# Patient Record
Sex: Female | Born: 1935 | Race: White | Hispanic: No | Smoking: Never smoker
Health system: Southern US, Community
[De-identification: ages and names within clinical notes are randomized; demographics above are authoritative.]

## PROBLEM LIST (undated history)

## (undated) DIAGNOSIS — D649 Anemia, unspecified: Secondary | ICD-10-CM

## (undated) DIAGNOSIS — I739 Peripheral vascular disease, unspecified: Secondary | ICD-10-CM

## (undated) DIAGNOSIS — I219 Acute myocardial infarction, unspecified: Secondary | ICD-10-CM

## (undated) DIAGNOSIS — E785 Hyperlipidemia, unspecified: Secondary | ICD-10-CM

## (undated) DIAGNOSIS — I10 Essential (primary) hypertension: Secondary | ICD-10-CM

## (undated) DIAGNOSIS — E039 Hypothyroidism, unspecified: Secondary | ICD-10-CM

## (undated) DIAGNOSIS — J45909 Unspecified asthma, uncomplicated: Secondary | ICD-10-CM

## (undated) DIAGNOSIS — Z8673 Personal history of transient ischemic attack (TIA), and cerebral infarction without residual deficits: Secondary | ICD-10-CM

## (undated) DIAGNOSIS — H9190 Unspecified hearing loss, unspecified ear: Secondary | ICD-10-CM

## (undated) DIAGNOSIS — I2510 Atherosclerotic heart disease of native coronary artery without angina pectoris: Secondary | ICD-10-CM

## (undated) DIAGNOSIS — E119 Type 2 diabetes mellitus without complications: Secondary | ICD-10-CM

## (undated) DIAGNOSIS — I4891 Unspecified atrial fibrillation: Secondary | ICD-10-CM

## (undated) DIAGNOSIS — I5032 Chronic diastolic (congestive) heart failure: Secondary | ICD-10-CM

## (undated) DIAGNOSIS — I779 Disorder of arteries and arterioles, unspecified: Secondary | ICD-10-CM

## (undated) DIAGNOSIS — J309 Allergic rhinitis, unspecified: Secondary | ICD-10-CM

## (undated) DIAGNOSIS — K219 Gastro-esophageal reflux disease without esophagitis: Secondary | ICD-10-CM

## (undated) DIAGNOSIS — F0390 Unspecified dementia without behavioral disturbance: Secondary | ICD-10-CM

## (undated) HISTORY — PX: ROTATOR CUFF REPAIR: SHX139

## (undated) HISTORY — DX: Allergic rhinitis, unspecified: J30.9

## (undated) HISTORY — DX: Gastro-esophageal reflux disease without esophagitis: K21.9

## (undated) HISTORY — PX: OTHER SURGICAL HISTORY: SHX169

## (undated) HISTORY — PX: RECTOCELE REPAIR: SHX761

## (undated) HISTORY — DX: Unspecified atrial fibrillation: I48.91

## (undated) HISTORY — DX: Essential (primary) hypertension: I10

## (undated) HISTORY — PX: HEMORRHOID SURGERY: SHX153

## (undated) HISTORY — DX: Unspecified hearing loss, unspecified ear: H91.90

## (undated) HISTORY — PX: DILATION AND CURETTAGE OF UTERUS: SHX78

## (undated) HISTORY — PX: EYE SURGERY: SHX253

## (undated) HISTORY — PX: TONSILLECTOMY: SUR1361

## (undated) HISTORY — PX: ABDOMINAL HYSTERECTOMY: SHX81

## (undated) HISTORY — DX: Type 2 diabetes mellitus without complications: E11.9

## (undated) HISTORY — DX: Acute myocardial infarction, unspecified: I21.9

## (undated) HISTORY — PX: PARTIAL HYSTERECTOMY: SHX80

---

## 1998-01-11 ENCOUNTER — Inpatient Hospital Stay (HOSPITAL_COMMUNITY): Admission: AD | Admit: 1998-01-11 | Discharge: 1998-01-12 | Payer: Self-pay | Admitting: Orthopedic Surgery

## 1998-06-07 ENCOUNTER — Other Ambulatory Visit: Admission: RE | Admit: 1998-06-07 | Discharge: 1998-06-07 | Payer: Self-pay | Admitting: Obstetrics and Gynecology

## 1998-09-09 HISTORY — PX: CORONARY ARTERY BYPASS GRAFT: SHX141

## 1999-06-13 ENCOUNTER — Other Ambulatory Visit: Admission: RE | Admit: 1999-06-13 | Discharge: 1999-06-13 | Payer: Self-pay | Admitting: Obstetrics and Gynecology

## 1999-09-06 ENCOUNTER — Inpatient Hospital Stay (HOSPITAL_COMMUNITY): Admission: AD | Admit: 1999-09-06 | Discharge: 1999-09-18 | Payer: Self-pay | Admitting: Cardiology

## 1999-09-07 ENCOUNTER — Encounter: Payer: Self-pay | Admitting: Cardiology

## 1999-09-07 ENCOUNTER — Encounter: Payer: Self-pay | Admitting: Cardiothoracic Surgery

## 1999-09-07 HISTORY — PX: CARDIAC CATHETERIZATION: SHX172

## 1999-09-08 ENCOUNTER — Encounter: Payer: Self-pay | Admitting: Cardiothoracic Surgery

## 1999-09-09 ENCOUNTER — Encounter: Payer: Self-pay | Admitting: Cardiothoracic Surgery

## 1999-09-10 ENCOUNTER — Encounter: Payer: Self-pay | Admitting: Cardiothoracic Surgery

## 1999-09-11 ENCOUNTER — Encounter: Payer: Self-pay | Admitting: General Surgery

## 1999-09-12 ENCOUNTER — Encounter: Payer: Self-pay | Admitting: Cardiothoracic Surgery

## 1999-09-16 ENCOUNTER — Encounter: Payer: Self-pay | Admitting: Thoracic Surgery (Cardiothoracic Vascular Surgery)

## 1999-09-28 ENCOUNTER — Encounter: Payer: Self-pay | Admitting: Cardiothoracic Surgery

## 1999-09-28 ENCOUNTER — Encounter: Admission: RE | Admit: 1999-09-28 | Discharge: 1999-09-28 | Payer: Self-pay | Admitting: Cardiothoracic Surgery

## 1999-10-06 ENCOUNTER — Encounter: Payer: Self-pay | Admitting: Emergency Medicine

## 1999-10-06 ENCOUNTER — Inpatient Hospital Stay (HOSPITAL_COMMUNITY): Admission: EM | Admit: 1999-10-06 | Discharge: 1999-10-11 | Payer: Self-pay | Admitting: *Deleted

## 1999-10-09 ENCOUNTER — Encounter: Payer: Self-pay | Admitting: Cardiology

## 2000-03-11 ENCOUNTER — Encounter: Payer: Self-pay | Admitting: Orthopedic Surgery

## 2000-03-14 ENCOUNTER — Inpatient Hospital Stay (HOSPITAL_COMMUNITY): Admission: RE | Admit: 2000-03-14 | Discharge: 2000-03-16 | Payer: Self-pay | Admitting: Orthopedic Surgery

## 2000-08-06 ENCOUNTER — Other Ambulatory Visit: Admission: RE | Admit: 2000-08-06 | Discharge: 2000-08-06 | Payer: Self-pay | Admitting: Obstetrics and Gynecology

## 2001-10-10 HISTORY — PX: CHOLECYSTECTOMY: SHX55

## 2001-10-29 ENCOUNTER — Encounter: Payer: Self-pay | Admitting: General Surgery

## 2001-10-29 ENCOUNTER — Observation Stay (HOSPITAL_COMMUNITY): Admission: RE | Admit: 2001-10-29 | Discharge: 2001-10-30 | Payer: Self-pay | Admitting: General Surgery

## 2001-10-29 ENCOUNTER — Encounter (INDEPENDENT_AMBULATORY_CARE_PROVIDER_SITE_OTHER): Payer: Self-pay | Admitting: Specialist

## 2004-02-13 ENCOUNTER — Encounter: Payer: Self-pay | Admitting: Family Medicine

## 2004-02-13 LAB — CONVERTED CEMR LAB: Hgb A1c MFr Bld: 5.1 %

## 2004-08-24 ENCOUNTER — Ambulatory Visit: Payer: Self-pay | Admitting: Family Medicine

## 2004-08-28 ENCOUNTER — Ambulatory Visit: Payer: Self-pay | Admitting: Family Medicine

## 2005-01-15 ENCOUNTER — Ambulatory Visit: Payer: Self-pay | Admitting: Internal Medicine

## 2005-01-17 HISTORY — PX: US ECHOCARDIOGRAPHY: HXRAD669

## 2005-02-28 ENCOUNTER — Ambulatory Visit: Payer: Self-pay | Admitting: Family Medicine

## 2005-07-17 ENCOUNTER — Ambulatory Visit: Payer: Self-pay | Admitting: Family Medicine

## 2005-07-24 ENCOUNTER — Ambulatory Visit: Payer: Self-pay | Admitting: Family Medicine

## 2005-08-07 ENCOUNTER — Ambulatory Visit: Payer: Self-pay | Admitting: Internal Medicine

## 2005-08-15 ENCOUNTER — Ambulatory Visit: Payer: Self-pay | Admitting: Internal Medicine

## 2006-02-20 ENCOUNTER — Ambulatory Visit: Payer: Self-pay | Admitting: Internal Medicine

## 2006-03-10 ENCOUNTER — Ambulatory Visit: Payer: Self-pay | Admitting: Family Medicine

## 2006-08-14 ENCOUNTER — Ambulatory Visit: Payer: Self-pay | Admitting: Internal Medicine

## 2006-08-26 ENCOUNTER — Ambulatory Visit: Payer: Self-pay | Admitting: Family Medicine

## 2006-10-17 ENCOUNTER — Encounter: Payer: Self-pay | Admitting: Family Medicine

## 2006-10-17 LAB — CONVERTED CEMR LAB: TSH: 0.938 microintl units/mL

## 2006-10-21 ENCOUNTER — Ambulatory Visit: Payer: Self-pay | Admitting: Internal Medicine

## 2007-06-23 ENCOUNTER — Encounter: Admission: RE | Admit: 2007-06-23 | Discharge: 2007-06-23 | Payer: Self-pay | Admitting: Cardiology

## 2007-06-30 HISTORY — PX: CARDIOVASCULAR STRESS TEST: SHX262

## 2007-07-13 ENCOUNTER — Ambulatory Visit: Payer: Self-pay | Admitting: Internal Medicine

## 2007-09-15 ENCOUNTER — Encounter: Payer: Self-pay | Admitting: Family Medicine

## 2007-09-15 DIAGNOSIS — E039 Hypothyroidism, unspecified: Secondary | ICD-10-CM

## 2007-09-15 DIAGNOSIS — H9190 Unspecified hearing loss, unspecified ear: Secondary | ICD-10-CM | POA: Insufficient documentation

## 2007-09-15 DIAGNOSIS — I4891 Unspecified atrial fibrillation: Secondary | ICD-10-CM | POA: Insufficient documentation

## 2007-09-15 DIAGNOSIS — Z8639 Personal history of other endocrine, nutritional and metabolic disease: Secondary | ICD-10-CM | POA: Insufficient documentation

## 2007-09-15 DIAGNOSIS — K219 Gastro-esophageal reflux disease without esophagitis: Secondary | ICD-10-CM | POA: Insufficient documentation

## 2007-09-15 DIAGNOSIS — E785 Hyperlipidemia, unspecified: Secondary | ICD-10-CM | POA: Insufficient documentation

## 2007-09-15 DIAGNOSIS — J309 Allergic rhinitis, unspecified: Secondary | ICD-10-CM | POA: Insufficient documentation

## 2007-09-15 DIAGNOSIS — I2510 Atherosclerotic heart disease of native coronary artery without angina pectoris: Secondary | ICD-10-CM

## 2007-09-15 DIAGNOSIS — I509 Heart failure, unspecified: Secondary | ICD-10-CM

## 2007-09-15 DIAGNOSIS — J45909 Unspecified asthma, uncomplicated: Secondary | ICD-10-CM | POA: Insufficient documentation

## 2007-09-16 ENCOUNTER — Ambulatory Visit: Payer: Self-pay | Admitting: Family Medicine

## 2007-09-16 DIAGNOSIS — J069 Acute upper respiratory infection, unspecified: Secondary | ICD-10-CM | POA: Insufficient documentation

## 2007-09-22 ENCOUNTER — Ambulatory Visit: Payer: Self-pay | Admitting: Internal Medicine

## 2007-09-30 ENCOUNTER — Ambulatory Visit: Payer: Self-pay | Admitting: Family Medicine

## 2007-10-28 ENCOUNTER — Encounter: Payer: Self-pay | Admitting: Family Medicine

## 2007-11-18 ENCOUNTER — Ambulatory Visit: Payer: Self-pay | Admitting: Internal Medicine

## 2007-11-24 ENCOUNTER — Telehealth: Payer: Self-pay | Admitting: Internal Medicine

## 2007-12-14 ENCOUNTER — Ambulatory Visit: Payer: Self-pay | Admitting: Internal Medicine

## 2008-02-23 ENCOUNTER — Ambulatory Visit: Payer: Self-pay | Admitting: Family Medicine

## 2008-03-02 ENCOUNTER — Encounter (INDEPENDENT_AMBULATORY_CARE_PROVIDER_SITE_OTHER): Payer: Self-pay | Admitting: Internal Medicine

## 2008-03-03 ENCOUNTER — Encounter (INDEPENDENT_AMBULATORY_CARE_PROVIDER_SITE_OTHER): Payer: Self-pay | Admitting: Internal Medicine

## 2008-06-21 ENCOUNTER — Encounter: Payer: Self-pay | Admitting: Family Medicine

## 2008-06-23 ENCOUNTER — Ambulatory Visit: Payer: Self-pay | Admitting: Internal Medicine

## 2008-06-23 ENCOUNTER — Encounter: Admission: RE | Admit: 2008-06-23 | Discharge: 2008-06-23 | Payer: Self-pay | Admitting: Cardiology

## 2008-06-23 LAB — CONVERTED CEMR LAB
Basophils Absolute: 0.1 10*3/uL (ref 0.0–0.1)
Basophils Relative: 0.8 % (ref 0.0–3.0)
Eosinophils Absolute: 0.2 10*3/uL (ref 0.0–0.7)
Eosinophils Relative: 1.9 % (ref 0.0–5.0)
HCT: 36.3 % (ref 36.0–46.0)
Hemoglobin: 12.4 g/dL (ref 12.0–15.0)
Lymphocytes Relative: 35.5 % (ref 12.0–46.0)
MCHC: 34.1 g/dL (ref 30.0–36.0)
MCV: 85.5 fL (ref 78.0–100.0)
Monocytes Absolute: 0.9 10*3/uL (ref 0.1–1.0)
Monocytes Relative: 10.4 % (ref 3.0–12.0)
Neutro Abs: 4.6 10*3/uL (ref 1.4–7.7)
Neutrophils Relative %: 51.4 % (ref 43.0–77.0)
Platelets: 180 10*3/uL (ref 150–400)
RBC: 4.25 M/uL (ref 3.87–5.11)
RDW: 13 % (ref 11.5–14.6)
WBC: 9 10*3/uL (ref 4.5–10.5)

## 2008-07-04 ENCOUNTER — Encounter: Admission: RE | Admit: 2008-07-04 | Discharge: 2008-07-04 | Payer: Self-pay | Admitting: Cardiology

## 2008-07-15 ENCOUNTER — Encounter: Payer: Self-pay | Admitting: Internal Medicine

## 2008-07-21 ENCOUNTER — Telehealth (INDEPENDENT_AMBULATORY_CARE_PROVIDER_SITE_OTHER): Payer: Self-pay | Admitting: *Deleted

## 2008-09-29 ENCOUNTER — Ambulatory Visit: Payer: Self-pay | Admitting: Internal Medicine

## 2008-11-23 ENCOUNTER — Ambulatory Visit: Payer: Self-pay | Admitting: Internal Medicine

## 2008-11-23 DIAGNOSIS — J018 Other acute sinusitis: Secondary | ICD-10-CM

## 2008-11-25 ENCOUNTER — Telehealth (INDEPENDENT_AMBULATORY_CARE_PROVIDER_SITE_OTHER): Payer: Self-pay | Admitting: *Deleted

## 2009-01-12 ENCOUNTER — Ambulatory Visit: Payer: Self-pay | Admitting: Family Medicine

## 2009-01-27 ENCOUNTER — Encounter: Admission: RE | Admit: 2009-01-27 | Discharge: 2009-01-27 | Payer: Self-pay | Admitting: Cardiology

## 2009-04-23 ENCOUNTER — Encounter: Admission: RE | Admit: 2009-04-23 | Discharge: 2009-04-23 | Payer: Self-pay | Admitting: Neurology

## 2009-06-22 ENCOUNTER — Ambulatory Visit: Payer: Self-pay | Admitting: Internal Medicine

## 2009-06-27 ENCOUNTER — Encounter: Admission: RE | Admit: 2009-06-27 | Discharge: 2009-06-27 | Payer: Self-pay | Admitting: Cardiology

## 2009-11-03 ENCOUNTER — Telehealth (INDEPENDENT_AMBULATORY_CARE_PROVIDER_SITE_OTHER): Payer: Self-pay | Admitting: *Deleted

## 2009-11-09 ENCOUNTER — Encounter: Admission: RE | Admit: 2009-11-09 | Discharge: 2009-11-09 | Payer: Self-pay | Admitting: Cardiology

## 2010-02-22 ENCOUNTER — Telehealth (INDEPENDENT_AMBULATORY_CARE_PROVIDER_SITE_OTHER): Payer: Self-pay | Admitting: *Deleted

## 2010-05-10 ENCOUNTER — Ambulatory Visit: Payer: Self-pay | Admitting: Internal Medicine

## 2010-06-11 ENCOUNTER — Ambulatory Visit: Payer: Self-pay | Admitting: Cardiology

## 2010-06-11 ENCOUNTER — Ambulatory Visit: Payer: Self-pay | Admitting: Cardiovascular Disease

## 2010-06-22 ENCOUNTER — Ambulatory Visit: Payer: Self-pay | Admitting: Internal Medicine

## 2010-07-13 ENCOUNTER — Ambulatory Visit: Payer: Self-pay | Admitting: Cardiology

## 2010-08-03 ENCOUNTER — Encounter: Admission: RE | Admit: 2010-08-03 | Discharge: 2010-08-03 | Payer: Self-pay | Admitting: Cardiology

## 2010-10-09 NOTE — Assessment & Plan Note (Signed)
Summary: rov 1 yr ///kp   Primary Provider/Referring Provider:  Patty Sermons  CC:  Yearly follow up visit-asthma; Breathing is good per patient..  History of Present Illness: 06/23/08- Thinks if she takes Centrum Silver vitamin, or eats her own homemade applesauce she gets a burning sensation, bilateral axillary/ ribs. No GI/GU or chest pain/palpitation otherwise. Denies sneeze, rhinorhea, wheeze or cough. Continues allergy vaccine 1:10 every 2 weeks.  11/23/08-Asthmatic bronchitis, rhinits, CAD/CHF/AF 4-5 days hard left frontal and maxillary headache, watery rhinorhea. Ears ok, no fever or purulent discharge. Chest feels normal for her. She is convinced allergy vaccine helps her.  June 24, 2009- Asthmatic bronchitis, rhinitis, CAD. CHF Pain behind right ear since [place]. Has had ENT and carotid artery eval. Hearing aids. Discussed flu shot. Hauled dirt yesterday- still active. Morning cough with phlegm.  Jun 24, 2010-  Asthmatic bronchitis, rhinitis, CAD/ MI. CHF Nurse:cc Yearly follow up visit-asthma; Breathing is good per patient. Says she has been doing well recently. She had an MI in February/ Dr Patty Sermons. Ribs get sore when shes tired. Then she tells me she picked up 20 bushels or walnuts- showing me her stained hands. Takes an antihistamine but doesn't know what it is- says prescription.  Had mold in home- remediated. No asthma or inhaler. Sometimes morning cough. Nurse neighbor still gives allergy vaccine every 2 weeks. Discussed Epipen. Feels a little bump in right external ear canal- worried because friend had a cancer there.   Asthma History    Initial Asthma Severity Rating:    Age range: 12+ years    Symptoms: 0-2 days/week    Nighttime Awakenings: 0-2/month    Interferes w/ normal activity: no limitations    SABA use (not for EIB): 0-2 days/week    Asthma Severity Assessment: Intermittent   Preventive Screening-Counseling & Management  Alcohol-Tobacco  Smoking Status: never  Current Medications (verified): 1)  Synthroid 125 Mcg  Tabs (Levothyroxine Sodium) .... One By Mouth Once Daily 2)  Atenolol 25 Mg  Tabs (Atenolol) .... One Half By Mouth Once Daily 3)  Protonix 40 Mg  Tbec (Pantoprazole Sodium) .... One By Mouth Once Daily 4)  Furosemide 40 Mg  Tabs (Furosemide) .... One By Mouth Once Daily 5)  Zocor 20 Mg Tabs (Simvastatin) .... Take 1 Tablet By Mouth Once A Day 6)  Adult Aspirin Ec Low Strength 81 Mg  Tbec (Aspirin) .... One By Mouth Daily 7)  Lanoxin 0.125 Mg  Tabs (Digoxin) .... One By Mouth Once Daily 8)  Allegra 60 Mg  Tabs (Fexofenadine Hcl) .... One Two Times A Day As Needed 9)  Allergy Vaccine 1:10 Nurse Neighbor (W-E) .... Every 2 Weeks 10)  Epi Pen .... As Needed 11)  Astelin 137 Mcg/spray  Soln (Azelastine Hcl) .... 2 Puffs Two Times A Day 12)  Bd Insulin Syringe Microfine 28g X 1/2" 1 Ml Misc (Insulin Syringe-Needle U-100) .... Use As Directed 13)  Vitamin B-6 Cr 200 Mg Cr-Tabs (Pyridoxine Hcl) .... Take 1 By Mouth Once Daily 14)  Vitamin C 1000 Mg Tabs (Ascorbic Acid) .... Take 1 By Mouth Once Daily  Allergies (verified): 1)  ! Penicillin  Past History:  Past Surgical History: Last updated: 09/15/2007 Tonsillectomy Hysterectomy- partial D & C Bladder tack x 2 Rectocele x 2 Cholecystectomy (10/2001) Rotator cuff repair Coronary artery bypass graft- 4 vessel (2000) Dexa- osteopenia fem neck (1998) Hemorrhoidectomy  Family History: Last updated: 24-Jun-2009 Father- died MI Mother- died unknown cause  Social History: Last updated: 09/15/2007 Marital Status  widowed Children: 4 sons Occupation: retired  Risk Factors: Smoking Status: never (06/22/2010)  Past Medical History: URI (ICD-465.9) HEARING IMPAIRMENT (ICD-389.9) HYPOTHYROIDISM (ICD-244.9) HYPERLIPIDEMIA (ICD-272.4) GERD (ICD-530.81) DIABETES MELLITUS, TYPE II (ICD-250.00) CORONARY ARTERY DISEASE (ICD-414.00) - CABG 4v 2000 MI- 2000,  2011 CONGESTIVE HEART FAILURE (ICD-428.0) ATRIAL FIBRILLATION (ICD-427.31) ASTHMA (ICD-493.90) ALLERGIC RHINITIS (ICD-477.9)  Review of Systems      See HPI       The patient complains of non-productive cough.  The patient denies shortness of breath with activity, shortness of breath at rest, productive cough, coughing up blood, chest pain, irregular heartbeats, acid heartburn, indigestion, loss of appetite, weight change, abdominal pain, difficulty swallowing, sore throat, tooth/dental problems, headaches, nasal congestion/difficulty breathing through nose, and sneezing.    Vital Signs:  Patient profile:   77 year old female Height:      64 inches Weight:      126.38 pounds BMI:     21.77 O2 Sat:      99 % on Room air Pulse rate:   63 / minute BP sitting:   112 / 76  (left arm) Cuff size:   regular  Vitals Entered By: Reynaldo Minium CMA (June 22, 2010 1:30 PM)  O2 Flow:  Room air CC: Yearly follow up visit-asthma; Breathing is good per patient.   Physical Exam  Additional Exam:  General: A/Ox3; pleasant and cooperative, NAD, very alert- mentally younger than stated age, talkative SKIN: no rash, lesions NODES: no lymphadenopathy HEENT: [place]/AT, EOM- WNL, Conjuctivae- minor bilateral injection, PERRLA, TM-WNL. I find a small retention cyst/ whitehead in right auricle that seems to be what she is referring to. , Nose- moderate congestion, sniffing, Throat- clear and wnl, Melampatti II, no postnasal drip NECK: Supple w/ fair ROM, JVD- none, left carotid bruit Thyroid- normal to palpation CHEST: Clear to P&A, sternotomy scar HEART: RRR, no m/g/r heard ABDOMEN:normal weight ZHY:QMVH, nl pulses, no edema  NEURO: Grossly intact to observation     Impression & Recommendations:  Problem # 1:  ALLERGIC RHINITIS (ICD-477.9)  Generally good control. She wants to continue allergy shots and occasional antihistamine. We discussed risk/ benefti and Epipen, changing due to age to  [employer]. Her updated medication list for this problem includes:    Allegra 60 Mg Tabs (Fexofenadine hcl) ..... One two times a day as needed    Astelin 137 Mcg/spray Soln (Azelastine hcl) .[name]... 2 puffs two times a day  Problem # 2:  ASTHMA (ICD-493.90) Good control w/o wheeze. I don't think she needs a rescue inhaler.   Problem # 3:  CORONARY ARTERY DISEASE (ICD-414.00) We discussed her report of MI in the late winter and interaction between heart and lungs.   Her updated medication list for this problem includes:    Atenolol 25 Mg Tabs (Atenolol) ..... One half by mouth once daily    Furosemide 40 Mg Tabs (Furosemide) ..... One by mouth once daily    Adult Aspirin Ec Low Strength 81 Mg Tbec (Aspirin) ..... One by mouth daily  Medications Added to Medication List This Visit: 1)  Epipen Jr 0.15 Mg/0.44ml Devi (Epinephrine) .... For severe allergic reaction 2)  Vitamin B-6 Cr 200 Mg Cr-tabs (Pyridoxine hcl) .... Take 1 by mouth once daily 3)  Vitamin C 1000 Mg Tabs (Ascorbic acid) .... Take 1 by mouth once daily  Other Orders: Est. Patient Level III (84696) Flu Vaccine 5yrs + MEDICARE PATIENTS (E9528) Administration Flu vaccine - MCR (U1324)  Patient Instructions: 1)  Please  schedule a follow-up appointment in 1 year. 2)  Flu vax 3)  Update script for Epipen Montez Hageman to keep where you get your allergy shots, in case you ever do have a severe allergic reaction to an allergy shot.  Prescriptions: EPIPEN JR 0.15 MG/0.3ML DEVI (EPINEPHRINE) For severe allergic reaction  #1 x prn   Entered and Authorized by:   Waymon Budge MD   Signed by:   Waymon Budge MD on 06/22/2010   Method used:   Print then Give to Patient   RxID:   [id number]    Flu Vaccine Consent Questions     Do you have a history of severe allergic reactions to this vaccine? no    Any prior history of allergic reactions to egg and/or gelatin? no    Do you have a sensitivity to the preservative Thimersol? no     Do you have a past history of Guillan-Barre Syndrome? no    Do you currently have an acute febrile illness? no    Have you ever had a severe reaction to latex? no    Vaccine information given and explained to patient? yes    Are you currently pregnant? no    Lot Number:[id number]   Exp Date:03/09/2011   Site Given  Left Deltoid IMdflu Gweneth Dimitri RN  June 22, 2010 2:08 PM

## 2010-10-09 NOTE — Progress Notes (Signed)
Summary: prescription  Phone Note From Pharmacy Call back at [phone]   [facility] [phone][facility] Call For: young  Summary of Call: Need rx for allergy syringes 28 gauge 1/2 in 7mm 1ml qty 100 last filled 2006. Initial call taken by: Darletta Moll,  February 22, 2010 3:14 PM    New/Updated Medications: BD INSULIN SYRINGE MICROFINE 28G X 1/2" 1 ML MISC (INSULIN SYRINGE-NEEDLE U-100) use as directed Prescriptions: BD INSULIN SYRINGE MICROFINE 28G X 1/2" 1 ML MISC (INSULIN SYRINGE-NEEDLE U-100) use as directed  #100 x 3   Entered by:   Vernie Murders   Authorized by:   Waymon Budge MD   Signed by:   Vernie Murders on 02/22/2010   Method used:   [facility]ronically to        [facility] [phone]* (retail)       [address] [place], [place]  [address]       Ph: [phone] or [phone]       Fax: [phone]   RxID:   [id number]

## 2010-10-09 NOTE — Progress Notes (Signed)
Summary: appt  Phone Note Call from Patient Call back at Home Phone [phone]   Caller: Patient Call For: young Reason for Call: Talk to Nurse Summary of Call: fever, cough(getting up plegm), nose running, would like to be seen if possible. [facility] Initial call taken by: Eugene Gavia,  November 03, 2009 8:19 AM  Follow-up for Phone Call        pt c/o productive cough with yellow phlegm, chst congestion, fever of 100, and nasal drainage x 2 days. Pt states she has had PNA several times inthe past and is concerned about this turning into that. Please advise. Carron Curie CMA  November 03, 2009 9:31 AM allergies: PCN  Additional Follow-up for Phone Call Additional follow up Details #1::        Offer Z pak Additional Follow-up by: Waymon Budge MD,  November 03, 2009 1:45 PM    Additional Follow-up for Phone Call Additional follow up Details #2::    ATC pt's home number-line busy.  Will retry later.  Gweneth Dimitri RN  November 03, 2009 1:54 PM  Spoke with pt and made aware zpack being sent to pharm. Vernie Murders  November 03, 2009 2:04 PM   New/Updated Medications: ZITHROMAX Z-PAK 250 MG TABS (AZITHROMYCIN) take as directed Prescriptions: ZITHROMAX Z-PAK 250 MG TABS (AZITHROMYCIN) take as directed  #1 x 0   Entered by:   Vernie Murders   Authorized by:   Waymon Budge MD   Signed by:   Vernie Murders on 11/03/2009   Method used:   Electronically to        [facility] [phone]* (retail)       [address] [place], [place]  [address]       Ph: [phone] or [phone]       Fax: [phone]   RxID:   [id number]

## 2010-11-15 ENCOUNTER — Ambulatory Visit (INDEPENDENT_AMBULATORY_CARE_PROVIDER_SITE_OTHER): Payer: Medicare Other | Admitting: Cardiology

## 2010-11-15 DIAGNOSIS — E7800 Pure hypercholesterolemia, unspecified: Secondary | ICD-10-CM

## 2010-11-15 DIAGNOSIS — Z951 Presence of aortocoronary bypass graft: Secondary | ICD-10-CM

## 2010-11-15 DIAGNOSIS — Z79899 Other long term (current) drug therapy: Secondary | ICD-10-CM

## 2010-11-15 DIAGNOSIS — I2510 Atherosclerotic heart disease of native coronary artery without angina pectoris: Secondary | ICD-10-CM

## 2010-12-15 ENCOUNTER — Emergency Department (HOSPITAL_COMMUNITY)
Admission: EM | Admit: 2010-12-15 | Discharge: 2010-12-15 | Disposition: A | Discharge status: Home / Self Care | Payer: Medicare Other | Attending: Emergency Medicine | Admitting: Emergency Medicine

## 2010-12-15 ENCOUNTER — Emergency Department (HOSPITAL_COMMUNITY): Payer: Medicare Other

## 2010-12-15 DIAGNOSIS — Z79899 Other long term (current) drug therapy: Secondary | ICD-10-CM | POA: Insufficient documentation

## 2010-12-15 DIAGNOSIS — R5381 Other malaise: Secondary | ICD-10-CM | POA: Insufficient documentation

## 2010-12-15 DIAGNOSIS — E119 Type 2 diabetes mellitus without complications: Secondary | ICD-10-CM | POA: Insufficient documentation

## 2010-12-15 DIAGNOSIS — R05 Cough: Secondary | ICD-10-CM | POA: Insufficient documentation

## 2010-12-15 DIAGNOSIS — E860 Dehydration: Secondary | ICD-10-CM | POA: Insufficient documentation

## 2010-12-15 DIAGNOSIS — I10 Essential (primary) hypertension: Secondary | ICD-10-CM | POA: Insufficient documentation

## 2010-12-15 DIAGNOSIS — R42 Dizziness and giddiness: Secondary | ICD-10-CM | POA: Insufficient documentation

## 2010-12-15 DIAGNOSIS — R059 Cough, unspecified: Secondary | ICD-10-CM | POA: Insufficient documentation

## 2010-12-15 DIAGNOSIS — R112 Nausea with vomiting, unspecified: Secondary | ICD-10-CM | POA: Insufficient documentation

## 2010-12-15 DIAGNOSIS — R197 Diarrhea, unspecified: Secondary | ICD-10-CM | POA: Insufficient documentation

## 2010-12-15 DIAGNOSIS — I2510 Atherosclerotic heart disease of native coronary artery without angina pectoris: Secondary | ICD-10-CM | POA: Insufficient documentation

## 2010-12-15 DIAGNOSIS — E789 Disorder of lipoprotein metabolism, unspecified: Secondary | ICD-10-CM | POA: Insufficient documentation

## 2010-12-15 LAB — DIFFERENTIAL
Basophils Relative: 0 % (ref 0–1)
Eosinophils Absolute: 0 10*3/uL (ref 0.0–0.7)
Lymphs Abs: 1 10*3/uL (ref 0.7–4.0)
Monocytes Absolute: 1.5 10*3/uL — ABNORMAL HIGH (ref 0.1–1.0)
Neutro Abs: 15.7 10*3/uL — ABNORMAL HIGH (ref 1.7–7.7)
Neutrophils Relative %: 86 % — ABNORMAL HIGH (ref 43–77)

## 2010-12-15 LAB — URINALYSIS, ROUTINE W REFLEX MICROSCOPIC
Bilirubin Urine: NEGATIVE
Glucose, UA: NEGATIVE mg/dL
Hgb urine dipstick: NEGATIVE
Nitrite: NEGATIVE
Protein, ur: NEGATIVE mg/dL
Specific Gravity, Urine: 1.017 (ref 1.005–1.030)
pH: 5.5 (ref 5.0–8.0)

## 2010-12-15 LAB — CBC
Hemoglobin: 13.7 g/dL (ref 12.0–15.0)
RBC: 4.58 MIL/uL (ref 3.87–5.11)
RDW: 14.2 % (ref 11.5–15.5)

## 2010-12-15 LAB — COMPREHENSIVE METABOLIC PANEL
ALT: 22 U/L (ref 0–35)
Alkaline Phosphatase: 66 U/L (ref 39–117)
Calcium: 8.3 mg/dL — ABNORMAL LOW (ref 8.4–10.5)
GFR calc Af Amer: 52 mL/min — ABNORMAL LOW (ref >60)
GFR calc non Af Amer: 43 mL/min — ABNORMAL LOW (ref >60)
Glucose, Bld: 157 mg/dL — ABNORMAL HIGH (ref 70–99)
Sodium: 139 mEq/L (ref 135–145)
Total Bilirubin: 0.9 mg/dL (ref 0.3–1.2)

## 2010-12-16 LAB — URINE CULTURE: Culture  Setup Time: 201204080013

## 2010-12-18 NOTE — Progress Notes (Signed)
Quick Note:  Dr. Royden Purl patient. ______

## 2010-12-26 ENCOUNTER — Other Ambulatory Visit: Payer: Self-pay | Admitting: *Deleted

## 2010-12-26 MED ORDER — SIMVASTATIN 40 MG PO TABS: 40.0000 mg | ORAL_TABLET | Freq: Every evening | ORAL | Status: DC

## 2010-12-26 NOTE — Telephone Encounter (Signed)
Refilled meds per fax request.  

## 2011-01-17 ENCOUNTER — Other Ambulatory Visit: Payer: Self-pay | Admitting: Cardiology

## 2011-01-17 DIAGNOSIS — I10 Essential (primary) hypertension: Secondary | ICD-10-CM

## 2011-01-17 NOTE — Telephone Encounter (Signed)
Faxed refill for atenolol 25mg . [facility] [phone] [facility]

## 2011-01-22 NOTE — Assessment & Plan Note (Signed)
[facility]                             PULMONARY OFFICE NOTE   NAME:MAYAudryna, Jill Howell                          MRN:          [id number]  DATE:07/13/2007                            DOB:          1919-06-19    PROBLEM:  1. Asthmatic bronchitis.  2. Allergic rhinitis.  3. Coronary disease/bypass graft/congestive heart failure.   HISTORY:  Complains of nasal congestion and cough over the past 6 days,  making her ribs a little sore.  Low-grade fever, scratchy throat,  gargling helped.  Temperature maxed at 100.8 but has come down.  She  continues allergy vaccine every 2 weeks with no problems.   MEDICATION:  1. Allergy vaccine.  2. Synthroid 125 mcg.  3. Atenolol 1/2 x25 mg.  4. Lipitor 20 mg.  5. Furosemide 40 mg.  6. Aspirin 81 mg.  7. Protonix 40 mg.  8. Lanoxin 125 mcg.  9. Potassium.  10.PRN use of a rescue albuterol inhaler.  11.Allegra 60 mg.  12.Nitroglycerin.  13.She does have an EpiPen.   DRUG INTOLERANT PENICILLIN.   OBJECTIVE:  Weight 134 pounds, BP 126/60, pulse 69, room air saturation  96%.  PHARYNX:  Red.  CHEST:  Sounds clear, I do not find adenopathy.  HEART SOUNDS:  Regular without murmur.   IMPRESSION:  1. Probable viral syndrome with exacerbation of rhinitis and asthmatic      bronchitis.  2. Background history of allergic rhinitis.   PLAN:  1. Biaxin 500 mg b.i.d. for 7 days, fluids, etc.  2. Schedule return in 1 year, earlier p.r.n.     [name] D. Maple Hudson, MD, Tonny Bollman, FACP  Electronically Signed    CDY/MedQ  DD: 07/18/2007  DT: 07/19/2007  Job #: [id number]   cc:   Jill A. Milinda Antis, MD  Cassell Clement, M.D.

## 2011-01-24 ENCOUNTER — Other Ambulatory Visit: Payer: Self-pay | Admitting: Cardiology

## 2011-01-24 DIAGNOSIS — I499 Cardiac arrhythmia, unspecified: Secondary | ICD-10-CM

## 2011-01-24 NOTE — Telephone Encounter (Signed)
escribe request  

## 2011-01-25 NOTE — Op Note (Signed)
[facility]  Patient:    Jill Howell, Jill Howell. Visit Number: [id number] MRN: [id number]          Service Type: Attending:  Anselm Pancoast. Zachery Dakins, M.D. Dictated by:   Anselm Pancoast. Zachery Dakins, M.D. Proc. Date: 10/29/01   CC:         Thomas A. Patty Sermons, M.D.   Operative Report  PREOPERATIVE DIAGNOSES: 1. Chronic cholecystitis with recent biliary attacks. 2. Gallstones.  POSTOPERATIVE DIAGNOSES: 1. Chronic cholecystitis with recent biliary attacks. 2. Gallstones.  OPERATION:  Laparoscopic cholecystectomy with cholangiogram.  SURGEON:  Anselm Pancoast. Zachery Dakins, M.D.  ASSISTANT:  Gita Kudo, M.D.  ANESTHESIA:  General.  HISTORY:  Jill Howell is an 77 year old Caucasian female, referred to me by Dr. Ronny Flurry for management of symptomatic gallstones.  The patient, about 2-3 years ago had coronary artery bypass surgery.  She still had little episodes of epigastric pain and recently had a more intense episode that she saw a physician friend at church, and he said he thought that she was having a gallbladder attack.  She saw Dr. Patty Sermons, her cardiologist, who recommended they obtain an ultrasound.  This does confirm that she has multiple small gallstones.  Her liver function studies were normal preoperatively, and she is thought to be stable from a cardiac standpoint.  Therefore, we recommend to proceed with a laparoscopic cholecystectomy with cholangiogram.  DESCRIPTION OF PROCEDURE:  The patient was taken to the operative suite, given 3 g of Unasyn, has PAS stockings, induction of general anesthesia endotracheal tube.  The abdomen was prepped with Betadine surgical scrub and solution.  She had had a lower midline incision from a GYN procedure, and she has had a coronary artery bypass sternotomy incision.  I made a little incision just below the umbilicus.  She has a thin abdominal wall, opened through the fascia and then carefully dissected the omentum  up superiorly to get into the free area.  A pursestring suture of 0 Vicryl was placed and the Hasson cannula introduced.  There were few adhesions in the upper abdomen.  The upper 10 mm trocar was placed under direct vision at the base of the sternotomy incision, and then the two lateral 5 mm trocars were placed by Dr. Maryagnes Amos laterally.  The gallbladder was not acutely inflamed but was retracted upper and outward.  The cystic artery was dissected free from the cystic duct, and this was triply clipped proximally, singly distally, and divided, and then the cystic duct which was a fairly prominent size was dissected free circumferentially and then clipped.  I then made a small opening, and there were numerous little 1-2 mm, black, bilirubin-type stones within this which were milked back.  Then after the bile was coming back, we placed the [facility] and held in place with a clip.  X-ray was obtained.  There was good, prompt filling of the extrahepatic biliary system.  There Domeier be one little teeny millimeter-type defect in the distal common bile duct, but it should pass if it is a stone.  We removed the catheter, triply clipped the cystic duct, divided it, and then removed the gallbladder with the hook electrocautery. Good hemostasis and then the gallbladder was placed in an EndoCatch bag and brought out through the umbilicus.  We thoroughly irrigated.  No evidence of any bleeding.  This was aspirated and then the Hasson cannula was removed. The pursestring suture was tied.  I did place a second figure-of-eight suture of 0 Vicryl in the fascia,  and then the two lateral 5 mm trocars were withdrawn.  The carbon dioxide was released, and then the upper fascia was closed with figure-of-eight 0 Vicryl, subcutaneous wounds 4-0 Vicryl closure, and then Benzoin and Steri-Strips on the skin.  The patient tolerated the procedure nicely and was extubated and sent to the recovery room in a  stable postop condition. Dictated by:   Anselm Pancoast. Zachery Dakins, M.D. Attending:  Anselm Pancoast. Zachery Dakins, M.D. DD:  10/29/01 TD:  10/29/01 Job: [id number]

## 2011-01-25 NOTE — Discharge Summary (Signed)
[facility]  Patient:    Jill Howell                           MRN: [id number] Adm. Date:  [id number] Disc. Date: 09/18/99 Attending:  Mikey Bussing Dictator:   Lissa Merlin, P.A.                           Discharge Summary  DATE OF BIRTH:  2019-03-16  CARDIOLOGIST:  Peter M. Swaziland, M.D.  ADMISSION DIAGNOSES: 1. Unstable angina (no history of previous coronary artery disease). 2. Hyperlipidemia. 3. Hypothyroidism. 4. Gastroesophageal reflux disease. 5. Recent onset of atrial fibrillation two weeks before admission (no history of    hypertension, diabetes, or tobacco use). 6. Positive family history of coronary artery disease.  DISCHARGE DIAGNOSES:  1. Postoperative productive cough.  2. Coronary artery disease.  3. Hyperlipidemia.  4. History of unstable angina.  5. Post catheterization atrial fibrillation and balloon pump.  6. Hypothyroidism.  7. Gastroesophageal reflux disease.  8. History of atrial fibrillation.  9. Deconditioning postoperatively. 10. Postoperative anemia.  PROCEDURES: 1. Cardiac catheterization on September 07, 1999, which indicated critical left ain    and three-vessel obstructive coronary artery disease.  It also showed severe    left ventricular dysfunction.  There was moderate mitral insufficiency. There    was IABP inserted post catheterization for atrial fibrillation and    electrocardiographic changes with ischemic mitral regurgitation.  The    catheterization also showed normal right heart pressures. 2. Emergency coronary artery bypass grafting x 4 on September 07, 1999, with the    following grafts:  Left internal mammary artery to left anterior descending,    saphenous vein graft to diagonal, saphenous vein graft to obtuse marginal,    saphenous vein graft to posterior descending.  HISTORY OF PRESENT ILLNESS:  This is a pleasant 77 year old female with no previous cardiac history, who was evaluated  recently by her PMD in [place], [place], for new onset chest pain.  She had an ECG and a treadmill and [place] have had an echocardiogram.  She was then referred for Cardiolite study by Maisie Fus A.  Brackbill, M.D., which indicated positive ischemia and an EF of 29%.  She continued with chest pain, so she was admitted on September 06, 1999, for medical stabilization with IV heparin, nitroglycerin, and aspirin.  HOSPITAL COURSE:  Her Covera was stopped and a beta blocker was begun.  Cardiac  catheterization on September 07, 1999, was done and indicated severe left main and three-vessel obstructive CAD.  The catheterization was complicated by ECG changes, atrial fibrillation, cardiogenic shock, and ischemic mitral regurgitation which  necessitated IABP placement.  After stabilization, an emergent CABG was done. There were no complications with the CABG.  On postoperative day #1, the patient was stable.  She was started on digoxin for her atrial fibrillation history. She was treated for postoperative anemia with a transfusion of one unit of packed red blood cells.  The IABP was out on September 08, 1999.  She had multiple PACs. Digoxin and Cardizem were started.  Her hematocrit was 26.  On September 10, 1999,  there were rales noted on physical examination.  The patient was ambulated.  On  September 11, 1999, she was transferred to step-down, weak, but stable.  She was put on antibiotics for her adventitious lung sounds.  She was noted to  be making slow progress.  On September 14, 1999, she was transferred to unit 2000.  Diuresis and incentive spirometry were continued.  She was noted to continue to be making slow steady progress, walking with rehabilitation.  A home health R.N. and home health aid were arranged for future discharge.  On September 16, 1999, her H&H was 8.9/26.0, she was afebrile, she was 94% on room air, her lungs sounded better, and she was walking with rehabilitation.   On September 17, 1999, she is currently clinically stable and if she continues to be so in the morning, then she will be discharged.  DISCHARGE MEDICATIONS:  1. Lanoxin 0.125 mg one p.o. q.d.  2. Tenormin 25 mg tablet one-half tablet b.i.d.  3. Zantac 150 mg one p.o. b.i.d.  4. Synthroid 100 mcg ne daily.  5. Enteric-coated aspirin 325 mg p.o. q.d.  6. Levaquin 500 mg tablet one p.o. q.d. x 5 more days.  SPECIAL INSTRUCTIONS:  The patient was instructed to not engage in any strenuous activities, such as lifting or stretching.  She was told to walk daily.  She was told that she could shower.  She was told to maintain a low-fat, low-cholesterol diet.  She was instructed to keep her wounds clean and dry and to use soap and water only.  She was told to get a chest x-ray at Dr. Mervyn Gay office when she sees him in two weeks.  FOLLOW-UP:  1. The patient will return to the office in around five days for staple/wound check.  The office will call to make this appointment.  2. The patient is instructed to call Dr. Mervyn Gay office to arrange a two-week follow-up visit and to get a chest x-ray at that time.  3. A three-week follow-up appointment will be made with Mikey Bussing, M.D., when the patient comes in for her staple check.  CONDITION ON DISCHARGE:  The patient is currently clinically stable for discharge in the morning pending satisfactory morning rounds, physical exam, vital signs, and laboratory work. DD:  09/17/99 TD:  09/17/99 Job: [id number]

## 2011-01-25 NOTE — Op Note (Signed)
[place]. [facility]  Patient:    Jill Howell, Jill Howell                          MRN: [id number] Proc. Date: 03/14/00 Adm. Date:  [id number] Attending:  Ollen Gross V                           Operative Report  PREOPERATIVE DIAGNOSIS:  Left shoulder impingement syndrome, acromioclavicular ligament versus rotator cuff tear.  POSTOPERATIVE DIAGNOSIS:  Left shoulder impingement syndrome, acromioclavicular ligament versus rotator cuff tear.  OPERATION:  Left open subacromial decompression with distal clavicle resection and rotator cuff repair with augmentation with the Restore patch.  SURGEON:  Trudee Grip, M.D.  ASSISTANT:  Cherly Beach  ANESTHESIA:  General.  ESTIMATED BLOOD LOSS:  Minimal.  DRAINS:  None.  COMPLICATIONS:  None.  INDICATIONS FOR PROCEDURE:  Patient is a 77 year old female with severe left shoulder pain and impingement syndrome, degenerative AC joint and rotator cuff tear.  She presents now for the above mentioned procedure.  DESCRIPTION OF OPERATION:  After successful administration of general anesthesia, the patient was placed in the upright beach chair position and left upper extremity and shoulder girdle were isolated form the trunk with plastic draped and prepped and draped in the usual sterile fashion.  A standard incision is made along the skin lines, coursing from the mid acromial level anteriorly.  Subcutaneous tissue was infiltrated with 6 cc of 1% lidocaine with epinephrine and the incision was made with a 10 blade through subcutaneous tissue to the deltoid fascia.  Subcutaneous flaps were elevated and the periosteum over the clavicle was split longitudinally and that is carried all the way over across the acromion to the lateral deltoid.  Deltoid fibers split in line and the entire anterior flap and subperiosteum elevated. Posteriorly, just over the distal clavicle a flap is elevated.  The distal 1.5 cm of the clavicle was  then removed with an oscillating saw.  Acromioplasty is then also performed with the oscillating saw so as to create a flat under surface.  The bursa is then removed revealing a large degenerative rotator cuff tear about 2 x 2 cm.  The edge was minimally mobile and once it was debrided back to more healthy appearing tendon, it was about 2 x 2.5 cm in size.  It went from the biceps tend back posteriorly.  Given the poor quality of tissue we need augmentation with the Restore patch.  At this point the trough is created adjacent to the greater tuberosity and two anchors are placed.  The anchor sutures are passed through the free edge of the tendon which is sewn down into the trough.  The tendon is very thin and the patch is then configured to the appropriate size to cover the rotator cuff and is sewn down to the cuff tissue with interrupted #2 Ethibon.  The patient is placed under tension so as to provide the maximal benefit.  At this point the wound is copiously irrigated with antibiotic solution and deltoid reattached to the acromion through drill holes with #1 Ethibon.  Fascia with clavicle is embrocated to hold the clavicle down with Ethibon.  Deltoid split is also closed with Ethibon.  Subcutaneous tissues closed with 2-0 Vicryl and subcuticular running 4-0 Monacryl.  Incision is clean and dry and Steri-Strips and bulky sterile dressing applied.  Patient placed in shoulder immobilizer, awakened and  transported to the recovery room in stable condition. DD:  03/14/00 TD:  03/14/00 Job: [id number]

## 2011-01-25 NOTE — Discharge Summary (Signed)
[place]. [facility]  Patient:    Jill Howell                           MRN: [id number] Adm. Date:  [id number] Disc. Date: [id number] Attending:  Rudean Hitt CC:         Mikey Bussing, M.D.                           Discharge Summary  DISCHARGE DIAGNOSES: 1. Nausea. 2. Orthostatic hypotension. 3. Hypokalemia. 4. Paroxysmal atrial fibrillation, resolved. 5. Hyponatremia. 6. Status post coronary artery bypass grafting. 7. Failure to thrive.  OPERATION:  None.  HISTORY OF PRESENT ILLNESS:  This 77 year old woman was admitted as an emergency on October 06, 1999, by Dr. Meade Maw, because of failure to thrive at home, and worsening nausea.  She had had nausea, vomiting, and diarrhea for the previous ive days, with poor p.o. intake, and weight loss.  She had been discharged on September 16, 1999, after coronary artery bypass graft surgery.  At the time of discharge, her serum sodium was 137, and on admission it was 107.  She had been taking Zaroxolyn and Lasix at home.  PHYSICAL EXAMINATION:  VITAL SIGNS:  Blood pressure 128/50, pulse 90, in sinus rhythm.  Oxygen saturation 89%-91% on room air.  Weight 140 pounds.  LUNGS:  Diminished breath sounds and rales at the bases.  HEART:  No S3.  No peripheral edema.  HOSPITAL COURSE:  The patient was given Phenergan as necessary for nausea.  Her  hyponatremia was corrected with gentle IV fluids, with normal saline and potassium. IV Lasix was continued, but the Zaroxolyn was stopped.  Urinary lytes were checked. Since her mental status was okay, it was felt that we could slowly correct the serum sodium.  Dr. Kathlee Nations Trigt III and Dr. Gwenith Daily. Tyrone Sage saw the patient for surgical followup, and felt that her wounds were all healing nicely.  By October 08, 1999, her serum sodium was up to 122, potassium 3.7, BUN still low t 13.  TSH was noted to be elevated at 7.9, and her  Synthroid dose was increased. A 2-D echocardiogram was done which showed satisfactory left ventricular function. Her cardiac rhythm remained stable on telemetry.  Her activity gradually increased, and the dietitians worked closely with her.  It was felt that she would benefit from cardiac rehabilitation in [place] where she lives.  This was arranged for an outpatient basis.  Her Lasix was switched rom IV to oral route on October 10, 1999.  DISPOSITION:  She is discharged home improved on October 11, 1999.  Her oxygen saturation at discharge was 94% on room air, blood pressure 115/60, and her pulse was 90 and regular.  The lungs were clear on auscultation at the time of discharge.  LABORATORY DATA:  Discharge hemoglobin 10.6, hematocrit 31, white count 8600. Serum sodium rose from 109 to 132.  By discharge her potassium was 4.8, BUN 14.  Her CPKs and CPK-MBs were normal.  TSH as noted was elevated at 7.9.  Serum cortisol was drawn and was normal at 11.8.  Prealbumin was low at 12.7, with a normal being 18-45.  Serum digoxin was 0.9.  Urine sodium on admission was 36, urine potassium 36. Urinalysis showed many bacteria, but negative nitrite, and urine culture was no growth.  DISCHARGE MEDICATIONS: 1. Lasix 40 mg 1-1/2  tablets b.i.d. 2. K-Dur 10 mEq two tablets t.i.d. 3. Synthroid 0.125 mg q.d. 4. Atenolol 25 mg 1/2 tablet b.i.d. 5. Lanoxin 0.125 mg q.d. 6. Protonix 40 mg q.d. 7. Niferex 150 mg b.i.d. 8. Nitrostat 1/150 sublingually p.r.n. 9. Coated aspirin 81 mg q.d.  ACTIVITIES:  She is to walk as tolerated.  INSTRUCTIONS:  She is to avoid adding salt.  FOLLOWUP: She will be seen in the office on October 26, 1999, for an office visit, and we will also get a CBC, BMP, and a chest x-ray that day.  CONDITION ON DISCHARGE:  Improved. DD:  10/31/99 TD:  11/01/99 Job: [id number]

## 2011-01-25 NOTE — H&P (Signed)
[place]. [facility]  Patient:    Jill Howell, Jill Howell                         MRN: [id number] Adm. Date:  03/14/00 Attending:  Ollen Gross, M.D. Dictator:   Druscilla Brownie. Shela Nevin, P.A. CC:         Clovis Pu. Patty Sermons, M.D.                         History and Physical  DATE OF BIRTH:  10-06-2018  CHIEF COMPLAINT:  Pain in my left shoulder.  HISTORY OF PRESENT ILLNESS:  This 77 year old white female has been seen by [place] for continuing problems concerning her left shoulder.  She has had limitation of range of motion and significant pain, both with activity and resting.  She has crepitus on range of motion with abduction to 90 degrees which is extremely uncomfortable.  She has lost strength with external rotation and abduction.  MRI has shown head of the rotator cuff with impingement.  It was felt that this patient would benefit from surgical intervention, and is being admitted for left shoulder open acromioplasty, distal clavicle resection, rotator cuff repair with possible restore patch.  The patient had been seen preoperatively by Dr. Patty Sermons, and Dr. Charm Barges is aware of the surgery.  PAST MEDICAL HISTORY: 1. This patient had rotator cuff repair of the right shoulder with good    results. 2. She had bladder tack x 2. 3. Bowel surgeries x 2. 4. Hysterectomy. 5. In December 2000 she had four vessel coronary artery bypass graft. 6. History of asthma. 7. Pneumonia. 8. She takes allergy shots.  INTERNAL MEDICINE PHYSICIAN:  Dr. Patty Sermons.  UROLOGIST:  Dr. Aldean Ast.  MEDICAL PHYSICIAN:  Dr. Charm Barges.  CURRENT MEDICATIONS:  1. ______/Atropine tabs one q.4h.  2. Lasix 40 mg one tab b.i.d.  3. K-Dur 20 mEq two tabs b.i.d.  4. Synthroid 0.125 mg one q.d.  5. Atenolol 25 mg 1/2 b.i.d.  6. Lanoxin 0.125 mg one q.d.  7. Protonix 40 mg one q.d.  8. Niferex 150 mg one b.i.d.  9. Nitrostat 150 mg sublingual p.r.n. 10. Coated aspirin 81 mg q.d., will stop  prior to surgery. 11. Lipitor 20 mg daily. 12. She also takes an allergy shot once a week.  ALLERGIES:  PENICILLIN and SOME ANESTHETIC OR PREANESTHETIC AGENT that was used during her heart surgery.  SOCIAL HISTORY:  The patient is widowed, neither smokes nor drinks.  FAMILY HISTORY:  Noncontributory.  REVIEW OF SYSTEMS:  CNS:  No seizure disorder, problems with numbness or double vision.  RESPIRATORY:  No productive cough, no hemoptysis, no shortness of breath.  She does have some dry cough from time to time she attributes to asthma.  CARDIOVASCULAR:  No chest pain, no angina, no orthopnea.  GASTROINTESTINAL:  No nausea, vomiting, melena, or bloody stool.  GENITOURINARY:  No discharge, dysuria, or hematuria.  The patient does have occasional incontinence.  MUSCULOSKELETAL:  Primarily in present illness, left shoulder.  PHYSICAL EXAMINATION:  GENERAL:  Alert, cooperative, and friendly 66 year old thin female who is accompanied by a friend.  VITAL SIGNS:  Blood pressure 118/48, pulse 76 and regular, respirations 12.  HEENT:  Normocephalic.  PERRLA.  Extraocular movements intact.  Oropharynx is clear.  CHEST:  Clear to auscultation, no rhonchi, no rales.  HEART:  Regular rate and rhythm, no murmurs are heard.  ABDOMEN:  Soft, nontender,  liver and spleen not felt.  GENITALIA/RECTAL/PELVIC/BREASTS:  Not done, not pertinent to present illness.  EXTREMITIES:  Left shoulder as in present illness above.  ADMISSION DIAGNOSES: 1. Left shoulder impingement with AC arthrosis and rotator cuff tear. 2. Hypothyroidism. 3. Asthma. 4. History of coronary artery bypass graft. 5. Coronary artery disease.  PLAN:  The patient will be admitted for left shoulder open acromioplasty with distal clavicle resection, rotator cuff repair, and possible restore of patch. Should we have any medical problems, we will certainly ask Dr. Patty Sermons to follow along with [place]. DD:  03/06/00 TD:   03/06/00 Job: [id number]

## 2011-01-25 NOTE — H&P (Signed)
[place]. [facility]  Patient:    Jill Howell                           MRN: [id number] Adm. Date:  [id number] Attending:  [place], Peter Manning CC:         Jill Howell, M.D.             _____________, M.D., [place], [place]                         History and Physical  CHIEF COMPLAINT:  Chest pain.  HISTORY OF PRESENT ILLNESS:  Jill Howell is a 77 year old white female who has recently been evaluated for chest pain.  She has no known history of coronary artery disease.  She was recently seen by Dr. ______ _________ in [place], who performed an ECG and possibly an echocardiogram.  She was subsequently referred for a treadmill test.  The results of this stress test are unknown.  Because of continued symptoms, she was referred for further evaluation.  Dr. Patty Howell performed an adenosine Cardiolite study.  This demonstrated evidence of significant inferolateral and apical ischemia, which was at least partially reversible. Ejection fraction was calculated at 29%.  Last night the patient developed substernal chest pain at 8 p.m., described as fairly severe midsternal chest pain radiating to her left arm.  The chest pain lasted approximately an hour, then resolved, but she had persistent left arm pain later in the night.  She is currently pain free.  Because of her refractory symptoms and markedly abnormal Cardiolite study, she is now admitted with diagnosis of unstable angina.  PAST MEDICAL HISTORY:  The patient has had previous bladder surgery by Dr. Aldean Ast.  She has a history of postnasal drainage.  She was diagnosed with pneumonia in October 2000.  She has significant hyperlipidemia with recent panel showing triglycerides of 304, cholesterol 338, HDL 32, LDL 245.  She denies any  history of diabetes or hypertension.  PREVIOUS MEDICATIONS:  1. Coumadin 1 mg daily.  2. Covera-HS 180 mg daily.  3. Zestril 5 mg daily.  4. Synthroid 0.1 mg  daily.  REVIEW OF SYSTEMS:  Otherwise unremarkable.  FAMILY HISTORY:  Father had heart disease.  SOCIAL HISTORY:  Denies tobacco or alcohol use.  She is married.  PHYSICAL EXAMINATION:  GENERAL:  The patient is a pleasant white female, obese, in no distress.  VITAL SIGNS:  Blood pressure 130/80, pulse 75 and regular.  HEENT:  Unremarkable.  Pupils are equal, round, and reactive.  Extraocular movements are full.  Fundi are normal.  Oropharynx was clear.  Tongue is midline.  NECK:  Supple without JVD, adenopathy, thyromegaly, or bruits.  LUNGS:  Clear.  CARDIAC:  Regular rate and rhythm.  Normal S1 and S2 without gallops, murmurs, rubs, or clicks.  PMI is normal.  ABDOMEN:  Soft and nontender without hepatosplenomegaly, masses, or bruits.  EXTREMITIES:  Femoral and pedal pulses 2+ and symmetric.  She has no phlebitis r edema.  NEUROLOGIC:  Nonfocal.  LABORATORY DATA:  ECG shows normal sinus rhythm.  There are Q waves in leads II, III, and aVF suggesting prior inferior wall myocardial infarction.  There is also poor R wave progression in V1-V3 suggesting possible old anteroseptal myocardial infarction.  Chest x-ray showed cardiomegaly with no CHF.  IMPRESSION:  1. Unstable angina pectoris.  2. Arteriosclerotic coronary artery disease with markedly abnormal adenosine  Cardiolite study.  3. Severe combined hyperlipidemia.  4. Family history of coronary disease.  PLAN:  The patient will be admitted for medical stabilization with IV heparin and nitroglycerin.  We will continue on aspirin.  Will stop her Covera and start her on beta blocker.  Will plan on proceeding cardiac catheterization in the morning if her protime is not too high.  Will increase her ACE inhibitor further. Continue on Lipitor. DD:  09/06/99 TD:  09/06/99 Job: [id number]

## 2011-01-25 NOTE — Assessment & Plan Note (Signed)
[facility]                             PULMONARY OFFICE NOTE   NAME:MAYTaniya, Dasher                          MRN:          [id number]  DATE:08/14/2006                            DOB:          Nov 12, 1947    PROBLEMS:  1. Asthmatic bronchitis.  2. Allergic rhinitis.  3. Coronary disease/bypass graft/congestive heart failure.   HISTORY:  She has been fairly stable with some increased nasal  congestion during the recent weather changes. She mentioned some  occipital and vertex headache that sounds more likely to arthritic  changes in the spine with occipital neuralgia, rather than being sinus  pressure related. She continues allergy vaccine given by a neighbor who  is a Engineer, civil (consulting), and she wants to continue, although at age 77 I invited her  to stop. We reviewed issues of administration outside of a medical  office, anaphylaxis and epinephrine.   MEDICATIONS:  1. Allergy vaccine.  2. Synthroid 125 mcg.  3. Atenolol 25 mg.  4. Lipitor 20 mg.  5. Digitek 125 mcg.  6. Furosemide 40 mg.  7. Aspirin 81 mg.  8. Protonix 40 mg.  9. P.R.N. use of albuterol.  10.Allegra 60 mg.  11.Nitroglycerin.  12.EpiPen.  Drug intolerance of PENICILLIN.   OBJECTIVE:  Weight 129 pounds, blood pressure 122/64, pulse regular 74,  room air saturation 95%. She is alert, oriented and appropriate.  Conjunctivae are clear. Nasal mucosa is wet, but not obstructed. Pharynx  is clear. Lungs are clear to PNA. Heart sounds regular without murmur.   IMPRESSION:  Mild allergic rhinitis and asthma. I am not sure how much  allergy vaccine is helping anymore, but she believes in it and she chose  to continue. Headache is probably not related to allergy problems.   PLAN:  1. Sample trial Astelin once each nostril at bedtime P.R.N.  2. EpiPen refill.  3. Sample Xyzal as an alternative to Astelin.  4. Try changing Allegra to the Xyzal samples using 5 mg daily p.r.n.  5. Schedule one  year, earlier p.r.n.     [name] D. Maple Hudson, MD, Tonny Bollman, FACP  Electronically Signed    CDY/MedQ  DD: 08/16/2006  DT: 08/17/2006  Job #: [id number]   cc:   Marne A. Milinda Antis, MD  [place]ell Clement, M.D.

## 2011-01-25 NOTE — Cardiovascular Report (Signed)
[place]. [facility]  Patient:    Jill Howell                           MRN: [id number] Proc. Date: 09/07/99 Adm. Date:  [id number] Attending:  Mikey Bussing CC:         Clovis Pu Patty Sermons, M.D.             Mikey Bussing, M.D.             Raelyn Number, M.D.; [place], [place]                        Cardiac Catheterization  INDICATIONS FOR PROCEDURE:  The patient is a 77 year old white female with history of severe combined hyperlipidemia.  She has had recent increasing chest pain and was admitted with unstable angina pectoris.  A recent adenosine Cardiolite was markedly abnormal, with multiple perfusion defects and an ejection fraction of 9%.  ACCESS:  Via the right femoral artery using the Seldinger technique.  EQUIPMENT: 1. 6-French 4 cm right Judkins catheter. 2. 6-French ______ right catheter. 3. 6-French pigtail catheter. 4. 6-French arterial sheath.  MEDICATIONS: 1. Local anesthesia:  1% Xylocaine. 2. Contrast:  110 cc Omnipaque.  HEMODYNAMIC DATA: 1. Aortic pressure:  138/67 with mean of 98 mmHg. 2. Left ventricular pressure:  124 with EDP 23 mmHg.  ANGIOGRAPHIC DATA:  The left coronary artery rises and distributes normally. All of her coronary arteries are severely, diffusely calcified.  The left main coronary artery has severe 90% ostial stenosis, followed by a 95%  stenosis distally.  The left anterior descending artery has diffuse, small irregularities, with 40%  narrowing in the proximal and mid vessel.  The first diagonal vessel is a large  branch with a 60% proximal stenosis.  The left circumflex coronary artery gives rise to two marginal vessels.  The left circumflex is severely diseased up to 90% in the mid vessel.  The right coronary artery arises normally.  It is occluded proximally following a conus branch.  The distal right coronary artery fills by left to right and right to right  collaterals.  LEFT VENTRICULOGRAM:  Left ventricular angiography was performed in the RAO view. This demonstrated mild left ventricular enlargement.  There was severe global hypokinesia, with apical akinesia.  Ejection fraction was estimated at 25%. There is severe mitral annular calcification, with 3+ mitral insufficiency.  The aortic valve appears normal.  PERCUTANEOUS INTERVENTION:  The patient tolerated the initial procedure well, had no chest pain and was hemodynamically stable.  After she was moved to the holding area and had the right groin sheath removed, she developed severe substernal chest pain.  Blood pressure remained stable.  The patient developed atrial fibrillation with a rate of 120.  She was given IV Lopressor (5 mg x 3), and her nitroglycerin drip was increased.  This resulted in some improvement in her chest pain, but she had marked ischemic changes on ECG.  She was taken back to the cardiac catheterization lab and subsequently had intra-aortic balloon pump placed via her left femoral artery by Seldinger technique.  This was performed without difficulty and yielded good augmentation of her pressure.  Her chest pain resolved.  She remained in atrial fibrillation but her heart rate was controlled at a rate of 00.  A Swan-Ganz catheter was also inserted percutaneously via the left femoral vein.  HEMODYNAMIC DATA:  The following hemodynamic measurements were made: 1. Right atrial pressure:  10/5 with a mean of 4 mmHg. 2. Right ventricular pressure:  23 with EDP of 2 mmHg. 3. Pulmonary artery pressure:  19/9 with a mean of 14 mmHg. 4. Pulmonary capillary wedge pressure:  8/7 with a mean of 7 mmHg.  FINAL INTERPRETATION: 1. Critical left main and three-vessel obstructive atherosclerotic coronary artery    disease. 2. Severe left ventricular dysfunction. 3. Moderate mitral insufficiency. 4. Development of atrial fibrillation and severe ischemia post-cardiac     catheterization, relieved with medical therapy and placement of an    intra-aortic balloon pump. 5. Normal right heart pressures.  PLAN:  [facility] was consulted for emergent coronary bypass surgery. DD:  09/07/99 TD:  09/09/99 Job: [id number]

## 2011-01-25 NOTE — Op Note (Signed)
[place]  Patient:    Jill Howell                           MRN: [id number] Proc. Date: 09/07/99 Adm. Date:  [id number] Attending:  Mikey Bussing CC:         Peter M. Swaziland, M.D. [facility]                           Operative Report  OPERATION:  Emergency coronary artery bypass grafting x 4 (left internal mammary artery to the left anterior descending coronary artery, saphenous vein graft to the diagonal, saphenous vein graft to the obtuse marginal, saphenous vein graft to he posterior descending).  PREOPERATIVE DIAGNOSIS:  Critical left main stenosis with severe three-vessel coronary artery disease, class 4 unstable angina, preoperative cardiogenic shock, with the placement of an intra-aortic balloon pump in the cardiac catheterization laboratory.  SURGEON:  Mikey Bussing, M.D.  ASSISTANT:  Sherrie George, P.A.  ANESTHESIA:  General by Dr. Bedelia Person, M.D.  INDICATIONS:  The patient is a 77 year old white female with progressive angina  over the past several weeks, which culminated in admission for rule out myocardial infarction.  She had a markedly positive stress test, and underwent a cardiac catheterization, which was complicated by electrocardiogram changes, cardiogenic shock, and ischemic mitral regurgitation, requiring placement of an intra-aortic balloon pump.  Her coronary anatomy was found to be consistent with significant  critical left main stenosis, with total occlusion of the right coronary and global hypokinesia.  Her hemodynamics and clinical situation stabilized with intra-aortic balloon pump, and she was taken to the operating room for an emergency coronary  revascularization.  Prior to the operation, I examined the patient in the cardiac catheterization laboratory and discussed the results of the cardiac catheterization with her, and with her husband.  I discussed the indications  and expected benefits of emergency coronary bypass grafting.  I discussed the details of the operation, including he placement of the surgical incisions, the choice of conduit, the use of general anesthesia, and the cardiopulmonary bypass, and the expected recovery period.  reviewed the risks of the operation as well, including those risks of myocardial infarction, CVA, bleeding, infection, and death.  She  understood the implications and risks of surgery, and agreed to proceed with the operation as planned, on the informed consent.  OPERATIVE FINDINGS:  The patients heart is enlarged, and she has an old anterior scar.  She had significant myocardial edema, consistent with subacute ischemia.  The coronaries were small and difficult targets for grafting.  The mammary artery and saphenous vein graft were good conduits.  She separated from cardiopulmonary bypass, and the transesophageal echocardiogram showed minimal 1+ mitral regurgitation and improved global left ventricular function, following the placement of the bypass grafts.  The balloon pump was used in the operating room that had been placed by Dr. Peter M. Swaziland in the cardiac catheterization laboratory.  DESCRIPTION OF PROCEDURE:  The patient was brought to the operating room and placed supine on the operating room table, where general anesthesia was induced under invasive hemodynamic monitoring.  The chest, abdomen, and legs were prepped with Betadine and draped as a sterile field.  A medium sternotomy was performed as the saphenous vein was harvested from the right leg.  The internal mammary artery was harvested as a pedicle graft from its origin  at the subclavian vessels. Heparin was administered, and the ACT was documented as being therapeutic.  The patient was then cannulated to purse strings in the ascending aorta and right atrium, and placed on cardiopulmonary bypass.  The coronaries were identified and  the mammary artery and vein grafts were prepared for the anastomoses.  A cardioplegic cannula was placed, and the patient was cooled to 28 degrees.  The aortic crossclamp was applied, and 500 cc of cold blood cardioplegia was delivered to the aortic root  with immediate cardioplegic arrest, and the septal temperature dropping to less  than 12 degrees.  Topical iced saline slush was used to augment myocardial preservation, and a pericardial insulator pad was used to protect the left phrenic nerve.  The distal coronary anastomoses were then performed.  The first distal anastomosis was to the diagonal.  This was a 1.5 mm vessel.  A proximal 95% stenosis at the  left main, and a reverse saphenous vein was sewn end-to-side with running #7-0 Prolene.  The second distal anastomosis was to the posterior descending which was chronically occluded, and a small 1.2 mm vessel.  A reverse saphenous vein was ewn end-to-side with running #7-0 Prolene.  There was good flow through the graft. The third distal anastomosis was to the obtuse marginal, which was a 1.5 mm vessel, a proximal 95% stenosis.  A reverse saphenous vein was sewn end-to-side with running #7-0 Prolene, and there was good flow through the graft.  Cardioplegia was redosed. The fourth distal anastomosis was to the distal third of the LAD, which was a 1.5 mm vessel, and with a 95% proximal stenosis.  The left internal mammary pedicle was brought through an opening created in the left lateral pericardium.  It was brought down on the LAD and sewn end-to-side with running #8-0 Prolene.  There was excellent flow through the anastomosis with the immediate rise in septal temperature after the release of the pedicle clamp on the mammary artery.  The mammary pedicle was secured to the epicardium, and the aortic crossclamp was removed.  The heart was cardioverted back to a regular rhythm.  A partial occluding clamp was placed on  the ascending aorta, and three proximal vein anastomoses were performed using a 4.0 mm punch and running #6-0 Prolene.  The partial occluding clamp was  removed, and the vein grafts were perfused.  All grafts had excellent flow, and   hemostasis was documented at the proximal and distal sites.  The patient was rewarmed to 37 degrees.  Temporary pacing wires were applied.  The intra-aortic  balloon pump was restarted.  The lungs were re-expanded.  The ventilator was turned on.  The patient was weaned from cardiopulmonary bypass on low-dose dopamine without difficulty.  Hemodynamics and blood pressure were stable.  A transesophageal echocardiogram post-pump indicated improved global left ventricular function with minimal insignificant mitral regurgitation.  Protamine was administered and the cannulas were removed.  There was diffuse coagulopathy and  oozing, and this was treated with a platelet and FFP transfusion.  This improved the coagulation function.  The mediastinum was irrigated with warm antibiotic irrigation, and the superior pericardium was closed over the aorta and vein grafts. The leg incision was irrigated and closed in a standard fashion.  The sternum was reapproximated after the placement of two mediastinal tubes and a left pleural chest tube.  The pectoralis fascia and subcutaneous layers were closed with running Vicryl, and the skin was closed with skin staples.  The total cardiopulmonary bypass  time was 120 minutes, with the aortic crossclamp time of 60 minutes.  The patient returned to the intensive care unit on the balloon pump in stable but critical condition. DD:  09/07/99 TD:  09/09/99 Job: [id number]

## 2011-03-08 ENCOUNTER — Other Ambulatory Visit: Payer: Self-pay | Admitting: Cardiology

## 2011-03-08 DIAGNOSIS — E039 Hypothyroidism, unspecified: Secondary | ICD-10-CM

## 2011-03-11 NOTE — Telephone Encounter (Signed)
escribe request  

## 2011-03-19 ENCOUNTER — Ambulatory Visit (INDEPENDENT_AMBULATORY_CARE_PROVIDER_SITE_OTHER): Payer: Medicare Other

## 2011-03-19 DIAGNOSIS — J309 Allergic rhinitis, unspecified: Secondary | ICD-10-CM

## 2011-03-20 ENCOUNTER — Encounter: Payer: Self-pay | Admitting: Family Medicine

## 2011-03-20 ENCOUNTER — Telehealth: Payer: Self-pay | Admitting: *Deleted

## 2011-03-20 NOTE — Telephone Encounter (Signed)
CAN DR. Patty Sermons SEE HER FOR BLADDER INFECTION. IF NOT CAN YOU CALL AND REFER HER TO ONE DR.

## 2011-03-20 NOTE — Telephone Encounter (Signed)
Advised to contact primary care doctor or Dr Aldean Ast, states he is her urologist.

## 2011-03-21 ENCOUNTER — Encounter: Payer: Self-pay | Admitting: Family Medicine

## 2011-03-21 ENCOUNTER — Ambulatory Visit (INDEPENDENT_AMBULATORY_CARE_PROVIDER_SITE_OTHER): Payer: Medicare Other | Admitting: Family Medicine

## 2011-03-21 VITALS — BP 126/60 | HR 72 | Temp 97.7°F | Wt 130.0 lb

## 2011-03-21 DIAGNOSIS — R300 Dysuria: Secondary | ICD-10-CM

## 2011-03-21 DIAGNOSIS — N390 Urinary tract infection, site not specified: Secondary | ICD-10-CM | POA: Insufficient documentation

## 2011-03-21 LAB — POCT URINALYSIS DIPSTICK
Bilirubin, UA: NEGATIVE
Glucose, UA: NEGATIVE
Ketones, UA: NEGATIVE
pH, UA: 7

## 2011-03-21 MED ORDER — SULFAMETHOXAZOLE-TRIMETHOPRIM 800-160 MG PO TABS: 1.0000 | ORAL_TABLET | Freq: Two times a day (BID) | ORAL | Status: AC

## 2011-03-21 NOTE — Progress Notes (Signed)
Dysuria: yes duration of symptoms: "haven't felt good for weeks" abdominal pain: no fevers:no back pain: no Vomiting: no  Meds, vitals, and allergies reviewed.   ROS: See HPI.  Otherwise negative.    GEN: nad, alert and oriented, appears younger than stated age HEENT: mucous membranes moist NECK: supple CV: rrr PULM: ctab, no inc wob ABD: soft, +bs, suprapubic area not tender EXT: no edema SKIN: no acute rash BACK: no CVA pain

## 2011-03-21 NOTE — Assessment & Plan Note (Signed)
Check ucx, start septra and f/u prn.  She agrees.  D/w pt.  Nontoxic.

## 2011-03-21 NOTE — Patient Instructions (Signed)
Drink plenty of water and start the antibiotics today.  Let us know if you aren't getting better. We'll contact you with your lab report. Take care.

## 2011-04-26 ENCOUNTER — Other Ambulatory Visit: Payer: Self-pay | Admitting: Cardiology

## 2011-04-26 DIAGNOSIS — I4891 Unspecified atrial fibrillation: Secondary | ICD-10-CM

## 2011-04-26 DIAGNOSIS — I509 Heart failure, unspecified: Secondary | ICD-10-CM

## 2011-04-26 DIAGNOSIS — E039 Hypothyroidism, unspecified: Secondary | ICD-10-CM

## 2011-04-26 DIAGNOSIS — E785 Hyperlipidemia, unspecified: Secondary | ICD-10-CM

## 2011-04-26 DIAGNOSIS — I2510 Atherosclerotic heart disease of native coronary artery without angina pectoris: Secondary | ICD-10-CM

## 2011-04-29 ENCOUNTER — Other Ambulatory Visit: Payer: PRIVATE HEALTH INSURANCE | Admitting: *Deleted

## 2011-04-29 ENCOUNTER — Other Ambulatory Visit: Payer: Self-pay | Admitting: Cardiology

## 2011-04-29 ENCOUNTER — Other Ambulatory Visit (INDEPENDENT_AMBULATORY_CARE_PROVIDER_SITE_OTHER): Payer: Medicare Other | Admitting: *Deleted

## 2011-04-29 ENCOUNTER — Ambulatory Visit (INDEPENDENT_AMBULATORY_CARE_PROVIDER_SITE_OTHER): Payer: Medicare Other | Admitting: Cardiology

## 2011-04-29 VITALS — BP 140/68 | HR 76 | Ht 64.0 in | Wt 129.0 lb

## 2011-04-29 DIAGNOSIS — I509 Heart failure, unspecified: Secondary | ICD-10-CM

## 2011-04-29 DIAGNOSIS — E039 Hypothyroidism, unspecified: Secondary | ICD-10-CM

## 2011-04-29 DIAGNOSIS — E785 Hyperlipidemia, unspecified: Secondary | ICD-10-CM

## 2011-04-29 DIAGNOSIS — I119 Hypertensive heart disease without heart failure: Secondary | ICD-10-CM

## 2011-04-29 DIAGNOSIS — E7800 Pure hypercholesterolemia, unspecified: Secondary | ICD-10-CM

## 2011-04-29 DIAGNOSIS — Z951 Presence of aortocoronary bypass graft: Secondary | ICD-10-CM

## 2011-04-29 DIAGNOSIS — I2510 Atherosclerotic heart disease of native coronary artery without angina pectoris: Secondary | ICD-10-CM

## 2011-04-29 DIAGNOSIS — I4891 Unspecified atrial fibrillation: Secondary | ICD-10-CM

## 2011-04-29 DIAGNOSIS — R1013 Epigastric pain: Secondary | ICD-10-CM

## 2011-04-29 DIAGNOSIS — K219 Gastro-esophageal reflux disease without esophagitis: Secondary | ICD-10-CM

## 2011-04-29 DIAGNOSIS — Z9889 Other specified postprocedural states: Secondary | ICD-10-CM

## 2011-04-29 LAB — BASIC METABOLIC PANEL
Calcium: 8.8 mg/dL (ref 8.4–10.5)
Creatinine, Ser: 1 mg/dL (ref 0.4–1.2)
GFR: 55.31 mL/min — ABNORMAL LOW (ref >60.00)
Glucose, Bld: 105 mg/dL — ABNORMAL HIGH (ref 70–99)
Sodium: 140 mEq/L (ref 135–145)

## 2011-04-29 LAB — LIPID PANEL
Cholesterol: 182 mg/dL (ref 0–200)
HDL: 38.8 mg/dL — ABNORMAL LOW (ref >39.00)
Total CHOL/HDL Ratio: 5
Triglycerides: 291 mg/dL — ABNORMAL HIGH (ref 0.0–149.0)

## 2011-04-29 LAB — HEPATIC FUNCTION PANEL
Albumin: 4.2 g/dL (ref 3.5–5.2)
Alkaline Phosphatase: 72 U/L (ref 39–117)
Bilirubin, Direct: 0 mg/dL (ref 0.0–0.3)

## 2011-04-29 LAB — LDL CHOLESTEROL, DIRECT: Direct LDL: 92.2 mg/dL

## 2011-04-29 MED ORDER — PANTOPRAZOLE SODIUM 40 MG PO TBEC: 40.0000 mg | DELAYED_RELEASE_TABLET | Freq: Every day | ORAL | Status: DC

## 2011-04-29 MED ORDER — FUROSEMIDE 40 MG PO TABS: 40.0000 mg | ORAL_TABLET | Freq: Every day | ORAL | Status: DC

## 2011-04-29 NOTE — Assessment & Plan Note (Signed)
The patient has a history of GERD.  If she does not take her Protonix she will have a flareup of the symptoms of reflux.  As long as she stays on Protonix she does well.

## 2011-04-29 NOTE — Assessment & Plan Note (Signed)
The patient has a past history of hypothyroidism.  She is on thyroid replacement and is clinically euthyroid

## 2011-04-29 NOTE — Assessment & Plan Note (Signed)
Since last visit she's not had to take any sublingual nitroglycerin.  She's not been expressing any exertional chest pain.  She has not been aware of any palpitations or recurrent paroxysmal atrial fibrillation

## 2011-04-29 NOTE — Assessment & Plan Note (Signed)
No symptoms of congestive heart failure or fluid retention.  Frequently she takes just half of a furosemide instead of a full tablet and this is okay.  She has not been having any edema.

## 2011-04-29 NOTE — Progress Notes (Signed)
Jill Howell Date of Birth:  1918/11/04 [name] / [facility] [address]   [address] [place], [place]  [address] [phone]           Fax   [phone]  History of Present Illness: This pleasant 77 year old woman is seen for a scheduled followup office visit.  She has a history of known ischemic heart disease and history of hypothyroidism.  She's had a past history of congestive heart failure.  She had coronary artery bypass graft surgery in 2000.  She's also had a history of high blood pressure was high cholesterol but is not diabetic.  She has a history of gastroesophageal reflux disease and is on Protonix.  She is 78 years old and continues to be very independent and drives here to the office from her home in [place] by herself  Current Outpatient Prescriptions  Medication Sig Dispense Refill  . AMBULATORY NON FORMULARY MEDICATION every 21 ( twenty-one) days. Medication Name: Allergy injection 1:10 Nurse neighbor (W-E)       . Ascorbic Acid (VITAMIN C) 1000 MG tablet Take 1,000 mg by mouth daily.        [name] aspirin 81 MG EC tablet Take 81 mg by mouth daily.        . furosemide (LASIX) 40 MG tablet Take 1 tablet (40 mg total) by mouth daily.  90 tablet  3  . Insulin Syringe-Needle U-100 (INSULIN SYRINGE 1CC/28G) 28G X 1/2" 1 ML MISC by Does not apply route as directed.        [name] LANOXIN 0.125 MG tablet TAKE 1 TABLET DAILY  90 tablet  3  . pantoprazole (PROTONIX) 40 MG tablet Take 1 tablet (40 mg total) by mouth daily.  90 tablet  3  . pyridoxine (B-6) 200 MG tablet Take 200 mg by mouth daily.        [name] SYNTHROID 125 MCG tablet TAKE 1 TABLET EVERY DAY  90 tablet  3  . atenolol (TENORMIN) 25 MG tablet TAKE 1/2 TABLET TWICE A DAY  30 tablet  2  . azelastine (ASTELIN) 137 MCG/SPRAY nasal spray Place 2 sprays into the nose 2 (two) times daily. Use in each nostril as directed       . EPINEPHrine (EPIPEN JR) 0.15 MG/0.3ML injection Inject 0.15 mg into the muscle as needed.         . fexofenadine (ALLEGRA) 60 MG tablet Take 60 mg by mouth 2 (two) times daily as needed.        . simvastatin (ZOCOR) 40 MG tablet Take 1 tablet (40 mg total) by mouth every evening.  90 tablet  3    Allergies  Allergen Reactions  . Penicillins     REACTION: ?    Patient Active Problem List  Diagnoses  . HYPOTHYROIDISM  . DIABETES MELLITUS, TYPE II  . HYPERLIPIDEMIA  . HEARING IMPAIRMENT  . CORONARY ARTERY DISEASE  . ATRIAL FIBRILLATION  . CONGESTIVE HEART FAILURE  . ALLERGIC RHINITIS  . ASTHMA  . GERD  . UTI (lower urinary tract infection)    History  Smoking status  . Never Smoker   Smokeless tobacco  . Not on file    History  Alcohol Use No    Family History  Problem Relation Age of Onset  . Heart attack Father     Review of Systems: Constitutional: no fever chills diaphoresis or fatigue or change in weight.  Head and neck: no hearing loss, no epistaxis, no photophobia or  visual disturbance. Respiratory: No cough, shortness of breath or wheezing. Cardiovascular: No chest pain peripheral edema, palpitations. Gastrointestinal: No abdominal distention, no abdominal pain, no change in bowel habits hematochezia or melena. Genitourinary: No dysuria, no frequency, no urgency, no nocturia. Musculoskeletal:No arthralgias, no back pain, no gait disturbance or myalgias. Neurological: No dizziness, no headaches, no numbness, no seizures, no syncope, no weakness, no tremors. Hematologic: No lymphadenopathy, no easy bruising. Psychiatric: No confusion, no hallucinations, no sleep disturbance.    Physical Exam: Filed Vitals:   04/29/11 0854  BP: 140/68  Pulse: 76  The general appearance reveals an elderly alert woman in no acute distress.Pupils equal and reactive.   Extraocular Movements are full.  There is no scleral icterus.  The mouth and pharynx are normal.  The neck is supple.  The carotids reveal no bruits.  The jugular venous pressure is normal.  The thyroid  is not enlarged.  There is no lymphadenopathy.  The chest is clear to percussion and auscultation. There are no rales or rhonchi. Expansion of the chest is symmetrical.  The precordium is quiet.  The first heart sound is normal.  The second heart sound is physiologically split.  There is no murmur gallop rub or click.  There is no abnormal lift or heave.  The abdomen is soft and nontender. Bowel sounds are normal. The liver and spleen are not enlarged. There Are no abdominal masses. There are no bruits.    Normal extremity without phlebitis or edema.  Pedal pulses are present.The skin is warm and dry.  There is no rash.  Strength is normal and symmetrical in all extremities.  There is no lateralizing weakness.  There are no sensory deficits.     Assessment / Plan: Continue same medication.  Recheck in 4 months for followup office visit and lab work.

## 2011-04-30 ENCOUNTER — Telehealth: Payer: Self-pay | Admitting: *Deleted

## 2011-04-30 NOTE — Telephone Encounter (Signed)
Message copied by Lorayne Bender on Tue Apr 30, 2011 10:27 AM ------      Message from: Cassell Clement      Created: Tue Apr 30, 2011 10:16 AM       Please report.The thyroid function studies are normal.The liver tests are stable.The kidney function is normal and the potassium is normal.  The blood sugar is better at 105.The cholesterol is 182 which is normal but the triglycerides are elevated at 291 and she needs to be more careful with sweets and carbohydrates.  Continue same medication.

## 2011-04-30 NOTE — Telephone Encounter (Signed)
Notified of lab results. Will send reports to Dr. Milinda Antis

## 2011-05-09 ENCOUNTER — Telehealth: Payer: Self-pay | Admitting: Internal Medicine

## 2011-05-09 ENCOUNTER — Ambulatory Visit (INDEPENDENT_AMBULATORY_CARE_PROVIDER_SITE_OTHER): Payer: Medicare Other | Admitting: Adult Health

## 2011-05-09 ENCOUNTER — Encounter: Payer: Self-pay | Admitting: Adult Health

## 2011-05-09 VITALS — BP 138/60 | HR 66 | Temp 96.8°F | Wt 129.6 lb

## 2011-05-09 DIAGNOSIS — L309 Dermatitis, unspecified: Secondary | ICD-10-CM | POA: Insufficient documentation

## 2011-05-09 DIAGNOSIS — L259 Unspecified contact dermatitis, unspecified cause: Secondary | ICD-10-CM

## 2011-05-09 MED ORDER — PREDNISONE 10 MG PO TABS: ORAL_TABLET | ORAL | Status: AC

## 2011-05-09 NOTE — Telephone Encounter (Signed)
atc line busy x 3 wcb 

## 2011-05-09 NOTE — Telephone Encounter (Signed)
I spoke with Jill Howell and she states she has 25 bumps all over her back and is itching like crazy. Jill Howell went to the walk-in clinic this morning and was advised she had an allergic reaction to something. Jill Howell was advised to take Claritin. Jill Howell states its not helping and wants to see Dr. Maple Hudson today but have no available openings. Please advise Dr, Maple Hudson. Thanks  Allergies  Allergen Reactions  . Penicillins     REACTION: ?    Carver Fila, CMA

## 2011-05-09 NOTE — Telephone Encounter (Signed)
Spoke with pt and she would like to come in and see TP. Pt states it will take her about 45 minutes to get here. Pt is scheduled with TP at 2:30.

## 2011-05-09 NOTE — Progress Notes (Signed)
Subjective:    Patient ID: Jill Howell, female    DOB: [birth year], 77 y.o.   MRN: [id number]  HPI 77 yo female with known hx of AB, AR  06/23/08- Thinks if she takes Centrum Silver vitamin, or eats her own homemade applesauce she gets a burning sensation, bilateral axillary/ ribs. No GI/GU or chest pain/palpitation otherwise. Denies sneeze, rhinorhea, wheeze or cough.  Continues allergy vaccine 1:10 every 2 weeks.   11/23/08-Asthmatic bronchitis, rhinits, CAD/CHF/AF  4-5 days hard left frontal and maxillary headache, watery rhinorhea. Ears ok, no fever or purulent discharge. Chest feels normal for her.  She is convinced allergy vaccine helps her.   June 22, 2009- Asthmatic bronchitis, rhinitis, CAD. CHF  Pain behind right ear since [place]. Has had ENT and carotid artery eval. Hearing aids.  Discussed flu shot.  Hauled dirt yesterday- still active. Morning cough with phlegm.   June 22, 2010- Asthmatic bronchitis, rhinitis, CAD/ MI. CHF  Nurse:cc Yearly follow up visit-asthma; Breathing is good per patient.  Says she has been doing well recently. She had an MI in February/ Dr Patty Sermons.  Ribs get sore when shes tired. Then she tells me she picked up 20 bushels or walnuts- showing me her stained hands. Takes an antihistamine but doesn't know what it is- says prescription.  Had mold in home- remediated. No asthma or inhaler. Sometimes morning cough.  Nurse neighbor still gives allergy vaccine every 2 weeks. Discussed Epipen. Feels a little bump in right external ear canal- worried because friend had a cancer there.  05/09/2011 Acute walk in  Pt complains of rash on back for 1 week.  Was seen at Urgent care this am for rash - Pimple like rash across back and sides and on feet - Itching - Pt reports pulling a tick off under right arm this am. Unclear is this was a tick or bug because it crawled away. Urgent care dx w/ allergic dermatitis and rx Clarirtin.[name] Lesions are very pruritics. Has been  scratching and try to squeeze them. Believes she did remove 1 tick and but friend saw very small insect vs ticks crawling. She has greater than 25 bumps on back.  Works outside a lot.   Review of Systems Constitutional:   No  weight loss, night sweats,  Fevers, chills, fatigue, or  lassitude.  HEENT:   No headaches,  Difficulty swallowing,  Tooth/dental problems, or  Sore throat,                No sneezing, itching, ear ache, nasal congestion, post nasal drip,   CV:  No chest pain,  Orthopnea, PND, swelling in lower extremities, anasarca, dizziness, palpitations, syncope.   GI  No heartburn, indigestion, abdominal pain, nausea, vomiting, diarrhea, change in bowel habits, loss of appetite, bloody stools.   Resp: No shortness of breath with exertion or at rest.  No excess mucus, no productive cough,  No non-productive cough,  No coughing up of blood.  No change in color of mucus.  No wheezing.  No chest wall deformity  Skin:+ rash or lesions.  GU: no dysuria, change in color of urine, no urgency or frequency.  No flank pain, no hematuria   MS:  No joint pain or swelling.  No decreased range of motion.  No back pain.  Psych:  No change in mood or affect. No depression or anxiety.  No memory loss.         Objective:   Physical Exam GEN: A/Ox3; pleasant ,  NAD, elderly  HEENT:  Anna/AT,  EACs-clear, TMs-wnl, NOSE-clear, THROAT-clear, no lesions, no postnasal drip or exudate noted.   NECK:  Supple w/ fair ROM; no JVD; normal carotid impulses w/o bruits; no thyromegaly or nodules palpated; no lymphadenopathy.  RESP  Clear  P & A; w/o, wheezes/ rales/ or rhonchi.no accessory muscle use, no dullness to percussion  CARD:  RRR, no m/r/g  , no peripheral edema, pulses intact, no cyanosis or clubbing.  GI:   Soft & nt; nml bowel sounds; no organomegaly or masses detected.  Musco: Warm bil, no deformities or joint swelling noted.   Neuro: alert, no focal deficits noted.    Skin: Warm,  scattered papules /pustules , scabs along trunk (front/back) and arm/legs /tops of feet.         Assessment & Plan:

## 2011-05-09 NOTE — Telephone Encounter (Signed)
PATIENT RETURNED CALL PLEASE CALL BACK

## 2011-05-09 NOTE — Telephone Encounter (Signed)
Suggest:      prednisone, 20 mg, # 3     1 daily            And  Allegra/ fexofenadine 180 mg- 1 daily as needed for itching.   Ok to see here  If space available.

## 2011-05-09 NOTE — Assessment & Plan Note (Addendum)
Rash ? Etiology  Does not appear to be a drug reaction.  No new meds/skin products.  ?bug bites, doubt secondary to tick bites.  Reviewed with Dr. Maple Hudson    Plan:  Cool compresses.  Wash areas with soap and water. Pat dry.  Use anti-itch lotions, calamine, etc to help with itching.  Allegra 180mg   daily for 5 days then .As needed   Prednisone 10mg  daily for 3 days and stop  Please contact office for sooner follow up if symptoms do not improve or worsen or seek emergency care ' If not improving will need to call back.

## 2011-05-09 NOTE — Patient Instructions (Signed)
Cool compresses.  Wash areas with soap and water. Pat dry.  Use anti-itch lotions, calamine, etc to help with itching.  Allegra 180mg   daily for 5 days then .As needed   Prednisone 10mg  daily for 3 days and stop  Please contact office for sooner follow up if symptoms do not improve or worsen or seek emergency care ' If not improving will need to call back.

## 2011-06-21 ENCOUNTER — Encounter: Payer: Self-pay | Admitting: Internal Medicine

## 2011-06-21 ENCOUNTER — Ambulatory Visit (INDEPENDENT_AMBULATORY_CARE_PROVIDER_SITE_OTHER): Payer: Medicare Other | Admitting: Internal Medicine

## 2011-06-21 VITALS — BP 110/64 | HR 69 | Ht 64.0 in | Wt 133.2 lb

## 2011-06-21 DIAGNOSIS — Z23 Encounter for immunization: Secondary | ICD-10-CM

## 2011-06-21 DIAGNOSIS — J309 Allergic rhinitis, unspecified: Secondary | ICD-10-CM

## 2011-06-21 DIAGNOSIS — J45909 Unspecified asthma, uncomplicated: Secondary | ICD-10-CM

## 2011-06-21 NOTE — Patient Instructions (Addendum)
Ok to continue allergy vaccine as long as it seems to help, but we can choose to stop at any time.  Flu vax

## 2011-06-21 NOTE — Progress Notes (Signed)
Subjective:    Patient ID: Jill Howell, female    DOB: 1919/01/02, 77 y.o.   MRN: [id number]  HPI 77 yo female with known hx of AB, AR  06/23/08- Thinks if she takes Centrum Silver vitamin, or eats her own homemade applesauce she gets a burning sensation, bilateral axillary/ ribs. No GI/GU or chest pain/palpitation otherwise. Denies sneeze, rhinorhea, wheeze or cough.  Continues allergy vaccine 1:10 every 2 weeks.   11/23/08-Asthmatic bronchitis, rhinits, CAD/CHF/AF  4-5 days hard left frontal and maxillary headache, watery rhinorhea. Ears ok, no fever or purulent discharge. Chest feels normal for her.  She is convinced allergy vaccine helps her.   June 22, 2009- Asthmatic bronchitis, rhinitis, CAD. CHF  Pain behind right ear since [place]. Has had ENT and carotid artery eval. Hearing aids.  Discussed flu shot.  Hauled dirt yesterday- still active. Morning cough with phlegm.   June 22, 2010- Asthmatic bronchitis, rhinitis, CAD/ MI. CHF  Nurse:cc Yearly follow up visit-asthma; Breathing is good per patient.  Says she has been doing well recently. She had an MI in February/ Dr Patty Sermons.  Ribs get sore when shes tired. Then she tells me she picked up 20 bushels or walnuts- showing me her stained hands. Takes an antihistamine but doesn't know what it is- says prescription.  Had mold in home- remediated. No asthma or inhaler. Sometimes morning cough.  Nurse neighbor still gives allergy vaccine every 2 weeks. Discussed Epipen. Feels a little bump in right external ear canal- worried because friend had a cancer there.  05/09/2011 Acute walk in  Pt complains of rash on back for 1 week.  Was seen at Urgent care this am for rash - Pimple like rash across back and sides and on feet - Itching - Pt reports pulling a tick off under right arm this am. Unclear is this was a tick or bug because it crawled away. Urgent care dx w/ allergic dermatitis and rx Clarirtin.[name] Lesions are very pruritics. Has been  scratching and try to squeeze them. Believes she did remove 1 tick and but friend saw very small insect vs ticks crawling. She has greater than 25 bumps on back.  Works outside a lot.   06/21/11-77 year old female never smoker followed for Asthmatic bronchitis, rhinitis, complicated by CAD/ MI/ AF CHF, DM, GERD She continues allergy vaccine given by a neighbor/nurse without problems. She gets her shots every 2-3 weeks. He is using a dehumidifier because of mold in her house. So far the fall season is comfortable for her with occasional sneeze. She remains active and talks about picking up 20 bushels of walnuts from her property. Chest x-ray 12/15/2010 showed changes of COPD with no acute process. I talked with her about stopping allergy vaccine after all this time and at her age. She feels comfortable and safe with them and is considering my suggestion.  Review of Systems Constitutional:   No-   weight loss, night sweats, fevers, chills, fatigue, lassitude. HEENT:   No-  headaches, difficulty swallowing, tooth/dental problems, sore throat,       + sneezing, no- itching, ear ache, nasal congestion, post nasal drip,  CV:  No-   chest pain, orthopnea, PND, swelling in lower extremities, anasarca, dizziness, palpitations Resp: No-   shortness of breath with exertion or at rest.              No-   productive cough,  No non-productive cough,  No-  coughing up of blood.  No-   change in color of mucus.  No- wheezing.   Skin: No-   rash or lesions. GI:  No-   heartburn, indigestion, abdominal pain, nausea, vomiting, diarrhea,                 change in bowel habits, loss of appetite GU: No-   dysuria, change in color of urine, no urgency or frequency.  No- flank pain. MS:  No-   joint pain or swelling.  No- decreased range of motion.  No- back pain. Neuro-   Psych:  No- change in mood or affect. No depression or anxiety.  No memory loss.  Objective:   Physical Exam General- Alert,  Oriented, Affect-appropriate, Distress- none acute, slender, very alert. Skin- rash-none, lesions- none, excoriation- none. Previous rash has resolved. Hands are stained by walnuts juice. Lymphadenopathy- none Head- atraumatic            Eyes- Gross vision intact, PERRLA, conjunctivae clear secretions            Ears- Hearing, canals-normal for age            Nose- Clear, no-Septal dev, mucus, polyps, erosion, perforation             Throat- Mallampati II , mucosa clear , drainage- none, tonsils- atrophic Neck- flexible , trachea midline, no stridor , thyroid nl, carotid no bruit Chest - symmetrical excursion , unlabored           Heart/CV- RRR , no murmur , no gallop  , no rub, nl s1 s2                           - JVD- none , edema- none, stasis changes- none, varices- none           Lung- fine rales or crackles in bases, unlabored, wheeze- none, cough- none , dullness-none, rub- none           Chest wall-  Abd- tender-no, distended-no, bowel sounds-present, HSM- no Br/ Gen/ Rectal- Not done, not indicated Extrem- cyanosis- none, clubbing, none, atrophy- none, strength- nl Neuro- grossly intact to observation

## 2011-06-23 NOTE — Assessment & Plan Note (Signed)
Comfortable and adequate control. Flu shot

## 2011-06-23 NOTE — Assessment & Plan Note (Signed)
At this point, benefit and risk of continued allergy vaccine are both marginal. I think she was worried about the mold in her house. I discussed the mold abatement and environmental cautions. Allergy vaccine can be stopped at any point and she is thinking about it.

## 2011-06-24 ENCOUNTER — Ambulatory Visit
Admission: RE | Admit: 2011-06-24 | Discharge: 2011-06-24 | Disposition: A | Discharge status: Home / Self Care | Payer: Medicare Other | Source: Ambulatory Visit | Attending: Nurse Practitioner | Admitting: Nurse Practitioner

## 2011-06-24 ENCOUNTER — Telehealth: Payer: Self-pay | Admitting: *Deleted

## 2011-06-24 ENCOUNTER — Ambulatory Visit (INDEPENDENT_AMBULATORY_CARE_PROVIDER_SITE_OTHER): Payer: Medicare Other | Admitting: Nurse Practitioner

## 2011-06-24 ENCOUNTER — Encounter: Payer: Self-pay | Admitting: Nurse Practitioner

## 2011-06-24 ENCOUNTER — Ambulatory Visit: Payer: Medicare Other | Admitting: Cardiology

## 2011-06-24 VITALS — BP 118/58 | HR 72 | Ht 64.0 in | Wt 128.8 lb

## 2011-06-24 DIAGNOSIS — R0600 Dyspnea, unspecified: Secondary | ICD-10-CM

## 2011-06-24 DIAGNOSIS — R0609 Other forms of dyspnea: Secondary | ICD-10-CM

## 2011-06-24 DIAGNOSIS — R0989 Other specified symptoms and signs involving the circulatory and respiratory systems: Secondary | ICD-10-CM

## 2011-06-24 DIAGNOSIS — I2510 Atherosclerotic heart disease of native coronary artery without angina pectoris: Secondary | ICD-10-CM

## 2011-06-24 DIAGNOSIS — I509 Heart failure, unspecified: Secondary | ICD-10-CM

## 2011-06-24 LAB — CBC WITH DIFFERENTIAL/PLATELET
Basophils Absolute: 0 10*3/uL (ref 0.0–0.1)
Basophils Relative: 0.4 % (ref 0.0–3.0)
Eosinophils Absolute: 0.1 10*3/uL (ref 0.0–0.7)
Eosinophils Relative: 1.7 % (ref 0.0–5.0)
HCT: 38.2 % (ref 36.0–46.0)
Hemoglobin: 12.7 g/dL (ref 12.0–15.0)
Lymphocytes Relative: 34 % (ref 12.0–46.0)
Lymphs Abs: 2.8 10*3/uL (ref 0.7–4.0)
MCHC: 33.3 g/dL (ref 30.0–36.0)
MCV: 88.9 fl (ref 78.0–100.0)
Monocytes Absolute: 0.8 10*3/uL (ref 0.1–1.0)
Monocytes Relative: 9.4 % (ref 3.0–12.0)
Neutro Abs: 4.6 10*3/uL (ref 1.4–7.7)
Neutrophils Relative %: 54.5 % (ref 43.0–77.0)
Platelets: 199 10*3/uL (ref 150.0–400.0)
RBC: 4.3 Mil/uL (ref 3.87–5.11)
RDW: 14.1 % (ref 11.5–14.6)
WBC: 8.4 10*3/uL (ref 4.5–10.5)

## 2011-06-24 LAB — BASIC METABOLIC PANEL
BUN: 23 mg/dL (ref 6–23)
CO2: 31 mEq/L (ref 19–32)
Calcium: 9.4 mg/dL (ref 8.4–10.5)
Chloride: 100 mEq/L (ref 96–112)
Creatinine, Ser: 0.9 mg/dL (ref 0.4–1.2)
GFR: 60.88 mL/min (ref >60.00)
Glucose, Bld: 100 mg/dL — ABNORMAL HIGH (ref 70–99)
Potassium: 4.3 mEq/L (ref 3.5–5.1)
Sodium: 141 mEq/L (ref 135–145)

## 2011-06-24 LAB — BRAIN NATRIURETIC PEPTIDE: Pro B Natriuretic peptide (BNP): 179 pg/mL — ABNORMAL HIGH (ref 0.0–100.0)

## 2011-06-24 NOTE — Assessment & Plan Note (Signed)
No chest pain. Remote CABG in 2000.

## 2011-06-24 NOTE — Telephone Encounter (Signed)
Message copied by Burnell Blanks on Mon Jun 24, 2011  5:50 PM ------      Message from: Cassell Clement      Created: Mon Jun 24, 2011  5:45 PM       Please report the patient.  Her chest x-ray is satisfactory.  No congestive heart failure.  Borderline cardiomegaly as before.  Continue present medication.

## 2011-06-24 NOTE — Progress Notes (Signed)
Advised of xray 

## 2011-06-24 NOTE — Progress Notes (Signed)
Jill Howell Date of Birth: September 07, 1919   History of Present Illness: Jill Howell is seen today for a work in visit. She is seen for Dr. Patty Sermons. She went to see her allergist Dr. Maple Hudson for her yearly check last Friday. He said she might have some fluid in her lungs. She has increased her Lasix to a whole tablet. Doesn't really seem short of breath. She describes a tightness around her waist, especially with driving. Has been going on for several months.  Also noted with deep inspiration. No cough or swelling. No PND or orthopnea. No chest pain. She reports having midsternal chest pain prior to her CABG 12 years ago. 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   Current Outpatient Prescriptions on File Prior to Visit  Medication Sig Dispense Refill  . AMBULATORY NON FORMULARY MEDICATION every 21 ( twenty-one) days. Medication Name: Allergy injection 1:10 Nurse neighbor (W-E)       . Ascorbic Acid (VITAMIN C) 1000 MG tablet Take 1,000 mg by mouth daily.        [name] aspirin 81 MG EC tablet Take 81 mg by mouth daily.        [name] atenolol (TENORMIN) 25 MG tablet TAKE 1/2 TABLET TWICE A DAY  30 tablet  2  . EPINEPHrine (EPIPEN JR) 0.15 MG/0.3ML injection Inject 0.15 mg into the muscle as needed.        . fexofenadine (ALLEGRA) 60 MG tablet Take 60 mg by mouth 2 (two) times daily as needed.        . furosemide (LASIX) 40 MG tablet Take 1 tablet (40 mg total) by mouth daily.  90 tablet  3  . Insulin Syringe-Needle U-100 (INSULIN SYRINGE 1CC/28G) 28G X 1/2" 1 ML MISC by Does not apply route as directed.        [name] LANOXIN 0.125 MG tablet TAKE 1 TABLET DAILY  90 tablet  3  . pantoprazole (PROTONIX) 40 MG tablet Take 1 tablet (40 mg total) by mouth daily.  90 tablet  3  . pyridoxine (B-6) 200 MG tablet Take 200 mg by mouth daily.        . simvastatin (ZOCOR) 40 MG tablet Take 1 tablet (40 mg total) by mouth every evening.  90 tablet  3  . SYNTHROID 125 MCG tablet TAKE 1 TABLET EVERY DAY  90 tablet  3    Allergies    Allergen Reactions  . Penicillins     REACTION: ?    Past Medical History  Diagnosis Date  . Acute upper respiratory infections of unspecified site   . Unspecified hearing loss   . Unspecified hypothyroidism   . Other and unspecified hyperlipidemia   . Esophageal reflux   . Type II or unspecified type diabetes mellitus without mention of complication, not stated as uncontrolled   . Coronary atherosclerosis of unspecified type of vessel, native or graft     s/p CABG 2000  . MI (myocardial infarction) 2000; 2011  . Congestive heart failure, unspecified   . Atrial fibrillation   . Unspecified asthma   . Allergic rhinitis, cause unspecified     Past Surgical History  Procedure Date  . Tonsillectomy   . Partial hysterectomy   . Dilation and curettage of uterus   . Bladder tack     x 2  . Rectocele repair     x 2  . Cholecystectomy 10/2001  . Rotator cuff repair   . Coronary artery bypass graft 2000  4 vessel  . Hemorrhoid surgery     History  Smoking status  . Never Smoker   Smokeless tobacco  . Not on file    History  Alcohol Use No    Family History  Problem Relation Age of Onset  . Heart attack Father     Review of Systems: The review of systems is positive for arthritis. No falls. No fever or chills.  All other systems were reviewed and are negative.  Physical Exam: BP 118/58  Pulse 72  Ht 5\' 4"  (1.626 m)  Wt 128 lb 12.8 oz (58.423 kg)  BMI 22.11 kg/m2 Patient is very pleasant and in no acute distress. She looks younger than her stated age. Skin is warm and dry. Color is normal.  HEENT is unremarkable. Normocephalic/atraumatic. PERRL. Sclera are nonicteric. Neck is supple. No masses. No JVD. Lungs are clear to me. Cardiac exam shows a regular rate and rhythm. Abdomen is soft. Extremities are without edema. Gait and ROM are intact. No gross neurologic deficits noted.   LABORATORY DATA: PENDING   Assessment / Plan:

## 2011-06-24 NOTE — Telephone Encounter (Signed)
Advised of xray results 

## 2011-06-24 NOTE — Patient Instructions (Addendum)
We are going to send you for a CXR today at [facility]  We are going to check some labs today.  Stay on your Lasix every day for the next one week.

## 2011-06-24 NOTE — Assessment & Plan Note (Signed)
She is having some atypical symptoms. We will send her for a CXR today and will check labs today as well. I have left her on her full dose of Lasix for now. Further disposition to follow. Patient is agreeable to this plan and will call if any problems develop in the interim.

## 2011-06-27 ENCOUNTER — Telehealth: Payer: Self-pay | Admitting: *Deleted

## 2011-06-27 NOTE — Progress Notes (Signed)
Advised of labs 

## 2011-06-27 NOTE — Telephone Encounter (Signed)
Advised of labs 

## 2011-06-27 NOTE — Telephone Encounter (Signed)
Message copied by Burnell Blanks on Thu Jun 27, 2011  8:23 AM ------      Message from: Rosalio Macadamia      Created: Tue Jun 25, 2011  8:04 AM       Ok to report. Labs are satisfactory. Stay on the whole tablet of Lasix. CXR was ok.

## 2011-07-03 ENCOUNTER — Encounter: Payer: Self-pay | Admitting: Cardiology

## 2011-07-17 ENCOUNTER — Other Ambulatory Visit: Payer: Self-pay | Admitting: Cardiology

## 2011-07-17 NOTE — Telephone Encounter (Signed)
Refilled atenolol

## 2011-08-15 ENCOUNTER — Encounter: Payer: Self-pay | Admitting: Cardiology

## 2011-08-19 ENCOUNTER — Other Ambulatory Visit: Payer: Self-pay | Admitting: Cardiology

## 2011-08-19 ENCOUNTER — Ambulatory Visit (INDEPENDENT_AMBULATORY_CARE_PROVIDER_SITE_OTHER): Payer: Medicare Other | Admitting: Cardiology

## 2011-08-19 ENCOUNTER — Other Ambulatory Visit: Payer: Medicare Other | Admitting: *Deleted

## 2011-08-19 ENCOUNTER — Encounter: Payer: Self-pay | Admitting: Cardiology

## 2011-08-19 VITALS — BP 110/70 | HR 70 | Ht 63.0 in | Wt 130.0 lb

## 2011-08-19 DIAGNOSIS — R0600 Dyspnea, unspecified: Secondary | ICD-10-CM

## 2011-08-19 DIAGNOSIS — E039 Hypothyroidism, unspecified: Secondary | ICD-10-CM

## 2011-08-19 DIAGNOSIS — Z9889 Other specified postprocedural states: Secondary | ICD-10-CM

## 2011-08-19 DIAGNOSIS — E7800 Pure hypercholesterolemia, unspecified: Secondary | ICD-10-CM

## 2011-08-19 DIAGNOSIS — Z951 Presence of aortocoronary bypass graft: Secondary | ICD-10-CM

## 2011-08-19 DIAGNOSIS — R0989 Other specified symptoms and signs involving the circulatory and respiratory systems: Secondary | ICD-10-CM

## 2011-08-19 DIAGNOSIS — I2510 Atherosclerotic heart disease of native coronary artery without angina pectoris: Secondary | ICD-10-CM

## 2011-08-19 DIAGNOSIS — I779 Disorder of arteries and arterioles, unspecified: Secondary | ICD-10-CM | POA: Insufficient documentation

## 2011-08-19 DIAGNOSIS — Z1231 Encounter for screening mammogram for malignant neoplasm of breast: Secondary | ICD-10-CM

## 2011-08-19 LAB — HEPATIC FUNCTION PANEL
ALT: 28 U/L (ref 0–35)
AST: 28 U/L (ref 0–37)
Total Bilirubin: 0.8 mg/dL (ref 0.3–1.2)
Total Protein: 6.8 g/dL (ref 6.0–8.3)

## 2011-08-19 LAB — BASIC METABOLIC PANEL
BUN: 14 mg/dL (ref 6–23)
Calcium: 8.3 mg/dL — ABNORMAL LOW (ref 8.4–10.5)
GFR: 66.68 mL/min (ref >60.00)
Glucose, Bld: 103 mg/dL — ABNORMAL HIGH (ref 70–99)
Sodium: 140 mEq/L (ref 135–145)

## 2011-08-19 LAB — LIPID PANEL
Cholesterol: 122 mg/dL (ref 0–200)
HDL: 31.9 mg/dL — ABNORMAL LOW (ref >39.00)
VLDL: 27.6 mg/dL (ref 0.0–40.0)

## 2011-08-19 NOTE — Progress Notes (Signed)
Alajah S Venning Date of Birth:  [birth year] [facility] [address]   [address] [place], [place]  [address] [phone]           Fax   [phone]  History of Present Illness: This pleasant 77 year old woman is seen for a scheduled followup office visit.  She has a history of ischemic heart disease.  She underwent coronary artery bypass graft surgery in 2000.  She has a past history of diastolic congestive heart failure also has a history of high cholesterol.  She is not a diabetic.  She has had gastroesophageal reflux disease and is on Protonix.  As a history of hypothyroidism.  Current Outpatient Prescriptions  Medication Sig Dispense Refill  . AMBULATORY NON FORMULARY MEDICATION every 21 ( twenty-one) days. Medication Name: Allergy injection 1:10 Nurse neighbor (W-E)       . Ascorbic Acid (VITAMIN C) 1000 MG tablet Take 1,000 mg by mouth daily.        [name] aspirin 81 MG EC tablet Take 81 mg by mouth daily.        [name] atenolol (TENORMIN) 25 MG tablet TAKE 1/2 TABLET TWICE A DAY  30 tablet  2  . EPINEPHrine (EPIPEN JR) 0.15 MG/0.3ML injection Inject 0.15 mg into the muscle as needed.        . fexofenadine (ALLEGRA) 60 MG tablet Take 60 mg by mouth 2 (two) times daily as needed.        . furosemide (LASIX) 40 MG tablet Take 1 tablet (40 mg total) by mouth daily.  90 tablet  3  . Insulin Syringe-Needle U-100 (INSULIN SYRINGE 1CC/28G) 28G X 1/2" 1 ML MISC by Does not apply route as directed.        [name] LANOXIN 0.125 MG tablet TAKE 1 TABLET DAILY  90 tablet  3  . pantoprazole (PROTONIX) 40 MG tablet Take 1 tablet (40 mg total) by mouth daily.  90 tablet  3  . pyridoxine (B-6) 200 MG tablet Take 200 mg by mouth daily.        . simvastatin (ZOCOR) 40 MG tablet Take 1 tablet (40 mg total) by mouth every evening.  90 tablet  3  . SYNTHROID 125 MCG tablet TAKE 1 TABLET EVERY DAY  90 tablet  3    Allergies  Allergen Reactions  . Penicillins     REACTION: ?    Patient  Active Problem List  Diagnoses  . HYPOTHYROIDISM  . DIABETES MELLITUS, TYPE II  . HYPERLIPIDEMIA  . HEARING IMPAIRMENT  . CORONARY ARTERY DISEASE  . ATRIAL FIBRILLATION  . CONGESTIVE HEART FAILURE  . ALLERGIC RHINITIS  . ASTHMA  . GERD  . UTI (lower urinary tract infection)  . Dermatitis    History  Smoking status  . Never Smoker   Smokeless tobacco  . Not on file    History  Alcohol Use No    Family History  Problem Relation Age of Onset  . Heart attack Father     Review of Systems: Constitutional: no fever chills diaphoresis or fatigue or change in weight.  Head and neck: no hearing loss, no epistaxis, no photophobia or visual disturbance. Respiratory: No cough, shortness of breath or wheezing. Cardiovascular: No chest pain peripheral edema, palpitations. Gastrointestinal: No abdominal distention, no abdominal pain, no change in bowel habits hematochezia or melena. Genitourinary: No dysuria, no frequency, no urgency, no nocturia. Musculoskeletal:No arthralgias, no back pain, no gait disturbance or myalgias. Neurological: No dizziness, no  headaches, no numbness, no seizures, no syncope, no weakness, no tremors. Hematologic: No lymphadenopathy, no easy bruising. Psychiatric: No confusion, no hallucinations, no sleep disturbance.    Physical Exam: Filed Vitals:   08/19/11 0854  BP: 110/70  Pulse: 70   the general appearance reveals a well-developed elderly alert woman in no distress.  Head and neck exam reveals that she has a prominent left carotid systolic bruit.  The jugular venous pressure is normal.  Mouth and pharynx normal.  Pupils equal and reactive.  Sclerae nonicteric.The chest is clear to percussion and auscultation. There are no rales or rhonchi. Expansion of the chest is symmetrical.  The precordium is quiet.  The first heart sound is normal.  The second heart sound is physiologically split.  There is no murmur gallop rub or click.  There is no abnormal  lift or heave.The abdomen is soft and nontender. Bowel sounds are normal. The liver and spleen are not enlarged. There Are no abdominal masses. There are no bruits.  The pedal pulses are good.  There is no phlebitis or edema.  There is no cyanosis or clubbing. Strength is normal and symmetrical in all extremities.  There is no lateralizing weakness.  There are no sensory deficits.       Assessment / Plan: Continue same medication.  Check carotid Dopplers.  Recheck in 4 months for followup office visit and fasting lab work

## 2011-08-19 NOTE — Assessment & Plan Note (Signed)
The patient has a prominent bruit over her left carotid.  I did not notice that on my previous examinations.  The patient states that we had not discussed any carotid bruits in the past.  She has not been experiencing any TIA symptoms.  We will plan to get carotid Dopplers

## 2011-08-19 NOTE — Patient Instructions (Signed)
Your physician has requested that you have a carotid duplex. This test is an ultrasound of the carotid arteries in your neck. It looks at blood flow through these arteries that supply the brain with blood. Allow one hour for this exam. There are no restrictions or special instructions. Your physician recommends that you continue on your current medications as directed. Please refer to the Current Medication list given to you today. Your physician wants you to follow-up in: 4 months You will receive a reminder letter in the mail two months in advance. If you don't receive a letter, please call our office to schedule the follow-up appointment.

## 2011-08-19 NOTE — Assessment & Plan Note (Signed)
The patient has not been expressing any chest pain or angina. 

## 2011-08-19 NOTE — Assessment & Plan Note (Signed)
The patient is clinically euthyroid on her current therapy 

## 2011-08-20 ENCOUNTER — Telehealth: Payer: Self-pay | Admitting: *Deleted

## 2011-08-20 NOTE — Telephone Encounter (Signed)
Message copied by Burnell Blanks on Tue Aug 20, 2011  3:38 PM ------      Message from: Cassell Clement      Created: Mon Aug 19, 2011  9:05 PM       Please report.  The labs are stable.  Continue same meds.  Continue careful diet. TGs are better.

## 2011-08-20 NOTE — Telephone Encounter (Signed)
Advised of labs 

## 2011-09-06 ENCOUNTER — Encounter (INDEPENDENT_AMBULATORY_CARE_PROVIDER_SITE_OTHER): Payer: Medicare Other | Admitting: *Deleted

## 2011-09-06 DIAGNOSIS — R0989 Other specified symptoms and signs involving the circulatory and respiratory systems: Secondary | ICD-10-CM

## 2011-09-06 DIAGNOSIS — I6529 Occlusion and stenosis of unspecified carotid artery: Secondary | ICD-10-CM

## 2011-09-12 ENCOUNTER — Telehealth: Payer: Self-pay | Admitting: *Deleted

## 2011-09-12 NOTE — Telephone Encounter (Signed)
Message copied by Burnell Blanks on Thu Sep 12, 2011  2:16 PM ------      Message from: Cassell Clement      Created: Wed Sep 11, 2011  1:13 PM       Please report.  The carotid Doppler does not show any severe stenosis that would require surgery.  Continue same medication.

## 2011-09-12 NOTE — Telephone Encounter (Signed)
Advised of results

## 2011-09-13 DIAGNOSIS — H353190 Nonexudative age-related macular degeneration, unspecified eye, stage unspecified: Secondary | ICD-10-CM | POA: Diagnosis not present

## 2011-09-24 ENCOUNTER — Telehealth: Payer: Self-pay | Admitting: Internal Medicine

## 2011-09-24 DIAGNOSIS — M6530 Trigger finger, unspecified finger: Secondary | ICD-10-CM | POA: Diagnosis not present

## 2011-09-24 NOTE — Telephone Encounter (Signed)
Pt says she has been having increased fatigue, sneezing and cough with yellow mucus production for 2 months and wants to know if she should start back on her allergy injections. She has not had an injection in over 1 month. Pls advise. Allergies  Allergen Reactions  . Penicillins     REACTION: ?

## 2011-09-24 NOTE — Telephone Encounter (Signed)
ATC no answer and unable to leave message.  

## 2011-09-24 NOTE — Telephone Encounter (Signed)
Jill Howell- could you ask her to try taking loratadine/ Claritin daily for a week or so, to see what effect that has before we consider restarting her allergy shots.

## 2011-09-25 ENCOUNTER — Other Ambulatory Visit: Payer: Self-pay | Admitting: Cardiology

## 2011-09-25 NOTE — Telephone Encounter (Signed)
ATC x 1. Line busy. WCB. 

## 2011-09-25 NOTE — Telephone Encounter (Signed)
Refilled simvastatin.

## 2011-09-25 NOTE — Telephone Encounter (Signed)
I spoke with patient-aware of CY's recs; she states she was told by her MD not to use Loratadine due to her heart attacks and 4 bypass surgeries; was cleared to use Mucinex and Allegra; I told patient to use those meds as she was told by other MD; if no better in about a week or so then call our office. Will sign off on message but forward to CY as FYI.

## 2011-11-04 ENCOUNTER — Telehealth: Payer: Self-pay | Admitting: Cardiology

## 2011-11-04 NOTE — Telephone Encounter (Signed)
Patient not able to really look at it very well since it is on back of leg.  States it is bruised but unable to tell if it is a bite or red streaks.  Advised should call her PCP to get checked out.  Also has a "fatty tumor" on her arm she is concerned about.  Advised really needed to discuss these with her PCP

## 2011-11-04 NOTE — Telephone Encounter (Signed)
Pt has two knots on right leg about the size of a marble since about  a week ago, pls call [phone]

## 2011-11-11 DIAGNOSIS — D179 Benign lipomatous neoplasm, unspecified: Secondary | ICD-10-CM | POA: Diagnosis not present

## 2011-11-21 DIAGNOSIS — IMO0002 Reserved for concepts with insufficient information to code with codable children: Secondary | ICD-10-CM | POA: Diagnosis not present

## 2011-11-21 DIAGNOSIS — M542 Cervicalgia: Secondary | ICD-10-CM | POA: Diagnosis not present

## 2011-11-27 DIAGNOSIS — M25529 Pain in unspecified elbow: Secondary | ICD-10-CM | POA: Diagnosis not present

## 2011-11-27 DIAGNOSIS — M542 Cervicalgia: Secondary | ICD-10-CM | POA: Diagnosis not present

## 2011-11-29 ENCOUNTER — Other Ambulatory Visit: Payer: Self-pay | Admitting: Cardiology

## 2011-11-29 DIAGNOSIS — M542 Cervicalgia: Secondary | ICD-10-CM | POA: Diagnosis not present

## 2011-11-29 DIAGNOSIS — D179 Benign lipomatous neoplasm, unspecified: Secondary | ICD-10-CM | POA: Diagnosis not present

## 2011-12-02 NOTE — Telephone Encounter (Signed)
Refilled digoxin 

## 2011-12-12 ENCOUNTER — Ambulatory Visit (INDEPENDENT_AMBULATORY_CARE_PROVIDER_SITE_OTHER): Payer: Medicare Other | Admitting: Cardiology

## 2011-12-12 ENCOUNTER — Ambulatory Visit (INDEPENDENT_AMBULATORY_CARE_PROVIDER_SITE_OTHER): Payer: Medicare Other | Admitting: *Deleted

## 2011-12-12 ENCOUNTER — Encounter: Payer: Self-pay | Admitting: Cardiology

## 2011-12-12 VITALS — BP 138/70 | HR 66 | Ht 64.0 in | Wt 130.0 lb

## 2011-12-12 DIAGNOSIS — I4891 Unspecified atrial fibrillation: Secondary | ICD-10-CM

## 2011-12-12 DIAGNOSIS — I2510 Atherosclerotic heart disease of native coronary artery without angina pectoris: Secondary | ICD-10-CM

## 2011-12-12 DIAGNOSIS — E039 Hypothyroidism, unspecified: Secondary | ICD-10-CM | POA: Diagnosis not present

## 2011-12-12 DIAGNOSIS — I509 Heart failure, unspecified: Secondary | ICD-10-CM

## 2011-12-12 DIAGNOSIS — E785 Hyperlipidemia, unspecified: Secondary | ICD-10-CM

## 2011-12-12 DIAGNOSIS — E7800 Pure hypercholesterolemia, unspecified: Secondary | ICD-10-CM | POA: Diagnosis not present

## 2011-12-12 LAB — HEPATIC FUNCTION PANEL
ALT: 25 U/L (ref 0–35)
Albumin: 4.1 g/dL (ref 3.5–5.2)
Alkaline Phosphatase: 67 U/L (ref 39–117)
Bilirubin, Direct: 0.1 mg/dL (ref 0.0–0.3)
Total Protein: 7.1 g/dL (ref 6.0–8.3)

## 2011-12-12 LAB — LDL CHOLESTEROL, DIRECT: Direct LDL: 69.8 mg/dL

## 2011-12-12 LAB — BASIC METABOLIC PANEL
BUN: 18 mg/dL (ref 6–23)
Calcium: 9.1 mg/dL (ref 8.4–10.5)
Creatinine, Ser: 0.8 mg/dL (ref 0.4–1.2)
GFR: 68.49 mL/min (ref >60.00)

## 2011-12-12 LAB — LIPID PANEL
Cholesterol: 170 mg/dL (ref 0–200)
HDL: 36.9 mg/dL — ABNORMAL LOW (ref >39.00)

## 2011-12-12 NOTE — Assessment & Plan Note (Signed)
The patient has hyperlipidemia and is on simvastatin 40 mg daily.  She is not having any myalgias from the simvastatin.  We are checking blood work today.

## 2011-12-12 NOTE — Progress Notes (Signed)
Quick Note:  Please report to patient. The recent labs are stable. Continue same medication and careful diet. The cholesterol and triglycerides are higher so work harder on diet and a low carbohydrate regimen ______

## 2011-12-12 NOTE — Progress Notes (Signed)
Jill Howell Date of Birth:  13-Sep-1918 [facility] [address] [place], [place]  [address] [phone]         Fax   [phone]  History of Present Illness: This pleasant 78 year old woman is seen for a scheduled followup office visit.  She has a history of ischemic heart disease.  She underwent coronary artery bypass graft surgery in 2000.  She has a history of diastolic congestive heart failure.  She has a history of high cholesterol.  She is not diabetic.  She does have a history of GERD and is on Protonix.  She also has a history of underactive thyroid gland.  Recently she has had some discomfort in her left neck.  We did carotid Dopplers which showed no severe stenosis.  She does have a left carotid bruit and Doppler showed a maximum 60-79% stenosis on the left and a lesser degree of stenosis on the right.  She has seen her orthopedist in her hand surgeon concerning radicular pain from the left neck down into the left arm.  She has had an MRI and is awaiting the results.  Current Outpatient Prescriptions  Medication Sig Dispense Refill  . AMBULATORY NON FORMULARY MEDICATION every 21 ( twenty-one) days. Medication Name: Allergy injection 1:10 Nurse neighbor (W-E)       . Ascorbic Acid (VITAMIN C) 1000 MG tablet Take 1,000 mg by mouth daily.        [name] aspirin 81 MG EC tablet Take 81 mg by mouth daily.        [name] atenolol (TENORMIN) 25 MG tablet TAKE 1/2 TABLET TWICE A DAY  30 tablet  2  . digoxin (LANOXIN) 0.125 MG tablet TAKE 1 TABLET DAILY  90 tablet  3  . EPINEPHrine (EPIPEN JR) 0.15 MG/0.3ML injection Inject 0.15 mg into the muscle as needed.        . fexofenadine (ALLEGRA) 60 MG tablet Take 60 mg by mouth 2 (two) times daily as needed.        . furosemide (LASIX) 40 MG tablet Take 1 tablet (40 mg total) by mouth daily.  90 tablet  3  . Insulin Syringe-Needle U-100 (INSULIN SYRINGE 1CC/28G) 28G X 1/2" 1 ML MISC by Does not apply route as directed.        .  pantoprazole (PROTONIX) 40 MG tablet Take 1 tablet (40 mg total) by mouth daily.  90 tablet  3  . pyridoxine (B-6) 200 MG tablet Take 200 mg by mouth daily.        . simvastatin (ZOCOR) 40 MG tablet TAKE 1 TABLET (40 MG TOTAL) BY MOUTH EVERY EVENING.  90 tablet  3  . SYNTHROID 125 MCG tablet TAKE 1 TABLET EVERY DAY  90 tablet  3    Allergies  Allergen Reactions  . Penicillins     REACTION: ?    Patient Active Problem List  Diagnoses  . HYPOTHYROIDISM  . DIABETES MELLITUS, TYPE II  . HYPERLIPIDEMIA  . HEARING IMPAIRMENT  . CORONARY ARTERY DISEASE  . ATRIAL FIBRILLATION  . CONGESTIVE HEART FAILURE  . ALLERGIC RHINITIS  . ASTHMA  . GERD  . UTI (lower urinary tract infection)  . Dermatitis  . Left carotid bruit    History  Smoking status  . Never Smoker   Smokeless tobacco  . Not on file    History  Alcohol Use No    Family History  Problem Relation Age of Onset  . Heart attack Father  Review of Systems: Constitutional: no fever chills diaphoresis or fatigue or change in weight.  Head and neck: no hearing loss, no epistaxis, no photophobia or visual disturbance. Respiratory: No cough, shortness of breath or wheezing. Cardiovascular: No chest pain peripheral edema, palpitations. Gastrointestinal: No abdominal distention, no abdominal pain, no change in bowel habits hematochezia or melena. Genitourinary: No dysuria, no frequency, no urgency, no nocturia. Musculoskeletal:No arthralgias, no back pain, no gait disturbance or myalgias. Neurological: No dizziness, no headaches, no numbness, no seizures, no syncope, no weakness, no tremors. Hematologic: No lymphadenopathy, no easy bruising. Psychiatric: No confusion, no hallucinations, no sleep disturbance.    Physical Exam: Filed Vitals:   12/12/11 0924  BP: 138/70  Pulse: 66   the general appearance reveals a well-developed well-nourished woman in no distress.Pupils equal and reactive.   Extraocular Movements  are full.  There is no scleral icterus.  The mouth and pharynx are normal.  The neck is supple.  The carotids reveal high-pitched left carotid bruit. The jugular venous pressure is normal.  The thyroid is not enlarged.  There is no lymphadenopathy.The chest is clear to percussion and auscultation. There are no rales or rhonchi. Expansion of the chest is symmetrical.  The heart reveals a soft apical murmur of mitral regurgitation. The abdomen is soft and nontender. Bowel sounds are normal. The liver and spleen are not enlarged. There Are no abdominal masses. There are no bruits.  The pedal pulses are good.  There is no phlebitis or edema.  There is no cyanosis or clubbing. Strength is normal and symmetrical in all extremities.  There is no lateralizing weakness.  There are no sensory deficits.  The skin is warm and dry.  There is no rash.      Assessment / Plan: Continue same medication.  Blood work is pending today.  Recheck in 4 months for followup office visit EKG and fasting lipid panel hepatic function panel nasal metabolic panel and TSH.

## 2011-12-12 NOTE — Patient Instructions (Signed)
Your physician wants you to follow-up in: 4 months. You will receive a reminder letter in the mail two months in advance. If you don't receive a letter, please call our office to schedule the follow-up appointment.  Your physician recommends that you return for lab work in: 4 months.  Lipids, Liver, BMET & TSH

## 2011-12-12 NOTE — Assessment & Plan Note (Signed)
The patient is not having any symptoms of congestive heart failure 

## 2011-12-12 NOTE — Assessment & Plan Note (Signed)
The patient had an episode of the left lateral chest pain 2 days ago and took a sublingual nitroglycerin.  She rarely has to take any nitroglycerin

## 2011-12-12 NOTE — Assessment & Plan Note (Signed)
The patient is clinically euthyroid.  She is on Synthroid and we will plan to get a TSH at her next visit

## 2011-12-17 ENCOUNTER — Telehealth: Payer: Self-pay | Admitting: *Deleted

## 2011-12-17 NOTE — Telephone Encounter (Signed)
No answer, mailed copy with note to call if any questions.  Highlighted  Dr. Yevonne Pax comments

## 2011-12-17 NOTE — Telephone Encounter (Signed)
Message copied by Burnell Blanks on Tue Dec 17, 2011 10:31 AM ------      Message from: Cassell Clement      Created: Thu Dec 12, 2011  5:02 PM       Please report to patient.  The recent labs are stable. Continue same medication and careful diet.  The cholesterol and triglycerides are higher so work harder on diet and a low carbohydrate regimen

## 2011-12-23 ENCOUNTER — Other Ambulatory Visit: Payer: Self-pay | Admitting: *Deleted

## 2011-12-23 MED ORDER — ATENOLOL 25 MG PO TABS: ORAL_TABLET | ORAL | Status: DC

## 2011-12-23 NOTE — Telephone Encounter (Signed)
Refilled atenolol

## 2012-01-01 ENCOUNTER — Other Ambulatory Visit: Payer: Self-pay | Admitting: Cardiology

## 2012-02-10 ENCOUNTER — Other Ambulatory Visit: Payer: Self-pay | Admitting: Cardiology

## 2012-03-02 ENCOUNTER — Other Ambulatory Visit: Payer: Self-pay | Admitting: Cardiology

## 2012-03-02 NOTE — Telephone Encounter (Signed)
Refilled digoxin 

## 2012-04-10 ENCOUNTER — Encounter: Payer: Self-pay | Admitting: Cardiology

## 2012-04-10 ENCOUNTER — Ambulatory Visit (INDEPENDENT_AMBULATORY_CARE_PROVIDER_SITE_OTHER): Payer: Medicare Other | Admitting: Cardiology

## 2012-04-10 VITALS — BP 145/67 | HR 64 | Ht 64.0 in | Wt 126.8 lb

## 2012-04-10 DIAGNOSIS — E039 Hypothyroidism, unspecified: Secondary | ICD-10-CM | POA: Diagnosis not present

## 2012-04-10 DIAGNOSIS — I2510 Atherosclerotic heart disease of native coronary artery without angina pectoris: Secondary | ICD-10-CM

## 2012-04-10 DIAGNOSIS — R5381 Other malaise: Secondary | ICD-10-CM | POA: Diagnosis not present

## 2012-04-10 DIAGNOSIS — I119 Hypertensive heart disease without heart failure: Secondary | ICD-10-CM

## 2012-04-10 DIAGNOSIS — R5383 Other fatigue: Secondary | ICD-10-CM | POA: Diagnosis not present

## 2012-04-10 DIAGNOSIS — R0989 Other specified symptoms and signs involving the circulatory and respiratory systems: Secondary | ICD-10-CM

## 2012-04-10 DIAGNOSIS — R531 Weakness: Secondary | ICD-10-CM

## 2012-04-10 LAB — HEPATIC FUNCTION PANEL
ALT: 23 U/L (ref 0–35)
AST: 25 U/L (ref 0–37)
Alkaline Phosphatase: 63 U/L (ref 39–117)
Bilirubin, Direct: 0.1 mg/dL (ref 0.0–0.3)
Total Bilirubin: 1.2 mg/dL (ref 0.3–1.2)

## 2012-04-10 LAB — BASIC METABOLIC PANEL
BUN: 22 mg/dL (ref 6–23)
Calcium: 9.2 mg/dL (ref 8.4–10.5)
Creatinine, Ser: 1.1 mg/dL (ref 0.4–1.2)
GFR: 49.45 mL/min — ABNORMAL LOW (ref >60.00)
Glucose, Bld: 103 mg/dL — ABNORMAL HIGH (ref 70–99)
Potassium: 4.4 mEq/L (ref 3.5–5.1)

## 2012-04-10 MED ORDER — DIPHENOXYLATE-ATROPINE 2.5-0.025 MG PO TABS: 1.0000 | ORAL_TABLET | Freq: Four times a day (QID) | ORAL | Status: AC | PRN

## 2012-04-10 MED ORDER — FEXOFENADINE HCL 60 MG PO TABS: 60.0000 mg | ORAL_TABLET | Freq: Two times a day (BID) | ORAL | Status: DC | PRN

## 2012-04-10 NOTE — Assessment & Plan Note (Signed)
The patient has not had any recurrent angina pectoris.  He is getting by with a very small dose of beta blocker just 12.5 mg of atenolol once a day

## 2012-04-10 NOTE — Assessment & Plan Note (Signed)
The patient is clinically euthyroid on current therapy.  We are checking a TSH today.  The patient is concerned whether she should stay on the same dose or might need a different to

## 2012-04-10 NOTE — Assessment & Plan Note (Signed)
No TIA symptoms.  She remains on baby aspirin daily

## 2012-04-10 NOTE — Progress Notes (Signed)
Jill Howell Date of Birth:  1918/10/07 [facility] [address] [place], [place]  [address] [phone]         Fax   [phone]  History of Present Illness: This pleasant 78 year old woman is seen for a scheduled four-month followup office visit.  She has a history of ischemic heart disease.  She had coronary artery bypass graft surgery in 2000.  She has a history of diastolic congestive heart failure and high cholesterol.  She said GERD and is on Protonix.  She has had a history of hypothyroidism.  He has a known left carotid bruit.  She has had no recent TIA symptoms.  Current Outpatient Prescriptions  Medication Sig Dispense Refill  . Ascorbic Acid (VITAMIN C) 1000 MG tablet Take 1,000 mg by mouth daily.        [name] aspirin 81 MG EC tablet Take 81 mg by mouth daily.        [name] atenolol (TENORMIN) 25 MG tablet       . digoxin (LANOXIN) 0.125 MG tablet TAKE 1 TABLET DAILY  90 tablet  3  . fexofenadine (ALLEGRA) 60 MG tablet Take 1 tablet (60 mg total) by mouth 2 (two) times daily as needed.  60 tablet  3  . furosemide (LASIX) 40 MG tablet Take 1 tablet (40 mg total) by mouth daily.  90 tablet  3  . pantoprazole (PROTONIX) 40 MG tablet TAKE 1 TABLET (40 MG TOTAL) BY MOUTH DAILY.  90 tablet  3  . pyridoxine (B-6) 200 MG tablet Take 200 mg by mouth daily.        . simvastatin (ZOCOR) 40 MG tablet TAKE 1 TABLET (40 MG TOTAL) BY MOUTH EVERY EVENING.  90 tablet  3  . SYNTHROID 125 MCG tablet TAKE 1 TABLET EVERY DAY  90 tablet  1  . DISCONTD: atenolol (TENORMIN) 25 MG tablet TAKE 1/2 TABLET TWICE A DAY  30 tablet  5  . DISCONTD: fexofenadine (ALLEGRA) 60 MG tablet Take 60 mg by mouth 2 (two) times daily as needed.        . diphenoxylate-atropine (LOMOTIL) 2.5-0.025 MG per tablet Take 1 tablet by mouth 4 (four) times daily as needed for diarrhea or loose stools.  30 tablet  0  . DISCONTD: atenolol (TENORMIN) 25 MG tablet 1/2 tablet twice a day  90 tablet  3    Allergies    Allergen Reactions  . Penicillins     REACTION: ?    Patient Active Problem List  Diagnosis  . HYPOTHYROIDISM  . DIABETES MELLITUS, TYPE II  . HYPERLIPIDEMIA  . HEARING IMPAIRMENT  . CORONARY ARTERY DISEASE  . ATRIAL FIBRILLATION  . CONGESTIVE HEART FAILURE  . ALLERGIC RHINITIS  . ASTHMA  . GERD  . UTI (lower urinary tract infection)  . Dermatitis  . Left carotid bruit    History  Smoking status  . Never Smoker   Smokeless tobacco  . Not on file    History  Alcohol Use No    Family History  Problem Relation Age of Onset  . Heart attack Father     Review of Systems: Constitutional: no fever chills diaphoresis or fatigue or change in weight.  Head and neck: no hearing loss, no epistaxis, no photophobia or visual disturbance. Respiratory: No cough, shortness of breath or wheezing. Cardiovascular: No chest pain peripheral edema, palpitations. Gastrointestinal: No abdominal distention, no abdominal pain, no change in bowel habits hematochezia or melena. Genitourinary: No dysuria, no  frequency, no urgency, no nocturia. Musculoskeletal:No arthralgias, no back pain, no gait disturbance or myalgias. Neurological: No dizziness, no headaches, no numbness, no seizures, no syncope, no weakness, no tremors. Hematologic: No lymphadenopathy, no easy bruising. Psychiatric: No confusion, no hallucinations, no sleep disturbance.    Physical Exam: Filed Vitals:   04/10/12 0939  BP: 145/67  Pulse: 64   the general appearance reveals a well-developed elderly woman who is alert and oriented.The head and neck exam reveals pupils equal and reactive.  Extraocular movements are full.  There is no scleral icterus.  The mouth and pharynx are normal.  The neck is supple.  The carotids reveal no bruits.  The jugular venous pressure is normal.  The  thyroid is not enlarged.  There is no lymphadenopathy.  The chest is clear to percussion and auscultation.  There are no rales or rhonchi.   Expansion of the chest is symmetrical.  The precordium is quiet.  The first heart sound is normal.  The second heart sound is physiologically split.  There is no murmur gallop rub or click.  There is no abnormal lift or heave.  The abdomen is soft and nontender.  The bowel sounds are normal.  The liver and spleen are not enlarged.  There are no abdominal masses.  There are no abdominal bruits.  Extremities reveal good pedal pulses.  There is no phlebitis or edema.  There is no cyanosis or clubbing.  Strength is normal and symmetrical in all extremities.  There is no lateralizing weakness.  There are no sensory deficits.  The skin is warm and dry.  There is no rash.  EKG today shows normal sinus rhythm and an old inferior wall myocardial infarction and no ischemic changes   Assessment / Plan: Continue same medication.  Recheck in 4 months for followup office visit lipid panel hepatic function panel and basal metabolic panel

## 2012-04-10 NOTE — Patient Instructions (Signed)
Your physician recommends that you continue on your current medications as directed. Please refer to the Current Medication list given to you today.  Your physician recommends that you schedule a follow-up appointment in: 4 months with fasting labs (lp/bmet/hfp)  

## 2012-04-10 NOTE — Progress Notes (Signed)
Quick Note:  Please report to patient. The recent labs are stable. Continue same medication and careful diet. Thyroid dose is correct. ______

## 2012-04-15 ENCOUNTER — Other Ambulatory Visit: Payer: Self-pay | Admitting: Cardiology

## 2012-04-15 NOTE — Telephone Encounter (Signed)
Refilled nitrostat 

## 2012-04-16 ENCOUNTER — Telehealth: Payer: Self-pay | Admitting: *Deleted

## 2012-04-16 NOTE — Telephone Encounter (Signed)
Advised patient of lab results. Was complaining of still feeling weak, advised to contact PCP

## 2012-04-16 NOTE — Telephone Encounter (Signed)
Message copied by Burnell Blanks on Thu Apr 16, 2012  2:52 PM ------      Message from: Cassell Clement      Created: Fri Apr 10, 2012  5:28 PM       Please report to patient.  The recent labs are stable. Continue same medication and careful diet. Thyroid dose is correct.

## 2012-05-07 ENCOUNTER — Encounter: Payer: Self-pay | Admitting: Family Medicine

## 2012-05-07 ENCOUNTER — Ambulatory Visit (INDEPENDENT_AMBULATORY_CARE_PROVIDER_SITE_OTHER): Payer: Medicare Other | Admitting: Family Medicine

## 2012-05-07 VITALS — BP 144/64 | HR 73 | Temp 97.5°F | Wt 128.0 lb

## 2012-05-07 DIAGNOSIS — M549 Dorsalgia, unspecified: Secondary | ICD-10-CM | POA: Diagnosis not present

## 2012-05-07 NOTE — Patient Instructions (Addendum)
I would take tylenol for pain (1-2 tabs at a time) up to 3 times a day.  Use a heating pad and see if that helps.   Take care.

## 2012-05-07 NOTE — Assessment & Plan Note (Signed)
Likely benign MSK source, possibly from mopping. Would use tylenol and local heat, f/u prn.

## 2012-05-07 NOTE — Progress Notes (Signed)
B lower back pain.  In a band near the waist.  1 week duration.  Not improving. No dysuria.  No vomiting, no diarrhea.  No pain in the legs.  No falls, no trauma.  No abd pain.  Was mopping last week.  Hasn't taking any meds yet.  Can take tylenol but hasn't yet.  Hasn't used heat yet.    Meds, vitals, and allergies reviewed.   ROS: See HPI.  Otherwise, noncontributory.  nad ncat rrr ctab abd soft, not ttp Back w/o midline pain but B band of pain across the lower L spine at the waist.  Not worse with forward flexion or extension.  Distally nv intact. No cva pain No suprapubic pain

## 2012-06-26 ENCOUNTER — Ambulatory Visit (INDEPENDENT_AMBULATORY_CARE_PROVIDER_SITE_OTHER): Payer: Medicare Other | Admitting: Internal Medicine

## 2012-06-26 ENCOUNTER — Encounter: Payer: Self-pay | Admitting: Internal Medicine

## 2012-06-26 VITALS — BP 122/62 | HR 74 | Ht 64.0 in | Wt 131.8 lb

## 2012-06-26 DIAGNOSIS — Z23 Encounter for immunization: Secondary | ICD-10-CM | POA: Diagnosis not present

## 2012-06-26 DIAGNOSIS — J309 Allergic rhinitis, unspecified: Secondary | ICD-10-CM

## 2012-06-26 DIAGNOSIS — J45909 Unspecified asthma, uncomplicated: Secondary | ICD-10-CM

## 2012-06-26 MED ORDER — BENZONATATE 200 MG PO CAPS: 200.0000 mg | ORAL_CAPSULE | Freq: Three times a day (TID) | ORAL | Status: DC | PRN

## 2012-06-26 NOTE — Progress Notes (Signed)
Subjective:    Patient ID: Jill Howell, female    DOB: [birth year], 78 y.o.   MRN: [id number]  HPI 78 yo female with known hx of AB, AR  06/23/08- Thinks if she takes Centrum Silver vitamin, or eats her own homemade applesauce she gets a burning sensation, bilateral axillary/ ribs. No GI/GU or chest pain/palpitation otherwise. Denies sneeze, rhinorhea, wheeze or cough.  Continues allergy vaccine 1:10 every 2 weeks.   11/23/08-Asthmatic bronchitis, rhinits, CAD/CHF/AF  4-5 days hard left frontal and maxillary headache, watery rhinorhea. Ears ok, no fever or purulent discharge. Chest feels normal for her.  She is convinced allergy vaccine helps her.   June 22, 2009- Asthmatic bronchitis, rhinitis, CAD. CHF  Pain behind right ear since [place]. Has had ENT and carotid artery eval. Hearing aids.  Discussed flu shot.  Hauled dirt yesterday- still active. Morning cough with phlegm.   June 22, 2010- Asthmatic bronchitis, rhinitis, CAD/ MI. CHF  Nurse:cc Yearly follow up visit-asthma; Breathing is good per patient.  Says she has been doing well recently. She had an MI in February/ Dr Patty Sermons.  Ribs get sore when shes tired. Then she tells me she picked up 20 bushels or walnuts- showing me her stained hands. Takes an antihistamine but doesn't know what it is- says prescription.  Had mold in home- remediated. No asthma or inhaler. Sometimes morning cough.  Nurse neighbor still gives allergy vaccine every 2 weeks. Discussed Epipen. Feels a little bump in right external ear canal- worried because friend had a cancer there.  05/09/2011 Acute walk in  Pt complains of rash on back for 1 week.  Was seen at Urgent care this am for rash - Pimple like rash across back and sides and on feet - Itching - Pt reports pulling a tick off under right arm this am. Unclear is this was a tick or bug because it crawled away. Urgent care dx w/ allergic dermatitis and rx Clarirtin.[name] Lesions are very pruritics. Has been  scratching and try to squeeze them. Believes she did remove 1 tick and but friend saw very small insect vs ticks crawling. She has greater than 25 bumps on back.  Works outside a lot.   06/21/11-78 year old female never smoker followed for Asthmatic bronchitis, rhinitis, complicated by CAD/ MI/ AF CHF, DM, GERD She continues allergy vaccine given by a neighbor/nurse without problems. She gets her shots every 2-3 weeks. He is using a dehumidifier because of mold in her house. So far the fall season is comfortable for her with occasional sneeze. She remains active and talks about picking up 20 bushels of walnuts from her property. Chest x-ray 12/15/2010 showed changes of COPD with no acute process. I talked with her about stopping allergy vaccine after all this time and at her age. She feels comfortable and safe with them and is considering my suggestion.  06/26/12- 78 year old female never smoker followed for Asthmatic bronchitis, rhinitis, complicated by CAD/ MI/ AF CHF, DM, GERD Stopped vaccine; states having some cough-clear in color-mainly happens in morning time. Can't tell me for how long this has been happening. Not concerned. Occ sneeze and blowing nose.  Asks refill red cough syrup "no codeine". Says over-the-counter cough syrups were no help. We discussed cough versus wheeze. She has been off allergy vaccine for the past year.   Review of Systems- see HPI Constitutional:   No-   weight loss, night sweats, fevers, chills, fatigue, lassitude. HEENT:   No-  headaches, difficulty swallowing, tooth/dental problems,  sore throat,       + sneezing, no- itching, ear ache, nasal congestion, post nasal drip,  CV:  No-   chest pain, orthopnea, PND, swelling in lower extremities, anasarca, dizziness, palpitations Resp: No-   shortness of breath with exertion or at rest.              No-  productive cough,  + non-productive cough,  No-  coughing up of blood.              No-   change in color of  mucus.  No- wheezing.   Skin: No-   rash or lesions. GI:  No-   heartburn, indigestion, abdominal pain, nausea, vomiting,  GU:  MS:  No-   joint pain or swelling.  . Neuro-   Psych:  No- change in mood or affect. No depression or anxiety.  No memory loss.  Objective:   Physical Exam General- Alert, Oriented, Affect-appropriate, Distress- none acute, slender, very alert. Skin- rash-none, lesions- none, excoriation- none.  Lymphadenopathy- none Head- atraumatic            Eyes- Gross vision intact, PERRLA, conjunctivae clear secretions            Ears- Hearing, canals-normal for age            Nose- Clear, no-Septal dev, mucus, polyps, erosion, perforation             Throat- Mallampati II , mucosa clear , drainage- none, tonsils- atrophic Neck- flexible , trachea midline, no stridor , thyroid nl, carotid no bruit Chest - symmetrical excursion , unlabored           Heart/CV- RRR , no murmur , no gallop  , no rub, nl s1 s2                           - JVD- none , edema- none, stasis changes- none, varices- none           Lung- clear, unlabored, wheeze- none, cough+ minor throat clearing , dullness-none, rub- none           Chest wall-  Abd-  Br/ Gen/ Rectal- Not done, not indicated Extrem- cyanosis- none, clubbing, none, atrophy- none, strength- nl Neuro- grossly intact to observation

## 2012-06-26 NOTE — Patient Instructions (Signed)
Sample Dymista nasalspray-   Try 1-2 puffs each nostril once every day at bedtime  Script for tessalon perles for cough if needed  Flu vax  Pneumovax

## 2012-07-05 NOTE — Assessment & Plan Note (Signed)
We discussed symptomatic control of postnasal drip and we'll try Dymista nasal spray.

## 2012-07-05 NOTE — Assessment & Plan Note (Signed)
3 minor cough been described now is not wheezing and [place] be more related to postnasal drip. Plan-Tessalon Perles, flu vaccine, pneumonia vaccine

## 2012-07-27 ENCOUNTER — Other Ambulatory Visit: Payer: Self-pay | Admitting: *Deleted

## 2012-07-27 DIAGNOSIS — E039 Hypothyroidism, unspecified: Secondary | ICD-10-CM

## 2012-07-27 MED ORDER — LEVOTHYROXINE SODIUM 125 MCG PO TABS: 125.0000 ug | ORAL_TABLET | Freq: Every day | ORAL | Status: DC

## 2012-07-29 ENCOUNTER — Other Ambulatory Visit: Payer: Self-pay | Admitting: *Deleted

## 2012-07-29 DIAGNOSIS — E039 Hypothyroidism, unspecified: Secondary | ICD-10-CM

## 2012-07-29 MED ORDER — LEVOTHYROXINE SODIUM 125 MCG PO TABS: 125.0000 ug | ORAL_TABLET | Freq: Every day | ORAL | Status: DC

## 2012-07-29 NOTE — Telephone Encounter (Signed)
Fax Received. Refill Completed. Salbador Fiveash Chowoe (R.M.A)   

## 2012-08-10 ENCOUNTER — Telehealth: Payer: Self-pay | Admitting: Cardiology

## 2012-08-10 ENCOUNTER — Encounter: Payer: Self-pay | Admitting: Family Medicine

## 2012-08-10 ENCOUNTER — Ambulatory Visit (INDEPENDENT_AMBULATORY_CARE_PROVIDER_SITE_OTHER)
Admission: RE | Admit: 2012-08-10 | Discharge: 2012-08-10 | Disposition: A | Discharge status: Home / Self Care | Payer: Medicare Other | Source: Ambulatory Visit | Attending: Family Medicine | Admitting: Family Medicine

## 2012-08-10 ENCOUNTER — Ambulatory Visit (INDEPENDENT_AMBULATORY_CARE_PROVIDER_SITE_OTHER): Payer: Medicare Other | Admitting: Family Medicine

## 2012-08-10 VITALS — BP 162/74 | HR 76 | Temp 97.4°F | Wt 127.0 lb

## 2012-08-10 DIAGNOSIS — M1710 Unilateral primary osteoarthritis, unspecified knee: Secondary | ICD-10-CM | POA: Diagnosis not present

## 2012-08-10 DIAGNOSIS — M79659 Pain in unspecified thigh: Secondary | ICD-10-CM

## 2012-08-10 DIAGNOSIS — M79606 Pain in leg, unspecified: Secondary | ICD-10-CM

## 2012-08-10 DIAGNOSIS — M79609 Pain in unspecified limb: Secondary | ICD-10-CM

## 2012-08-10 NOTE — Patient Instructions (Addendum)
Take 1 tylenol up to 4 times a day and see if that helps with the pain.  This should gradually get better.  Take care.

## 2012-08-10 NOTE — Progress Notes (Signed)
L leg pain.  Woke up with pain 5 days ago.  No trauma.  Some help with tylenol.  Pain sitting up, laying down, walking.  Pain in L groin prev.  Pain down the anterior thigh.  No pain below the knee.  No rash.  No falls.  She can weight bear.  Throbbing pain.  No FCNAVD.  Sitting with leg elevated helps some, ie in recliner with a pillow under the leg.  She can't sleep due to the pain.  No vaginal pain.    Meds, vitals, and allergies reviewed.   ROS: See HPI.  Otherwise, noncontributory.  nad ncat rrr ctab abd soft, not ttp No inguinal hernia palpated. L anterior thigh ttp w/o rash or bruise.   SLR neg on L SI not tender on testing No pain with int/ext rotation of L hip Distally with normal DP pulse and able to bear weight

## 2012-08-10 NOTE — Telephone Encounter (Signed)
New Problem:    Patient called in wanting to be seen today because she had pain at her cath site since 08/06/12.  Please call back.

## 2012-08-10 NOTE — Telephone Encounter (Signed)
Surgery in 2001, no recent cath. Pain in groin area. Advised to contact PCP. Stated she would just discuss with  Dr. Patty Sermons at her visit tomorrow.

## 2012-08-11 ENCOUNTER — Telehealth: Payer: Self-pay

## 2012-08-11 ENCOUNTER — Other Ambulatory Visit (INDEPENDENT_AMBULATORY_CARE_PROVIDER_SITE_OTHER): Payer: Medicare Other

## 2012-08-11 ENCOUNTER — Encounter: Payer: Self-pay | Admitting: Cardiology

## 2012-08-11 ENCOUNTER — Ambulatory Visit (INDEPENDENT_AMBULATORY_CARE_PROVIDER_SITE_OTHER): Payer: Medicare Other | Admitting: Cardiology

## 2012-08-11 ENCOUNTER — Other Ambulatory Visit: Payer: Self-pay | Admitting: *Deleted

## 2012-08-11 VITALS — BP 144/56 | HR 79 | Ht 64.0 in | Wt 125.0 lb

## 2012-08-11 DIAGNOSIS — E7800 Pure hypercholesterolemia, unspecified: Secondary | ICD-10-CM

## 2012-08-11 DIAGNOSIS — I509 Heart failure, unspecified: Secondary | ICD-10-CM | POA: Diagnosis not present

## 2012-08-11 DIAGNOSIS — I4891 Unspecified atrial fibrillation: Secondary | ICD-10-CM | POA: Diagnosis not present

## 2012-08-11 DIAGNOSIS — M79606 Pain in leg, unspecified: Secondary | ICD-10-CM

## 2012-08-11 DIAGNOSIS — E785 Hyperlipidemia, unspecified: Secondary | ICD-10-CM

## 2012-08-11 DIAGNOSIS — M79609 Pain in unspecified limb: Secondary | ICD-10-CM

## 2012-08-11 DIAGNOSIS — I2510 Atherosclerotic heart disease of native coronary artery without angina pectoris: Secondary | ICD-10-CM

## 2012-08-11 DIAGNOSIS — I480 Paroxysmal atrial fibrillation: Secondary | ICD-10-CM

## 2012-08-11 DIAGNOSIS — I259 Chronic ischemic heart disease, unspecified: Secondary | ICD-10-CM | POA: Diagnosis not present

## 2012-08-11 LAB — HEPATIC FUNCTION PANEL
AST: 32 U/L (ref 0–37)
Alkaline Phosphatase: 71 U/L (ref 39–117)
Bilirubin, Direct: 0.1 mg/dL (ref 0.0–0.3)
Total Bilirubin: 1.2 mg/dL (ref 0.3–1.2)

## 2012-08-11 LAB — CBC WITH DIFFERENTIAL/PLATELET
Basophils Absolute: 0 10*3/uL (ref 0.0–0.1)
Eosinophils Absolute: 0.1 10*3/uL (ref 0.0–0.7)
Lymphocytes Relative: 25 % (ref 12.0–46.0)
MCHC: 33 g/dL (ref 30.0–36.0)
Monocytes Relative: 9.7 % (ref 3.0–12.0)
Neutrophils Relative %: 63.5 % (ref 43.0–77.0)
RDW: 13.4 % (ref 11.5–14.6)

## 2012-08-11 LAB — BASIC METABOLIC PANEL
BUN: 18 mg/dL (ref 6–23)
Chloride: 96 mEq/L (ref 96–112)
GFR: 65.64 mL/min (ref >60.00)
Glucose, Bld: 111 mg/dL — ABNORMAL HIGH (ref 70–99)
Potassium: 4 mEq/L (ref 3.5–5.1)
Sodium: 133 mEq/L — ABNORMAL LOW (ref 135–145)

## 2012-08-11 LAB — LIPID PANEL
Cholesterol: 188 mg/dL (ref 0–200)
HDL: 32.5 mg/dL — ABNORMAL LOW (ref >39.00)
Total CHOL/HDL Ratio: 6
VLDL: 75.2 mg/dL — ABNORMAL HIGH (ref 0.0–40.0)

## 2012-08-11 NOTE — Assessment & Plan Note (Signed)
The past several weeks she has been experiencing some discomfort in the left upper thigh and left groin area.  She saw her PCP who did multiple x-rays and showed no fracture and felt that it might be a muscle spasm.  The patient has been taking Tylenol twice a day with mild improvement.

## 2012-08-11 NOTE — Patient Instructions (Addendum)
Your physician recommends that you continue on your current medications as directed. Please refer to the Current Medication list given to you today.  Your physician wants you to follow-up in: 4 months with fasting labs (lp/bmet/hfp) You will receive a reminder letter in the mail two months in advance. If you don't receive a letter, please call our office to schedule the follow-up appointment.   Will obtain labs today and call you with the results (lp/bmet/hfp)  

## 2012-08-11 NOTE — Assessment & Plan Note (Signed)
She has a remote history of atrial fibrillation.  She is not on Coumadin because of a fall risk.  He has had no recent episodes of palpitations or tachycardia or known atrial fib

## 2012-08-11 NOTE — Assessment & Plan Note (Signed)
Appears to originate at the quad on L leg.  Quad ttp and with testing.  No bruise. She could have had a sig cramp at night.  Would use tylenol qid for now and f/u prn.  xrays neg for femur pathology, reviewed the films myself and d/w pt.

## 2012-08-11 NOTE — Telephone Encounter (Signed)
Dr Cherre Huger request Dr Patsy Lager to call him at [phone] re: Brandalynn Kalter.

## 2012-08-11 NOTE — Assessment & Plan Note (Signed)
The patient has not been expressing symptoms of congestive heart failure.

## 2012-08-11 NOTE — Progress Notes (Signed)
Quick Note:  Please report to patient. The recent labs are stable. Continue same medication and careful diet. The blood sugar is higher today at 111 and the triglycerides are high at 376. Needs to watch sweets and carbohydrates more carefully ______

## 2012-08-11 NOTE — Assessment & Plan Note (Signed)
She has not had to take any recent sublingual nitroglycerin.

## 2012-08-11 NOTE — Progress Notes (Signed)
Jill Howell Date of Birth:  01/19/1919 [facility] [address] [place], [place]  [address] [phone]         Fax   [phone]  History of Present Illness: This pleasant 78 year old woman is seen for a scheduled four-month followup office visit. She has a history of ischemic heart disease. She had coronary artery bypass graft surgery in 2000. She has a history of diastolic congestive heart failure and high cholesterol. She said GERD and is on Protonix. She has had a history of hypothyroidism.  She has a known left carotid bruit. She has had no recent TIA symptoms.  She states that she is a diabetic although her blood sugars here in the office have been in the range of 100-102.  She watches her diet.  She is on no diabetic medicine.   Current Outpatient Prescriptions  Medication Sig Dispense Refill  . Ascorbic Acid (VITAMIN C) 1000 MG tablet Take 1,000 mg by mouth daily.        [name] aspirin 81 MG EC tablet Take 81 mg by mouth daily.        [name] atenolol (TENORMIN) 25 MG tablet Take 25 mg by mouth daily.       . benzonatate (TESSALON) 200 MG capsule Take 1 capsule (200 mg total) by mouth 3 (three) times daily as needed for cough.  30 capsule  1  . digoxin (LANOXIN) 0.125 MG tablet TAKE 1 TABLET DAILY  90 tablet  3  . fexofenadine (ALLEGRA) 60 MG tablet Take 1 tablet (60 mg total) by mouth 2 (two) times daily as needed.  60 tablet  3  . furosemide (LASIX) 40 MG tablet Take 1 tablet (40 mg total) by mouth daily.  90 tablet  3  . levothyroxine (SYNTHROID) 125 MCG tablet Take 1 tablet (125 mcg total) by mouth daily.  90 tablet  3  . Multiple Vitamins-Minerals (PRESERVISION AREDS PO) Take 1 tablet by mouth daily.      [name] NITROSTAT 0.4 MG SL tablet PLACE 1 TABLET UNDER THE TONGUE EVERY 5 MINUTES UP TO 3 DOSES AS NEEDED FOR CHEST PAIN.  100 tablet  0  . pantoprazole (PROTONIX) 40 MG tablet TAKE 1 TABLET (40 MG TOTAL) BY MOUTH DAILY.  90 tablet  3  . pyridoxine (B-6) 200 MG tablet  Take 200 mg by mouth daily.        . simvastatin (ZOCOR) 40 MG tablet TAKE 1 TABLET (40 MG TOTAL) BY MOUTH EVERY EVENING.  90 tablet  3    Allergies  Allergen Reactions  . Penicillins     REACTION: ?    Patient Active Problem List  Diagnosis  . HYPOTHYROIDISM  . DIABETES MELLITUS, TYPE II  . HYPERLIPIDEMIA  . HEARING IMPAIRMENT  . CORONARY ARTERY DISEASE  . ATRIAL FIBRILLATION  . CONGESTIVE HEART FAILURE  . ALLERGIC RHINITIS  . ASTHMA  . GERD  . UTI (lower urinary tract infection)  . Dermatitis  . Left carotid bruit  . Back pain  . Leg pain  . PAF (paroxysmal atrial fibrillation)    History  Smoking status  . Never Smoker   Smokeless tobacco  . Not on file    History  Alcohol Use No    Family History  Problem Relation Age of Onset  . Heart attack Father     Review of Systems: Constitutional: no fever chills diaphoresis or fatigue or change in weight.  Head and neck: no hearing loss, no epistaxis, no  photophobia or visual disturbance. Respiratory: No cough, shortness of breath or wheezing. Cardiovascular: No chest pain peripheral edema, palpitations. Gastrointestinal: No abdominal distention, no abdominal pain, no change in bowel habits hematochezia or melena. Genitourinary: No dysuria, no frequency, no urgency, no nocturia. Musculoskeletal:No arthralgias, no back pain, no gait disturbance or myalgias. Neurological: No dizziness, no headaches, no numbness, no seizures, no syncope, no weakness, no tremors. Hematologic: No lymphadenopathy, no easy bruising. Psychiatric: No confusion, no hallucinations, no sleep disturbance.    Physical Exam: Filed Vitals:   08/11/12 0937  BP: 144/56  Pulse: 79   the general appearance reveals an elderly alert woman in no distress.The head and neck exam reveals pupils equal and reactive.  Extraocular movements are full.  There is no scleral icterus.  The mouth and pharynx are normal.  The neck is supple.  The carotids  reveal no bruits.  The jugular venous pressure is normal.  The  thyroid is not enlarged.  There is no lymphadenopathy.  The chest is clear to percussion and auscultation.  There are no rales or rhonchi.  Expansion of the chest is symmetrical.  The precordium is quiet.  The first heart sound is normal.  The second heart sound is physiologically split.  There is no murmur gallop rub or click.  There is no abnormal lift or heave.  The abdomen is soft and nontender.  The bowel sounds are normal.  The liver and spleen are not enlarged.  There are no abdominal masses.  There are no abdominal bruits.  Extremities reveal good pedal pulses.  Examination of the left upper thigh and groin area reveals good femoral pulse.  There is no bruit.  She has good popliteal and pedal pulses as well.  There is no phlebitis or edema.  There is no cyanosis or clubbing.  Strength is normal and symmetrical in all extremities.  There is no lateralizing weakness.  There are no sensory deficits.  The skin is warm and dry.  There is no rash.     Assessment / Plan: The patient is to continue same medication.  Blood work today pending.  Recheck in 4 months for followup office visit lipid panel hepatic function panel and basal metabolic panel.

## 2012-08-13 ENCOUNTER — Telehealth: Payer: Self-pay | Admitting: *Deleted

## 2012-08-13 ENCOUNTER — Encounter: Payer: Self-pay | Admitting: Family Medicine

## 2012-08-13 ENCOUNTER — Ambulatory Visit (INDEPENDENT_AMBULATORY_CARE_PROVIDER_SITE_OTHER): Payer: Medicare Other | Admitting: Family Medicine

## 2012-08-13 VITALS — BP 150/70 | HR 70 | Temp 97.8°F | Ht 64.0 in | Wt 125.0 lb

## 2012-08-13 DIAGNOSIS — M79609 Pain in unspecified limb: Secondary | ICD-10-CM

## 2012-08-13 DIAGNOSIS — M79606 Pain in leg, unspecified: Secondary | ICD-10-CM

## 2012-08-13 NOTE — Progress Notes (Signed)
[facility] at [facility] [address] [place] [address] Phone: [phone] Fax: [phone]  Date:  08/13/2012   Name:  Jill Howell   DOB:  1918-12-03   MRN:  [id number] Gender: female Age: 78 y.o.  PCP:  Crawford Givens, MD  Evaluating MD: Hannah Beat, MD   Chief Complaint: Leg Pain   History of Present Illness:  Jill Howell is a 78 y.o. pleasant patient who presents with the following:  78 year old seen 2 days ago by Dr. Para March here for evaluation of L leg pain, referred by Dr. Cherre Huger, DC, for me to examine.  Woke up 3 AM on thanksgiving morning. Started to have some pain on the left. Saw Dr. Patty Sermons in [place].  He was not concerning although this was from her heart or any peripheral vascular disease.  Now she is having pain somewhat in the anterior thigh and also some laterally. No pain with movement or manipulation at the hip and the true hip joint. She denies back pain. Minimal to no posterior buttocks pain. Other history as below, no history of trauma. Does feel better with keeping her leg elevated somewhat. He is mildly better compared to that was several days ago. It is causing her enough pain that is limiting her sleep somewhat.  L leg, thigh, troch bursitis Inj hip  08/10/2012 OV, Dr. Para March L leg pain.  Woke up with pain 5 days ago.  No trauma.  Some help with tylenol.  Pain sitting up, laying down, walking.  Pain in L groin prev.  Pain down the anterior thigh.  No pain below the knee.  No rash.  No falls.  She can weight bear.  Throbbing pain.  No FCNAVD.  Sitting with leg elevated helps some, ie in recliner with a pillow under the leg.  She can't sleep due to the pain.  No vaginal pain.     Patient Active Problem List  Diagnosis  . HYPOTHYROIDISM  . DIABETES MELLITUS, TYPE II  . HYPERLIPIDEMIA  . HEARING IMPAIRMENT  . CORONARY ARTERY DISEASE  . ATRIAL FIBRILLATION  . CONGESTIVE HEART FAILURE  . ALLERGIC RHINITIS  . ASTHMA  .  GERD  . UTI (lower urinary tract infection)  . Dermatitis  . Left carotid bruit  . Back pain  . Leg pain  . PAF (paroxysmal atrial fibrillation)    Past Medical History  Diagnosis Date  . Acute upper respiratory infections of unspecified site   . Unspecified hearing loss   . Unspecified hypothyroidism   . Other and unspecified hyperlipidemia   . Esophageal reflux   . Type II or unspecified type diabetes mellitus without mention of complication, not stated as uncontrolled   . Coronary atherosclerosis of unspecified type of vessel, native or graft     s/p CABG 2000  . MI (myocardial infarction) 2000; 2011  . Congestive heart failure, unspecified   . Atrial fibrillation   . Unspecified asthma   . Allergic rhinitis, cause unspecified   . Hypertension     Past Surgical History  Procedure Date  . Tonsillectomy   . Partial hysterectomy   . Dilation and curettage of uterus   . Bladder tack     x 2  . Rectocele repair     x 2  . Cholecystectomy 10/2001  . Rotator cuff repair   . Coronary artery bypass graft 2000    4 vessel  . Hemorrhoid surgery   . Cardiac catheterization  09/07/1999    EF 25%. THERE IS SEVERE MITRAL ANNULAR CALIFICATION, WITH  3 + INSUFFICIENCY  . US echocardiography 01/17/2005    EF 55-60%  . Cardiovascular stress test 06/30/2007    EF 63%, NO ISCHEMIA    History  Substance Use Topics  . Smoking status: Never Smoker   . Smokeless tobacco: Not on file  . Alcohol Use: No    Family History  Problem Relation Age of Onset  . Heart attack Father     Allergies  Allergen Reactions  . Penicillins     REACTION: ?    Medication list has been reviewed and updated.  Outpatient Prescriptions Prior to Visit  Medication Sig Dispense Refill  . Ascorbic Acid (VITAMIN C) 1000 MG tablet Take 1,000 mg by mouth daily.        [name] aspirin 81 MG EC tablet Take 81 mg by mouth daily.        [name] atenolol (TENORMIN) 25 MG tablet Take 25 mg by mouth daily.       .  benzonatate (TESSALON) 200 MG capsule Take 1 capsule (200 mg total) by mouth 3 (three) times daily as needed for cough.  30 capsule  1  . digoxin (LANOXIN) 0.125 MG tablet TAKE 1 TABLET DAILY  90 tablet  3  . fexofenadine (ALLEGRA) 60 MG tablet Take 1 tablet (60 mg total) by mouth 2 (two) times daily as needed.  60 tablet  3  . furosemide (LASIX) 40 MG tablet Take 1 tablet (40 mg total) by mouth daily.  90 tablet  3  . levothyroxine (SYNTHROID) 125 MCG tablet Take 1 tablet (125 mcg total) by mouth daily.  90 tablet  3  . Multiple Vitamins-Minerals (PRESERVISION AREDS PO) Take 1 tablet by mouth daily.      [name] NITROSTAT 0.4 MG SL tablet PLACE 1 TABLET UNDER THE TONGUE EVERY 5 MINUTES UP TO 3 DOSES AS NEEDED FOR CHEST PAIN.  100 tablet  0  . pantoprazole (PROTONIX) 40 MG tablet TAKE 1 TABLET (40 MG TOTAL) BY MOUTH DAILY.  90 tablet  3  . pyridoxine (B-6) 200 MG tablet Take 200 mg by mouth daily.        . simvastatin (ZOCOR) 40 MG tablet TAKE 1 TABLET (40 MG TOTAL) BY MOUTH EVERY EVENING.  90 tablet  3   Last reviewed on 08/13/2012  3:18 PM by Consuello Masse, CMA  Review of Systems:   GEN: No fevers, chills. Nontoxic. Primarily MSK c/o today. MSK: Detailed in the HPI GI: tolerating PO intake without difficulty Neuro: No numbness, parasthesias, or tingling associated. Otherwise the pertinent positives of the ROS are noted above.    Physical Examination: Filed Vitals:   08/13/12 1516  BP: 150/70  Pulse: 70  Temp: 97.8 F (36.6 C)  TempSrc: Oral  Height: 5\' 4"  (1.626 m)  Weight: 125 lb (56.7 kg)  SpO2: 98%    Body mass index is 21.46 kg/(m^2). Ideal Body Weight: Weight in (lb) to have BMI = 25: 145.3    GEN: WDWN, NAD, Non-toxic, Alert & Oriented x 3 HEENT: Atraumatic, Normocephalic.  Ears and Nose: No external deformity. EXTR: No clubbing/cyanosis/edema NEURO: Normal gait.  PSYCH: Normally interactive. Conversant. Not depressed or anxious appearing.  Calm demeanor.   HIP  EXAM: SIDE: L ROM: Abduction, Flexion, Internal and External range of motion: full ROM Pain with terminal IROM and EROM: no = aactually quite normal and full and markedly better than average for her age.  GTB: L TTP SLR: NEG Knees: No effusion FABER: NT REVERSE FABER: mild tenderness Piriformis: NT at direct palpation Str: flexion: 5/5 abduction: 4/5 adduction: 4+/5 Strength testing mildly tender     Assessment and Plan:  1. Leg pain    Her examination is very reassuring. Most consistent with muscular pathology. Strongly doubt intra-articular pathology. Also the doubt back as primary source.  Patient does have some lateral hip bursitis, which is likely secondary. I did initiate some basic hip rehabilitation, and will go ahead and check her trochanteric bursa today. I suspect that the Depo-Medrol will likely help any other acute muscular rehabilitation inflammation as well. Like to avoid oral NSAIDs in this 78 year old patient.  Trochanteric Bursitis Injection, LEFT Verbal consent obtained. Risks (including infection, potential atrophy), benefits, and alternatives reviewed. Greater trochanter sterilely prepped with Chloraprep. Ethyl Chloride used for anesthesia. 4 cc of Lidocaine 1% injected with 2 cc of 40 mg Depo-Medrol into trochanteric bursa at area of maximal tenderness at greater trochanter. Needle taken to bone to troch bursa, flows easily. Bursa massaged. No bleeding and no complications. Decreased pain after injection. Needle: 22 gauge spinal needle   Orders Today:  No orders of the defined types were placed in this encounter.    Updated Medication List: (Includes new medications, updates to list, dose adjustments) No orders of the defined types were placed in this encounter.    Medications Discontinued: There are no discontinued medications.   Hannah Beat, MD

## 2012-08-13 NOTE — Telephone Encounter (Signed)
Message copied by Burnell Blanks on Thu Aug 13, 2012  3:22 PM ------      Message from: Cassell Clement      Created: Tue Aug 11, 2012  5:10 PM       Please report to patient.  The recent labs are stable. Continue same medication and careful diet.  The blood sugar is higher today at 111 and the triglycerides are high at 376.  Needs to watch sweets and carbohydrates more carefully

## 2012-08-13 NOTE — Telephone Encounter (Signed)
No answer mailed copy and highlighted  Dr. Yevonne Pax comments

## 2012-08-13 NOTE — Patient Instructions (Addendum)
Hip Rehab:  Hip Flexion: Toe up to ceiling, laying on your back. Lift your whole leg, 3 sets. Work up to being able to do #30 with each set.  Hip elevations, Toe and leg turned out to side.  Lift whole leg, 3 sets. Work up to being able to do #30 with each set.  Hip Abductions: Lying on side, straight out to side. 3 sets, work up to being able to do #30 with each set.  At the beginning you Barron only be able to do a lot less, try to do #10.  

## 2012-08-17 ENCOUNTER — Ambulatory Visit: Payer: Medicare Other | Admitting: Family Medicine

## 2012-08-17 ENCOUNTER — Encounter: Payer: Self-pay | Admitting: Family Medicine

## 2012-08-17 ENCOUNTER — Ambulatory Visit (INDEPENDENT_AMBULATORY_CARE_PROVIDER_SITE_OTHER): Payer: Medicare Other | Admitting: Family Medicine

## 2012-08-17 VITALS — BP 164/68 | HR 77 | Temp 97.5°F

## 2012-08-17 DIAGNOSIS — M79609 Pain in unspecified limb: Secondary | ICD-10-CM

## 2012-08-17 DIAGNOSIS — M79606 Pain in leg, unspecified: Secondary | ICD-10-CM

## 2012-08-17 NOTE — Patient Instructions (Addendum)
Take tylenol for pain and see Shirlee Limerick about your referral before you leave today.

## 2012-08-17 NOTE — Progress Notes (Signed)
She saw chiropractor and Dr. Patsy Lager.  Still with L anterior/lateral thigh pain.  No posterior pain.  No rash.  Tylenol helps some.  Throbbing pain.  Prev notes reviewed.  Injection by Dr. Patsy Lager didn't help with thigh pain.  Able to 'get by' with tylenol for pain.   Meds, vitals, and allergies reviewed.   ROS: See HPI.  Otherwise, noncontributory.  nad but uncomfortable.   rrr ctab L ITB ttp but no rash.  Normal ROM at the hip.

## 2012-08-17 NOTE — Telephone Encounter (Signed)
Already seen in the office by myself, Dr. Para March, and Dr. Anner Crete - all notes reviewed, discussed with Dr. Para March today.

## 2012-08-18 ENCOUNTER — Encounter (INDEPENDENT_AMBULATORY_CARE_PROVIDER_SITE_OTHER): Payer: Medicare Other

## 2012-08-18 DIAGNOSIS — M79609 Pain in unspecified limb: Secondary | ICD-10-CM | POA: Diagnosis not present

## 2012-08-18 DIAGNOSIS — M79606 Pain in leg, unspecified: Secondary | ICD-10-CM

## 2012-08-18 NOTE — Assessment & Plan Note (Signed)
I doubt DVT but will check u/s to eval for this.  Continue tylenol prn and refer to PT for likely ITB pain. D/w pt.  She agrees.

## 2012-08-25 DIAGNOSIS — M25569 Pain in unspecified knee: Secondary | ICD-10-CM | POA: Diagnosis not present

## 2012-08-27 DIAGNOSIS — M25569 Pain in unspecified knee: Secondary | ICD-10-CM | POA: Diagnosis not present

## 2012-08-31 DIAGNOSIS — M25569 Pain in unspecified knee: Secondary | ICD-10-CM | POA: Diagnosis not present

## 2012-09-03 DIAGNOSIS — M25569 Pain in unspecified knee: Secondary | ICD-10-CM | POA: Diagnosis not present

## 2012-09-09 DIAGNOSIS — M25569 Pain in unspecified knee: Secondary | ICD-10-CM | POA: Diagnosis not present

## 2012-09-10 DIAGNOSIS — M25569 Pain in unspecified knee: Secondary | ICD-10-CM | POA: Diagnosis not present

## 2012-09-17 DIAGNOSIS — M25569 Pain in unspecified knee: Secondary | ICD-10-CM | POA: Diagnosis not present

## 2012-09-18 DIAGNOSIS — M25569 Pain in unspecified knee: Secondary | ICD-10-CM | POA: Diagnosis not present

## 2012-09-23 DIAGNOSIS — M25569 Pain in unspecified knee: Secondary | ICD-10-CM | POA: Diagnosis not present

## 2012-09-25 DIAGNOSIS — M25569 Pain in unspecified knee: Secondary | ICD-10-CM | POA: Diagnosis not present

## 2012-09-30 ENCOUNTER — Other Ambulatory Visit: Payer: Self-pay

## 2012-09-30 MED ORDER — SIMVASTATIN 40 MG PO TABS: 40.0000 mg | ORAL_TABLET | Freq: Every evening | ORAL | Status: DC

## 2012-10-21 ENCOUNTER — Other Ambulatory Visit: Payer: Self-pay | Admitting: *Deleted

## 2012-10-21 DIAGNOSIS — E039 Hypothyroidism, unspecified: Secondary | ICD-10-CM

## 2012-10-21 MED ORDER — LEVOTHYROXINE SODIUM 125 MCG PO TABS: 125.0000 ug | ORAL_TABLET | Freq: Every day | ORAL | Status: DC

## 2012-11-02 ENCOUNTER — Telehealth: Payer: Self-pay | Admitting: Internal Medicine

## 2012-11-02 MED ORDER — AZELASTINE-FLUTICASONE 137-50 MCG/ACT NA SUSP: 1.0000 | Freq: Every day | NASAL | Status: DC

## 2012-11-02 NOTE — Telephone Encounter (Signed)
Rx has been sent in. 

## 2012-12-09 ENCOUNTER — Encounter: Payer: Self-pay | Admitting: Cardiology

## 2012-12-09 ENCOUNTER — Ambulatory Visit (INDEPENDENT_AMBULATORY_CARE_PROVIDER_SITE_OTHER): Payer: Medicare Other | Admitting: Cardiology

## 2012-12-09 VITALS — BP 144/66 | HR 70 | Ht 64.0 in | Wt 128.0 lb

## 2012-12-09 DIAGNOSIS — I119 Hypertensive heart disease without heart failure: Secondary | ICD-10-CM

## 2012-12-09 DIAGNOSIS — E119 Type 2 diabetes mellitus without complications: Secondary | ICD-10-CM

## 2012-12-09 DIAGNOSIS — E7800 Pure hypercholesterolemia, unspecified: Secondary | ICD-10-CM | POA: Diagnosis not present

## 2012-12-09 DIAGNOSIS — I4891 Unspecified atrial fibrillation: Secondary | ICD-10-CM | POA: Diagnosis not present

## 2012-12-09 DIAGNOSIS — E785 Hyperlipidemia, unspecified: Secondary | ICD-10-CM

## 2012-12-09 DIAGNOSIS — I2510 Atherosclerotic heart disease of native coronary artery without angina pectoris: Secondary | ICD-10-CM

## 2012-12-09 DIAGNOSIS — I480 Paroxysmal atrial fibrillation: Secondary | ICD-10-CM

## 2012-12-09 LAB — BASIC METABOLIC PANEL
BUN: 26 mg/dL — ABNORMAL HIGH (ref 6–23)
CO2: 32 mEq/L (ref 19–32)
Chloride: 100 mEq/L (ref 96–112)
GFR: 56.41 mL/min — ABNORMAL LOW (ref >60.00)
Glucose, Bld: 107 mg/dL — ABNORMAL HIGH (ref 70–99)
Potassium: 4.4 mEq/L (ref 3.5–5.1)
Sodium: 137 mEq/L (ref 135–145)

## 2012-12-09 LAB — LIPID PANEL
HDL: 28.5 mg/dL — ABNORMAL LOW (ref >39.00)
Total CHOL/HDL Ratio: 5
Triglycerides: 272 mg/dL — ABNORMAL HIGH (ref 0.0–149.0)
VLDL: 54.4 mg/dL — ABNORMAL HIGH (ref 0.0–40.0)

## 2012-12-09 LAB — HEPATIC FUNCTION PANEL
Bilirubin, Direct: 0.1 mg/dL (ref 0.0–0.3)
Total Bilirubin: 1.1 mg/dL (ref 0.3–1.2)

## 2012-12-09 MED ORDER — DIGOXIN 125 MCG PO TABS: 0.1250 mg | ORAL_TABLET | Freq: Every day | ORAL | Status: DC

## 2012-12-09 MED ORDER — ATENOLOL 25 MG PO TABS: ORAL_TABLET | ORAL | Status: DC

## 2012-12-09 MED ORDER — NITROGLYCERIN 0.4 MG SL SUBL: 0.4000 mg | SUBLINGUAL_TABLET | SUBLINGUAL | Status: DC | PRN

## 2012-12-09 MED ORDER — FUROSEMIDE 40 MG PO TABS: 40.0000 mg | ORAL_TABLET | Freq: Every day | ORAL | Status: DC

## 2012-12-09 MED ORDER — SIMVASTATIN 40 MG PO TABS: 40.0000 mg | ORAL_TABLET | Freq: Every evening | ORAL | Status: DC

## 2012-12-09 NOTE — Assessment & Plan Note (Signed)
The patient has not had any recent chest pain or angina.

## 2012-12-09 NOTE — Progress Notes (Signed)
Quick Note:  Please report to patient. The recent labs are stable. Continue same medication and careful diet. ______ 

## 2012-12-09 NOTE — Assessment & Plan Note (Signed)
Patient has a history of hyperlipidemia.  We are checking fasting lab work today.  She is on simvastatin.  She is not having any myalgias.

## 2012-12-09 NOTE — Progress Notes (Signed)
Jill Howell Date of Birth:  May 15, 1919 [facility] [address] [place], [place]  [address] [phone]         Fax   [phone]  History of Present Illness: This pleasant 79 year old woman is seen for a scheduled four-month followup office visit. She has a history of ischemic heart disease. She had coronary artery bypass graft surgery in 2000. She has a history of diastolic congestive heart failure and high cholesterol. She said GERD and is on Protonix. She has had a history of hypothyroidism. She has a known left carotid bruit. She has had no recent TIA symptoms. She states that she is a diabetic although her blood sugars here in the office have been in the range of 100-102. She watches her diet. She is on no diabetic medicine.  Since last visit she has been doing well.  She does complain of overall decreased energy but still stays active.   Current Outpatient Prescriptions  Medication Sig Dispense Refill  . Ascorbic Acid (VITAMIN C) 1000 MG tablet Take 1,000 mg by mouth daily.        [name] aspirin 81 MG EC tablet Take 81 mg by mouth daily.        [name] atenolol (TENORMIN) 25 MG tablet Take 1/2  Tab twice a day  90 tablet  3  . benzonatate (TESSALON) 200 MG capsule Take 1 capsule (200 mg total) by mouth 3 (three) times daily as needed for cough.  30 capsule  1  . digoxin (LANOXIN) 0.125 MG tablet Take 1 tablet (0.125 mg total) by mouth daily.  90 tablet  3  . furosemide (LASIX) 40 MG tablet Take 1 tablet (40 mg total) by mouth daily.  90 tablet  3  . levothyroxine (SYNTHROID) 125 MCG tablet Take 1 tablet (125 mcg total) by mouth daily.  90 tablet  3  . Multiple Vitamins-Minerals (CENTRUM SILVER PO) Take by mouth. 1 tab daily      . Multiple Vitamins-Minerals (PRESERVISION AREDS PO) Take 1 tablet by mouth daily.      . nitroGLYCERIN (NITROSTAT) 0.4 MG SL tablet Place 1 tablet (0.4 mg total) under the tongue every 5 (five) minutes as needed for chest pain.  100 tablet  prn    . OVER THE COUNTER MEDICATION All day allergy tab 1 tab daily      . pantoprazole (PROTONIX) 40 MG tablet TAKE 1 TABLET (40 MG TOTAL) BY MOUTH DAILY.  90 tablet  3  . pyridoxine (B-6) 200 MG tablet Take 200 mg by mouth daily.        . simvastatin (ZOCOR) 40 MG tablet Take 1 tablet (40 mg total) by mouth every evening.  90 tablet  3  . Azelastine-Fluticasone 137-50 MCG/ACT SUSP Place 1-2 puffs into the nose at bedtime.  23 g  2   No current facility-administered medications for this visit.    Allergies  Allergen Reactions  . Penicillins     REACTION: ?    Patient Active Problem List  Diagnosis  . HYPOTHYROIDISM  . DIABETES MELLITUS, TYPE II  . HYPERLIPIDEMIA  . HEARING IMPAIRMENT  . CORONARY ARTERY DISEASE  . CONGESTIVE HEART FAILURE  . ALLERGIC RHINITIS  . ASTHMA  . GERD  . UTI (lower urinary tract infection)  . Dermatitis  . Left carotid bruit  . Back pain  . Leg pain  . PAF (paroxysmal atrial fibrillation)    History  Smoking status  . Never Smoker   Smokeless tobacco  .  Not on file    History  Alcohol Use No    Family History  Problem Relation Age of Onset  . Heart attack Father     Review of Systems: Constitutional: no fever chills diaphoresis or fatigue or change in weight.  Head and neck: no hearing loss, no epistaxis, no photophobia or visual disturbance. Respiratory: No cough, shortness of breath or wheezing. Cardiovascular: No chest pain peripheral edema, palpitations. Gastrointestinal: No abdominal distention, no abdominal pain, no change in bowel habits hematochezia or melena. Genitourinary: No dysuria, no frequency, no urgency, no nocturia. Musculoskeletal:No arthralgias, no back pain, no gait disturbance or myalgias. Neurological: No dizziness, no headaches, no numbness, no seizures, no syncope, no weakness, no tremors. Hematologic: No lymphadenopathy, no easy bruising. Psychiatric: No confusion, no hallucinations, no sleep  disturbance.    Physical Exam: Filed Vitals:   12/09/12 1003  BP: 144/66  Pulse: 70   the general appearance reveals an elderly woman in no acute distress.The head and neck exam reveals pupils equal and reactive.  Extraocular movements are full.  There is no scleral icterus.  The mouth and pharynx are normal.  The neck is supple.  The carotids reveal no bruits.  The jugular venous pressure is normal.  The  thyroid is not enlarged.  There is no lymphadenopathy.  The chest is clear to percussion and auscultation.  There are no rales or rhonchi.  Expansion of the chest is symmetrical.  The precordium is quiet.  The first heart sound is normal.  The second heart sound is physiologically split.  There is no murmur gallop rub or click.  There is no abnormal lift or heave.  The abdomen is soft and nontender.  The bowel sounds are normal.  The liver and spleen are not enlarged.  There are no abdominal masses.  There are no abdominal bruits.  Extremities reveal good pedal pulses.  There is no phlebitis or edema.  There is no cyanosis or clubbing.  Strength is normal and symmetrical in all extremities.  There is no lateralizing weakness.  There are no sensory deficits.  The skin is warm and dry.  There is no rash.  She has a bruise on the top of her right foot from where she dropped a bowl on her foot.  There is no evidence of fracture or phlebitis initially by mouth without difficulty without any specific therapy.  Patient was reassured     Assessment / Plan:  Continue on same medication.  Recheck in 4 months for followup office visit EKG lipid panel hepatic function panel basal metabolic panel and A1c

## 2012-12-09 NOTE — Patient Instructions (Addendum)
Will obtain labs today and call you with the results (LP/BMET/HFP)  Your physician recommends that you continue on your current medications as directed. Please refer to the Current Medication list given to you today.  Your physician wants you to follow-up in: 4 months with fasting labs (lp/bmet/hfp/A1C) You will receive a reminder letter in the mail two months in advance. If you don't receive a letter, please call our office to schedule the follow-up appointment.

## 2012-12-09 NOTE — Assessment & Plan Note (Signed)
The patient has not been aware of any racing of her heart or paroxysmal atrial fibrillation

## 2012-12-14 ENCOUNTER — Telehealth: Payer: Self-pay | Admitting: *Deleted

## 2012-12-14 NOTE — Telephone Encounter (Signed)
Message copied by Burnell Blanks on Mon Dec 14, 2012  1:05 PM ------      Message from: Cassell Clement      Created: Wed Dec 09, 2012 10:01 PM       Please report to patient.  The recent labs are stable. Continue same medication and careful diet. ------

## 2012-12-14 NOTE — Telephone Encounter (Signed)
Advised patient of lab results  

## 2013-01-22 DIAGNOSIS — H353190 Nonexudative age-related macular degeneration, unspecified eye, stage unspecified: Secondary | ICD-10-CM | POA: Diagnosis not present

## 2013-02-10 ENCOUNTER — Other Ambulatory Visit: Payer: Self-pay | Admitting: *Deleted

## 2013-02-10 MED ORDER — PANTOPRAZOLE SODIUM 40 MG PO TBEC: 40.0000 mg | DELAYED_RELEASE_TABLET | Freq: Every day | ORAL | Status: DC

## 2013-04-13 ENCOUNTER — Ambulatory Visit (INDEPENDENT_AMBULATORY_CARE_PROVIDER_SITE_OTHER): Payer: Medicare Other | Admitting: Cardiology

## 2013-04-13 ENCOUNTER — Encounter: Payer: Self-pay | Admitting: Cardiology

## 2013-04-13 VITALS — BP 142/68 | HR 64 | Ht 63.0 in | Wt 124.1 lb

## 2013-04-13 DIAGNOSIS — I2510 Atherosclerotic heart disease of native coronary artery without angina pectoris: Secondary | ICD-10-CM | POA: Diagnosis not present

## 2013-04-13 DIAGNOSIS — R0989 Other specified symptoms and signs involving the circulatory and respiratory systems: Secondary | ICD-10-CM

## 2013-04-13 DIAGNOSIS — I259 Chronic ischemic heart disease, unspecified: Secondary | ICD-10-CM

## 2013-04-13 DIAGNOSIS — E7800 Pure hypercholesterolemia, unspecified: Secondary | ICD-10-CM

## 2013-04-13 DIAGNOSIS — I480 Paroxysmal atrial fibrillation: Secondary | ICD-10-CM

## 2013-04-13 DIAGNOSIS — I4891 Unspecified atrial fibrillation: Secondary | ICD-10-CM

## 2013-04-13 LAB — LIPID PANEL: HDL: 33.6 mg/dL — ABNORMAL LOW (ref >39.00)

## 2013-04-13 LAB — BASIC METABOLIC PANEL
Calcium: 9.4 mg/dL (ref 8.4–10.5)
Creatinine, Ser: 1 mg/dL (ref 0.4–1.2)
GFR: 55.71 mL/min — ABNORMAL LOW (ref >60.00)
Glucose, Bld: 100 mg/dL — ABNORMAL HIGH (ref 70–99)
Sodium: 137 mEq/L (ref 135–145)

## 2013-04-13 LAB — HEPATIC FUNCTION PANEL
ALT: 22 U/L (ref 0–35)
AST: 24 U/L (ref 0–37)
Albumin: 4.2 g/dL (ref 3.5–5.2)
Alkaline Phosphatase: 68 U/L (ref 39–117)
Total Protein: 6.9 g/dL (ref 6.0–8.3)

## 2013-04-13 MED ORDER — SIMVASTATIN 40 MG PO TABS: 40.0000 mg | ORAL_TABLET | Freq: Every evening | ORAL | Status: DC

## 2013-04-13 NOTE — Assessment & Plan Note (Signed)
The patient has occasional chest pain for which he takes sublingual nitroglycerin.  Since last visit she has taken a total of 3 sublingual nitroglycerin.

## 2013-04-13 NOTE — Patient Instructions (Addendum)
Your physician has requested that you have a carotid duplex TO BE SCHEDULED IN [place] . This test is an ultrasound of the carotid arteries in your neck. It looks at blood flow through these arteries that supply the brain with blood. Allow one hour for this exam. There are no restrictions or special instructions.    Your physician recommends that you schedule a follow-up appointment in: 4 MONTHS/ EKG WITH DR [facility]    Your physician recommends that you return for lab work in: TODAY A1C, BMET LIPID, LIVER   Your physician recommends that you continue on your current medications as directed. Please refer to the Current Medication list given to you today.

## 2013-04-13 NOTE — Progress Notes (Signed)
Jill Howell Date of Birth:  [birth year] [facility] [address] [place], [place]  [address] [phone]         Fax   [phone]  History of Present Illness: This pleasant 79 year old woman is seen for a scheduled four-month followup office visit. She has a history of ischemic heart disease. She had coronary artery bypass graft surgery in 2000. She has a history of diastolic congestive heart failure and high cholesterol. She has had GERD and is on Protonix. She has had a history of hypothyroidism. She has a known left carotid bruit. She has had no recent TIA symptoms. She states that she is a diabetic although her blood sugars here in the office have been in the range of 100-102. She watches her diet. She is on no diabetic medicine. Since last visit she has been doing well. She does complain of overall decreased energy but still stays active.  Yesterday she spent canning peaches all day and today she is more fatigue   Current Outpatient Prescriptions  Medication Sig Dispense Refill  . Ascorbic Acid (VITAMIN C) 1000 MG tablet Take 1,000 mg by mouth daily.        [name] aspirin 81 MG EC tablet Take 81 mg by mouth daily.        [name] atenolol (TENORMIN) 25 MG tablet Take 1/2  Tab twice a day  90 tablet  3  . Azelastine-Fluticasone 137-50 MCG/ACT SUSP Place 1-2 puffs into the nose at bedtime.  23 g  2  . benzonatate (TESSALON) 200 MG capsule Take 1 capsule (200 mg total) by mouth 3 (three) times daily as needed for cough.  30 capsule  1  . digoxin (LANOXIN) 0.125 MG tablet Take 1 tablet (0.125 mg total) by mouth daily.  90 tablet  3  . furosemide (LASIX) 40 MG tablet Take 1 tablet (40 mg total) by mouth daily.  90 tablet  3  . levothyroxine (SYNTHROID) 125 MCG tablet Take 1 tablet (125 mcg total) by mouth daily.  90 tablet  3  . Multiple Vitamins-Minerals (CENTRUM SILVER PO) Take by mouth. 1 tab daily      . Multiple Vitamins-Minerals (PRESERVISION AREDS PO) Take 1 tablet by  mouth daily.      . nitroGLYCERIN (NITROSTAT) 0.4 MG SL tablet Place 1 tablet (0.4 mg total) under the tongue every 5 (five) minutes as needed for chest pain.  100 tablet  prn  . OVER THE COUNTER MEDICATION All day allergy tab 1 tab daily      . pantoprazole (PROTONIX) 40 MG tablet Take 1 tablet (40 mg total) by mouth daily.  90 tablet  3  . pyridoxine (B-6) 200 MG tablet Take 200 mg by mouth daily.        . simvastatin (ZOCOR) 40 MG tablet Take 1 tablet (40 mg total) by mouth every evening.  90 tablet  3   No current facility-administered medications for this visit.    Allergies  Allergen Reactions  . Penicillins     REACTION: ?    Patient Active Problem List   Diagnosis Date Noted  . Leg pain 08/11/2012  . PAF (paroxysmal atrial fibrillation) 08/11/2012  . Back pain 05/07/2012  . Left carotid bruit 08/19/2011  . Dermatitis 05/09/2011  . UTI (lower urinary tract infection) 03/21/2011  . HYPOTHYROIDISM 09/15/2007  . DIABETES MELLITUS, TYPE II 09/15/2007  . HYPERLIPIDEMIA 09/15/2007  . HEARING IMPAIRMENT 09/15/2007  . CORONARY ARTERY DISEASE 09/15/2007  . CONGESTIVE HEART  FAILURE 09/15/2007  . ALLERGIC RHINITIS 09/15/2007  . ASTHMA 09/15/2007  . GERD 09/15/2007    History  Smoking status  . Never Smoker   Smokeless tobacco  . Not on file    History  Alcohol Use No    Family History  Problem Relation Age of Onset  . Heart attack Father     Review of Systems: Constitutional: no fever chills diaphoresis or fatigue or change in weight.  Head and neck: no hearing loss, no epistaxis, no photophobia or visual disturbance. Respiratory: No cough, shortness of breath or wheezing. Cardiovascular: No chest pain peripheral edema, palpitations. Gastrointestinal: No abdominal distention, no abdominal pain, no change in bowel habits hematochezia or melena. Genitourinary: No dysuria, no frequency, no urgency, no nocturia. Musculoskeletal:No arthralgias, no back pain, no gait  disturbance or myalgias. Neurological: No dizziness, no headaches, no numbness, no seizures, no syncope, no weakness, no tremors. Hematologic: No lymphadenopathy, no easy bruising. Psychiatric: No confusion, no hallucinations, no sleep disturbance.    Physical Exam: Filed Vitals:   04/13/13 0928  BP: 142/68  Pulse: 64   the general appearance reveals a thin elderly alert woman in no distress.The head and neck exam reveals pupils equal and reactive.  Extraocular movements are full.  There is no scleral icterus.  The mouth and pharynx are normal.  The neck is supple.  The carotids reveal no bruits.  The jugular venous pressure is normal.  The  thyroid is not enlarged.  There is no lymphadenopathy.  The chest is clear to percussion and auscultation.  There are no rales or rhonchi.  Expansion of the chest is symmetrical.  The precordium is quiet.  The first heart sound is normal.  The second heart sound is physiologically split.  There is no murmur gallop rub or click.  There is no abnormal lift or heave.  The abdomen is soft and nontender.  The bowel sounds are normal.  The liver and spleen are not enlarged.  There are no abdominal masses.  There are no abdominal bruits.  Extremities reveal good pedal pulses.  There is no phlebitis or edema.  There is no cyanosis or clubbing.  Strength is normal and symmetrical in all extremities.  There is no lateralizing weakness.  There are no sensory deficits.  The skin is warm and dry.  There is no rash.     Assessment / Plan: Continue same medication.  Arrange for carotid duplex at [facility] lab.  We're checking lab work today.  Recheck in 4 months for office visit EKG lipid panel hepatic function panel basal metabolic panel and hemoglobin Z6X.

## 2013-04-13 NOTE — Assessment & Plan Note (Signed)
The patient has had no recurrent atrial fibrillation.  No palpitations.

## 2013-04-13 NOTE — Assessment & Plan Note (Signed)
Her left carotid bruit is perhaps more prominent on exam today.  Her last duplex 09/06/11 showed a 60-79% left internal carotid artery stenosis.  There was a lesser degree of stenosis in the right carotid.  We will update her carotid duplex.  The patient denies any TIA symptoms or amaurosis fugax etc.

## 2013-04-14 NOTE — Progress Notes (Signed)
Quick Note:  Please report to patient. The recent labs are stable. Continue same medication and careful diet. Triglycerides are higher. Watch sweets. ______

## 2013-04-15 ENCOUNTER — Telehealth: Payer: Self-pay | Admitting: Nurse Practitioner

## 2013-04-15 NOTE — Telephone Encounter (Signed)
Called patient to report lab results.  Patient asked me to let Dr. Patty Sermons know that when he asked her if she had s/s of TIA at last ov that she forgot to report that within the last 3 weeks she had left hand numbness that radiated into her left wrist.  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  Patient asked me to make Dr. Patty Sermons aware.

## 2013-04-15 NOTE — Telephone Encounter (Signed)
Her carotid stenosis is on the left so any symptoms would be expected to be on the opposite (right ) side. Nonetheless we are awaiting the results of her carotid duplex with interest.  Be sure to continue daily baby ASA

## 2013-04-16 ENCOUNTER — Telehealth: Payer: Self-pay | Admitting: Nurse Practitioner

## 2013-04-16 NOTE — Telephone Encounter (Signed)
Called patient to advise her of Dr. Yevonne Pax response to her symptoms in left arm.  Patient verbalized understanding and will continue to take baby ASA daily.

## 2013-04-21 ENCOUNTER — Encounter (INDEPENDENT_AMBULATORY_CARE_PROVIDER_SITE_OTHER): Payer: Medicare Other

## 2013-04-21 DIAGNOSIS — E7800 Pure hypercholesterolemia, unspecified: Secondary | ICD-10-CM

## 2013-04-21 DIAGNOSIS — I6529 Occlusion and stenosis of unspecified carotid artery: Secondary | ICD-10-CM | POA: Diagnosis not present

## 2013-04-21 DIAGNOSIS — I259 Chronic ischemic heart disease, unspecified: Secondary | ICD-10-CM

## 2013-04-21 DIAGNOSIS — R0989 Other specified symptoms and signs involving the circulatory and respiratory systems: Secondary | ICD-10-CM

## 2013-04-26 ENCOUNTER — Telehealth: Payer: Self-pay | Admitting: *Deleted

## 2013-04-26 NOTE — Telephone Encounter (Signed)
Advised patient

## 2013-04-26 NOTE — Telephone Encounter (Signed)
Message copied by Burnell Blanks on Mon Apr 26, 2013  6:32 PM ------      Message from: Cassell Clement      Created: Mon Apr 26, 2013  8:56 AM       The carotid Doppler shows that the plaque on the left side is bulkier than last year.  Recommended that she get a CT angiogram of carotid arteries to evaluate further. ------

## 2013-04-28 ENCOUNTER — Telehealth: Payer: Self-pay | Admitting: Cardiology

## 2013-04-28 DIAGNOSIS — R9389 Abnormal findings on diagnostic imaging of other specified body structures: Secondary | ICD-10-CM

## 2013-04-28 NOTE — Telephone Encounter (Signed)
Message copied by Burnell Blanks on Wed Apr 28, 2013  4:39 PM ------      Message from: Cassell Clement      Created: Mon Apr 26, 2013  8:56 AM       The carotid Doppler shows that the plaque on the left side is bulkier than last year.  Recommended that she get a CT angiogram of carotid arteries to evaluate further. ------

## 2013-04-28 NOTE — Telephone Encounter (Signed)
Scheduled for Monday 8/25

## 2013-04-28 NOTE — Telephone Encounter (Signed)
Scheduled for 05/03/13 at 1:00

## 2013-04-28 NOTE — Telephone Encounter (Signed)
Pt was told she would get a call yesterday re carotid test , she thinks it suppose to be today, pls call

## 2013-04-28 NOTE — Telephone Encounter (Signed)
Orders in for CTA of neck and sent to scheduling

## 2013-04-28 NOTE — Telephone Encounter (Signed)
New Problem  Pt called because she states she was supposed to have a carotid scheduled// please assist.

## 2013-05-03 ENCOUNTER — Ambulatory Visit (INDEPENDENT_AMBULATORY_CARE_PROVIDER_SITE_OTHER)
Admission: RE | Admit: 2013-05-03 | Discharge: 2013-05-03 | Disposition: A | Discharge status: Home / Self Care | Payer: Medicare Other | Source: Ambulatory Visit | Attending: Cardiology | Admitting: Cardiology

## 2013-05-03 DIAGNOSIS — I6509 Occlusion and stenosis of unspecified vertebral artery: Secondary | ICD-10-CM | POA: Diagnosis not present

## 2013-05-03 DIAGNOSIS — R9389 Abnormal findings on diagnostic imaging of other specified body structures: Secondary | ICD-10-CM

## 2013-05-03 DIAGNOSIS — I658 Occlusion and stenosis of other precerebral arteries: Secondary | ICD-10-CM | POA: Diagnosis not present

## 2013-05-03 MED ORDER — IOHEXOL 350 MG/ML SOLN
80.0000 mL | Freq: Once | INTRAVENOUS | Status: AC | PRN
  Administered 2013-05-03: 80 mL via INTRAVENOUS

## 2013-05-11 ENCOUNTER — Encounter: Payer: Self-pay | Admitting: Family Medicine

## 2013-05-11 ENCOUNTER — Ambulatory Visit (INDEPENDENT_AMBULATORY_CARE_PROVIDER_SITE_OTHER): Payer: Medicare Other | Admitting: Family Medicine

## 2013-05-11 VITALS — BP 112/56 | HR 68 | Temp 97.9°F | Ht 63.0 in | Wt 122.2 lb

## 2013-05-11 DIAGNOSIS — M549 Dorsalgia, unspecified: Secondary | ICD-10-CM | POA: Diagnosis not present

## 2013-05-11 DIAGNOSIS — W19XXXA Unspecified fall, initial encounter: Secondary | ICD-10-CM | POA: Diagnosis not present

## 2013-05-11 DIAGNOSIS — Y92009 Unspecified place in unspecified non-institutional (private) residence as the place of occurrence of the external cause: Secondary | ICD-10-CM

## 2013-05-11 NOTE — Assessment & Plan Note (Addendum)
We don't need to image at this point.  She has no complaints about her head now.  Would use tylenol, doughnut pillow and ice for her coccyx.  She agrees.  Refer for PT for BLE strengthening and fall prevention.  I talked with her about the carotid study and will notify cards of such.  I would expect cards and/or [facility] to be in touch with patient.  I don't thinks this episode was TIA related.  She agrees.

## 2013-05-11 NOTE — Patient Instructions (Signed)
Take tylenol, use a doughnut pillow and ice the sore area.  Shirlee Limerick will call about your referral.

## 2013-05-11 NOTE — Progress Notes (Signed)
Feel yesterday.  Was sitting down low, on a box w/o any arm support.  Was shucking corn.  Tried to get up w/o arm support and fell.  Bumped her coccyx and head.  No LOC.  Able to get up and walk.  No other pains.  Used neosporin on scalp.  Pain with initial sitting/standing, but can still walk.   She has had carotid studies done.  Episodic L hand tingling (x3 in the last ~2 months), unclear if this could be TIA sx.  No such sx now, no similar sx in clinic today.  No focal weakness.  Meds, vitals, and allergies reviewed.   ROS: See HPI.  Otherwise, noncontributory.  nad ncat except for small superficial scrape on the scalp Neck supple rrr ctab abd soft Ext w/o edema Back w/o midline pain except mildly ttp at the coccyx.   No bruising on the back Needs to push off to stand from a chair.  CN 2-12 wnl B, S/S wnl x4 except for diffuse weakness in the large muscle groups of the BLE, ie on standing.  Can ambulate.

## 2013-05-12 ENCOUNTER — Telehealth: Payer: Self-pay | Admitting: *Deleted

## 2013-05-12 DIAGNOSIS — I6522 Occlusion and stenosis of left carotid artery: Secondary | ICD-10-CM

## 2013-05-12 NOTE — Telephone Encounter (Signed)
Message copied by Burnell Blanks on Wed May 12, 2013 10:32 AM ------      Message from: Cassell Clement      Created: Mon May 03, 2013  8:29 PM       Please report.  The angio shows severe 90% stenosis. I want her to see Dr. Fabienne Bruns or associate at [facility] as soon as possible, to discuss possible surgery to prevent stroke. ------

## 2013-05-12 NOTE — Telephone Encounter (Signed)
Left message to call back  

## 2013-05-13 ENCOUNTER — Ambulatory Visit (INDEPENDENT_AMBULATORY_CARE_PROVIDER_SITE_OTHER): Payer: Medicare Other | Admitting: Vascular Surgery

## 2013-05-13 ENCOUNTER — Encounter: Payer: Self-pay | Admitting: Vascular Surgery

## 2013-05-13 DIAGNOSIS — I6529 Occlusion and stenosis of unspecified carotid artery: Secondary | ICD-10-CM | POA: Diagnosis not present

## 2013-05-13 NOTE — Progress Notes (Signed)
[facility] HISTORY AND PHYSICAL   History of Present Illness:  Patient is a 79 y.o. year old female who presents for evaluation of a symptomatic left internal carotid artery stenosis. The patient had an episode recently where she lost her balance and fell off a stool. She was found on duplex scan and CT Angio to have a high-grade left internal carotid artery stenosis. She denies prior history of TIA amaurosis or stroke. She lives alone and does all of her daily activities independently except for some housecleaning which is done by others. She still has some back soreness from a recent fall. This is slightly improved. She did not have a syncopal episode. Other medical problems include reflux, hyperlipidemia, diabetes, coronary disease.  Past Medical History  Diagnosis Date  . Acute upper respiratory infections of unspecified site   . Unspecified hearing loss   . Unspecified hypothyroidism   . Other and unspecified hyperlipidemia   . Esophageal reflux   . Type II or unspecified type diabetes mellitus without mention of complication, not stated as uncontrolled   . Coronary atherosclerosis of unspecified type of vessel, native or graft     s/p CABG 2000  . MI (myocardial infarction) 2000; 2011  . Congestive heart failure, unspecified   . Atrial fibrillation   . Unspecified asthma(493.90)   . Allergic rhinitis, cause unspecified   . Hypertension     Past Surgical History  Procedure Laterality Date  . Tonsillectomy    . Partial hysterectomy    . Dilation and curettage of uterus    . Bladder tack      x 2  . Rectocele repair      x 2  . Cholecystectomy  10/2001  . Rotator cuff repair    . Coronary artery bypass graft  2000    4 vessel  . Hemorrhoid surgery    . Cardiac catheterization  09/07/1999    EF 25%. THERE IS SEVERE MITRAL ANNULAR CALIFICATION, WITH  3 + INSUFFICIENCY  . US echocardiography  01/17/2005    EF 55-60%  . Cardiovascular stress test   06/30/2007    EF 63%, NO ISCHEMIA     Social History History  Substance Use Topics  . Smoking status: Never Smoker   . Smokeless tobacco: Never Used  . Alcohol Use: No    Family History Family History  Problem Relation Age of Onset  . Heart attack Father   . Heart disease Son     before age 22    Allergies  Allergies  Allergen Reactions  . Penicillins     REACTION: ?     Current Outpatient Prescriptions  Medication Sig Dispense Refill  . Ascorbic Acid (VITAMIN C) 1000 MG tablet Take 1,000 mg by mouth daily.        [name] aspirin 81 MG EC tablet Take 81 mg by mouth daily.        [name] atenolol (TENORMIN) 25 MG tablet Take 1/2  Tab twice a day  90 tablet  3  . furosemide (LASIX) 40 MG tablet Take 1 tablet (40 mg total) by mouth daily.  90 tablet  3  . levothyroxine (SYNTHROID) 125 MCG tablet Take 1 tablet (125 mcg total) by mouth daily.  90 tablet  3  . Multiple Vitamins-Minerals (CENTRUM SILVER PO) Take by mouth. 1 tab daily      . Multiple Vitamins-Minerals (PRESERVISION AREDS PO) Take 1 tablet by mouth daily.      . nitroGLYCERIN (  NITROSTAT) 0.4 MG SL tablet Place 1 tablet (0.4 mg total) under the tongue every 5 (five) minutes as needed for chest pain.  100 tablet  prn  . pantoprazole (PROTONIX) 40 MG tablet Take 1 tablet (40 mg total) by mouth daily.  90 tablet  3  . pyridoxine (B-6) 200 MG tablet Take 200 mg by mouth daily.        . simvastatin (ZOCOR) 40 MG tablet Take 1 tablet (40 mg total) by mouth every evening. THIS SCRIPT IS FILLED BY [address]  Page  90 tablet  3  . digoxin (LANOXIN) 0.125 MG tablet Take 1 tablet (0.125 mg total) by mouth daily.  90 tablet  3   No current facility-administered medications for this visit.    ROS:   General:  No weight loss, Fever, chills  HEENT: No recent headaches, no nasal bleeding, no visual changes, no sore throat  Neurologic: No dizziness, blackouts, seizures. No recent symptoms of stroke or mini-  stroke. No recent episodes of slurred speech, or temporary blindness.  Cardiac: No recent episodes of chest pain/pressure, no shortness of breath at rest.  + shortness of breath with exertion.    Vascular: No history of rest pain in feet.  No history of claudication.  No history of non-healing ulcer, No history of DVT   Pulmonary: No home oxygen, no productive cough, no hemoptysis,  No asthma or wheezing  Musculoskeletal:  [ ]  Arthritis, [x ] Low back pain,  [ ]  Joint pain  Hematologic:No history of hypercoagulable state.  No history of easy bleeding.  No history of anemia  Gastrointestinal: No hematochezia or melena,  No gastroesophageal reflux, no trouble swallowing  Urinary: [ ]  chronic Kidney disease, [ ]  on HD - [ ]  MWF or [ ]  TTHS, [ ]  Burning with urination, [ ]  Frequent urination, [ ]  Difficulty urinating;   Skin: No rashes  Psychological: No history of anxiety,  No history of depression   Physical Examination  Filed Vitals:   05/13/13 1109 05/13/13 1112  BP: 139/54 142/45  Pulse: 67   Height: 5\' 3"  (1.6 m)   Weight: 124 lb (56.246 kg)   SpO2: 98%     Body mass index is 21.97 kg/(m^2).  General:  Alert and oriented, no acute distress HEENT: Normal Neck: Left carotid bruit, no right bruit Pulmonary: Clear to auscultation bilaterally Cardiac: Regular Rate and Rhythm without murmur Abdomen: Soft, non-tender, non-distended, no mass Skin: No rash Extremity Pulses:  2+ radial, brachial, femoral bilaterally Musculoskeletal: No deformity or edema  Neurologic: Upper and lower extremity motor 5/5 and symmetric  DATA: I reviewed the patient's recent CT angiogram and carotid duplex scan. Duplex suggested 60-80% left internal carotid artery stenosis with calcification. CT angiogram shows severe circumferential calcification with probably 90% stenosis left side right side is 40-60% she has patent vertebral arteries with antegrade flow   ASSESSMENT: Asymptomatic high-grade  left internal carotid artery stenosis   PLAN:  Left carotid endarterectomy. We will schedule this a few weeks from now to allow her to continue to recover from her fall and have her back pain improved  Fabienne Bruns, MD [facility] [place] Office: [phone] Pager: [phone]  For VQI Use Only  PRE-ADM LIVING: Home  AMB STATUS: Ambulatory  CAD Sx: None  PRIOR CHF: Asymptomatic, History of CHF  STRESS TEST: [x ] No, [ ]  Normal, [ ]  + ischemia, [ ]  + MI, [ ]  Both

## 2013-05-14 ENCOUNTER — Other Ambulatory Visit: Payer: Self-pay | Admitting: *Deleted

## 2013-05-14 NOTE — Telephone Encounter (Signed)
Left message to call back  

## 2013-05-14 NOTE — Telephone Encounter (Signed)
Patient aware and has seen docotor

## 2013-05-14 NOTE — Telephone Encounter (Signed)
05-14-13 pt rtn call, is now home

## 2013-05-17 ENCOUNTER — Encounter: Payer: Self-pay | Admitting: Internal Medicine

## 2013-05-17 ENCOUNTER — Ambulatory Visit (INDEPENDENT_AMBULATORY_CARE_PROVIDER_SITE_OTHER): Payer: Medicare Other | Admitting: Internal Medicine

## 2013-05-17 ENCOUNTER — Telehealth: Payer: Self-pay

## 2013-05-17 ENCOUNTER — Telehealth: Payer: Self-pay | Admitting: Family Medicine

## 2013-05-17 VITALS — BP 130/60 | HR 62 | Wt 121.0 lb

## 2013-05-17 DIAGNOSIS — G459 Transient cerebral ischemic attack, unspecified: Secondary | ICD-10-CM | POA: Diagnosis not present

## 2013-05-17 NOTE — Assessment & Plan Note (Signed)
Has had 4 episodes of left forearm numbness that have last several minutes and seem to get better with rubbing. This would not localize to the left carotid which has high grade stenosis  This past time she had an apparent expressive aphasia---which would localize to left cerebral region (since she is right handed)  I don't have explanation for the forearm numbness but the aphasia is concerning 40% blockage in right carotid doesn't suggest this is the cause (or could be from left spinal cord lesion which is unlikely with the absence of any other neuro abnormalities)  I would recommend proceeding with planned left CEA without further testing.

## 2013-05-17 NOTE — Telephone Encounter (Signed)
I tried the only available phone number several times before going to lunch at 12:20 pm and again after returning at 1 pm only to get a busy signal.  Geraldine Contras, who covered for me when I went to lunch also tried to phone several times, getting the same busy signal.  I left a message at the son's home and work # which is the patient's emergency numbers.

## 2013-05-17 NOTE — Telephone Encounter (Signed)
Pt left v/m requesting call back (that was entire message) while I was at lunch. Called pt x 2 and got busy signal.

## 2013-05-17 NOTE — Telephone Encounter (Signed)
I talked with Dr. Evelina Dun office and Dr. Patty Sermons.  Please see about getting patient on the schedule for today.  Thanks.

## 2013-05-17 NOTE — Telephone Encounter (Signed)
Phone call rec'd from Dr. Darrick Penna.  Recommended to have pt. see PCP to further evaluate for stroke.  Noted that pt's high grade stenosis is"left" internal carotid, and her symtoms reported were on the same side.  Per Dr. Darrick Penna follow up to check if pt. able to get appt. with her PCP.  Called pt. Again.  Stated she called Dr. Para March, and was told he will call and speak to Dr. Darrick Penna.  Phone call to Dr. Para March.   Stated he discussed case with Dr. Patty Sermons, and will plan to schedule pt. for evaluation at [facility] today, to determine next step, and determine the timing of her surgery for (L) CEA.

## 2013-05-17 NOTE — Progress Notes (Signed)
Subjective:    Patient ID: Jill Howell, female    DOB: [birth year], 79 y.o.   MRN: [id number]  HPI Yesterday was walking around normally---- got numbness in left forearm Occurred again at [employer] Noticed some changes in voice--words were not intelligible Started staggering some  This has happened 4 times-- the forearm numbness on left Never had the speech problems  No clear weakness When she rubbed it for 10-20 minutes---it eventually did come back each time  Current Outpatient Prescriptions on File Prior to Visit  Medication Sig Dispense Refill  . Ascorbic Acid (VITAMIN C) 1000 MG tablet Take 1,000 mg by mouth daily.        [name] aspirin 81 MG EC tablet Take 81 mg by mouth daily.        . digoxin (LANOXIN) 0.125 MG tablet Take 1 tablet (0.125 mg total) by mouth daily.  90 tablet  3  . furosemide (LASIX) 40 MG tablet Take 1 tablet (40 mg total) by mouth daily.  90 tablet  3  . levothyroxine (SYNTHROID) 125 MCG tablet Take 1 tablet (125 mcg total) by mouth daily.  90 tablet  3  . Multiple Vitamins-Minerals (CENTRUM SILVER PO) Take 1 tablet by mouth daily.       . Multiple Vitamins-Minerals (PRESERVISION AREDS PO) Take 1 tablet by mouth daily.      . nitroGLYCERIN (NITROSTAT) 0.4 MG SL tablet Place 1 tablet (0.4 mg total) under the tongue every 5 (five) minutes as needed for chest pain.  100 tablet  prn  . pantoprazole (PROTONIX) 40 MG tablet Take 1 tablet (40 mg total) by mouth daily.  90 tablet  3  . pyridoxine (B-6) 200 MG tablet Take 200 mg by mouth daily.         No current facility-administered medications on file prior to visit.    Allergies  Allergen Reactions  . Penicillins     REACTION: ?    Past Medical History  Diagnosis Date  . Acute upper respiratory infections of unspecified site   . Unspecified hearing loss   . Unspecified hypothyroidism   . Other and unspecified hyperlipidemia   . Esophageal reflux   . Type II or unspecified type diabetes mellitus without mention of  complication, not stated as uncontrolled   . Coronary atherosclerosis of unspecified type of vessel, native or graft     s/p CABG 2000  . MI (myocardial infarction) 2000; 2011  . Congestive heart failure, unspecified   . Atrial fibrillation   . Unspecified asthma(493.90)   . Allergic rhinitis, cause unspecified   . Hypertension     Past Surgical History  Procedure Laterality Date  . Tonsillectomy    . Partial hysterectomy    . Dilation and curettage of uterus    . Bladder tack      x 2  . Rectocele repair      x 2  . Cholecystectomy  10/2001  . Rotator cuff repair    . Coronary artery bypass graft  2000    4 vessel  . Hemorrhoid surgery    . Cardiac catheterization  09/07/1999    EF 25%. THERE IS SEVERE MITRAL ANNULAR CALIFICATION, WITH  3 + INSUFFICIENCY  . US echocardiography  01/17/2005    EF 55-60%  . Cardiovascular stress test  06/30/2007    EF 63%, NO ISCHEMIA    Family History  Problem Relation Age of Onset  . Heart attack Father   . Heart disease Son  before age 67    History   Social History  . Marital Status: Widowed    Spouse Name: N/A    Number of Children: 4  . Years of Education: N/A   Occupational History  . Retired    Social History Main Topics  . Smoking status: Never Smoker   . Smokeless tobacco: Never Used  . Alcohol Use: No  . Drug Use: No  . Sexual Activity: Not on file   Other Topics Concern  . Not on file   Social History Narrative   Widowed      4 sons      Retired   Review of Systems Appetite is okay No chest pain No SOB No swallowing problems Coughs if she doesn't take her allergy medications    Objective:   Physical Exam  Constitutional: She appears well-developed and well-nourished. No distress.  HENT:  Mouth/Throat: Oropharynx is clear and moist. No oropharyngeal exudate.  Neck: Normal range of motion. Neck supple. No thyromegaly present.  Cardiovascular: Normal rate, regular rhythm and normal heart sounds.   Exam reveals no gallop.   No murmur heard. Pulmonary/Chest: Effort normal and breath sounds normal. No respiratory distress. She has no wheezes. She has no rales.  Musculoskeletal: She exhibits no edema.  Walks stiffly but not antalgic  Lymphadenopathy:    She has no cervical adenopathy.  Neurological: She is alert. She has normal strength. She displays no tremor. No cranial nerve deficit or sensory deficit. She exhibits normal muscle tone. She displays a negative Romberg sign. Coordination and gait normal.  Psychiatric: She has a normal mood and affect. Her behavior is normal.          Assessment & Plan:

## 2013-05-17 NOTE — Telephone Encounter (Signed)
Phone call from pt.  C/o episode of left hand to mid-forearm numbness, and slurred speech yesterday.  Pt. denied feeling weak in left leg, but stated family member noticed she "staggered" when she walked yesterday.    Denied vision loss; denied confusion.  States her speech is improved today, and numbness is gone.  Questioned if she took her Aspirin today; stated she did take her Aspirin 81 mg.  Advised pt. to call her PCP at this time to report symptoms.  Also will notify Dr. Darrick Penna of symptoms.  Page sent to Dr. Darrick Penna.

## 2013-05-17 NOTE — Telephone Encounter (Signed)
Patient called in to [facility] and was told that Dr. Alphonsus Sias will see her at 3 pm.  Patient stated that she had gotten a new phone and she was laying down and grandson came to say she needed to call in to our office.  Lyla Son gave her the appt information.

## 2013-05-17 NOTE — Telephone Encounter (Signed)
Per our conversation, pt called in and says she had tingling/numbness in her left arm down to her hand; slurred speech; and was staggering yesterday. She doesn't currently have any symptoms.  She is sch to have surgery on her carotid artery next week. She called Dr. Darrick Penna and his office told her to see her PCP.  I am in the process of getting Dr. Patty Sermons on the phone for you now. Thank you.

## 2013-05-18 ENCOUNTER — Ambulatory Visit: Payer: Medicare Other | Admitting: Internal Medicine

## 2013-05-18 ENCOUNTER — Encounter (HOSPITAL_COMMUNITY): Payer: Self-pay | Admitting: *Deleted

## 2013-05-18 ENCOUNTER — Encounter (HOSPITAL_COMMUNITY): Payer: Self-pay | Admitting: Pharmacy Technician

## 2013-05-18 MED ORDER — VANCOMYCIN HCL 10 G IV SOLR
1500.0000 mg | INTRAVENOUS | Status: AC
  Administered 2013-05-19: 1500 mg via INTRAVENOUS
  Filled 2013-05-18: qty 1500

## 2013-05-19 ENCOUNTER — Encounter (HOSPITAL_COMMUNITY): Payer: Self-pay | Admitting: Anesthesiology

## 2013-05-19 ENCOUNTER — Inpatient Hospital Stay (HOSPITAL_COMMUNITY): Payer: Medicare Other | Admitting: Anesthesiology

## 2013-05-19 ENCOUNTER — Inpatient Hospital Stay (HOSPITAL_COMMUNITY): Payer: Medicare Other

## 2013-05-19 ENCOUNTER — Inpatient Hospital Stay (HOSPITAL_COMMUNITY)
Admission: RE | Admit: 2013-05-19 | Discharge: 2013-05-21 | DRG: 039 | Disposition: A | Discharge status: Home / Self Care | Payer: Medicare Other | Source: Ambulatory Visit | Attending: Vascular Surgery | Admitting: Vascular Surgery

## 2013-05-19 ENCOUNTER — Encounter (HOSPITAL_COMMUNITY)
Admission: RE | Disposition: A | Discharge status: Home / Self Care | Payer: Self-pay | Source: Ambulatory Visit | Attending: Vascular Surgery

## 2013-05-19 ENCOUNTER — Telehealth: Payer: Self-pay | Admitting: Vascular Surgery

## 2013-05-19 DIAGNOSIS — I6322 Cerebral infarction due to unspecified occlusion or stenosis of basilar arteries: Secondary | ICD-10-CM

## 2013-05-19 DIAGNOSIS — IMO0002 Reserved for concepts with insufficient information to code with codable children: Secondary | ICD-10-CM | POA: Diagnosis not present

## 2013-05-19 DIAGNOSIS — I2510 Atherosclerotic heart disease of native coronary artery without angina pectoris: Secondary | ICD-10-CM | POA: Diagnosis present

## 2013-05-19 DIAGNOSIS — E039 Hypothyroidism, unspecified: Secondary | ICD-10-CM | POA: Diagnosis present

## 2013-05-19 DIAGNOSIS — Z79899 Other long term (current) drug therapy: Secondary | ICD-10-CM

## 2013-05-19 DIAGNOSIS — J45909 Unspecified asthma, uncomplicated: Secondary | ICD-10-CM | POA: Diagnosis present

## 2013-05-19 DIAGNOSIS — I4891 Unspecified atrial fibrillation: Secondary | ICD-10-CM | POA: Diagnosis not present

## 2013-05-19 DIAGNOSIS — R4789 Other speech disturbances: Secondary | ICD-10-CM | POA: Diagnosis present

## 2013-05-19 DIAGNOSIS — M549 Dorsalgia, unspecified: Secondary | ICD-10-CM | POA: Diagnosis present

## 2013-05-19 DIAGNOSIS — Z01818 Encounter for other preprocedural examination: Secondary | ICD-10-CM

## 2013-05-19 DIAGNOSIS — I509 Heart failure, unspecified: Secondary | ICD-10-CM | POA: Diagnosis not present

## 2013-05-19 DIAGNOSIS — J40 Bronchitis, not specified as acute or chronic: Secondary | ICD-10-CM | POA: Diagnosis not present

## 2013-05-19 DIAGNOSIS — Z7982 Long term (current) use of aspirin: Secondary | ICD-10-CM | POA: Diagnosis not present

## 2013-05-19 DIAGNOSIS — I739 Peripheral vascular disease, unspecified: Secondary | ICD-10-CM | POA: Diagnosis present

## 2013-05-19 DIAGNOSIS — E119 Type 2 diabetes mellitus without complications: Secondary | ICD-10-CM | POA: Diagnosis not present

## 2013-05-19 DIAGNOSIS — R209 Unspecified disturbances of skin sensation: Secondary | ICD-10-CM | POA: Diagnosis present

## 2013-05-19 DIAGNOSIS — M533 Sacrococcygeal disorders, not elsewhere classified: Secondary | ICD-10-CM | POA: Diagnosis not present

## 2013-05-19 DIAGNOSIS — Z951 Presence of aortocoronary bypass graft: Secondary | ICD-10-CM

## 2013-05-19 DIAGNOSIS — I6529 Occlusion and stenosis of unspecified carotid artery: Principal | ICD-10-CM | POA: Diagnosis present

## 2013-05-19 DIAGNOSIS — E785 Hyperlipidemia, unspecified: Secondary | ICD-10-CM | POA: Diagnosis present

## 2013-05-19 DIAGNOSIS — Z9181 History of falling: Secondary | ICD-10-CM

## 2013-05-19 DIAGNOSIS — I252 Old myocardial infarction: Secondary | ICD-10-CM | POA: Diagnosis not present

## 2013-05-19 DIAGNOSIS — K219 Gastro-esophageal reflux disease without esophagitis: Secondary | ICD-10-CM | POA: Diagnosis present

## 2013-05-19 DIAGNOSIS — H9190 Unspecified hearing loss, unspecified ear: Secondary | ICD-10-CM | POA: Diagnosis not present

## 2013-05-19 DIAGNOSIS — I10 Essential (primary) hypertension: Secondary | ICD-10-CM | POA: Diagnosis not present

## 2013-05-19 HISTORY — PX: ENDARTERECTOMY: SHX5162

## 2013-05-19 HISTORY — PX: CAROTID ENDARTERECTOMY: SUR193

## 2013-05-19 LAB — PROTIME-INR: Prothrombin Time: 13.4 seconds (ref 11.6–15.2)

## 2013-05-19 LAB — GLUCOSE, CAPILLARY: Glucose-Capillary: 102 mg/dL — ABNORMAL HIGH (ref 70–99)

## 2013-05-19 LAB — URINALYSIS, ROUTINE W REFLEX MICROSCOPIC
Ketones, ur: NEGATIVE mg/dL
Leukocytes, UA: NEGATIVE
Nitrite: NEGATIVE
Protein, ur: NEGATIVE mg/dL
Urobilinogen, UA: 0.2 mg/dL (ref 0.0–1.0)

## 2013-05-19 LAB — CBC
HCT: 36.5 % (ref 36.0–46.0)
Hemoglobin: 12.7 g/dL (ref 12.0–15.0)
MCH: 30 pg (ref 26.0–34.0)
MCHC: 34.8 g/dL (ref 30.0–36.0)
RBC: 4.24 MIL/uL (ref 3.87–5.11)

## 2013-05-19 LAB — COMPREHENSIVE METABOLIC PANEL
ALT: 19 U/L (ref 0–35)
Alkaline Phosphatase: 75 U/L (ref 39–117)
BUN: 16 mg/dL (ref 6–23)
CO2: 24 mEq/L (ref 19–32)
Calcium: 9.7 mg/dL (ref 8.4–10.5)
GFR calc Af Amer: 63 mL/min — ABNORMAL LOW (ref >90)
GFR calc non Af Amer: 55 mL/min — ABNORMAL LOW (ref >90)
Glucose, Bld: 105 mg/dL — ABNORMAL HIGH (ref 70–99)
Potassium: 5.2 mEq/L — ABNORMAL HIGH (ref 3.5–5.1)
Sodium: 127 mEq/L — ABNORMAL LOW (ref 135–145)
Total Protein: 7.5 g/dL (ref 6.0–8.3)

## 2013-05-19 LAB — SURGICAL PCR SCREEN: Staphylococcus aureus: NEGATIVE

## 2013-05-19 SURGERY — ENDARTERECTOMY, CAROTID
Anesthesia: General | Site: Neck | Laterality: Left | Wound class: Clean

## 2013-05-19 MED ORDER — PROMETHAZINE HCL 25 MG/ML IJ SOLN: 6.2500 mg | INTRAMUSCULAR | Status: DC | PRN

## 2013-05-19 MED ORDER — PHENOL 1.4 % MT LIQD: 1.0000 | OROMUCOSAL | Status: DC | PRN

## 2013-05-19 MED ORDER — GLYCOPYRROLATE 0.2 MG/ML IJ SOLN
INTRAMUSCULAR | Status: DC | PRN
  Administered 2013-05-19: 0.2 mg via INTRAVENOUS

## 2013-05-19 MED ORDER — ROCURONIUM BROMIDE 100 MG/10ML IV SOLN
INTRAVENOUS | Status: DC | PRN
  Administered 2013-05-19: 40 mg via INTRAVENOUS
  Administered 2013-05-19: 10 mg via INTRAVENOUS

## 2013-05-19 MED ORDER — PROPOFOL 10 MG/ML IV BOLUS
INTRAVENOUS | Status: DC | PRN
  Administered 2013-05-19: 30 mg via INTRAVENOUS
  Administered 2013-05-19: 120 mg via INTRAVENOUS

## 2013-05-19 MED ORDER — ONDANSETRON HCL 4 MG/2ML IJ SOLN: 4.0000 mg | Freq: Four times a day (QID) | INTRAMUSCULAR | Status: DC | PRN

## 2013-05-19 MED ORDER — FENTANYL CITRATE 0.05 MG/ML IJ SOLN: 25.0000 ug | INTRAMUSCULAR | Status: DC | PRN

## 2013-05-19 MED ORDER — THROMBIN 20000 UNITS EX SOLR
CUTANEOUS | Status: AC
  Filled 2013-05-19: qty 20000

## 2013-05-19 MED ORDER — HEPARIN SODIUM (PORCINE) 1000 UNIT/ML IJ SOLN
INTRAMUSCULAR | Status: DC | PRN
  Administered 2013-05-19: 7000 [IU] via INTRAVENOUS

## 2013-05-19 MED ORDER — FUROSEMIDE 40 MG PO TABS
40.0000 mg | ORAL_TABLET | Freq: Every day | ORAL | Status: DC
  Administered 2013-05-20 – 2013-05-21 (×2): 40 mg via ORAL
  Filled 2013-05-19 (×2): qty 1

## 2013-05-19 MED ORDER — FENTANYL CITRATE 0.05 MG/ML IJ SOLN
INTRAMUSCULAR | Status: AC
  Filled 2013-05-19: qty 2

## 2013-05-19 MED ORDER — DOCUSATE SODIUM 100 MG PO CAPS
100.0000 mg | ORAL_CAPSULE | Freq: Every day | ORAL | Status: DC
  Administered 2013-05-20 – 2013-05-21 (×2): 100 mg via ORAL
  Filled 2013-05-19 (×2): qty 1

## 2013-05-19 MED ORDER — ASPIRIN EC 81 MG PO TBEC
81.0000 mg | DELAYED_RELEASE_TABLET | Freq: Every day | ORAL | Status: DC
  Administered 2013-05-20 – 2013-05-21 (×2): 81 mg via ORAL
  Filled 2013-05-19 (×2): qty 1

## 2013-05-19 MED ORDER — OXYCODONE HCL 5 MG PO TABS: 5.0000 mg | ORAL_TABLET | ORAL | Status: DC | PRN

## 2013-05-19 MED ORDER — METOPROLOL TARTRATE 1 MG/ML IV SOLN: 2.0000 mg | INTRAVENOUS | Status: DC | PRN

## 2013-05-19 MED ORDER — PANTOPRAZOLE SODIUM 40 MG PO TBEC
40.0000 mg | DELAYED_RELEASE_TABLET | Freq: Every day | ORAL | Status: DC
  Administered 2013-05-20 – 2013-05-21 (×2): 40 mg via ORAL
  Filled 2013-05-19 (×3): qty 1

## 2013-05-19 MED ORDER — MUPIROCIN 2 % EX OINT
TOPICAL_OINTMENT | CUTANEOUS | Status: AC
  Administered 2013-05-19: 1 via NASAL
  Filled 2013-05-19: qty 22

## 2013-05-19 MED ORDER — DIGOXIN 125 MCG PO TABS
0.1250 mg | ORAL_TABLET | Freq: Every day | ORAL | Status: DC
Start: 2013-05-20 — End: 2013-05-21
  Administered 2013-05-20 – 2013-05-21 (×2): 0.125 mg via ORAL
  Filled 2013-05-19 (×2): qty 1

## 2013-05-19 MED ORDER — PHENYLEPHRINE HCL 10 MG/ML IJ SOLN
10.0000 mg | INTRAVENOUS | Status: DC | PRN
  Administered 2013-05-19: 10 ug/min via INTRAVENOUS

## 2013-05-19 MED ORDER — LEVOTHYROXINE SODIUM 125 MCG PO TABS
125.0000 ug | ORAL_TABLET | Freq: Every day | ORAL | Status: DC
  Administered 2013-05-20 – 2013-05-21 (×2): 125 ug via ORAL
  Filled 2013-05-19 (×3): qty 1

## 2013-05-19 MED ORDER — ACETAMINOPHEN 650 MG RE SUPP: 325.0000 mg | RECTAL | Status: DC | PRN

## 2013-05-19 MED ORDER — SODIUM CHLORIDE 0.9 % IR SOLN
Status: DC | PRN
  Administered 2013-05-19: 12:00:00

## 2013-05-19 MED ORDER — ASPIRIN 81 MG PO TBEC: 81.0000 mg | DELAYED_RELEASE_TABLET | Freq: Every day | ORAL | Status: DC

## 2013-05-19 MED ORDER — HYDROMORPHONE HCL PF 1 MG/ML IJ SOLN: 0.5000 mg | INTRAMUSCULAR | Status: DC | PRN

## 2013-05-19 MED ORDER — SODIUM CHLORIDE 0.9 % IV SOLN
INTRAVENOUS | Status: DC
  Administered 2013-05-19: 18:00:00 via INTRAVENOUS

## 2013-05-19 MED ORDER — HYDRALAZINE HCL 20 MG/ML IJ SOLN: 10.0000 mg | INTRAMUSCULAR | Status: DC | PRN

## 2013-05-19 MED ORDER — VANCOMYCIN HCL IN DEXTROSE 1-5 GM/200ML-% IV SOLN: 1000.0000 mg | INTRAVENOUS | Status: DC

## 2013-05-19 MED ORDER — LACTATED RINGERS IV SOLN
INTRAVENOUS | Status: DC
  Administered 2013-05-19: 09:00:00 via INTRAVENOUS

## 2013-05-19 MED ORDER — POTASSIUM CHLORIDE CRYS ER 20 MEQ PO TBCR: 20.0000 meq | EXTENDED_RELEASE_TABLET | Freq: Once | ORAL | Status: AC | PRN

## 2013-05-19 MED ORDER — SODIUM CHLORIDE 0.9 % IV SOLN: 500.0000 mL | Freq: Once | INTRAVENOUS | Status: AC | PRN

## 2013-05-19 MED ORDER — NEOSTIGMINE METHYLSULFATE 1 MG/ML IJ SOLN
INTRAMUSCULAR | Status: DC | PRN
  Administered 2013-05-19: 2 mg via INTRAVENOUS

## 2013-05-19 MED ORDER — ALUM & MAG HYDROXIDE-SIMETH 200-200-20 MG/5ML PO SUSP: 15.0000 mL | ORAL | Status: DC | PRN

## 2013-05-19 MED ORDER — LACTATED RINGERS IV SOLN
INTRAVENOUS | Status: DC | PRN
  Administered 2013-05-19: 12:00:00 via INTRAVENOUS

## 2013-05-19 MED ORDER — ATENOLOL 12.5 MG HALF TABLET
12.5000 mg | ORAL_TABLET | Freq: Every day | ORAL | Status: DC
  Administered 2013-05-20 – 2013-05-21 (×2): 12.5 mg via ORAL
  Filled 2013-05-19 (×2): qty 1

## 2013-05-19 MED ORDER — SODIUM CHLORIDE 0.9 % IV SOLN: INTRAVENOUS | Status: DC

## 2013-05-19 MED ORDER — SIMVASTATIN 40 MG PO TABS
40.0000 mg | ORAL_TABLET | Freq: Every evening | ORAL | Status: DC
  Administered 2013-05-19 – 2013-05-20 (×2): 40 mg via ORAL
  Filled 2013-05-19 (×4): qty 1

## 2013-05-19 MED ORDER — GUAIFENESIN-DM 100-10 MG/5ML PO SYRP
15.0000 mL | ORAL_SOLUTION | ORAL | Status: DC | PRN
  Administered 2013-05-21: 15 mL via ORAL
  Filled 2013-05-19: qty 15

## 2013-05-19 MED ORDER — ONDANSETRON HCL 4 MG/2ML IJ SOLN
INTRAMUSCULAR | Status: DC | PRN
  Administered 2013-05-19: 4 mg via INTRAVENOUS

## 2013-05-19 MED ORDER — PROTAMINE SULFATE 10 MG/ML IV SOLN
INTRAVENOUS | Status: DC | PRN
  Administered 2013-05-19: 50 mg via INTRAVENOUS
  Administered 2013-05-19: 20 mg via INTRAVENOUS

## 2013-05-19 MED ORDER — NITROGLYCERIN 0.4 MG SL SUBL: 0.4000 mg | SUBLINGUAL_TABLET | SUBLINGUAL | Status: DC | PRN

## 2013-05-19 MED ORDER — FENTANYL CITRATE 0.05 MG/ML IJ SOLN
INTRAMUSCULAR | Status: DC | PRN
  Administered 2013-05-19: 25 ug via INTRAVENOUS
  Administered 2013-05-19: 100 ug via INTRAVENOUS

## 2013-05-19 MED ORDER — LIDOCAINE HCL (CARDIAC) 20 MG/ML IV SOLN
INTRAVENOUS | Status: DC | PRN
  Administered 2013-05-19: 50 mg via INTRAVENOUS

## 2013-05-19 MED ORDER — LABETALOL HCL 5 MG/ML IV SOLN: 10.0000 mg | INTRAVENOUS | Status: DC | PRN

## 2013-05-19 MED ORDER — 0.9 % SODIUM CHLORIDE (POUR BTL) OPTIME
TOPICAL | Status: DC | PRN
  Administered 2013-05-19: 2000 mL

## 2013-05-19 MED ORDER — ACETAMINOPHEN 325 MG PO TABS
325.0000 mg | ORAL_TABLET | ORAL | Status: DC | PRN
  Administered 2013-05-20 (×2): 650 mg via ORAL
  Filled 2013-05-19 (×2): qty 2

## 2013-05-19 MED ORDER — LIDOCAINE HCL (PF) 1 % IJ SOLN
INTRAMUSCULAR | Status: AC
  Filled 2013-05-19: qty 30

## 2013-05-19 SURGICAL SUPPLY — 48 items
CANISTER SUCTION 2500CC (MISCELLANEOUS) ×2 IMPLANT
CANNULA VESSEL W/WING WO/VALVE (CANNULA) ×4 IMPLANT
CATH ROBINSON RED A/P 18FR (CATHETERS) ×2 IMPLANT
CLIP TI MEDIUM 24 (CLIP) ×2 IMPLANT
CLIP TI WIDE RED SMALL 24 (CLIP) ×2 IMPLANT
CLOTH BEACON ORANGE TIMEOUT ST (SAFETY) ×2 IMPLANT
COVER SURGICAL LIGHT HANDLE (MISCELLANEOUS) ×2 IMPLANT
CRADLE DONUT ADULT HEAD (MISCELLANEOUS) ×2 IMPLANT
DECANTER SPIKE VIAL GLASS SM (MISCELLANEOUS) IMPLANT
DERMABOND ADVANCED (GAUZE/BANDAGES/DRESSINGS) ×1
DERMABOND ADVANCED .7 DNX12 (GAUZE/BANDAGES/DRESSINGS) ×1 IMPLANT
DRAIN HEMOVAC 1/8 X 5 (WOUND CARE) IMPLANT
DRAPE WARM FLUID 44X44 (DRAPE) ×2 IMPLANT
ELECT REM PT RETURN 9FT ADLT (ELECTROSURGICAL) ×2
ELECTRODE REM PT RTRN 9FT ADLT (ELECTROSURGICAL) ×1 IMPLANT
EVACUATOR SILICONE 100CC (DRAIN) IMPLANT
GEL ULTRASOUND 20GR AQUASONIC (MISCELLANEOUS) IMPLANT
GLOVE BIO SURGEON STRL SZ7.5 (GLOVE) ×2 IMPLANT
GLOVE BIOGEL PI IND STRL 6.5 (GLOVE) ×3 IMPLANT
GLOVE BIOGEL PI IND STRL 7.0 (GLOVE) ×1 IMPLANT
GLOVE BIOGEL PI INDICATOR 6.5 (GLOVE) ×3
GLOVE BIOGEL PI INDICATOR 7.0 (GLOVE) ×1
GLOVE ECLIPSE 6.5 STRL STRAW (GLOVE) ×2 IMPLANT
GOWN PREVENTION PLUS XLARGE (GOWN DISPOSABLE) ×2 IMPLANT
GOWN STRL NON-REIN LRG LVL3 (GOWN DISPOSABLE) ×6 IMPLANT
KIT BASIN OR (CUSTOM PROCEDURE TRAY) ×2 IMPLANT
KIT ROOM TURNOVER OR (KITS) ×2 IMPLANT
LOOP VESSEL MINI RED (MISCELLANEOUS) IMPLANT
NEEDLE HYPO 25GX1X1/2 BEV (NEEDLE) IMPLANT
NS IRRIG 1000ML POUR BTL (IV SOLUTION) ×4 IMPLANT
PACK CAROTID (CUSTOM PROCEDURE TRAY) ×2 IMPLANT
PAD ARMBOARD 7.5X6 YLW CONV (MISCELLANEOUS) ×4 IMPLANT
PATCH HEMASHIELD 8X75 (Vascular Products) ×2 IMPLANT
SHUNT CAROTID BYPASS 10 (VASCULAR PRODUCTS) IMPLANT
SHUNT CAROTID BYPASS 12FRX15.5 (VASCULAR PRODUCTS) IMPLANT
SPONGE SURGIFOAM ABS GEL 100 (HEMOSTASIS) IMPLANT
SUT ETHILON 3 0 PS 1 (SUTURE) IMPLANT
SUT PROLENE 6 0 CC (SUTURE) ×2 IMPLANT
SUT PROLENE 7 0 BV 1 (SUTURE) ×2 IMPLANT
SUT SILK 3 0 TIES 17X18 (SUTURE)
SUT SILK 3-0 18XBRD TIE BLK (SUTURE) IMPLANT
SUT VIC AB 3-0 SH 27 (SUTURE) ×1
SUT VIC AB 3-0 SH 27X BRD (SUTURE) ×1 IMPLANT
SUT VICRYL 4-0 PS2 18IN ABS (SUTURE) ×2 IMPLANT
SYR CONTROL 10ML LL (SYRINGE) IMPLANT
TOWEL OR 17X24 6PK STRL BLUE (TOWEL DISPOSABLE) ×2 IMPLANT
TOWEL OR 17X26 10 PK STRL BLUE (TOWEL DISPOSABLE) ×2 IMPLANT
WATER STERILE IRR 1000ML POUR (IV SOLUTION) ×2 IMPLANT

## 2013-05-19 NOTE — Op Note (Signed)
Procedure: Left carotid endarterectomy  Preoperative diagnosis: High-grade symptomatic left internal carotid artery stenosis  Postoperative diagnosis: Same  Anesthesia General  Asst.: Della Goo, [facility]  Operative findings: #1 greater than 80% left internal carotid stenosis                                                             #2 Dacron patch           #3 10 Fr shunt  Indications: Pt with recent episode of slurred speech and known high grade carotid stenosis  Operative details: After obtaining informed consent, the patient was taken to the operating room. The patient was placed in supine position on the operating room table. After induction of general anesthesia, the patient's entire neck and chest was prepped and draped in the usual sterile fashion. An oblique incision was made on the left aspect of the patient's neck anterior to the border the left sternocleidomastoid muscle. The incision was carried into the subcutaneous tissues and through the platysma. The sternocleidomastoid muscle was identified and reflected laterally. The common carotid artery was then found at the base of the incision this was dissected free circumferentially. It was fairly soft on palpation.  The vagus nerve was identified and protected. Dissection was then carried up to the level carotid bifurcation.   The ansa cervicalis was divided at its insertion to the hypoglossal in order to provide exposure.  The hypoglossal nerve was freed up by ligating tethering branches of the sternocleidomastoid branches of the occipital artery.  The internal carotid artery was dissected free circumferentially at the level of the hypoglossal nerve and it was soft in character at this location and above any palpable disease. A vessel loop was placed around this. Next the external carotid and superior thyroid arteries were dissected free circumferentially and vessel loops were placed around these. There was also an ascending pharyngeal  branch on the posterior carotid and this was controlled with a silk tie.  The patient was given 7000 units of intravenous heparin.  After 2 minutes of circulation time and raising the mean arterial pressure to 90 mm mercury, the distal internal carotid artery was controlled with small bulldog clamp. The external carotid and superior thyroid arteries were controlled with vessel loops. The common carotid artery was controlled with a peripheral DeBakey clamp. A longitudinal opening was made in the common carotid artery just below the bifurcation. The arteriotomy was extended distally up into the internal carotid with Potts scissors. There was a large calcified plaque with greater than 80% stenosis in the internal carotid with some ulceration.  A 10 Fr shunt was brought onto the field and fashioned to fit the patient's artery.  This was threaded into the distal internal carotid artery and allowed to backbleed thoroughly.  There was good but not pulsatile backbleeding.  This was then threaded into the common carotid and secured with a Rummel tourniquet.   There was no air at this point and flow was restored to the brain. There was some backbleeding from the distal internal carotid artery which was controlled with a shunt clamp. Attention was then turned to the common carotid artery once again. A suitable endarterectomy plane was obtained and endarterectomy was begun in the common carotid artery and a good proximal endpoint was obtained. An  eversion endarterectomy was performed on the external carotid artery and a good endpoint was obtained. The plaque was then elevated in the internal carotid artery and a nice feathered distal endpoint was also obtained.  The plaque was passed off the table. All loose debris was then removed from the carotid bed and everything was thoroughly irrigated with heparinized saline. A Dacron patch was then brought on to the operative field and this was sewn on as a patch angioplasty using a  running 6-0 Prolene suture. Prior to completion of the anastomosis the internal carotid artery was thoroughly backbled. This was then controlled again with a fine bulldog clamp.  The common carotid was thoroughly flushed forward. The external carotid was also thoroughly backbled.  The remainder of the patch was completed and the anastomosis was secured. Flow was then restored first retrograde from the external carotid into the carotid bed then antegrade from the common carotid to the external carotid artery and after approximately 5 cardiac cycles to the internal carotid artery. Doppler was used to evaluate the external/internal and common carotid arteries and these all had good Doppler flow. Hemostasis was obtained with 2 additional repair sutures. The patient was also given 70 mg of Protamine.      The platysma muscle was reapproximated using a running 3-0 Vicryl suture. The skin was closed with 4 0 Vicryl subcuticular stitch.  The patient was awakened in the operating room and was moving upper and lower extremities symmetrically and following commands.  The patient was stable on arrival to the PACU.  Fabienne Bruns, MD [facility] Office: [phone] Pager: [phone]

## 2013-05-19 NOTE — Transfer of Care (Signed)
Immediate Anesthesia Transfer of Care Note  Patient: Jill Howell  Procedure(s) Performed: Procedure(s): ENDARTERECTOMY CAROTID-LEFT (Left)  Patient Location: PACU  Anesthesia Type:General  Level of Consciousness: awake, alert  and oriented  Airway & Oxygen Therapy: Patient Spontanous Breathing and Patient connected to nasal cannula oxygen  Post-op Assessment: Report given to PACU RN and Post -op Vital signs reviewed and stable  Post vital signs: Reviewed and stable  Complications: No apparent anesthesia complications

## 2013-05-19 NOTE — Progress Notes (Signed)
Pharmacy to Adjust Antibiotics for Renal Function  OBJECTIVE:  Wt: 54.9 kg, Ht: 5'2" >> IBW ~50 kg SCr 0.89, CrCl~25-30 ml/min  ASSESSMENT: 79 y.o. F who underwent L carotid endartectomy on 05/19/13 and is ordered to receive Vanc x 1 dose post-op. The patient received Vancomycin 1500 mg x 1 dose pre-op around 1200 today. Given the patient's weight/renal function -- this should be sufficient to cover them for ~24 hours post-op. No additional antibiotic doses are needed at this time.   PLAN:  1. No additional Vancomycin needed -- the patient is adequately covered for ~24 hours post-op. 2. Pharmacy will continue to monitor for any additional dose adjustments needed.   Georgina Pillion, PharmD, BCPS Clinical Pharmacist Pager: [phone] 05/19/2013 6:19 PM

## 2013-05-19 NOTE — Telephone Encounter (Addendum)
Message copied by Rosalyn Charters on Wed May 19, 2013  4:44 PM ------      Message from: Marlowe Shores      Created: Wed May 19, 2013  3:02 PM       2 week CEA F/U - Fields ------  left message notifying patient of post op appt. 06-03-12 1:notified patient of change in fu appt.  to 07-01-13 at 1:45 pm

## 2013-05-19 NOTE — Anesthesia Postprocedure Evaluation (Signed)
  Anesthesia Post-op Note  Patient: Jill Howell  Procedure(s) Performed: Procedure(s): ENDARTERECTOMY CAROTID-LEFT (Left)  Patient Location: PACU  Anesthesia Type:General  Level of Consciousness: awake and alert   Airway and Oxygen Therapy: Patient Spontanous Breathing  Post-op Pain: mild  Post-op Assessment: Post-op Vital signs reviewed  Post-op Vital Signs: stable  Complications: No apparent anesthesia complications

## 2013-05-19 NOTE — Interval H&P Note (Signed)
History and Physical Interval Note:  05/19/2013 8:00 AM  Jill Howell  has presented today for surgery, with the diagnosis of Left Internal Carotid Artery Stenosis  The various methods of treatment have been discussed with the patient and family. After consideration of risks, benefits and other options for treatment, the patient has consented to  Procedure(s): ENDARTERECTOMY CAROTID-LEFT (Left) as a surgical intervention .  The patient's history has been reviewed, patient examined, no change in status, stable for surgery.  I have reviewed the patient's chart and labs.  Questions were answered to the patient's satisfaction.  Recent event with speech change suggesting this is now a symptomatic carotid stenosis with TIA earlier this week.  Risk of stroke increased with this recent event increasing need for CEA.   Atonya Templer E

## 2013-05-19 NOTE — Preoperative (Signed)
Beta Blockers   Reason not to administer Beta Blockers:Not Applicable 

## 2013-05-19 NOTE — Progress Notes (Signed)
Pt in PACU waking up.  Moves upper and lower extremities 5/5 motor.  Some right facial asymmetry when grimaces but pt not yet awake to know if this is different from baseline.  She does not really complain of this so most likely chronic although not noted preop will continue to follow. Tongue midline no slurred speech.  Fabienne Bruns, MD [facility] Office: [phone] Pager: [phone]

## 2013-05-19 NOTE — Anesthesia Preprocedure Evaluation (Signed)
Anesthesia Evaluation  Patient identified by MRN, date of birth, ID band Patient awake    Airway Mallampati: I  Neck ROM: Full    Dental  (+) Teeth Intact   Pulmonary asthma ,  breath sounds clear to auscultation        Cardiovascular hypertension, + CAD, + Past MI, + Peripheral Vascular Disease and +CHF + dysrhythmias Rhythm:Regular Rate:Normal     Neuro/Psych TIA   GI/Hepatic GERD-  ,  Endo/Other  diabetesHypothyroidism   Renal/GU      Musculoskeletal   Abdominal   Peds  Hematology   Anesthesia Other Findings   Reproductive/Obstetrics                           Anesthesia Physical Anesthesia Plan  ASA: IV  Anesthesia Plan: General   Post-op Pain Management:    Induction: Intravenous  Airway Management Planned: Oral ETT  Additional Equipment: Arterial line  Intra-op Plan:   Post-operative Plan: Extubation in OR  Informed Consent: I have reviewed the patients History and Physical, chart, labs and discussed the procedure including the risks, benefits and alternatives for the proposed anesthesia with the patient or authorized representative who has indicated his/her understanding and acceptance.   Dental advisory given  Plan Discussed with: CRNA and Surgeon  Anesthesia Plan Comments:         Anesthesia Quick Evaluation

## 2013-05-19 NOTE — H&P (View-Only) (Signed)
[facility] [place] HISTORY AND PHYSICAL   History of Present Illness:  Patient is a 79 y.o. year old female who presents for evaluation of a symptomatic left internal carotid artery stenosis. The patient had an episode recently where she lost her balance and fell off a stool. She was found on duplex scan and CT Angio to have a high-grade left internal carotid artery stenosis. She denies prior history of TIA amaurosis or stroke. She lives alone and does all of her daily activities independently except for some housecleaning which is done by others. She still has some back soreness from a recent fall. This is slightly improved. She did not have a syncopal episode. Other medical problems include reflux, hyperlipidemia, diabetes, coronary disease.  Past Medical History  Diagnosis Date  . Acute upper respiratory infections of unspecified site   . Unspecified hearing loss   . Unspecified hypothyroidism   . Other and unspecified hyperlipidemia   . Esophageal reflux   . Type II or unspecified type diabetes mellitus without mention of complication, not stated as uncontrolled   . Coronary atherosclerosis of unspecified type of vessel, native or graft     s/p CABG 2000  . MI (myocardial infarction) 2000; 2011  . Congestive heart failure, unspecified   . Atrial fibrillation   . Unspecified asthma(493.90)   . Allergic rhinitis, cause unspecified   . Hypertension     Past Surgical History  Procedure Laterality Date  . Tonsillectomy    . Partial hysterectomy    . Dilation and curettage of uterus    . Bladder tack      x 2  . Rectocele repair      x 2  . Cholecystectomy  10/2001  . Rotator cuff repair    . Coronary artery bypass graft  2000    4 vessel  . Hemorrhoid surgery    . Cardiac catheterization  09/07/1999    EF 25%. THERE IS SEVERE MITRAL ANNULAR CALIFICATION, WITH  3 + INSUFFICIENCY  . Us echocardiography  01/17/2005    EF 55-60%  . Cardiovascular stress test   06/30/2007    EF 63%, NO ISCHEMIA     Social History History  Substance Use Topics  . Smoking status: Never Smoker   . Smokeless tobacco: Never Used  . Alcohol Use: No    Family History Family History  Problem Relation Age of Onset  . Heart attack Father   . Heart disease Son     before age 60    Allergies  Allergies  Allergen Reactions  . Penicillins     REACTION: ?     Current Outpatient Prescriptions  Medication Sig Dispense Refill  . Ascorbic Acid (VITAMIN C) 1000 MG tablet Take 1,000 mg by mouth daily.        . aspirin 81 MG EC tablet Take 81 mg by mouth daily.        . atenolol (TENORMIN) 25 MG tablet Take 1/2  Tab twice a day  90 tablet  3  . furosemide (LASIX) 40 MG tablet Take 1 tablet (40 mg total) by mouth daily.  90 tablet  3  . levothyroxine (SYNTHROID) 125 MCG tablet Take 1 tablet (125 mcg total) by mouth daily.  90 tablet  3  . Multiple Vitamins-Minerals (CENTRUM SILVER PO) Take by mouth. 1 tab daily      . Multiple Vitamins-Minerals (PRESERVISION AREDS PO) Take 1 tablet by mouth daily.      . nitroGLYCERIN (  NITROSTAT) 0.4 MG SL tablet Place 1 tablet (0.4 mg total) under the tongue every 5 (five) minutes as needed for chest pain.  100 tablet  prn  . pantoprazole (PROTONIX) 40 MG tablet Take 1 tablet (40 mg total) by mouth daily.  90 tablet  3  . pyridoxine (B-6) 200 MG tablet Take 200 mg by mouth daily.        . simvastatin (ZOCOR) 40 MG tablet Take 1 tablet (40 mg total) by mouth every evening. THIS SCRIPT IS FILLED BY [facility] [address] IN  [place]  90 tablet  3  . digoxin (LANOXIN) 0.125 MG tablet Take 1 tablet (0.125 mg total) by mouth daily.  90 tablet  3   No current facility-administered medications for this visit.    ROS:   General:  No weight loss, Fever, chills  HEENT: No recent headaches, no nasal bleeding, no visual changes, no sore throat  Neurologic: No dizziness, blackouts, seizures. No recent symptoms of stroke or mini-  stroke. No recent episodes of slurred speech, or temporary blindness.  Cardiac: No recent episodes of chest pain/pressure, no shortness of breath at rest.  + shortness of breath with exertion.    Vascular: No history of rest pain in feet.  No history of claudication.  No history of non-healing ulcer, No history of DVT   Pulmonary: No home oxygen, no productive cough, no hemoptysis,  No asthma or wheezing  Musculoskeletal:  [ ] Arthritis, [x ] Low back pain,  [ ] Joint pain  Hematologic:No history of hypercoagulable state.  No history of easy bleeding.  No history of anemia  Gastrointestinal: No hematochezia or melena,  No gastroesophageal reflux, no trouble swallowing  Urinary: [ ] chronic Kidney disease, [ ] on HD - [ ] MWF or [ ] TTHS, [ ] Burning with urination, [ ] Frequent urination, [ ] Difficulty urinating;   Skin: No rashes  Psychological: No history of anxiety,  No history of depression   Physical Examination  Filed Vitals:   05/13/13 1109 05/13/13 1112  BP: 139/54 142/45  Pulse: 67   Height: 5' 3" (1.6 m)   Weight: 124 lb (56.246 kg)   SpO2: 98%     Body mass index is 21.97 kg/(m^2).  General:  Alert and oriented, no acute distress HEENT: Normal Neck: Left carotid bruit, no right bruit Pulmonary: Clear to auscultation bilaterally Cardiac: Regular Rate and Rhythm without murmur Abdomen: Soft, non-tender, non-distended, no mass Skin: No rash Extremity Pulses:  2+ radial, brachial, femoral bilaterally Musculoskeletal: No deformity or edema  Neurologic: Upper and lower extremity motor 5/5 and symmetric  DATA: I reviewed the patient's recent CT angiogram and carotid duplex scan. Duplex suggested 60-80% left internal carotid artery stenosis with calcification. CT angiogram shows severe circumferential calcification with probably 90% stenosis left side right side is 40-60% she has patent vertebral arteries with antegrade flow   ASSESSMENT: Asymptomatic high-grade  left internal carotid artery stenosis   PLAN:  Left carotid endarterectomy. We will schedule this a few weeks from now to allow her to continue to recover from her fall and have her back pain improved  Itxel Wickard, MD [facility] [place] Office: [phone] Pager: [phone]  For VQI Use Only  PRE-ADM LIVING: Home  AMB STATUS: Ambulatory  CAD Sx: None  PRIOR CHF: Asymptomatic, History of CHF  STRESS TEST: [x ] No, [ ] Normal, [ ] + ischemia, [ ] + MI, [ ] Both    

## 2013-05-20 ENCOUNTER — Inpatient Hospital Stay (HOSPITAL_COMMUNITY): Payer: Medicare Other

## 2013-05-20 DIAGNOSIS — IMO0002 Reserved for concepts with insufficient information to code with codable children: Secondary | ICD-10-CM | POA: Diagnosis not present

## 2013-05-20 DIAGNOSIS — M533 Sacrococcygeal disorders, not elsewhere classified: Secondary | ICD-10-CM | POA: Diagnosis not present

## 2013-05-20 LAB — BASIC METABOLIC PANEL
BUN: 11 mg/dL (ref 6–23)
CO2: 23 mEq/L (ref 19–32)
Calcium: 8.2 mg/dL — ABNORMAL LOW (ref 8.4–10.5)
Creatinine, Ser: 0.83 mg/dL (ref 0.50–1.10)
Glucose, Bld: 90 mg/dL (ref 70–99)

## 2013-05-20 LAB — CBC
Hemoglobin: 9.6 g/dL — ABNORMAL LOW (ref 12.0–15.0)
MCH: 29.9 pg (ref 26.0–34.0)
MCV: 86.3 fL (ref 78.0–100.0)
RBC: 3.21 MIL/uL — ABNORMAL LOW (ref 3.87–5.11)

## 2013-05-20 NOTE — Evaluation (Signed)
Physical Therapy Evaluation Patient Details Name: Jill Howell MRN: [id number] DOB: 29-Apr-1919 Today's Date: 05/20/2013 Time: 4098-1191 PT Time Calculation (min): 25 min  PT Assessment / Plan / Recommendation History of Present Illness  left CEA  Clinical Impression  Pt pleasant 79yo who was still living alone and relates some family tension and inability to count on family to provide 24hr care. Pt with sore low back and sacrum from recent fall at home in her wood shed standing up from shucking corn. Pt states she has had other falls but not sure how long ago they were. Pt presents with below deficits and balance and safety not conducive to being alone. Pt states she would prefer ST-SNF rather than trying to depend on family for assist. Will follow acutely to improve balance, transfers and function to decrease burden of care.     PT Assessment  Patient needs continued PT services    Follow Up Recommendations  Supervision/Assistance - 24 hour;SNF    Does the patient have the potential to tolerate intense rehabilitation      Barriers to Discharge Decreased caregiver support      Equipment Recommendations  Rolling walker with 5" wheels    Recommendations for Other Services OT consult   Frequency Min 3X/week    Precautions / Restrictions Precautions Precautions: Fall   Pertinent Vitals/Pain Pt with sore low back and buttock did not rate VSS     Mobility  Bed Mobility Bed Mobility: Rolling Right;Right Sidelying to Sit Rolling Right: 5: Supervision Right Sidelying to Sit: 5: Supervision;HOB flat Details for Bed Mobility Assistance: cueing for sequence with CEA Transfers Transfers: Sit to Stand;Stand to Sit Sit to Stand: 4: Min assist;From bed Stand to Sit: 4: Min guard;To chair/3-in-1 Details for Transfer Assistance: cueing for hand placement and safety with posterior LOB on intial standing with 2 trials standing to complete standing and remain. cueing for hand placement and  safety with transfers Ambulation/Gait Ambulation/Gait Assistance: 4: Min assist Ambulation Distance (Feet): 300 Feet Assistive device: None Ambulation/Gait Assistance Details: pt with unsteady gait and denied use of rW since she doesn't use it at home but required physical guarding and assist with gait to maintain balance due to instability Gait Pattern: Step-through pattern;Decreased stride length Gait velocity: decreased Stairs: Yes Stairs Assistance: 4: Min guard Stair Management Technique: One rail Right Number of Stairs: 3    Exercises     PT Diagnosis: Difficulty walking;Acute pain  PT Problem List: Decreased activity tolerance;Decreased balance;Decreased mobility;Pain;Decreased knowledge of use of DME PT Treatment Interventions: Gait training;DME instruction;Functional mobility training;Therapeutic exercise;Therapeutic activities;Patient/family education     PT Goals(Current goals can be found in the care plan section) Acute Rehab PT Goals Patient Stated Goal: be able to return home without dealing with family PT Goal Formulation: With patient Time For Goal Achievement: 06/03/13 Potential to Achieve Goals: Good  Visit Information  Last PT Received On: 05/20/13 Assistance Needed: +1 History of Present Illness: left CEA       Prior Functioning  Home Living Family/patient expects to be discharged to:: Private residence Living Arrangements: Alone Available Help at Discharge: Family;Available PRN/intermittently Type of Home: House Home Access: Stairs to enter [employer] of Steps: 3 Entrance Stairs-Rails: Right Home Layout: Two level;Able to live on main level with bedroom/bathroom Home Equipment: None Prior Function Level of Independence: Independent Communication Communication: No difficulties    Cognition  Cognition Arousal/Alertness: Awake/alert Behavior During Therapy: WFL for tasks assessed/performed Overall Cognitive Status: Within Functional  Limits for  tasks assessed    Extremity/Trunk Assessment Upper Extremity Assessment Upper Extremity Assessment: Generalized weakness Lower Extremity Assessment Lower Extremity Assessment: Generalized weakness   Balance    End of Session PT - End of Session Equipment Utilized During Treatment: Gait belt Activity Tolerance: Patient tolerated treatment well Patient left: in chair;with call bell/phone within reach Nurse Communication: Mobility status  GP     Toney Sang Beth 05/20/2013, 12:21 PM Delaney Meigs, PT [phone]

## 2013-05-20 NOTE — Progress Notes (Signed)
Nutrition Brief Note  Patient identified on the Malnutrition Screening Tool (MST) Report for unsure of weight loss. Patient's weight has been stable for the past 10 months. Appetite and oral intake has been good.  Wt Readings from Last 15 Encounters:  05/19/13 124 lb 5.4 oz (56.4 kg)  05/19/13 124 lb 5.4 oz (56.4 kg)  05/17/13 121 lb (54.885 kg)  05/13/13 124 lb (56.246 kg)  05/11/13 122 lb 4 oz (55.452 kg)  04/13/13 124 lb 1.9 oz (56.3 kg)  12/09/12 128 lb (58.06 kg)  08/13/12 125 lb (56.7 kg)  08/11/12 125 lb (56.7 kg)  08/10/12 127 lb (57.607 kg)  06/26/12 131 lb 12.8 oz (59.784 kg)  05/07/12 128 lb (58.06 kg)  04/10/12 126 lb 12.8 oz (57.516 kg)  12/12/11 130 lb (58.968 kg)  08/19/11 130 lb (58.968 kg)    Body mass index is 21.68 kg/(m^2). Patient meets criteria for normal weight based on current BMI.   Current diet order is regular, patient is tolerating diet well at this time. Labs and medications reviewed.   No nutrition interventions warranted at this time. If nutrition issues arise, please consult RD.   Joaquin Courts, RD, LDN, CNSC Pager [phone] After Hours Pager [phone]

## 2013-05-20 NOTE — Discharge Summary (Signed)
[facility] Discharge Summary   Patient ID:  Jill Howell MRN: [id number] DOB/AGE: [birth year] 79 y.o.  Admit date: 05/19/2013 Discharge date: 05/20/2013 Date of Surgery: 05/19/2013 Surgeon: Surgeon(s): Sherren Kerns, MD  Admission Diagnosis: Left Internal Carotid Artery Stenosis  Discharge Diagnoses:  Left Internal Carotid Artery Stenosis  Secondary Diagnoses: Past Medical History  Diagnosis Date  . Acute upper respiratory infections of unspecified site   . Unspecified hearing loss   . Unspecified hypothyroidism   . Other and unspecified hyperlipidemia   . Esophageal reflux   . Type II or unspecified type diabetes mellitus without mention of complication, not stated as uncontrolled   . Coronary atherosclerosis of unspecified type of vessel, native or graft     s/p CABG 2000  . MI (myocardial infarction) 2000; 2011  . Congestive heart failure, unspecified   . Atrial fibrillation   . Unspecified asthma(493.90)   . Allergic rhinitis, cause unspecified   . Hypertension     Procedure(s): ENDARTERECTOMY CAROTID-LEFT  Discharged Condition: good  HPI:  Jill Howell is a 78 y.o. female who presents for evaluation of a symptomatic left internal carotid artery stenosis. The patient had an episode recently where she lost her balance and fell off a stool. She was found on duplex scan and CT Angio to have a high-grade left internal carotid artery stenosis. She denies prior history of TIA amaurosis or stroke. She lives alone and does all of her daily activities independently except for some housecleaning which is done by others. She still has some back soreness from a recent fall. This is slightly improved. She did not have a syncopal episode. Other medical problems include reflux, hyperlipidemia, diabetes, coronary disease. On 05/16/13 pt had  got numbness in left forearm and Noticed some changes in voice--words were not intelligible  Started staggering some. Pt was  admitted 05/19/13 for left CEA  Hospital Course:  Jill Howell is a 79 y.o. female is S/P ENDARTERECTOMY CAROTID-LEFT with dacron patch angioplasty Extubated: POD # 0 No diff swallowing Physical exam: slight facial droop Good and equal strength bilat Speech fluent C/O buttock pain over coccyx sec to previous fall Xray of sacrum and coccyx was negative for fracture Post-op wounds healing well Pt. Ambulating, voiding and taking PO diet without difficulty. Pt pain controlled with PO pain meds. Labs as below Complications:none  Consults:     Significant Diagnostic Studies: CBC Lab Results  Component Value Date   WBC 11.2* 05/20/2013   HGB 9.6* 05/20/2013   HCT 27.7* 05/20/2013   MCV 86.3 05/20/2013   PLT 187 05/20/2013    BMET    Component Value Date/Time   NA 131* 05/20/2013 0455   K 4.0 05/20/2013 0455   CL 98 05/20/2013 0455   CO2 23 05/20/2013 0455   GLUCOSE 90 05/20/2013 0455   BUN 11 05/20/2013 0455   CREATININE 0.83 05/20/2013 0455   CALCIUM 8.2* 05/20/2013 0455   GFRNONAA 59* 05/20/2013 0455   GFRAA 69* 05/20/2013 0455   COAG Lab Results  Component Value Date   INR 1.04 05/19/2013     Disposition:  Discharge to :Skilled nursing facility or Home with family if bed not available  Discharge Orders   Future Appointments Provider Department Dept Phone   06/03/2013 1:45 PM Sherren Kerns, MD [facility] [phone]   06/28/2013 10:00 AM Waymon Budge, MD [facility] [phone]   08/17/2013 8:30 AM [facility] Main Office (  [facility]) [phone]   08/17/2013 9:00 AM Cassell Clement, MD [facility] Main Office [address]) [phone]   Future Orders Complete By Expires   Call MD for:  redness, tenderness, or signs of infection (pain, swelling, bleeding, redness, odor or green/yellow discharge around incision site)  As directed    Call MD for:  severe or increased pain, loss or decreased feeling  in affected  limb(s)  As directed    Call MD for:  temperature >100.5  As directed    CAROTID Sugery: Call MD for difficulty swallowing or speaking; weakness in arms or legs that is a new symtom; severe headache.  If you have increased swelling in the neck and/or  are having difficulty breathing, CALL 911  As directed    Discharge patient  As directed    Comments:     Discharge pt to SNF if available or home   Driving Restrictions  As directed    Comments:     No driving for 1- 2 weeks and pt can turn head/neck normally   Increase activity slowly  As directed    Comments:     Walk with assistance use walker or cane as needed   Lifting restrictions  As directed    Comments:     No lifting for 4 weeks   [place]oks shower   As directed    Scheduling Instructions:     Friday   [place] wash over wound with mild soap and water  As directed    No dressing needed  As directed    Resume previous diet  As directed        Medication List         aspirin 81 MG EC tablet  Take 81 mg by mouth daily.     atenolol 25 MG tablet  Commonly known as:  TENORMIN  Take 12.5 mg by mouth daily.     digoxin 0.125 MG tablet  Commonly known as:  LANOXIN  Take 1 tablet (0.125 mg total) by mouth daily.     fexofenadine 180 MG tablet  Commonly known as:  ALLEGRA  Take 180 mg by mouth daily as needed (for allergies).     furosemide 40 MG tablet  Commonly known as:  LASIX  Take 1 tablet (40 mg total) by mouth daily.     levothyroxine 125 MCG tablet  Commonly known as:  SYNTHROID  Take 1 tablet (125 mcg total) by mouth daily.     nitroGLYCERIN 0.4 MG SL tablet  Commonly known as:  NITROSTAT  Place 1 tablet (0.4 mg total) under the tongue every 5 (five) minutes as needed for chest pain.     pantoprazole 40 MG tablet  Commonly known as:  PROTONIX  Take 1 tablet (40 mg total) by mouth daily.     PRESERVISION AREDS PO  Take 1 tablet by mouth daily.     CENTRUM SILVER PO  Take 1 tablet by mouth daily.      pyridoxine 200 MG tablet  Commonly known as:  B-6  Take 200 mg by mouth daily.     simvastatin 40 MG tablet  Commonly known as:  ZOCOR  Take 40 mg by mouth every evening.     vitamin C 1000 MG tablet  Take 1,000 mg by mouth daily.       Verbal and written Discharge instructions given to the patient. Wound care per Discharge AVS     Follow-up Information   Follow up with FIELDS,CHARLES E,  MD In 2 weeks. (office will arrange-sent)    Specialty:  Vascular Surgery   Contact information:   [address] [place] [address] [phone]       Signed: Marlowe Shores 05/20/2013, 3:12 PM  --- For VQI Registry use --- Instructions: Press F2 to tab through selections.  Delete question if not applicable.   Modified Rankin score at D/C (0-6): Rankin Score=0  IV medication needed for:  1. Hypertension: No 2. Hypotension: No  Post-op Complications: Yes  1. Post-op CVA or TIA: No 2. CN injury: Yes  If yes: facial droop  3. Myocardial infarction: No   4.  CHF: No  5.  Dysrhythmia (new): No  6. Wound infection: No  7. Reperfusion symptoms: No  8. Return to OR: No  Discharge medications: Statin use:  Yes ASA use:  Yes Beta blocker use:  Yes ACE-Inhibitor use:no P2Y12 Antagonist use: [x ] None, [ ]  Plavix, [ ]  Plasugrel, [ ]  Ticlopinine, [ ]  Ticagrelor, [ ]  Other, [ ]  No for medical reason, [ ]  Non-compliant, [ ]  Not-indicated Anti-coagulant use:  [x ] None, [ ]  Warfarin, [ ]  Rivaroxaban, [ ]  Dabigatran, [ ]  Other, [ ]  No for medical reason, [ ]  Non-compliant, [ ]  Not-indicated

## 2013-05-20 NOTE — Progress Notes (Signed)
Utilization review completed.  

## 2013-05-20 NOTE — Clinical Social Work Note (Signed)
Clinical Social Work Department BRIEF PSYCHOSOCIAL ASSESSMENT 05/20/2013  Patient:  Jill Howell, Jill Howell     Account Number:  [id number]     Admit date:  05/19/2013  Clinical Social Worker:  Hulan Fray  Date/Time:  05/20/2013 02:45 PM  Referred by:  Care Management  Date Referred:  05/20/2013 Referred for  SNF Placement   Other Referral:   Interview type:  Patient Other interview type:    PSYCHOSOCIAL DATA Living Status:  ALONE Admitted from facility:   Level of care:   Primary support name:  Molly Maduro Belasco Primary support relationship to patient:  CHILD, ADULT Degree of support available:   Strained    CURRENT CONCERNS Current Concerns  Post-Acute Placement   Other Concerns:    SOCIAL WORK ASSESSMENT / PLAN Clinical Social Worker was informed that patient was interested in short term SNF placement at discharge. CSW was informed that discharge is planned for today. CSW spoke with patient and she reported that she was interested in [facility] bed search with a preference for [facility]. CSW informed patient that she did not meet the 3 midnight inpatient stay for medicare and patient voiced understanding. Patient was inquiring about cost for private pay for SNF. CSW informed patient that if discharge is for today, then we will need to proceed with the facility that is available.    There were a few facilities that patient voiced were too far away. CSW problem solved with patient and her plan b is to return home and she stated she has friends who can stay with her if needed, if [facility] did not have availability.    CSW will complete FL2 for MD"s signature and update patient when bed offers are made.   Assessment/plan status:  Psychosocial Support/Ongoing Assessment of Needs Other assessment/ plan:   Information/referral to community resources:   SNF packet    PATIENT'S/FAMILY'S RESPONSE TO PLAN OF CARE: Patient reported that she was interested in SNF  placement and preferred [facility]. There were other facilities on the list that patient reported were too far away from her home. If patient's preferred facility, did not have availability, the she plans to have a friend stay with her in her house.

## 2013-05-20 NOTE — Progress Notes (Signed)
VVS F/U note  Sacral coccyx xray Negative for fracture.  OK to DC PT assessing for [facility]

## 2013-05-20 NOTE — Progress Notes (Addendum)
VASCULAR AND VEIN SURGERY POST - OP CEA PROGRESS NOTE  Date of Surgery: 05/19/2013  Surgeon(s): Sherren Kerns, MD 1 Day Post-Op left CEA .  HPI: Kenidi Elenbaas Marsolek is a 79 y.o. female who is 1 Day Post-Op . Patient is doing well. Pre-operative symptoms of slurred speech and numbness left arm are Improved Patient denies headache; Patient denies difficulty swallowing; denies weakness in upper or lower extremities; Pt. denies other symptoms of stroke or TIA.  C/O pain in buttocks from recent fall, no LOC   Significant Diagnostic Studies: CBC Lab Results  Component Value Date   WBC 11.1* 05/19/2013   HGB 12.7 05/19/2013   HCT 36.5 05/19/2013   MCV 86.1 05/19/2013   PLT 242 05/19/2013    BMET    Component Value Date/Time   NA 127* 05/19/2013 0752   K 5.2* 05/19/2013 0752   CL 94* 05/19/2013 0752   CO2 24 05/19/2013 0752   GLUCOSE 105* 05/19/2013 0752   BUN 16 05/19/2013 0752   CREATININE 0.89 05/19/2013 0752   CALCIUM 9.7 05/19/2013 0752   GFRNONAA 55* 05/19/2013 0752   GFRAA 63* 05/19/2013 0752    COAG Lab Results  Component Value Date   INR 1.04 05/19/2013   No results found for this basename: PTT      Intake/Output Summary (Last 24 hours) at 05/20/13 0742 Last data filed at 05/20/13 0530  Gross per 24 hour  Intake 2507.5 ml  Output   1275 ml  Net 1232.5 ml    Physical Exam:  BP Readings from Last 3 Encounters:  05/20/13 108/32  05/20/13 108/32  05/17/13 130/60   Temp Readings from Last 3 Encounters:  05/20/13 99.2 F (37.3 C) Oral  05/20/13 99.2 F (37.3 C) Oral  05/11/13 97.9 F (36.6 C) Oral   SpO2 Readings from Last 3 Encounters:  05/20/13 96%  05/20/13 96%  05/17/13 98%   Pulse Readings from Last 3 Encounters:  05/20/13 71  05/20/13 71  05/17/13 62    Pt is A&O x 3 Gait is normal/ guarded with nurse assist Speech is fluent left Neck Wound is healing well Patient with Negative tongue deviation and Positive facial droop on left Pt has good and  equal strength in all extremities Pt very tender over coccyx  Assessment/Plan:: Aylen S Nickle is a 79 y.o. female is S/P Left CEA Pt is voiding, ambulating and taking po Pt lives alone but has sons in the area who can stay with her Recent fall onto buttocks - tender over coccyx - will get x ray of pelvis to R/O coccyx fx Pt ambulating without pain in pelvis or hips Pt eval - pt does not want walker or cane Care mgmnt for [facility] needs - pt has been very independent at home    ROCZNIAK,REGINA J  05/20/2013 7:42 AM  Overall looks well No hematoma Neuro intact  Pt confirms right facial droop is chronic D/c home today if home situation is ok Ok to get xray but treatment is symptomatic and will primarily need time.  This is also chronic.  Fabienne Bruns, MD [facility] [place] Office: [phone] Pager: [phone]

## 2013-05-20 NOTE — Progress Notes (Signed)
   CARE MANAGEMENT NOTE 05/20/2013  Patient:  Jill Howell, Jill Howell   Account Number:  [id number]  Date Initiated:  05/20/2013  Documentation initiated by:  Donn Pierini  Subjective/Objective Assessment:   Pt admitted s/p carotid     Action/Plan:   PTA pt lived at home alone, independent and still drives- PT to eval   Anticipated DC Date:  05/20/2013   Anticipated DC Plan:    In-house referral  Clinical Social Worker      DC Planning Services  CM consult      [facility] Choice  HOME HEALTH   Choice offered to / List presented to:  C-1 Patient           Status of service:  In process, will continue to follow Medicare Important Message given?   (If response is "NO", the following Medicare IM given date fields will be blank) Date Medicare IM given:   Date Additional Medicare IM given:    Discharge Disposition:    Per UR Regulation:  Reviewed for med. necessity/level of care/duration of stay  If discussed at Long Length of Stay Meetings, dates discussed:    Comments:  05/20/13- 1100- Donn Pierini RN BSN [phone] Pt resting, spoke with pt's son regarding potential d/c needs- pt lives at home alone, has son nearby- per son pt does not use any DME at home and pt's son states that they would like to keep pt as independent  as possible. Pt was driving prior to admission. PT eval pending.  1200- per PT eval - recommendation for SNF- pt will not have a 3 night qualifying stay per Medicare guidelines- in to speak with pt about d/c plans- son not present for conversation- pt states that she feels she could benefit from some rehab and is agreeable to SNF placement- explained to pt about medicare guidelines and that she would be looking at paying out of pocket- pt states that she would be ok with out of pocket depending on cost- pt would prefer SNF vs HH as she states she does not have 24/7 assistance nor someone to help her at home. Called  CSW for possible SNF placement-if placement not an option due  to cost then pt understands she will return home with [facility] services- NCM to cont. to follow and arrange [facility] and DME if needed.

## 2013-05-21 ENCOUNTER — Encounter (HOSPITAL_COMMUNITY): Payer: Self-pay | Admitting: Vascular Surgery

## 2013-05-21 ENCOUNTER — Other Ambulatory Visit (HOSPITAL_COMMUNITY): Payer: Medicare Other

## 2013-05-21 NOTE — Progress Notes (Signed)
Patient has been discharged home. Have reviewed discharge instructions with patient,son and son's friend. Patient has a rollling walker and we have instructed the patient and son that the patient can not be left alone so family is setting up home health to come out and check on the patient. All questions and concerns have been addressed. IV has been D/C.

## 2013-05-21 NOTE — Progress Notes (Signed)
Patient states that this numbness has happened before. Once awake and moving around full sensation returned.

## 2013-05-21 NOTE — Progress Notes (Signed)
MEDICARE-CERTIFIED HOME HEALTH AGENCIES [place]  Agencies that are Medicare-Certified and are affiliated with [facility]  Home Health Agency  Telephone Number Address  [facility]  The [facility] has ownership interest in this company; however, you are under no obligation to use this agency. [phone] or [phone] [address] [place], [place]  [address] [email or web address]    Agencies that are Medicare-Certified and are not affiliated with [facility]   Home Health Agency  Telephone Number Address  [facility] [phone] Fax [phone] [address] [place], [place]  [address] [email or web address]  [facility] [phone] [address] [place], [place] [address] [email or web address]  [facility] [phone] [address] [place], [place]  [address]  [facility]  [phone] [address] [place], [place] [address]  [facility]  [phone] Fax [phone] [address] [place], [place]  [address] [email or web address]  [facility] [phone] or [phone] [address] [place], [place] [address]  [facility] [phone] [address] [place], [place] [address] [email or web address]  [facility] [phone]  [address] [place], [place] [address] [email or web address]  [facility] [phone] or [phone]  [address] [place], [place]  [address] [email or web address]    [facility] [phone] Fax [phone] [address] [place], [place]  [address]  [facility]  [phone]  Fax [phone] [address] [place], [place]  [address]       Agencies that are not Medicare-Certified and are not affiliated with [facility]   Home Health Agency Telephone Number Address  [facility] [phone] Fax [phone] [address] [place], [place] [address]  [facility] [phone] or [phone] Fax [phone] [address] [place], [place]  [address] [email or web address]  [facility] [phone] Fax [phone] [address] [place], [place]  [address]  [facility]  [phone] [address] [place], [place] [email or web address]  [facility] [phone] [address] [place], [place] [address]  [facility] [phone] Fax [phone] [address] [place], [place] [address]  [facility] [phone] Fax [phone] [address] [place], [place]  [address] [email or web address]  [facility] [phone] Fax [phone] [address] [place], [place]  [address] [email or web address]  [facility] [phone] [address] [place], [place] [address] [email or web address]  [facility] [phone] [address] , [place]  [address] [email or web address]   

## 2013-05-21 NOTE — Progress Notes (Signed)
Hospital discharge yesterday delayed due to social issues.  Pt is going home today with family support.   PE:  Filed Vitals:   05/21/13 0022 05/21/13 0451 05/21/13 0822 05/21/13 1037  BP: 90/25 111/41 132/38   Pulse: 63 66  75  Temp: 98.7 F (37.1 C) 97.8 F (36.6 C) 98.1 F (36.7 C)   TempSrc: Oral Oral Oral   Resp:  25    Height:      Weight:      SpO2: 93% 100%      Left neck no hematoma Neuro intact  D/c home  Fabienne Bruns, MD [facility] [place] Office: [phone] Pager: [phone]

## 2013-05-21 NOTE — Care Management Note (Signed)
Page 1 of 2   05/21/2013     12:03:39 PM   CARE MANAGEMENT NOTE 05/21/2013  Patient:  Jill Howell, Jill Howell   Account Number:  [id number]  Date Initiated:  05/20/2013  Documentation initiated by:  Donn Pierini  Subjective/Objective Assessment:   Pt admitted s/p carotid     Action/Plan:   PTA pt lived at home alone, independent and still drives- PT to eval   Anticipated DC Date:  05/21/2013   Anticipated DC Plan:  HOME W HOME HEALTH SERVICES  In-house referral  Clinical Social Worker      DC Associate Professor  CM consult      [facility] Choice  HOME HEALTH   Choice offered to / List presented to:  C-1 Patient   DME arranged  WALKER - Lavone Nian      DME agency  [facility]     HH arranged  HH-2 PT  HH-6 SOCIAL WORKER      HH agency  [facility]   Status of service:  Completed, signed off Medicare Important Message given?   (If response is "NO", the following Medicare IM given date fields will be blank) Date Medicare IM given:   Date Additional Medicare IM given:    Discharge Disposition:  HOME W HOME HEALTH SERVICES  Per UR Regulation:  Reviewed for med. necessity/level of care/duration of stay  If discussed at Long Length of Stay Meetings, dates discussed:    Comments:  05/21/13- 1100- Donn Pierini RN, BSN [phone] F/U done this am with CSW regarding SNF options- spoke with pt at bedside along with Verlon Au, CSW- to inform pt of her bed offers and out of pocket costs to go to SNF- pt states that the cost for SNF is too great and she also does not feel like she will need to stay there for a month so pt is choosing to return home with [facility] services- she is stating that she can find someone to come stay with her. Have also spoken with son and daughter/in/law this am who have researched private pay options to get assistance for pt to have someone to stay with her for short term- also gave pt and son a list for private pay agencies for reference and  assistance. Discussed with pt HH services for PT and CSW - pt is agreeable to this- and has used [facility] in past- and would like to use them again. Referral for [facility] services called to Lupita Leash with [facility]- services to begin within 24-48 hr post discharge. pt is reluctant but agreeable to a RW- will have order placed and [facility] to bring to room prior to d/c- pt states that she has a BSC and shower chair at home. Pt and family understand recommendations for 24 hr supervision/assistance upon discharge and are working towards getting that arranged for pt.   05/20/13- 1100- Donn Pierini RN BSN [phone] Pt resting, spoke with pt's son regarding potential d/c needs- pt lives at home alone, has son nearby- per son pt does not use any DME at home and pt's son states that they would like to keep pt as independent  as possible. Pt was driving prior to admission. PT eval pending.  1200- per PT eval - recommendation for SNF- pt will not have a 3 night qualifying stay per Medicare guidelines- in to speak with pt about d/c plans- son not present for conversation- pt states that she feels she could benefit from some rehab and is agreeable to  SNF placement- explained to pt about medicare guidelines and that she would be looking at paying out of pocket- pt states that she would be ok with out of pocket depending on cost- pt would prefer SNF vs HH as she states she does not have 24/7 assistance nor someone to help her at home. Called  CSW for possible SNF placement-if placement not an option due to cost then pt understands she will return home with [facility] services- NCM to cont. to follow and arrange [facility] and DME if needed.

## 2013-05-21 NOTE — Clinical Social Work Note (Signed)
Clinical Social Worker spoke with patient regarding SNF option and potential of private pay. CSW received a bed offer yesterday and that facility was asking for $14,700 for two months up front. CSW informed patient and her son and his wife of this yesterday. Family and patient did reported that they could not afford that amount. CSW problem solved with family and asked for them to come up with a plan B of home option and who they could arrange for additional supervision in her home. CSW followed up with family today with CM, and informed them that [facility], their preferred facility did make a bed offer, but it's still private pay. Their rate is $10,050 for 30 days up front and CSW informed the patient of this and patient reported that she could not afford this amount and did not think she would need 30 days of rehab. The plan is for patient to return home with home health services and CM was already speaking with patient of this, when CSW left room. Patient appeared to have arrangements for people to come stay with her. CSW will sign off, as social work intervention is no longer needed.   Rozetta Nunnery MSW, [employer] [phone]

## 2013-05-22 DIAGNOSIS — Z9181 History of falling: Secondary | ICD-10-CM | POA: Diagnosis not present

## 2013-05-22 DIAGNOSIS — I2510 Atherosclerotic heart disease of native coronary artery without angina pectoris: Secondary | ICD-10-CM | POA: Diagnosis not present

## 2013-05-22 DIAGNOSIS — I10 Essential (primary) hypertension: Secondary | ICD-10-CM | POA: Diagnosis not present

## 2013-05-22 DIAGNOSIS — J45909 Unspecified asthma, uncomplicated: Secondary | ICD-10-CM | POA: Diagnosis not present

## 2013-05-22 DIAGNOSIS — E119 Type 2 diabetes mellitus without complications: Secondary | ICD-10-CM | POA: Diagnosis not present

## 2013-05-22 DIAGNOSIS — IMO0001 Reserved for inherently not codable concepts without codable children: Secondary | ICD-10-CM | POA: Diagnosis not present

## 2013-05-22 DIAGNOSIS — I252 Old myocardial infarction: Secondary | ICD-10-CM | POA: Diagnosis not present

## 2013-05-22 DIAGNOSIS — I509 Heart failure, unspecified: Secondary | ICD-10-CM | POA: Diagnosis not present

## 2013-05-22 DIAGNOSIS — Z48812 Encounter for surgical aftercare following surgery on the circulatory system: Secondary | ICD-10-CM | POA: Diagnosis not present

## 2013-05-22 DIAGNOSIS — M545 Low back pain: Secondary | ICD-10-CM | POA: Diagnosis not present

## 2013-05-24 DIAGNOSIS — E119 Type 2 diabetes mellitus without complications: Secondary | ICD-10-CM | POA: Diagnosis not present

## 2013-05-24 DIAGNOSIS — J45909 Unspecified asthma, uncomplicated: Secondary | ICD-10-CM | POA: Diagnosis not present

## 2013-05-24 DIAGNOSIS — I2510 Atherosclerotic heart disease of native coronary artery without angina pectoris: Secondary | ICD-10-CM | POA: Diagnosis not present

## 2013-05-24 DIAGNOSIS — Z48812 Encounter for surgical aftercare following surgery on the circulatory system: Secondary | ICD-10-CM | POA: Diagnosis not present

## 2013-05-24 DIAGNOSIS — IMO0001 Reserved for inherently not codable concepts without codable children: Secondary | ICD-10-CM | POA: Diagnosis not present

## 2013-05-24 DIAGNOSIS — I509 Heart failure, unspecified: Secondary | ICD-10-CM | POA: Diagnosis not present

## 2013-05-26 DIAGNOSIS — J45909 Unspecified asthma, uncomplicated: Secondary | ICD-10-CM | POA: Diagnosis not present

## 2013-05-26 DIAGNOSIS — I2510 Atherosclerotic heart disease of native coronary artery without angina pectoris: Secondary | ICD-10-CM | POA: Diagnosis not present

## 2013-05-26 DIAGNOSIS — I509 Heart failure, unspecified: Secondary | ICD-10-CM | POA: Diagnosis not present

## 2013-05-26 DIAGNOSIS — Z48812 Encounter for surgical aftercare following surgery on the circulatory system: Secondary | ICD-10-CM | POA: Diagnosis not present

## 2013-05-26 DIAGNOSIS — E119 Type 2 diabetes mellitus without complications: Secondary | ICD-10-CM | POA: Diagnosis not present

## 2013-05-26 DIAGNOSIS — IMO0001 Reserved for inherently not codable concepts without codable children: Secondary | ICD-10-CM | POA: Diagnosis not present

## 2013-05-27 ENCOUNTER — Telehealth: Payer: Self-pay | Admitting: Vascular Surgery

## 2013-05-27 NOTE — Telephone Encounter (Signed)
Spoke with patient by phone.  She seems to be doing well.  Gave her our office number if she has problems.  Spoke with Home senior care that is fixing meals and reassured she is doing well.   Fabienne Bruns, MD [facility] Office: [phone] Pager: [phone]

## 2013-05-28 DIAGNOSIS — Z48812 Encounter for surgical aftercare following surgery on the circulatory system: Secondary | ICD-10-CM | POA: Diagnosis not present

## 2013-05-28 DIAGNOSIS — IMO0001 Reserved for inherently not codable concepts without codable children: Secondary | ICD-10-CM | POA: Diagnosis not present

## 2013-05-28 DIAGNOSIS — J45909 Unspecified asthma, uncomplicated: Secondary | ICD-10-CM | POA: Diagnosis not present

## 2013-05-28 DIAGNOSIS — I2510 Atherosclerotic heart disease of native coronary artery without angina pectoris: Secondary | ICD-10-CM | POA: Diagnosis not present

## 2013-05-28 DIAGNOSIS — I509 Heart failure, unspecified: Secondary | ICD-10-CM | POA: Diagnosis not present

## 2013-05-28 DIAGNOSIS — E119 Type 2 diabetes mellitus without complications: Secondary | ICD-10-CM | POA: Diagnosis not present

## 2013-05-31 ENCOUNTER — Telehealth: Payer: Self-pay

## 2013-05-31 NOTE — Telephone Encounter (Signed)
Phone call from pt.  Reports she has an earache in left ear.  States daughter looked in her left ear with a bright light, and "saw a clump of dried wax."  Reports her daughter put a few drops of sweet oil in the left ear.  Questions if she should see an ear specialist for the ear ache?  Reports not taking pain medication, except at night.  States only able to take Tylenol for pain.  Reassured pt. that ear ache is not uncommon with carotid endarterectomy surgery; encouraged her to take Tylenol q 4-6 hrs., as needed for the ear pain, or incision pain.  Stated she will be seeing Dr. Darrick Penna in follow-up on Thursday, 9/25.  Advised to discuss with Dr. Darrick Penna at her appt.

## 2013-06-01 DIAGNOSIS — E119 Type 2 diabetes mellitus without complications: Secondary | ICD-10-CM | POA: Diagnosis not present

## 2013-06-01 DIAGNOSIS — I509 Heart failure, unspecified: Secondary | ICD-10-CM | POA: Diagnosis not present

## 2013-06-01 DIAGNOSIS — J45909 Unspecified asthma, uncomplicated: Secondary | ICD-10-CM | POA: Diagnosis not present

## 2013-06-01 DIAGNOSIS — IMO0001 Reserved for inherently not codable concepts without codable children: Secondary | ICD-10-CM | POA: Diagnosis not present

## 2013-06-01 DIAGNOSIS — I2510 Atherosclerotic heart disease of native coronary artery without angina pectoris: Secondary | ICD-10-CM | POA: Diagnosis not present

## 2013-06-01 DIAGNOSIS — Z48812 Encounter for surgical aftercare following surgery on the circulatory system: Secondary | ICD-10-CM | POA: Diagnosis not present

## 2013-06-02 ENCOUNTER — Encounter: Payer: Self-pay | Admitting: Vascular Surgery

## 2013-06-02 DIAGNOSIS — IMO0001 Reserved for inherently not codable concepts without codable children: Secondary | ICD-10-CM | POA: Diagnosis not present

## 2013-06-02 DIAGNOSIS — Z48812 Encounter for surgical aftercare following surgery on the circulatory system: Secondary | ICD-10-CM | POA: Diagnosis not present

## 2013-06-02 DIAGNOSIS — I2510 Atherosclerotic heart disease of native coronary artery without angina pectoris: Secondary | ICD-10-CM | POA: Diagnosis not present

## 2013-06-02 DIAGNOSIS — J45909 Unspecified asthma, uncomplicated: Secondary | ICD-10-CM | POA: Diagnosis not present

## 2013-06-02 DIAGNOSIS — E119 Type 2 diabetes mellitus without complications: Secondary | ICD-10-CM | POA: Diagnosis not present

## 2013-06-02 DIAGNOSIS — I509 Heart failure, unspecified: Secondary | ICD-10-CM | POA: Diagnosis not present

## 2013-06-03 ENCOUNTER — Encounter: Payer: Self-pay | Admitting: Vascular Surgery

## 2013-06-03 ENCOUNTER — Ambulatory Visit (INDEPENDENT_AMBULATORY_CARE_PROVIDER_SITE_OTHER): Payer: Medicare Other | Admitting: Vascular Surgery

## 2013-06-03 DIAGNOSIS — I6529 Occlusion and stenosis of unspecified carotid artery: Secondary | ICD-10-CM

## 2013-06-03 NOTE — Progress Notes (Signed)
79 year old female who is 2 weeks status post left carotid endarterectomy. She still has some slight deviation to the left of her tongue. She also has some numbness in the left mandible area. The left neck incision is healing well. There is no drainage. She has had no episodes of TIA amaurosis or stroke. She is on aspirin and a statin. She continues to live independently. She has some slurred speech preoperatively. This has not returned.  Current Outpatient Prescriptions on File Prior to Visit  Medication Sig Dispense Refill  . Ascorbic Acid (VITAMIN C) 1000 MG tablet Take 1,000 mg by mouth daily.        [name] aspirin 81 MG EC tablet Take 81 mg by mouth daily.        [name] atenolol (TENORMIN) 25 MG tablet Take 12.5 mg by mouth daily.       . digoxin (LANOXIN) 0.125 MG tablet Take 1 tablet (0.125 mg total) by mouth daily.  90 tablet  3  . fexofenadine (ALLEGRA) 180 MG tablet Take 180 mg by mouth daily as needed (for allergies).      . furosemide (LASIX) 40 MG tablet Take 1 tablet (40 mg total) by mouth daily.  90 tablet  3  . levothyroxine (SYNTHROID) 125 MCG tablet Take 1 tablet (125 mcg total) by mouth daily.  90 tablet  3  . Multiple Vitamins-Minerals (CENTRUM SILVER PO) Take 1 tablet by mouth daily.       . Multiple Vitamins-Minerals (PRESERVISION AREDS PO) Take 1 tablet by mouth daily.      . nitroGLYCERIN (NITROSTAT) 0.4 MG SL tablet Place 1 tablet (0.4 mg total) under the tongue every 5 (five) minutes as needed for chest pain.  100 tablet  prn  . pantoprazole (PROTONIX) 40 MG tablet Take 1 tablet (40 mg total) by mouth daily.  90 tablet  3  . pyridoxine (B-6) 200 MG tablet Take 200 mg by mouth daily.        . simvastatin (ZOCOR) 40 MG tablet Take 40 mg by mouth every evening.       No current facility-administered medications on file prior to visit.   Physical exam:  Filed Vitals:   06/03/13 1343 06/03/13 1345  BP: 146/52 135/51  Pulse: 72   Temp: 98.5 F (36.9 C)   TempSrc: Oral   Height:  5' 3.5" (1.613 m)   Weight: 121 lb 8 oz (55.112 kg)   SpO2: 98%    Neck: No carotid bruits 2+ carotid pulses well-healed left neck incision  Neuro: Symmetric upper chimney and lower extremity motor strength which is 5 over 5, very mild left tongue deviation at the very tip of her tongue. Slight decreased sensation under the left mandible.  Assessment: Doing well status post carotid endarterectomy. Followup 6 months repeat duplex exam.  Fabienne Bruns, MD [facility] Office: [phone] Pager: [phone]

## 2013-06-04 DIAGNOSIS — I2510 Atherosclerotic heart disease of native coronary artery without angina pectoris: Secondary | ICD-10-CM | POA: Diagnosis not present

## 2013-06-04 DIAGNOSIS — Z48812 Encounter for surgical aftercare following surgery on the circulatory system: Secondary | ICD-10-CM | POA: Diagnosis not present

## 2013-06-04 DIAGNOSIS — E119 Type 2 diabetes mellitus without complications: Secondary | ICD-10-CM | POA: Diagnosis not present

## 2013-06-04 DIAGNOSIS — IMO0001 Reserved for inherently not codable concepts without codable children: Secondary | ICD-10-CM | POA: Diagnosis not present

## 2013-06-04 DIAGNOSIS — J45909 Unspecified asthma, uncomplicated: Secondary | ICD-10-CM | POA: Diagnosis not present

## 2013-06-04 DIAGNOSIS — I509 Heart failure, unspecified: Secondary | ICD-10-CM | POA: Diagnosis not present

## 2013-06-07 DIAGNOSIS — J45909 Unspecified asthma, uncomplicated: Secondary | ICD-10-CM | POA: Diagnosis not present

## 2013-06-07 DIAGNOSIS — I2510 Atherosclerotic heart disease of native coronary artery without angina pectoris: Secondary | ICD-10-CM | POA: Diagnosis not present

## 2013-06-07 DIAGNOSIS — E119 Type 2 diabetes mellitus without complications: Secondary | ICD-10-CM | POA: Diagnosis not present

## 2013-06-07 DIAGNOSIS — IMO0001 Reserved for inherently not codable concepts without codable children: Secondary | ICD-10-CM | POA: Diagnosis not present

## 2013-06-07 DIAGNOSIS — Z48812 Encounter for surgical aftercare following surgery on the circulatory system: Secondary | ICD-10-CM | POA: Diagnosis not present

## 2013-06-07 DIAGNOSIS — I509 Heart failure, unspecified: Secondary | ICD-10-CM | POA: Diagnosis not present

## 2013-06-08 ENCOUNTER — Telehealth: Payer: Self-pay

## 2013-06-08 NOTE — Telephone Encounter (Signed)
Phone call from pt. States she has headache since Saturday "on top of my head".  Rates pain at 7/10.  Reports taking Tylenol for the pain, and gets relief of headache temporarily.  Denies any loss of vision, weakness of extremities; problems with balance, difficulty with speech, or any other symptoms.  Last office visit was 9/25.  Stated she wasn't having the headache when she saw Dr. Darrick Penna at last appt.  BP stable at office visit 9/25; 146/52 and 135/51.  Discussed w/ Dr. Arbie Cookey.  Stated it is unlikely a reperfusion injury at this time in the post-op period.  Called pt. Back; reassured her that headache not likely related to any complication from surgery, and to continue to monitor.  Advised to call PCP or [facility] if symptoms persist.  Verb. Understanding.   Stated her Physical Therapist has been checking her blood pressure.

## 2013-06-09 DIAGNOSIS — IMO0001 Reserved for inherently not codable concepts without codable children: Secondary | ICD-10-CM | POA: Diagnosis not present

## 2013-06-09 DIAGNOSIS — I509 Heart failure, unspecified: Secondary | ICD-10-CM | POA: Diagnosis not present

## 2013-06-09 DIAGNOSIS — Z48812 Encounter for surgical aftercare following surgery on the circulatory system: Secondary | ICD-10-CM | POA: Diagnosis not present

## 2013-06-09 DIAGNOSIS — E119 Type 2 diabetes mellitus without complications: Secondary | ICD-10-CM | POA: Diagnosis not present

## 2013-06-09 DIAGNOSIS — J45909 Unspecified asthma, uncomplicated: Secondary | ICD-10-CM | POA: Diagnosis not present

## 2013-06-09 DIAGNOSIS — I2510 Atherosclerotic heart disease of native coronary artery without angina pectoris: Secondary | ICD-10-CM | POA: Diagnosis not present

## 2013-06-10 ENCOUNTER — Telehealth: Payer: Self-pay | Admitting: Cardiology

## 2013-06-10 DIAGNOSIS — H903 Sensorineural hearing loss, bilateral: Secondary | ICD-10-CM | POA: Diagnosis not present

## 2013-06-10 DIAGNOSIS — H6120 Impacted cerumen, unspecified ear: Secondary | ICD-10-CM | POA: Diagnosis not present

## 2013-06-10 NOTE — Telephone Encounter (Signed)
Keep taking the meds as she has been doing.

## 2013-06-10 NOTE — Telephone Encounter (Signed)
New Problem   Pt is concerned about the correct dosages of Atenolol and furosemide, she believes she maybe taking to little or too much. Request a call back to discuss.

## 2013-06-10 NOTE — Telephone Encounter (Signed)
Patient states bottle for Atenolol says 1 full tablet daily but hospital d/c says 1/2 tablet and that is what she has been doing. Advised to continue 1/2 Atenolol 25 mg daily as she has been doing. Verbalized understanding. Has decreased her Furosemide to 40 mg to 1/2 daily like she was prior to going into the hospital. States it makes her have to go to the restroom to frequently. Will forward to  Dr. Patty Sermons for review

## 2013-06-11 NOTE — Telephone Encounter (Signed)
Advised patient

## 2013-06-28 ENCOUNTER — Ambulatory Visit (INDEPENDENT_AMBULATORY_CARE_PROVIDER_SITE_OTHER): Payer: Medicare Other | Admitting: Internal Medicine

## 2013-06-28 ENCOUNTER — Encounter: Payer: Self-pay | Admitting: Internal Medicine

## 2013-06-28 VITALS — BP 124/58 | HR 62 | Ht 64.0 in | Wt 118.6 lb

## 2013-06-28 DIAGNOSIS — J45909 Unspecified asthma, uncomplicated: Secondary | ICD-10-CM

## 2013-06-28 DIAGNOSIS — I779 Disorder of arteries and arterioles, unspecified: Secondary | ICD-10-CM

## 2013-06-28 DIAGNOSIS — Z23 Encounter for immunization: Secondary | ICD-10-CM

## 2013-06-28 MED ORDER — AZELASTINE-FLUTICASONE 137-50 MCG/ACT NA SUSP: 2.0000 | Freq: Every day | NASAL | Status: DC

## 2013-06-28 NOTE — Progress Notes (Signed)
Subjective:    Patient ID: Jill Howell, female    DOB: 1919/01/24, 79 y.o.   MRN: [id number]  HPI 80 yo female with known hx of AB, AR  06/23/08- Thinks if she takes Centrum Silver vitamin, or eats her own homemade applesauce she gets a burning sensation, bilateral axillary/ ribs. No GI/GU or chest pain/palpitation otherwise. Denies sneeze, rhinorhea, wheeze or cough.  Continues allergy vaccine 1:10 every 2 weeks.   11/23/08-Asthmatic bronchitis, rhinits, CAD/CHF/AF  4-5 days hard left frontal and maxillary headache, watery rhinorhea. Ears ok, no fever or purulent discharge. Chest feels normal for her.  She is convinced allergy vaccine helps her.   June 22, 2009- Asthmatic bronchitis, rhinitis, CAD. CHF  Pain behind right ear since [place]. Has had ENT and carotid artery eval. Hearing aids.  Discussed flu shot.  Hauled dirt yesterday- still active. Morning cough with phlegm.   June 22, 2010- Asthmatic bronchitis, rhinitis, CAD/ MI. CHF  Nurse:cc Yearly follow up visit-asthma; Breathing is good per patient.  Says she has been doing well recently. She had an MI in February/ Dr Mare Ferrari.  Ribs get sore when shes tired. Then she tells me she picked up 20 bushels or walnuts- showing me her stained hands. Takes an antihistamine but doesn't know what it is- says prescription.  Had mold in home- remediated. No asthma or inhaler. Sometimes morning cough.  Nurse neighbor still gives allergy vaccine every 2 weeks. Discussed Epipen. Feels a little bump in right external ear canal- worried because friend had a cancer there.  05/09/2011 Acute walk in  Pt complains of rash on back for 1 week.  Was seen at Urgent care this am for rash - Pimple like rash across back and sides and on feet - Itching - Pt reports pulling a tick off under right arm this am. Unclear is this was a tick or bug because it crawled away. Urgent care dx w/ allergic dermatitis and rx Clarirtin.[name] Lesions are very pruritics. Has been  scratching and try to squeeze them. Believes she did remove 1 tick and but friend saw very small insect vs ticks crawling. She has greater than 25 bumps on back.  Works outside a lot.   06/21/11-79 year old female never smoker followed for Asthmatic bronchitis, rhinitis, complicated by CAD/ MI/ AF CHF, DM, GERD She continues allergy vaccine given by a neighbor/nurse without problems. She gets her shots every 2-3 weeks. He is using a dehumidifier because of mold in her house. So far the fall season is comfortable for her with occasional sneeze. She remains active and talks about picking up 20 bushels of walnuts from her property. Chest x-ray 12/15/2010 showed changes of COPD with no acute process. I talked with her about stopping allergy vaccine after all this time and at her age. She feels comfortable and safe with them and is considering my suggestion.  06/26/12- 79 year old female never smoker followed for Asthmatic bronchitis, rhinitis, complicated by CAD/ MI/ AF CHF, DM, GERD Stopped vaccine; states having some cough-clear in color-mainly happens in morning time. Can't tell me for how long this has been happening. Not concerned. Occ sneeze and blowing nose.  Asks refill red cough syrup "no codeine". Says over-the-counter cough syrups were no help. We discussed cough versus wheeze. She has been off allergy vaccine for the past year.   06/28/13- 79 year old female never smoker followed for Asthmatic bronchitis, rhinitis, complicated by CAD/ MI/ AF CHF, DM, GERD FOLLOWS FOR: increased in cough-clear in color. Recently (about a month  ago) had "artery surgery". now has increased mucus in mouth. Tolerated L CEA surgery. Cough makes throat sore, esp when she eats. CXR 05/19/13 IMPRESSION:  1. Cardiomegaly without pulmonary edema.  2. Bronchitic changes, likely chronic.  Original Report Authenticated By: Norva Pavlov, M.D.   Review of Systems- see HPI Constitutional:   No-   weight loss, night  sweats, fevers, chills, fatigue, lassitude. HEENT:   No-  headaches, difficulty swallowing, tooth/dental problems, sore throat,       + sneezing, no- itching, ear ache, nasal congestion, post nasal drip,  CV:  No-   chest pain, orthopnea, PND, swelling in lower extremities, anasarca, dizziness, palpitations Resp: No-   shortness of breath with exertion or at rest.              No-  productive cough,  + non-productive cough,  No-  coughing up of blood.              No-   change in color of mucus.  No- wheezing.   Skin: No-   rash or lesions. GI:  No-   heartburn, indigestion, abdominal pain, nausea, vomiting,  GU:  MS:  No-   joint pain or swelling.  . Neuro-   Psych:  No- change in mood or affect. No depression or anxiety.  No memory loss.  Objective:   Physical Exam General- Alert, Oriented, Affect-appropriate, Distress- none acute, slender, very alert, talkative Skin- rash-none, lesions- none, excoriation- none.  Lymphadenopathy- none Head- atraumatic            Eyes- Gross vision intact, PERRLA, conjunctivae clear secretions            Ears- Hearing, canals-normal for age            Nose- Clear, no-Septal dev, mucus, polyps, erosion, perforation             Throat- Mallampati II , mucosa clear , drainage- none, tonsils- atrophic. + tongue dev slightly to L Neck- flexible , trachea midline, no stridor , thyroid nl, carotid no bruit. Healing L incision Chest - symmetrical excursion , unlabored           Heart/CV- RRR , no murmur , no gallop  , no rub, nl s1 s2                           - JVD- none , edema- none, stasis changes- none, varices- none           Lung- clear, unlabored, wheeze- none, cough+ minor throat clearing , dullness-none, rub- none           Chest wall-  Abd-  Br/ Gen/ Rectal- Not done, not indicated Extrem- cyanosis- none, clubbing, none, atrophy- none, strength- nl Neuro- grossly intact to observation

## 2013-06-28 NOTE — Patient Instructions (Signed)
Flu vax- hi dose  Please call as needed  Sample Dymista nasal spray   1-2 puffs each nostril once daily at bedtime. See if this helps dry the mucus in your throat.  You can use otc Allegra/ fexofenadine if needed as an antihistamine allergy pill  Be careful, slow and deliberate chewing and swallowing so you don't choke while eating.

## 2013-07-10 ENCOUNTER — Other Ambulatory Visit: Payer: Self-pay | Admitting: Cardiology

## 2013-07-12 ENCOUNTER — Telehealth: Payer: Self-pay

## 2013-07-12 NOTE — Telephone Encounter (Signed)
Pt. called to report an episode last week that her left hand went numb from the wrist down to fingertips.  Stated the left hand numbness occurred at night, when she was laying on her right side. Reports rubbing the hand for awhile, and the numbness went away.  Denies any other symptoms of change in sensation or weakness of left leg, loss of vision of left eye, dizziness, balance problems, or difficulty with speech.  Stated she had similar episode with her left arm going numb from elbow to hand and difficulty with speech prior to her carotid surgery.  Pt. denied any difficulty with speech with recent episode of numbness of hand.  Questioned pt. If she had reported numbness of left hand to her PCP; stated she hadn't reported this.  Will discuss w/ Dr. Myra Gianotti.

## 2013-07-12 NOTE — Telephone Encounter (Signed)
Discussed symptoms with Dr. Myra Gianotti.  Stated he did not think the numbness of her left hand was related to carotid artery disease.  (last CTA of neck showed right ICA stenosis @ 40%)  Pt. advised if symptoms of numbness left hand continues, to call PCP.  Verb. Understanding.

## 2013-07-14 NOTE — Assessment & Plan Note (Signed)
Complains of increased salivation after carotid endarterectomy. It could be real, or she could be having some mild dysphagia and pooling oral secretions. Plan-try sample Dymista to see if that dries her mouth up any. Flu vax

## 2013-07-14 NOTE — Assessment & Plan Note (Signed)
Controlled at this time

## 2013-08-10 ENCOUNTER — Encounter (HOSPITAL_COMMUNITY): Payer: Self-pay | Admitting: General Practice

## 2013-08-10 ENCOUNTER — Telehealth: Payer: Self-pay | Admitting: Cardiology

## 2013-08-10 ENCOUNTER — Encounter (HOSPITAL_COMMUNITY)
Admission: AD | Disposition: A | Discharge status: Home Health | Payer: Self-pay | Source: Ambulatory Visit | Attending: Cardiology

## 2013-08-10 ENCOUNTER — Inpatient Hospital Stay (HOSPITAL_COMMUNITY)
Admission: AD | Admit: 2013-08-10 | Discharge: 2013-08-12 | DRG: 281 | Disposition: A | Discharge status: Home Health | Payer: Medicare Other | Source: Ambulatory Visit | Attending: Cardiology | Admitting: Cardiology

## 2013-08-10 ENCOUNTER — Encounter: Payer: Self-pay | Admitting: Cardiology

## 2013-08-10 ENCOUNTER — Ambulatory Visit (INDEPENDENT_AMBULATORY_CARE_PROVIDER_SITE_OTHER): Payer: Medicare Other | Admitting: Cardiology

## 2013-08-10 VITALS — BP 148/60 | HR 79 | Ht 64.0 in | Wt 117.0 lb

## 2013-08-10 DIAGNOSIS — E7800 Pure hypercholesterolemia, unspecified: Secondary | ICD-10-CM | POA: Diagnosis present

## 2013-08-10 DIAGNOSIS — Z9181 History of falling: Secondary | ICD-10-CM

## 2013-08-10 DIAGNOSIS — R079 Chest pain, unspecified: Secondary | ICD-10-CM

## 2013-08-10 DIAGNOSIS — H9190 Unspecified hearing loss, unspecified ear: Secondary | ICD-10-CM | POA: Diagnosis present

## 2013-08-10 DIAGNOSIS — I480 Paroxysmal atrial fibrillation: Secondary | ICD-10-CM | POA: Diagnosis present

## 2013-08-10 DIAGNOSIS — E039 Hypothyroidism, unspecified: Secondary | ICD-10-CM | POA: Diagnosis present

## 2013-08-10 DIAGNOSIS — J449 Chronic obstructive pulmonary disease, unspecified: Secondary | ICD-10-CM | POA: Diagnosis not present

## 2013-08-10 DIAGNOSIS — E0590 Thyrotoxicosis, unspecified without thyrotoxic crisis or storm: Secondary | ICD-10-CM | POA: Diagnosis present

## 2013-08-10 DIAGNOSIS — I4891 Unspecified atrial fibrillation: Secondary | ICD-10-CM | POA: Diagnosis present

## 2013-08-10 DIAGNOSIS — E119 Type 2 diabetes mellitus without complications: Secondary | ICD-10-CM | POA: Diagnosis present

## 2013-08-10 DIAGNOSIS — Z951 Presence of aortocoronary bypass graft: Secondary | ICD-10-CM | POA: Diagnosis not present

## 2013-08-10 DIAGNOSIS — I2582 Chronic total occlusion of coronary artery: Secondary | ICD-10-CM | POA: Diagnosis present

## 2013-08-10 DIAGNOSIS — I5032 Chronic diastolic (congestive) heart failure: Secondary | ICD-10-CM | POA: Diagnosis not present

## 2013-08-10 DIAGNOSIS — R0789 Other chest pain: Secondary | ICD-10-CM | POA: Diagnosis not present

## 2013-08-10 DIAGNOSIS — I2510 Atherosclerotic heart disease of native coronary artery without angina pectoris: Secondary | ICD-10-CM | POA: Diagnosis not present

## 2013-08-10 DIAGNOSIS — D649 Anemia, unspecified: Secondary | ICD-10-CM | POA: Diagnosis present

## 2013-08-10 DIAGNOSIS — T82897A Other specified complication of cardiac prosthetic devices, implants and grafts, initial encounter: Principal | ICD-10-CM | POA: Diagnosis present

## 2013-08-10 DIAGNOSIS — I200 Unstable angina: Secondary | ICD-10-CM | POA: Insufficient documentation

## 2013-08-10 DIAGNOSIS — Z8673 Personal history of transient ischemic attack (TIA), and cerebral infarction without residual deficits: Secondary | ICD-10-CM

## 2013-08-10 DIAGNOSIS — K219 Gastro-esophageal reflux disease without esophagitis: Secondary | ICD-10-CM | POA: Diagnosis present

## 2013-08-10 DIAGNOSIS — E785 Hyperlipidemia, unspecified: Secondary | ICD-10-CM | POA: Diagnosis present

## 2013-08-10 DIAGNOSIS — Y832 Surgical operation with anastomosis, bypass or graft as the cause of abnormal reaction of the patient, or of later complication, without mention of misadventure at the time of the procedure: Secondary | ICD-10-CM | POA: Diagnosis present

## 2013-08-10 DIAGNOSIS — I509 Heart failure, unspecified: Secondary | ICD-10-CM | POA: Diagnosis present

## 2013-08-10 DIAGNOSIS — I059 Rheumatic mitral valve disease, unspecified: Secondary | ICD-10-CM | POA: Diagnosis not present

## 2013-08-10 DIAGNOSIS — G459 Transient cerebral ischemic attack, unspecified: Secondary | ICD-10-CM | POA: Diagnosis present

## 2013-08-10 DIAGNOSIS — Z7982 Long term (current) use of aspirin: Secondary | ICD-10-CM | POA: Diagnosis not present

## 2013-08-10 DIAGNOSIS — I214 Non-ST elevation (NSTEMI) myocardial infarction: Secondary | ICD-10-CM | POA: Diagnosis present

## 2013-08-10 DIAGNOSIS — Z8639 Personal history of other endocrine, nutritional and metabolic disease: Secondary | ICD-10-CM | POA: Diagnosis present

## 2013-08-10 HISTORY — PX: LEFT HEART CATHETERIZATION WITH CORONARY/GRAFT ANGIOGRAM: SHX5450

## 2013-08-10 HISTORY — DX: Personal history of transient ischemic attack (TIA), and cerebral infarction without residual deficits: Z86.73

## 2013-08-10 HISTORY — DX: Disorder of arteries and arterioles, unspecified: I77.9

## 2013-08-10 HISTORY — DX: Chronic diastolic (congestive) heart failure: I50.32

## 2013-08-10 HISTORY — DX: Peripheral vascular disease, unspecified: I73.9

## 2013-08-10 HISTORY — PX: LEFT HEART CATH: SHX5946

## 2013-08-10 HISTORY — DX: Atherosclerotic heart disease of native coronary artery without angina pectoris: I25.10

## 2013-08-10 HISTORY — DX: Hypothyroidism, unspecified: E03.9

## 2013-08-10 HISTORY — DX: Unspecified asthma, uncomplicated: J45.909

## 2013-08-10 HISTORY — DX: Anemia, unspecified: D64.9

## 2013-08-10 HISTORY — DX: Hyperlipidemia, unspecified: E78.5

## 2013-08-10 LAB — CBC WITH DIFFERENTIAL/PLATELET
Basophils Absolute: 0 10*3/uL (ref 0.0–0.1)
Basophils Relative: 0 % (ref 0–1)
Eosinophils Absolute: 0.1 10*3/uL (ref 0.0–0.7)
Eosinophils Relative: 1 % (ref 0–5)
HCT: 37.6 % (ref 36.0–46.0)
Hemoglobin: 12.8 g/dL (ref 12.0–15.0)
Lymphocytes Relative: 25 % (ref 12–46)
Lymphs Abs: 3.4 10*3/uL (ref 0.7–4.0)
MCH: 29.4 pg (ref 26.0–34.0)
MCHC: 34 g/dL (ref 30.0–36.0)
MCV: 86.2 fL (ref 78.0–100.0)
Monocytes Absolute: 1.6 10*3/uL — ABNORMAL HIGH (ref 0.1–1.0)
Monocytes Relative: 12 % (ref 3–12)
Neutro Abs: 8.6 10*3/uL — ABNORMAL HIGH (ref 1.7–7.7)
Neutrophils Relative %: 63 % (ref 43–77)
RBC: 4.36 MIL/uL (ref 3.87–5.11)
RDW: 13.5 % (ref 11.5–15.5)

## 2013-08-10 LAB — COMPREHENSIVE METABOLIC PANEL
ALT: 23 U/L (ref 0–35)
AST: 61 U/L — ABNORMAL HIGH (ref 0–37)
Albumin: 4.1 g/dL (ref 3.5–5.2)
Alkaline Phosphatase: 95 U/L (ref 39–117)
BUN: 14 mg/dL (ref 6–23)
Calcium: 9.7 mg/dL (ref 8.4–10.5)
Chloride: 96 mEq/L (ref 96–112)
Creatinine, Ser: 0.77 mg/dL (ref 0.50–1.10)
GFR calc Af Amer: 82 mL/min — ABNORMAL LOW (ref >90)
Glucose, Bld: 104 mg/dL — ABNORMAL HIGH (ref 70–99)
Sodium: 136 mEq/L (ref 135–145)
Total Bilirubin: 0.9 mg/dL (ref 0.3–1.2)
Total Protein: 7.4 g/dL (ref 6.0–8.3)

## 2013-08-10 LAB — PROTIME-INR
INR: 1.1 (ref 0.00–1.49)
Prothrombin Time: 14 seconds (ref 11.6–15.2)

## 2013-08-10 LAB — DIGOXIN LEVEL: Digoxin Level: 0.8 ng/mL (ref 0.8–2.0)

## 2013-08-10 SURGERY — LEFT HEART CATHETERIZATION WITH CORONARY/GRAFT ANGIOGRAM

## 2013-08-10 MED ORDER — ASPIRIN 81 MG PO TBEC: 81.0000 mg | DELAYED_RELEASE_TABLET | Freq: Every day | ORAL | Status: DC

## 2013-08-10 MED ORDER — NITROGLYCERIN 0.4 MG SL SUBL: 0.4000 mg | SUBLINGUAL_TABLET | SUBLINGUAL | Status: DC | PRN

## 2013-08-10 MED ORDER — SIMVASTATIN 40 MG PO TABS
40.0000 mg | ORAL_TABLET | Freq: Every day | ORAL | Status: DC
  Administered 2013-08-10 – 2013-08-11 (×2): 40 mg via ORAL
  Filled 2013-08-10 (×3): qty 1

## 2013-08-10 MED ORDER — LEVOTHYROXINE SODIUM 125 MCG PO TABS
125.0000 ug | ORAL_TABLET | Freq: Every day | ORAL | Status: DC
  Administered 2013-08-11 – 2013-08-12 (×2): 125 ug via ORAL
  Filled 2013-08-10 (×3): qty 1

## 2013-08-10 MED ORDER — AZELASTINE HCL 0.1 % NA SOLN
2.0000 | Freq: Every day | NASAL | Status: DC
  Administered 2013-08-10 – 2013-08-11 (×2): 2 via NASAL
  Filled 2013-08-10: qty 30

## 2013-08-10 MED ORDER — SODIUM CHLORIDE 0.9 % IJ SOLN
3.0000 mL | Freq: Two times a day (BID) | INTRAMUSCULAR | Status: DC
  Administered 2013-08-10: 3 mL via INTRAVENOUS

## 2013-08-10 MED ORDER — ASPIRIN 81 MG PO CHEW
324.0000 mg | CHEWABLE_TABLET | ORAL | Status: AC
  Administered 2013-08-10: 324 mg via ORAL
  Filled 2013-08-10: qty 4

## 2013-08-10 MED ORDER — ONDANSETRON HCL 4 MG/2ML IJ SOLN: 4.0000 mg | Freq: Four times a day (QID) | INTRAMUSCULAR | Status: DC | PRN

## 2013-08-10 MED ORDER — VITAMIN B-6 100 MG PO TABS
200.0000 mg | ORAL_TABLET | Freq: Every day | ORAL | Status: DC
  Administered 2013-08-10 – 2013-08-12 (×3): 200 mg via ORAL
  Filled 2013-08-10 (×3): qty 2

## 2013-08-10 MED ORDER — LIDOCAINE HCL (PF) 1 % IJ SOLN
INTRAMUSCULAR | Status: AC
  Filled 2013-08-10: qty 30

## 2013-08-10 MED ORDER — HEPARIN BOLUS VIA INFUSION
3000.0000 [IU] | Freq: Once | INTRAVENOUS | Status: AC
  Administered 2013-08-10: 3000 [IU] via INTRAVENOUS
  Filled 2013-08-10: qty 3000

## 2013-08-10 MED ORDER — FUROSEMIDE 20 MG PO TABS
20.0000 mg | ORAL_TABLET | Freq: Every day | ORAL | Status: DC
  Filled 2013-08-10: qty 0.5
  Filled 2013-08-10: qty 1

## 2013-08-10 MED ORDER — ISOSORBIDE MONONITRATE ER 30 MG PO TB24
30.0000 mg | ORAL_TABLET | Freq: Every day | ORAL | Status: DC
  Administered 2013-08-10 – 2013-08-12 (×3): 30 mg via ORAL
  Filled 2013-08-10 (×3): qty 1

## 2013-08-10 MED ORDER — AZELASTINE-FLUTICASONE 137-50 MCG/ACT NA SUSP: 2.0000 | Freq: Every day | NASAL | Status: DC

## 2013-08-10 MED ORDER — FLUTICASONE PROPIONATE 50 MCG/ACT NA SUSP
2.0000 | Freq: Every day | NASAL | Status: DC
  Administered 2013-08-10 – 2013-08-11 (×2): 2 via NASAL
  Filled 2013-08-10 (×2): qty 16

## 2013-08-10 MED ORDER — DIGOXIN 125 MCG PO TABS
0.1250 mg | ORAL_TABLET | Freq: Every day | ORAL | Status: DC
  Administered 2013-08-10 – 2013-08-12 (×3): 0.125 mg via ORAL
  Filled 2013-08-10 (×3): qty 1

## 2013-08-10 MED ORDER — HEPARIN SODIUM (PORCINE) 5000 UNIT/ML IJ SOLN
5000.0000 [IU] | Freq: Three times a day (TID) | INTRAMUSCULAR | Status: DC
  Administered 2013-08-11 – 2013-08-12 (×4): 5000 [IU] via SUBCUTANEOUS
  Filled 2013-08-10 (×7): qty 1

## 2013-08-10 MED ORDER — SODIUM CHLORIDE 0.9 % IV SOLN: 1.0000 mL/kg/h | INTRAVENOUS | Status: AC

## 2013-08-10 MED ORDER — SODIUM CHLORIDE 0.9 % IV SOLN
1.0000 mL/kg/h | INTRAVENOUS | Status: DC
  Administered 2013-08-10: 1 mL/kg/h via INTRAVENOUS

## 2013-08-10 MED ORDER — PANTOPRAZOLE SODIUM 40 MG PO TBEC
40.0000 mg | DELAYED_RELEASE_TABLET | Freq: Every day | ORAL | Status: DC
  Administered 2013-08-10 – 2013-08-12 (×3): 40 mg via ORAL
  Filled 2013-08-10 (×3): qty 1

## 2013-08-10 MED ORDER — ASPIRIN 300 MG RE SUPP
300.0000 mg | RECTAL | Status: AC
  Filled 2013-08-10: qty 1

## 2013-08-10 MED ORDER — LORATADINE 10 MG PO TABS
10.0000 mg | ORAL_TABLET | Freq: Every day | ORAL | Status: DC
  Administered 2013-08-12: 10 mg via ORAL
  Filled 2013-08-10 (×3): qty 1

## 2013-08-10 MED ORDER — ATENOLOL 12.5 MG HALF TABLET
12.5000 mg | ORAL_TABLET | Freq: Every day | ORAL | Status: DC
  Administered 2013-08-10 – 2013-08-12 (×3): 12.5 mg via ORAL
  Filled 2013-08-10 (×3): qty 1

## 2013-08-10 MED ORDER — ASPIRIN EC 81 MG PO TBEC
81.0000 mg | DELAYED_RELEASE_TABLET | Freq: Every day | ORAL | Status: DC
  Administered 2013-08-11 – 2013-08-12 (×2): 81 mg via ORAL
  Filled 2013-08-10 (×2): qty 1

## 2013-08-10 MED ORDER — ACETAMINOPHEN 325 MG PO TABS: 650.0000 mg | ORAL_TABLET | ORAL | Status: DC | PRN

## 2013-08-10 MED ORDER — SODIUM CHLORIDE 0.9 % IJ SOLN: 3.0000 mL | INTRAMUSCULAR | Status: DC | PRN

## 2013-08-10 MED ORDER — NITROGLYCERIN 0.2 MG/ML ON CALL CATH LAB
INTRAVENOUS | Status: AC
  Filled 2013-08-10: qty 1

## 2013-08-10 MED ORDER — SODIUM CHLORIDE 0.9 % IV SOLN: 250.0000 mL | INTRAVENOUS | Status: DC | PRN

## 2013-08-10 MED ORDER — HEPARIN SODIUM (PORCINE) 5000 UNIT/ML IJ SOLN: 5000.0000 [IU] | Freq: Three times a day (TID) | INTRAMUSCULAR | Status: DC

## 2013-08-10 MED ORDER — HEPARIN (PORCINE) IN NACL 2-0.9 UNIT/ML-% IJ SOLN
INTRAMUSCULAR | Status: AC
  Filled 2013-08-10: qty 1500

## 2013-08-10 MED ORDER — HEPARIN (PORCINE) IN NACL 100-0.45 UNIT/ML-% IJ SOLN
600.0000 [IU]/h | INTRAMUSCULAR | Status: DC
  Administered 2013-08-10: 600 [IU]/h via INTRAVENOUS
  Filled 2013-08-10: qty 250

## 2013-08-10 MED ORDER — ISOSORBIDE MONONITRATE ER 30 MG PO TB24
30.0000 mg | ORAL_TABLET | Freq: Every day | ORAL | Status: DC
  Filled 2013-08-10: qty 1

## 2013-08-10 NOTE — Telephone Encounter (Signed)
New Problem:   Pt states she had severe chest pain throughout the night and she took 4 nitro and 2 aspirin. Pt states the chest pain isn't as bad as it was but she still does not feel right. Pt states her right arm/wrist has been hurting.

## 2013-08-10 NOTE — Progress Notes (Signed)
Jill Howell Date of Birth:  06/23/1919 [address] [place], [place]  [address] [phone]         Fax   [phone]  History of Present Illness: This pleasant 79-year-old woman is seen for a urgent work in office visit. She has a history of ischemic heart disease. She had coronary artery bypass graft surgery in 2000. She has a history of diastolic congestive heart failure and high cholesterol. She has had GERD and is on Protonix. She has had a history of hypothyroidism. She has a known left carotid bruit.  She had successful left carotid endarterectomy on 05/19/2013 by Dr. Fields.   She has had no subsequent TIA symptoms. She states that she is a diabetic although her blood sugars here in the office have been in the range of 100-102. She watches her diet. She is on no diabetic medicine.  She comes in today because of crescendo angina.  6 days ago she began experiencing chest tightness relieved by nitroglycerin.  Since then the episodes have become more frequent.  Last night she had 4 separate episodes of chest discomfort each of them relieved by a single nitroglycerin.  She also gets right wrist pain relieved by sublingual nitroglycerin.  She has not had any nausea or sweating..  Current Outpatient Prescriptions  Medication Sig Dispense Refill  . aspirin 81 MG EC tablet Take 81 mg by mouth daily.        . atenolol (TENORMIN) 25 MG tablet Take 12.5 mg by mouth daily.       . Azelastine-Fluticasone (DYMISTA) 137-50 MCG/ACT SUSP Place 2 sprays into both nostrils at bedtime.  1 Bottle  0  . digoxin (LANOXIN) 0.125 MG tablet Take 1 tablet (0.125 mg total) by mouth daily.  90 tablet  3  . fexofenadine (ALLEGRA) 180 MG tablet Take 180 mg by mouth daily as needed (for allergies).      . furosemide (LASIX) 40 MG tablet Take 40 mg by mouth as directed. 1/2 tablet daily      . levothyroxine (SYNTHROID) 125 MCG tablet Take 1 tablet (125 mcg total) by mouth daily.  90 tablet  3  .  Multiple Vitamins-Minerals (CENTRUM SILVER PO) Take 1 tablet by mouth daily.       . Multiple Vitamins-Minerals (PRESERVISION AREDS PO) Take 1 tablet by mouth daily.      . nitroGLYCERIN (NITROSTAT) 0.4 MG SL tablet Place 1 tablet (0.4 mg total) under the tongue every 5 (five) minutes as needed for chest pain.  100 tablet  prn  . pantoprazole (PROTONIX) 40 MG tablet Take 1 tablet (40 mg total) by mouth daily.  90 tablet  3  . pyridoxine (B-6) 200 MG tablet Take 200 mg by mouth daily.        . simvastatin (ZOCOR) 40 MG tablet TAKE 1 TABLET (40 MG TOTAL) BY MOUTH EVERY EVENING.  90 tablet  0   No current facility-administered medications for this visit.    Allergies  Allergen Reactions  . Penicillins     REACTION: ?    Patient Active Problem List   Diagnosis Date Noted  . TIA (transient ischemic attack) 05/17/2013  . Occlusion and stenosis of carotid artery without mention of cerebral infarction 05/13/2013  . Fall at home 05/11/2013  . Leg pain 08/11/2012  . PAF (paroxysmal atrial fibrillation) 08/11/2012  . Back pain 05/07/2012  . Carotid artery disease 08/19/2011  . Dermatitis 05/09/2011  . UTI (lower urinary tract infection) 03/21/2011  .   HYPOTHYROIDISM 09/15/2007  . DIABETES MELLITUS, TYPE II 09/15/2007  . HYPERLIPIDEMIA 09/15/2007  . HEARING IMPAIRMENT 09/15/2007  . CORONARY ARTERY DISEASE 09/15/2007  . CONGESTIVE HEART FAILURE 09/15/2007  . ALLERGIC RHINITIS 09/15/2007  . Unspecified asthma(493.90) 09/15/2007  . GERD 09/15/2007    History  Smoking status  . Never Smoker   Smokeless tobacco  . Never Used    History  Alcohol Use No    Family History  Problem Relation Age of Onset  . Heart attack Father   . Heart disease Son     before age 60    Review of Systems: Constitutional: no fever chills diaphoresis or fatigue or change in weight.  Head and neck: no hearing loss, no epistaxis, no photophobia or visual disturbance. Respiratory: No cough, shortness of  breath or wheezing. Cardiovascular: No chest pain peripheral edema, palpitations. Gastrointestinal: No abdominal distention, no abdominal pain, no change in bowel habits hematochezia or melena. Genitourinary: No dysuria, no frequency, no urgency, no nocturia. Musculoskeletal:No arthralgias, no back pain, no gait disturbance or myalgias. Neurological: No dizziness, no headaches, no numbness, no seizures, no syncope, no weakness, no tremors. Hematologic: No lymphadenopathy, no easy bruising. Psychiatric: No confusion, no hallucinations, no sleep disturbance.    Physical Exam: Filed Vitals:   08/10/13 1044  BP: 148/60  Pulse: 79   the general appearance reveals a thin elderly alert woman in no distress.The head and neck exam reveals pupils equal and reactive.  Extraocular movements are full.  There is no scleral icterus.  The mouth and pharynx are normal.  The neck is supple.  The carotids reveal no bruits.  The jugular venous pressure is normal.  The  thyroid is not enlarged.  There is no lymphadenopathy.  The chest is clear to percussion and auscultation.  There are no rales or rhonchi.  Expansion of the chest is symmetrical.  The precordium is quiet.  The first heart sound is normal.  The second heart sound is physiologically split.  There is no murmur gallop rub or click.  There is no abnormal lift or heave.  The abdomen is soft and nontender.  The bowel sounds are normal.  The liver and spleen are not enlarged.  There are no abdominal masses.  There are no abdominal bruits.  Extremities reveal good pedal pulses.  There is no phlebitis or edema.  There is no cyanosis or clubbing.  Strength is normal and symmetrical in all extremities.  There is no lateralizing weakness.  There are no sensory deficits.  The skin is warm and dry.  There is no rash.     Assessment / Plan: The patient is experiencing signs and symptoms consistent with unstable crescendo angina.  Her EKG today shows new ST segment  depression in the inferolateral leads suggestive of ischemia.  We are going to admit her to [facility].  I would anticipate that she would need cardiac catheterization in the near future.     

## 2013-08-10 NOTE — H&P (Signed)
Patient ID: Jill Howell MRN: [id number], DOB/AGE: 1919/02/19   Admit date: 08/10/2013   Primary Physician: Crawford Givens, MD Primary Cardiologist: Cassell Clement  Pt. Profile:  This is a 79 year old woman from [facility].  Problem List  Past Medical History  Diagnosis Date  . Acute upper respiratory infections of unspecified site   . Unspecified hearing loss   . Unspecified hypothyroidism   . Other and unspecified hyperlipidemia   . Esophageal reflux   . Type II or unspecified type diabetes mellitus without mention of complication, not stated as uncontrolled   . Coronary atherosclerosis of unspecified type of vessel, native or graft     s/p CABG 2000  . MI (myocardial infarction) 2000; 2011  . Congestive heart failure, unspecified   . Atrial fibrillation   . Unspecified asthma(493.90)   . Allergic rhinitis, cause unspecified   . Hypertension     Past Surgical History  Procedure Laterality Date  . Tonsillectomy    . Partial hysterectomy    . Dilation and curettage of uterus    . Bladder tack      x 2  . Rectocele repair      x 2  . Cholecystectomy  10/2001  . Rotator cuff repair    . Coronary artery bypass graft  2000    4 vessel  . Hemorrhoid surgery    . Cardiac catheterization  09/07/1999    EF 25%. THERE IS SEVERE MITRAL ANNULAR CALIFICATION, WITH  3 + INSUFFICIENCY  . US echocardiography  01/17/2005    EF 55-60%  . Cardiovascular stress test  06/30/2007    EF 63%, NO ISCHEMIA  . Eye surgery Bilateral     Cataract  . Endarterectomy Left 05/19/2013    Procedure: ENDARTERECTOMY CAROTID-LEFT;  Surgeon: Sherren Kerns, MD;  Location: [facility] OR;  Service: Vascular;  Laterality: Left;     Allergies  Allergies  Allergen Reactions  . Penicillins     REACTION: ?    HPI This pleasant 79 year old woman is seen for a urgent work in office visit. She has a history of ischemic heart disease. She had coronary artery bypass graft surgery in 2000.  She has a history of diastolic congestive heart failure and high cholesterol. She has had GERD and is on Protonix. She has had a history of hypothyroidism. She has a known left carotid bruit. She had successful left carotid endarterectomy on 05/19/2013 by Dr. Darrick Penna. She has had no subsequent TIA symptoms. She states that she is a diabetic although her blood sugars here in the office have been in the range of 100-102. She watches her diet. She is on no diabetic medicine.  She comes in today because of crescendo angina. 6 days ago she began experiencing chest tightness relieved by nitroglycerin. Since then the episodes have become more frequent. Last night she had 4 separate episodes of chest discomfort each of them relieved by a single nitroglycerin. She also gets right wrist pain relieved by sublingual nitroglycerin. She has not had any nausea or sweating   Home Medications  Prior to Admission medications   Medication Sig Start Date End Date Taking? Authorizing Provider  Ascorbic Acid (VITAMIN C) 1000 MG tablet Take 500 mg by mouth daily.   Yes Historical Provider, MD  aspirin 81 MG EC tablet Take 81 mg by mouth daily.     Yes Historical Provider, MD  atenolol (TENORMIN) 25 MG tablet Take 12.5 mg by mouth daily.  12/09/12  Yes  Cassell Clement, MD  digoxin (LANOXIN) 0.125 MG tablet Take 1 tablet (0.125 mg total) by mouth daily. 12/09/12  Yes Cassell Clement, MD  furosemide (LASIX) 40 MG tablet Take 20 mg by mouth daily.   Yes Historical Provider, MD  levothyroxine (SYNTHROID) 125 MCG tablet Take 1 tablet (125 mcg total) by mouth daily. 10/21/12  Yes Cassell Clement, MD  Multiple Vitamins-Minerals (PRESERVISION AREDS PO) Take 1 tablet by mouth daily.   Yes Historical Provider, MD  Multiple Vitamins-Minerals (PRESERVISION AREDS PO) Take 1 tablet by mouth 2 (two) times daily.   Yes Historical Provider, MD  nitroGLYCERIN (NITROSTAT) 0.4 MG SL tablet Place 1 tablet (0.4 mg total) under the tongue every 5  (five) minutes as needed for chest pain. 12/09/12  Yes Cassell Clement, MD  pantoprazole (PROTONIX) 40 MG tablet Take 1 tablet (40 mg total) by mouth daily. 02/10/13  Yes Cassell Clement, MD  pyridoxine (B-6) 200 MG tablet Take 200 mg by mouth daily.     Yes Historical Provider, MD  simvastatin (ZOCOR) 40 MG tablet Take 40 mg by mouth every evening.   Yes Historical Provider, MD    Family History  Family History  Problem Relation Age of Onset  . Heart attack Father   . Heart disease Son     before age 53    Social History  History   Social History  . Marital Status: Widowed    Spouse Name: N/A    Number of Children: 4  . Years of Education: N/A   Occupational History  . Retired    Social History Main Topics  . Smoking status: Never Smoker   . Smokeless tobacco: Never Used  . Alcohol Use: No  . Drug Use: No  . Sexual Activity: Not on file   Other Topics Concern  . Not on file   Social History Narrative   Widowed      4 sons      Retired     Review of Systems General:  No chills, fever, night sweats or weight changes.  Cardiovascular:  Recent chest pain and a right wrist pain relieved by sublingual nitroglycerin dyspnea on exertion, edema, orthopnea, palpitations, paroxysmal nocturnal dyspnea. Dermatological: No rash, lesions/masses Respiratory: No cough, dyspnea Urologic: No hematuria, dysuria Abdominal:   No nausea, vomiting, diarrhea, bright red blood per rectum, melena, or hematemesis Neurologic:  No visual changes, wkns, changes in mental status. All other systems reviewed and are otherwise negative except as noted above.  Physical Exam  Blood pressure 152/62, pulse 78, temperature 97.5 F (36.4 C), temperature source Oral, resp. rate 17, height 5\' 4"  (1.626 m), weight 114 lb 9.6 oz (51.982 kg), SpO2 98.00%.  General: Pleasant, NAD Psych: Normal affect. Neuro: Alert and oriented X 3. Moves all extremities spontaneously. HEENT: Normal  Neck: Supple  without bruits or JVD. Lungs:  Resp regular and unlabored, CTA. Heart: RRR no s3, s4, or murmurs. Abdomen: Soft, non-tender, non-distended, BS + x 4.  Extremities: No clubbing, cyanosis or edema. DP/PT/Radials 2+ and equal bilaterally.  Labs  Troponin [facility] of Care Test) No results found for this basename: TROPIPOC,  in the last 72 hours  Recent Labs  08/10/13 1440  TROPONINI 5.17*   Lab Results  Component Value Date   WBC 13.8* 08/10/2013   HGB 12.8 08/10/2013   HCT 37.6 08/10/2013   MCV 86.2 08/10/2013   PLT 194 08/10/2013     Recent Labs Lab 08/10/13 1440  NA 136  K 4.4  CL 96  CO2 28  BUN 14  CREATININE 0.77  CALCIUM 9.7  PROT 7.4  BILITOT 0.9  ALKPHOS 95  ALT 23  AST 61*  GLUCOSE 104*   Lab Results  Component Value Date   CHOL 162 04/13/2013   HDL 33.60* 04/13/2013   LDLCALC 63 08/19/2011   TRIG 400.0 Triglyceride is over 400; calculations on Lipids are invalid.* 04/13/2013   No results found for this basename: DDIMER     Radiology/Studies  No results found.  ECG EKG shows normal sinus rhythm and widespread ST segment depression consistent with inferolateral ischemia  ASSESSMENT AND PLAN 1.  Ischemic heart disease status post ABG in 2000, now presents with unstable angina pectoris with intermittent chest pain onset 6 days ago, and for episodes of pain last evening.  EKG changes today are new. 2.  status post successful left carotid endarterectomy September 2014 by Dr. Darrick Penna, with no subsequent TIA symptoms. 3. diabetes mellitus adult onset  Type II 4. hypercholesterolemia  Plan: The patient is being admitted to [facility] for cardiac catheterization this afternoon.  Signed, Cassell Clement, MD  08/10/2013, 5:30 PM

## 2013-08-10 NOTE — Progress Notes (Signed)
Quick Note:  Please report to patient. The recent labs are stable. Continue same medication and careful diet. Triglycerides are higher. Watch sweets. ______

## 2013-08-10 NOTE — Progress Notes (Signed)
ANTICOAGULATION CONSULT NOTE - Initial Consult  Pharmacy Consult for Heparin Indication: chest pain/ACS  Allergies  Allergen Reactions  . Penicillins     REACTION: ?    Patient Measurements: Height: 5\' 4"  (162.6 cm) Weight: 114 lb 9.6 oz (51.982 kg) IBW/kg (Calculated) : 54.7 Heparin Dosing Weight: 52 kg  Vital Signs: Temp: 97.5 F (36.4 C) (12/02 1205) Temp src: Oral (12/02 1205) BP: 165/63 mmHg (12/02 1205) Pulse Rate: 101 (12/02 1205)  Labs: No results found for this basename: HGB, HCT, PLT, APTT, LABPROT, INR, HEPARINUNFRC, CREATININE, CKTOTAL, CKMB, TROPONINI,  in the last 72 hours  Estimated Creatinine Clearance: 35.5 ml/min (by C-G formula based on Cr of 0.83).   Medical History: Past Medical History  Diagnosis Date  . Acute upper respiratory infections of unspecified site   . Unspecified hearing loss   . Unspecified hypothyroidism   . Other and unspecified hyperlipidemia   . Esophageal reflux   . Type II or unspecified type diabetes mellitus without mention of complication, not stated as uncontrolled   . Coronary atherosclerosis of unspecified type of vessel, native or graft     s/p CABG 2000  . MI (myocardial infarction) 2000; 2011  . Congestive heart failure, unspecified   . Atrial fibrillation   . Unspecified asthma(493.90)   . Allergic rhinitis, cause unspecified   . Hypertension     Medications:  Prescriptions prior to admission  Medication Sig Dispense Refill  . aspirin 81 MG EC tablet Take 81 mg by mouth daily.        [name] atenolol (TENORMIN) 25 MG tablet Take 12.5 mg by mouth daily.       . Azelastine-Fluticasone (DYMISTA) 137-50 MCG/ACT SUSP Place 2 sprays into both nostrils at bedtime.  1 Bottle  0  . digoxin (LANOXIN) 0.125 MG tablet Take 1 tablet (0.125 mg total) by mouth daily.  90 tablet  3  . fexofenadine (ALLEGRA) 180 MG tablet Take 180 mg by mouth daily as needed (for allergies).      . furosemide (LASIX) 40 MG tablet Take 40 mg by mouth  as directed. 1/2 tablet daily      . levothyroxine (SYNTHROID) 125 MCG tablet Take 1 tablet (125 mcg total) by mouth daily.  90 tablet  3  . Multiple Vitamins-Minerals (CENTRUM SILVER PO) Take 1 tablet by mouth daily.       . Multiple Vitamins-Minerals (PRESERVISION AREDS PO) Take 1 tablet by mouth daily.      . nitroGLYCERIN (NITROSTAT) 0.4 MG SL tablet Place 1 tablet (0.4 mg total) under the tongue every 5 (five) minutes as needed for chest pain.  100 tablet  prn  . pantoprazole (PROTONIX) 40 MG tablet Take 1 tablet (40 mg total) by mouth daily.  90 tablet  3  . pyridoxine (B-6) 200 MG tablet Take 200 mg by mouth daily.        . simvastatin (ZOCOR) 40 MG tablet TAKE 1 TABLET (40 MG TOTAL) BY MOUTH EVERY EVENING.  90 tablet  0    Assessment: Pt presents from MD office, after experiencing chest pain for the past 6 days - relieved by nitroglycerin. Pt is s/p CABG in 2000 and has h/o diastolic CHF, HLD. Pt's most recent labs from 05/2013 show mild anemia, hgb: 9.6, platelets WNL, current inpatient labs are pending.  No signs or symptoms of bleeding noted from office visit today.  Pt is not on anticoagulant PTA.  Goal of Therapy:  Heparin level 0.3-0.7 units/ml Monitor platelets by  anticoagulation protocol: Yes   Plan:  Bolus: Heparin 3000 units x1 Infusion: Heparin 600 units/hr Check heparin level in 8 hours Daily heparin level and CBC Will follow up on baseline labs   Agapito Games, PharmD, BCPS 1:49 PM 08/10/13

## 2013-08-10 NOTE — CV Procedure (Signed)
   Cardiac Catheterization Procedure Note  Name: Jill Howell MRN: [id number] DOB: June 12, 1919  Procedure: Left Heart Cath, Selective Coronary Angiography, SVG angiography, LIMA angiography, LV angiography  Indication: 79 yo s/p CABG in 2000 presents with a NSTEMI  Procedural details: The right groin was prepped, draped, and anesthetized with 1% lidocaine. Using modified Seldinger technique, a 5 French sheath was introduced into the right femoral artery. Standard Judkins catheters were used for coronary angiography and left ventriculography. Catheter exchanges were performed over a guidewire. There were no immediate procedural complications. The patient was transferred to the post catheterization recovery area for further monitoring.  Procedural Findings: Hemodynamics:  AO 145/63 mean 99 mm Hg LV 144/24 mm Hg   Coronary angiography: Coronary dominance: right  Left mainstem: 90-95% ostial, 90-95% distal. Heavily calcified.  Left anterior descending (LAD): Diffuse 90% proximal, 100% after the first diagonal.  Left circumflex (LCx): 99% proximal. 100% after small OM1.  Right coronary artery (RCA): 100% ostial occlusion.  SVG to the PDA is widely patent and supplies the entire dominant RCA.  SVG to the OM is occluded in the body of the graft with large thrombus burden.  SVG to the diagonal is widely patent.There are faint left to left collaterals to the OM.  LIMA to the LAD is widely patent.  Left ventriculography: Left ventricular systolic function is overall normal, LVEF is estimated at 55-60%, there is mild inferior hypokinesis. There is severe mitral annular calcification with moderate 2+ mitral regurgitation   Final Conclusions:   1. Critical left main and 3 vessel coronary artery disease. 2. Patent LIMA to the LAD 3. Patent SVG to the diagonal. 4. Patent SVG to the PDA 5. Occluded SVG to the OM. 6. Good LV function 7. Moderate 2+ mitral insufficiency.  Recommendations:  The culprit vessel is the SVG to the OM which is occluded with a large thrombus burden. Her other grafts look excellent and LV function is well preserved. She is currently pain free. I recommend medical management.  Theron Arista [facility] 08/10/2013, 4:59 PM

## 2013-08-10 NOTE — Telephone Encounter (Signed)
Spoke with patient and she does not want to go to the ED. Discussed with  Dr. Patty Sermons and will work her in this am, advised patient

## 2013-08-10 NOTE — Interval H&P Note (Signed)
History and Physical Interval Note:  08/10/2013 4:17 PM  Jill Howell  has presented today for surgery, with the diagnosis of Chest pain  The various methods of treatment have been discussed with the patient and family. After consideration of risks, benefits and other options for treatment, the patient has consented to  Procedure(s): LEFT HEART CATHETERIZATION WITH CORONARY/GRAFT ANGIOGRAM as a surgical intervention .  The patient's history has been reviewed, patient examined, no change in status, stable for surgery.  I have reviewed the patient's chart and labs.  Questions were answered to the patient's satisfaction.   Cath Lab Visit (complete for each Cath Lab visit)  Clinical Evaluation Leading to the Procedure:   ACS: yes  Non-ACS:    Anginal Classification: CCS III  Anti-ischemic medical therapy: Minimal Therapy (1 class of medications)  Non-Invasive Test Results: No non-invasive testing performed  Prior CABG: Previous CABG        Theron Arista [facility] 08/10/2013 4:17 PM

## 2013-08-10 NOTE — Progress Notes (Signed)
CRITICAL VALUE ALERT  Critical value received:  TROPONIN  Date of notification:  08/10/2013  Time of notification: 1620 Critical value read back: YES  Nurse who received alert:  Harrie Foreman, RN  MD notified (1st page):  Eduardo Osier, RN was transporting patient to cath lab when critical lab value was called to floor.  Eduardo Osier, RN notified cath lab of positive troponin.  Time of first page:  MD notified (2nd page):  Time of second page:  Responding MD:    Time MD responded:

## 2013-08-10 NOTE — Telephone Encounter (Signed)
Patient called to report she is coming to see Dr. Patty Sermons because she had left sided chest pain much of the night. Currently she is asymptomatic.   Overnight pt took 4 NTG, 4 baby ASA, slept from 2am-4am and woke up with no further chest pain. Denies being SOB with chest pain. States she took blood pressures but thinks her machine wasn't functioning properly. Instructed pt to go to ED via ambulance but pt refused.  Her son is on way from [facility] to drive her to the hospital. Advised pt against this. Spoke with Dr Yevonne Pax nurse, Juliette Alcide, who then spoke with patient.

## 2013-08-10 NOTE — H&P (View-Only) (Signed)
Jill Howell Date of Birth:  03-29-1919 [address] [place], [place]  [address] [phone]         Fax   [phone]  History of Present Illness: This pleasant 79 year old woman is seen for a urgent work in office visit. She has a history of ischemic heart disease. She had coronary artery bypass graft surgery in 2000. She has a history of diastolic congestive heart failure and high cholesterol. She has had GERD and is on Protonix. She has had a history of hypothyroidism. She has a known left carotid bruit.  She had successful left carotid endarterectomy on 05/19/2013 by Dr. Darrick Penna.   She has had no subsequent TIA symptoms. She states that she is a diabetic although her blood sugars here in the office have been in the range of 100-102. She watches her diet. She is on no diabetic medicine.  She comes in today because of crescendo angina.  6 days ago she began experiencing chest tightness relieved by nitroglycerin.  Since then the episodes have become more frequent.  Last night she had 4 separate episodes of chest discomfort each of them relieved by a single nitroglycerin.  She also gets right wrist pain relieved by sublingual nitroglycerin.  She has not had any nausea or sweating..  Current Outpatient Prescriptions  Medication Sig Dispense Refill  . aspirin 81 MG EC tablet Take 81 mg by mouth daily.        [name] atenolol (TENORMIN) 25 MG tablet Take 12.5 mg by mouth daily.       . Azelastine-Fluticasone (DYMISTA) 137-50 MCG/ACT SUSP Place 2 sprays into both nostrils at bedtime.  1 Bottle  0  . digoxin (LANOXIN) 0.125 MG tablet Take 1 tablet (0.125 mg total) by mouth daily.  90 tablet  3  . fexofenadine (ALLEGRA) 180 MG tablet Take 180 mg by mouth daily as needed (for allergies).      . furosemide (LASIX) 40 MG tablet Take 40 mg by mouth as directed. 1/2 tablet daily      . levothyroxine (SYNTHROID) 125 MCG tablet Take 1 tablet (125 mcg total) by mouth daily.  90 tablet  3  .  Multiple Vitamins-Minerals (CENTRUM SILVER PO) Take 1 tablet by mouth daily.       . Multiple Vitamins-Minerals (PRESERVISION AREDS PO) Take 1 tablet by mouth daily.      . nitroGLYCERIN (NITROSTAT) 0.4 MG SL tablet Place 1 tablet (0.4 mg total) under the tongue every 5 (five) minutes as needed for chest pain.  100 tablet  prn  . pantoprazole (PROTONIX) 40 MG tablet Take 1 tablet (40 mg total) by mouth daily.  90 tablet  3  . pyridoxine (B-6) 200 MG tablet Take 200 mg by mouth daily.        . simvastatin (ZOCOR) 40 MG tablet TAKE 1 TABLET (40 MG TOTAL) BY MOUTH EVERY EVENING.  90 tablet  0   No current facility-administered medications for this visit.    Allergies  Allergen Reactions  . Penicillins     REACTION: ?    Patient Active Problem List   Diagnosis Date Noted  . TIA (transient ischemic attack) 05/17/2013  . Occlusion and stenosis of carotid artery without mention of cerebral infarction 05/13/2013  . Fall at home 05/11/2013  . Leg pain 08/11/2012  . PAF (paroxysmal atrial fibrillation) 08/11/2012  . Back pain 05/07/2012  . Carotid artery disease 08/19/2011  . Dermatitis 05/09/2011  . UTI (lower urinary tract infection) 03/21/2011  .  HYPOTHYROIDISM 09/15/2007  . DIABETES MELLITUS, TYPE II 09/15/2007  . HYPERLIPIDEMIA 09/15/2007  . HEARING IMPAIRMENT 09/15/2007  . CORONARY ARTERY DISEASE 09/15/2007  . CONGESTIVE HEART FAILURE 09/15/2007  . ALLERGIC RHINITIS 09/15/2007  . Unspecified asthma(493.90) 09/15/2007  . GERD 09/15/2007    History  Smoking status  . Never Smoker   Smokeless tobacco  . Never Used    History  Alcohol Use No    Family History  Problem Relation Age of Onset  . Heart attack Father   . Heart disease Son     before age 75    Review of Systems: Constitutional: no fever chills diaphoresis or fatigue or change in weight.  Head and neck: no hearing loss, no epistaxis, no photophobia or visual disturbance. Respiratory: No cough, shortness of  breath or wheezing. Cardiovascular: No chest pain peripheral edema, palpitations. Gastrointestinal: No abdominal distention, no abdominal pain, no change in bowel habits hematochezia or melena. Genitourinary: No dysuria, no frequency, no urgency, no nocturia. Musculoskeletal:No arthralgias, no back pain, no gait disturbance or myalgias. Neurological: No dizziness, no headaches, no numbness, no seizures, no syncope, no weakness, no tremors. Hematologic: No lymphadenopathy, no easy bruising. Psychiatric: No confusion, no hallucinations, no sleep disturbance.    Physical Exam: Filed Vitals:   08/10/13 1044  BP: 148/60  Pulse: 79   the general appearance reveals a thin elderly alert woman in no distress.The head and neck exam reveals pupils equal and reactive.  Extraocular movements are full.  There is no scleral icterus.  The mouth and pharynx are normal.  The neck is supple.  The carotids reveal no bruits.  The jugular venous pressure is normal.  The  thyroid is not enlarged.  There is no lymphadenopathy.  The chest is clear to percussion and auscultation.  There are no rales or rhonchi.  Expansion of the chest is symmetrical.  The precordium is quiet.  The first heart sound is normal.  The second heart sound is physiologically split.  There is no murmur gallop rub or click.  There is no abnormal lift or heave.  The abdomen is soft and nontender.  The bowel sounds are normal.  The liver and spleen are not enlarged.  There are no abdominal masses.  There are no abdominal bruits.  Extremities reveal good pedal pulses.  There is no phlebitis or edema.  There is no cyanosis or clubbing.  Strength is normal and symmetrical in all extremities.  There is no lateralizing weakness.  There are no sensory deficits.  The skin is warm and dry.  There is no rash.     Assessment / Plan: The patient is experiencing signs and symptoms consistent with unstable crescendo angina.  Her EKG today shows new ST segment  depression in the inferolateral leads suggestive of ischemia.  We are going to admit her to [facility].  I would anticipate that she would need cardiac catheterization in the near future.

## 2013-08-11 ENCOUNTER — Inpatient Hospital Stay (HOSPITAL_COMMUNITY): Payer: Medicare Other

## 2013-08-11 DIAGNOSIS — I214 Non-ST elevation (NSTEMI) myocardial infarction: Secondary | ICD-10-CM | POA: Diagnosis not present

## 2013-08-11 DIAGNOSIS — T82897A Other specified complication of cardiac prosthetic devices, implants and grafts, initial encounter: Secondary | ICD-10-CM | POA: Diagnosis not present

## 2013-08-11 DIAGNOSIS — J449 Chronic obstructive pulmonary disease, unspecified: Secondary | ICD-10-CM | POA: Diagnosis not present

## 2013-08-11 DIAGNOSIS — I059 Rheumatic mitral valve disease, unspecified: Secondary | ICD-10-CM

## 2013-08-11 DIAGNOSIS — I5032 Chronic diastolic (congestive) heart failure: Secondary | ICD-10-CM | POA: Diagnosis not present

## 2013-08-11 DIAGNOSIS — I509 Heart failure, unspecified: Secondary | ICD-10-CM | POA: Diagnosis not present

## 2013-08-11 LAB — CBC
Hemoglobin: 11.2 g/dL — ABNORMAL LOW (ref 12.0–15.0)
MCH: 28.7 pg (ref 26.0–34.0)
MCHC: 33.3 g/dL (ref 30.0–36.0)
MCV: 86.2 fL (ref 78.0–100.0)
Platelets: 182 10*3/uL (ref 150–400)
RBC: 3.9 MIL/uL (ref 3.87–5.11)
WBC: 9.1 10*3/uL (ref 4.0–10.5)

## 2013-08-11 LAB — LIPID PANEL
LDL Cholesterol: 79 mg/dL (ref 0–99)
VLDL: 36 mg/dL (ref 0–40)

## 2013-08-11 LAB — BASIC METABOLIC PANEL
BUN: 16 mg/dL (ref 6–23)
CO2: 26 mEq/L (ref 19–32)
Calcium: 8.9 mg/dL (ref 8.4–10.5)
Chloride: 101 mEq/L (ref 96–112)
Potassium: 4 mEq/L (ref 3.5–5.1)
Sodium: 137 mEq/L (ref 135–145)

## 2013-08-11 LAB — TROPONIN I: Troponin I: 3.25 ng/mL (ref <0.30)

## 2013-08-11 NOTE — Progress Notes (Signed)
CARDIAC REHAB PHASE I   PRE:  Rate/Rhythm: 68SR  BP:  Supine:   Sitting: 104/40  Standing:    SaO2: 97%RA  MODE:  Ambulation: 500 ft   POST:  Rate/Rhythm: 79  BP:  Supine:   Sitting: 110/40  Standing:    SaO2: 95%RA 1015-1045 Pt walked about 200 ft on RA holding our hands and stated she would walk better with her shoes. Went to room and put her shoes on for her to walk another 300 ft . Pt still wobbly. When asked if pt had family members who could come and stay with her a few days she said her sons were busy.  Talked with case manager about PT eval with possible home safety and/or home PT. Pt stated she has fallen at home. To recliner with call  Bell. Encouraged walks with staff today. Pt routinely does not use a cane or walker but feel she would be steadier with walker. Discussed with pt not getting up unless with staff. Denied CP.   Jill Nutting, RN BSN  08/11/2013 10:41 AM

## 2013-08-11 NOTE — Progress Notes (Signed)
Echocardiogram 2D Echocardiogram has been performed.  Jill Howell 08/11/2013, 1:19 PM

## 2013-08-11 NOTE — Progress Notes (Signed)
Utilization review completed.  

## 2013-08-11 NOTE — Progress Notes (Signed)
Patient Name: Jill Howell Date of Encounter: 08/11/2013     Active Problems:   Unstable angina   NSTEMI (non-ST elevated myocardial infarction)    SUBJECTIVE The patient is not having any chest discomfort this morning.  Denies dyspnea.  Rhythm is normal sinus rhythm with occasional PVC.  Blood pressure is soft this morning.  CURRENT MEDS . aspirin EC  81 mg Oral Daily  . atenolol  12.5 mg Oral Daily  . azelastine  2 spray Each Nare QHS   And  . fluticasone  2 spray Each Nare QHS  . digoxin  0.125 mg Oral Daily  . furosemide  20 mg Oral Daily  . heparin  5,000 Units Subcutaneous Q8H  . isosorbide mononitrate  30 mg Oral Daily  . levothyroxine  125 mcg Oral QAC breakfast  . loratadine  10 mg Oral Daily  . pantoprazole  40 mg Oral Daily  . pyridoxine  200 mg Oral Daily  . simvastatin  40 mg Oral q1800    OBJECTIVE  Filed Vitals:   08/10/13 2000 08/10/13 2100 08/10/13 2200 08/11/13 0532  BP: 133/63 131/65 131/65 94/42  Pulse:   80 71  Temp:   97.9 F (36.6 C) 97.9 F (36.6 C)  TempSrc:   Oral Oral  Resp:   18 18  Height:      Weight:    114 lb 4.7 oz (51.844 kg)  SpO2:   97% 96%    Intake/Output Summary (Last 24 hours) at 08/11/13 0812 Last data filed at 08/11/13 0600  Gross per 24 hour  Intake    200 ml  Output    400 ml  Net   -200 ml   Filed Weights   08/10/13 1205 08/11/13 0532  Weight: 114 lb 9.6 oz (51.982 kg) 114 lb 4.7 oz (51.844 kg)    PHYSICAL EXAM  General: Pleasant, NAD. Neuro: Alert and oriented X 3. Moves all extremities spontaneously. Psych: Normal affect. HEENT:  Normal  Neck: Supple.  Soft left carotid bruit Lungs:  Resp regular and unlabored, CTA. Heart: RRR no s3, s4, or murmurs. Abdomen: Soft, non-tender, non-distended, BS + x 4.  The groin reveals no ecchymosis or hematoma. Extremities: No clubbing, cyanosis or edema. DP/PT/Radials 2+ and equal bilaterally.  Accessory Clinical Findings  CBC  Recent Labs  08/10/13 1440  08/11/13 0428  WBC 13.8* 9.1  NEUTROABS 8.6*  --   HGB 12.8 11.2*  HCT 37.6 33.6*  MCV 86.2 86.2  PLT 194 182   Basic Metabolic Panel  Recent Labs  08/10/13 1440 08/11/13 0428  NA 136 137  K 4.4 4.0  CL 96 101  CO2 28 26  GLUCOSE 104* 99  BUN 14 16  CREATININE 0.77 0.84  CALCIUM 9.7 8.9   Liver Function Tests  Recent Labs  08/10/13 1440  AST 61*  ALT 23  ALKPHOS 95  BILITOT 0.9  PROT 7.4  ALBUMIN 4.1   No results found for this basename: LIPASE, AMYLASE,  in the last 72 hours Cardiac Enzymes  Recent Labs  08/10/13 1440 08/11/13 0531  TROPONINI 5.17* 6.44*   BNP No components found with this basename: POCBNP,  D-Dimer No results found for this basename: DDIMER,  in the last 72 hours Hemoglobin A1C No results found for this basename: HGBA1C,  in the last 72 hours Fasting Lipid Panel  Recent Labs  08/11/13 0428  CHOL 142  HDL 27*  LDLCALC 79  TRIG 981*  CHOLHDL 5.3  Thyroid Function Tests  Recent Labs  08/10/13 1440  TSH 0.082*    TELE Normal sinus rhythm  ECG  EKG shows persistent diffuse ST segment depression in inferior and lateral leads  Radiology/Studies  No results found.  ASSESSMENT AND PLAN 1.  Non-STEMI secondary to occlusion of vein graft to  obtuse marginal with large thrombus burden. 2. status post left carotid endarterectomy September 2014. 3. type 2 diabetes mellitus 4. hypercholesterolemia  Plan: Continue serial enzymes until peak.  Troponin this morning is still rising. Continue aspirin, statin, beta blocker.  Hold Lasix today because of low blood pressure. Ambulate in the hall with cardiac rehabilitation Update chest x-ray and echocardiogram today. Anticipate home tomorrow if stable. Signed, Cassell Clement MD

## 2013-08-12 ENCOUNTER — Encounter (HOSPITAL_COMMUNITY): Payer: Self-pay | Admitting: Nurse Practitioner

## 2013-08-12 DIAGNOSIS — I214 Non-ST elevation (NSTEMI) myocardial infarction: Secondary | ICD-10-CM | POA: Diagnosis not present

## 2013-08-12 DIAGNOSIS — D649 Anemia, unspecified: Secondary | ICD-10-CM | POA: Diagnosis present

## 2013-08-12 DIAGNOSIS — Z9181 History of falling: Secondary | ICD-10-CM

## 2013-08-12 LAB — CBC
HCT: 30.4 % — ABNORMAL LOW (ref 36.0–46.0)
MCH: 29.1 pg (ref 26.0–34.0)
Platelets: 150 10*3/uL (ref 150–400)
RBC: 3.58 MIL/uL — ABNORMAL LOW (ref 3.87–5.11)
RDW: 13.8 % (ref 11.5–15.5)
WBC: 9 10*3/uL (ref 4.0–10.5)

## 2013-08-12 MED ORDER — LEVOTHYROXINE SODIUM 100 MCG PO TABS: 100.0000 ug | ORAL_TABLET | Freq: Every day | ORAL | Status: DC

## 2013-08-12 MED ORDER — ISOSORBIDE MONONITRATE ER 30 MG PO TB24: 30.0000 mg | ORAL_TABLET | Freq: Every day | ORAL | Status: DC

## 2013-08-12 NOTE — Evaluation (Signed)
Physical Therapy Evaluation Patient Details Name: Jill Howell Morning MRN: [id number] DOB: 03/02/1919 Today's Date: 08/12/2013 [id number]     PT Assessment / Plan / Recommendation History of Present Illness  Unstable angina. 6 days ago she began experiencing chest tightness relieved by nitroglycerin. Since then the episodes have become more frequent. Last night she had 4 separate episodes of chest discomfort each of them relieved by a single nitroglycerin. She also gets right wrist pain relieved by sublingual nitroglycerin. She has not had any nausea or sweating. The culprit vessel is the SVG to the OM which is occluded with a large thrombus burden.  The patient also had a NSTEMI and cardiac cath on 12/2.    Clinical Impression  Patient is a pleasant woman who is willing to participate in therapy today.  She is unsteady on her feet and has decreased balance particularly when upright with walking and multi-tasking.  Since she will likely need these skills on a daily basis to avoid falls and prevent functional decline, I believe this patient is an excellent candidate for Home Health PT services once she is d/c back home.  She is aware of her functional deficits and agrees that HHPT is appropriate and will benefit her well being.    PT Assessment  Patient needs continued PT services    Follow Up Recommendations  Home health PT          Equipment Recommendations  None recommended by PT       Frequency Min 3X/week    Precautions / Restrictions Precautions Precautions: Fall   Pertinent Vitals/Pain No pain reported      Mobility  Bed Mobility Bed Mobility: Not assessed Transfers Transfers: Sit to Stand;Stand to Sit Sit to Stand: 4: Min guard Stand to Sit: 4: Min guard Details for Transfer Assistance: For safety and balance if needed Ambulation/Gait  08/12/13 1300  Ambulation/Gait  Ambulation/Gait Assistance 4: Min guard  Ambulation Distance (Feet) 500 Feet  Assistive device  None  Ambulation/Gait Assistance Details Stumbing occassionally; has difficulty with multi-tasking, has to stop walking to have conversation  Gait Pattern WFL  General Gait Details Unsteady  Stairs Yes  Stairs Assistance 4: Min guard  Stair Management Technique Alternating pattern  Number of Stairs 10         PT Diagnosis: Difficulty walking;Generalized weakness  PT Problem List: Decreased strength;Decreased activity tolerance;Decreased balance;Decreased mobility;Decreased coordination;Decreased safety awareness PT Treatment Interventions: Stair training;DME instruction;Gait training;Functional mobility training;Therapeutic activities;Balance training;Neuromuscular re-education;Therapeutic exercise     PT Goals(Current goals can be found in the care plan section) Acute Rehab PT Goals Patient Stated Goal: return home PT Goal Formulation: With patient Time For Goal Achievement: 08/26/13 Potential to Achieve Goals: Good  Visit Information  Last PT Received On: 08/12/13 Assistance Needed: +1 History of Present Illness: Unstable angina. 6 days ago she began experiencing chest tightness relieved by nitroglycerin. Since then the episodes have become more frequent. Last night she had 4 separate episodes of chest discomfort each of them relieved by a single nitroglycerin. She also gets right wrist pain relieved by sublingual nitroglycerin. She has not had any nausea or sweating..       Prior Functioning  Home Living Family/patient expects to be discharged to:: Private residence Living Arrangements: Alone Available Help at Discharge: Family (2 Sons live near her) Type of Home: House Home Access: Stairs to enter [employer] of Steps: 3 Entrance Stairs-Rails: Right Home Layout: Two level;Able to live on main level with bedroom/bathroom Alternate  Level Stairs-Number of Steps: 10 Alternate Level Stairs-Rails: None Home Equipment: Walker - 4 wheels Additional Comments: She  doesn't use the upstairs Prior Function Level of Independence: Independent Communication Communication: No difficulties    Cognition  Cognition Arousal/Alertness: Awake/alert Behavior During Therapy: WFL for tasks assessed/performed Overall Cognitive Status: Within Functional Limits for tasks assessed    Extremity/Trunk Assessment Upper Extremity Assessment Upper Extremity Assessment: Defer to OT evaluation Lower Extremity Assessment Lower Extremity Assessment: Generalized weakness   Balance Standardized Balance Assessment Standardized Balance Assessment: Dynamic Gait Index Dynamic Gait Index Level Surface: Normal Change in Gait Speed: Mild Impairment Gait with Horizontal Head Turns: Mild Impairment Gait with Vertical Head Turns: Mild Impairment Gait and Pivot Turn: Normal Step Over Obstacle: Normal Step Around Obstacles: Normal Steps: Mild Impairment Total Score: 20  End of Session PT - End of Session Equipment Utilized During Treatment: Gait belt Activity Tolerance: Patient tolerated treatment well Patient left: in chair;with call bell/phone within reach  GP    Barrie Dunker, SPT Pager:  [phone]  Barrie Dunker 08/12/2013, 2:05 PM

## 2013-08-12 NOTE — Discharge Summary (Signed)
Discharge Summary   Patient ID: Jill Howell,  MRN: [id number], DOB/AGE: 12-24-1918 79 y.o.  Admit date: 08/10/2013 Discharge date: 08/12/2013  Primary Care Provider: Crawford Givens Primary Cardiologist: Wylene Simmer, MD   Discharge Diagnoses Principal Problem:   NSTEMI (non-ST elevated myocardial infarction)  **S/p catheterization this admission revealing a new occlusion of the saphenous vein graft to the obtuse marginal-->Medically managed.  Active Problems:   CORONARY ARTERY DISEASE   DIABETES MELLITUS, TYPE II   At risk for falls   HYPOTHYROIDISM  **TSH 0.082 this admission.  Synthroid reduced to daily.   HYPERLIPIDEMIA   HEARING IMPAIRMENT   GERD   Carotid artery disease s/p L CEA.   PAF (paroxysmal atrial fibrillation)  **Not felt to be an anticoagulation candidate 2/2 risk for falls.   TIA (transient ischemic attack)   Anemia  Allergies Allergies  Allergen Reactions  . Penicillins     REACTION: ?   Procedures  Cardiac Catheterization 12.2.2014  Procedural Findings: Hemodynamics:  AO 145/63 mean 99 mm Hg LV 144/24 mm Hg              Coronary angiography: Coronary dominance: right  Left mainstem: 90-95% ostial, 90-95% distal. Heavily calcified. Left anterior descending (LAD): Diffuse 90% proximal, 100% after the first diagonal. Left circumflex (LCx): 99% proximal. 100% after small OM1. Right coronary artery (RCA): 100% ostial occlusion. SVG to the PDA is widely patent and supplies the entire dominant RCA. SVG to the OM is occluded in the body of the graft with large thrombus burden. SVG to the diagonal is widely patent.There are faint left to left collaterals to the OM. LIMA to the LAD is widely patent.  Left ventriculography: Left ventricular systolic function is overall normal, LVEF is estimated at 55-60%, there is mild inferior hypokinesis. There is severe mitral annular calcification with moderate 2+ mitral regurgitation   Final Conclusions:     1. Critical left main and 3 vessel coronary artery disease. 2. Patent LIMA to the LAD 3. Patent SVG to the diagonal. 4. Patent SVG to the PDA 5. Occluded SVG to the OM. 6. Good LV function 7. Moderate 2+ mitral insufficiency. _____________   2D Echocardiogram 12.3.2014  Study Conclusions  - Left ventricle: The cavity size was normal. Wall thickness   was normal. Systolic function was normal. The estimated   ejection fraction was in the range of 50% to 55%. Wall   motion was normal; there were no regional wall motion   abnormalities. Features are consistent with a pseudonormal   left ventricular filling pattern, with concomitant   abnormal relaxation and increased filling pressure (grade   2 diastolic dysfunction). - Mitral valve: Calcified annulus. Mildly thickened leaflets   . Moderate regurgitation. Valve area by continuity   equation (using LVOT flow): 1.08cm^2. - Left atrium: The atrium was mildly dilated. - Atrial septum: No defect or patent foramen ovale was   identified. - Pulmonary arteries: PA peak pressure: 51mm Hg (S). _____________   History of Present Illness  79 year old female with prior history of coronary artery disease status post coronary artery bypass grafting in 2000. More recently, she is status post left carotid endarterectomy in September of 2014. She also has a history of remote atrial fibrillation however is not maintained on oral anticoagulation secondary to high risk for falls. Jill Howell was in her usual state of health until approximately 6 days prior to admission when she began to experience nitrate responsive chest discomfort. On the evening prior to  admission, she had multiple episodes of nitrate responsive chest discomfort radiating to her right wrist. She was seen in clinic on December 2, and her ECG showed new ST segment depression in inferolateral leads.  She was admitted directly from the office to the hospital for further evaluation and management  of acute coronary syndrome.  Hospital Course  Jill Howell ruled in for myocardial infarction, eventually peaking her troponin at 6.44. She was placed on heparin and nitroglycerin and arrangements were made for diagnostic cardiac catheterization, which took place on December 2. Catheterization revealed severe, native, multivessel coronary artery disease with 3 of 4 patent grafts. The vein graft to the obtuse marginal was newly occluded with evidence of large thrombus burden. LV function was normal. The vein graft to the obtuse marginal was felt to be the culprit vessel and medical therapy was recommended. We have opted not to initiate plavix therapy secondary to her h/o falls.  Post procedure, 2-D echocardiography was performed and confirmed normal LV function without regional wall motion abnormalities. Jill Howell was maintained on aspirin, beta blocker, and statin therapy, and long-acting nitrate therapy was added as well.on this regimen, Jill Howell has had no further chest discomfort.  She has been seen by cardiac rehabilitation and has been noted to be deconditioned.  Home Health Physical Therapy has been arranged and she will be discharged home today in good condition.    Of note, Jill Howell has been found to be hyperthyroid with a TSH of 0.082.  We have scaled back her synthroid dose to daily.  Discharge Vitals Blood pressure 114/40, pulse 74, temperature 98.2 F (36.8 C), temperature source Oral, resp. rate 18, height 5\' 4"  (1.626 m), weight 116 lb 9.6 oz (52.889 kg), SpO2 98.00%.  Filed Weights   08/11/13 0532 08/11/13 2339 08/12/13 0508  Weight: 114 lb 4.7 oz (51.844 kg) 117 lb (53.071 kg) 116 lb 9.6 oz (52.889 kg)   Labs  CBC  Recent Labs  08/10/13 1440 08/11/13 0428 08/12/13 0455  WBC 13.8* 9.1 9.0  NEUTROABS 8.6*  --   --   HGB 12.8 11.2* 10.4*  HCT 37.6 33.6* 30.4*  MCV 86.2 86.2 84.9  PLT 194 182 150   Basic Metabolic Panel  Recent Labs  08/10/13 1440 08/11/13 0428  NA 136  137  K 4.4 4.0  CL 96 101  CO2 28 26  GLUCOSE 104* 99  BUN 14 16  CREATININE 0.77 0.84  CALCIUM 9.7 8.9   Liver Function Tests  Recent Labs  08/10/13 1440  AST 61*  ALT 23  ALKPHOS 95  BILITOT 0.9  PROT 7.4  ALBUMIN 4.1   Cardiac Enzymes  Recent Labs  08/10/13 1440 08/11/13 0531 08/11/13 1107  TROPONINI 5.17* 6.44* 3.25*    Fasting Lipid Panel  Recent Labs  08/11/13 0428  CHOL 142  HDL 27*  LDLCALC 79  TRIG 161*  CHOLHDL 5.3   Thyroid Function Tests  Recent Labs  08/10/13 1440  TSH 0.082*   Disposition  Pt is being discharged home today in good condition.  Follow-up Plans & Appointments      Follow-up Information   Follow up with Cassell Clement, MD On 08/17/2013. (9:00 AM)    Specialty:  Cardiology   Contact information:   [address] [place] [address] [phone]       Follow up with Crawford Givens, MD. (as scheduled.)    Specialty:  Family Medicine   Contact information:   [phone]  [address] [place] [address] [phone]      Discharge Medications    Medication List         aspirin 81 MG EC tablet  Take 81 mg by mouth daily.     atenolol 25 MG tablet  Commonly known as:  TENORMIN  Take 12.5 mg by mouth daily.     digoxin 0.125 MG tablet  Commonly known as:  LANOXIN  Take 1 tablet (0.125 mg total) by mouth daily.     furosemide 40 MG tablet  Commonly known as:  LASIX  Take 20 mg by mouth daily.     isosorbide mononitrate 30 MG 24 hr tablet  Commonly known as:  IMDUR  Take 1 tablet (30 mg total) by mouth daily.     levothyroxine 100 MCG tablet  Commonly known as:  SYNTHROID  Take 1 tablet (100 mcg total) by mouth daily.     nitroGLYCERIN 0.4 MG SL tablet  Commonly known as:  NITROSTAT  Place 1 tablet (0.4 mg total) under the tongue every 5 (five) minutes as needed for chest pain.     pantoprazole 40 MG tablet  Commonly known as:  PROTONIX  Take 1 tablet (40 mg total) by mouth  daily.     PRESERVISION AREDS PO  Take 1 tablet by mouth daily.     pyridoxine 200 MG tablet  Commonly known as:  B-6  Take 200 mg by mouth daily.     simvastatin 40 MG tablet  Commonly known as:  ZOCOR  Take 40 mg by mouth every evening.     vitamin C 1000 MG tablet  Take 500 mg by mouth daily.       Outstanding Labs/Studies  F/u cbc/bmet on 12/9.  Duration of Discharge Encounter   Greater than 30 minutes including physician time.  Signed, Nicolasa Ducking NP 08/12/2013, 11:57 AM

## 2013-08-12 NOTE — Progress Notes (Signed)
Patient Name: Jill Howell Date of Encounter: 08/12/2013     Active Problems:   Unstable angina   NSTEMI (non-ST elevated myocardial infarction)    SUBJECTIVE The patient is not having any chest discomfort this morning.  Denies dyspnea.  Rhythm is normal sinus rhythm. Tolerating ambulation with cardiac rehab. States her son will stay with her a few days at discharge. CURRENT MEDS . aspirin EC  81 mg Oral Daily  . atenolol  12.5 mg Oral Daily  . azelastine  2 spray Each Nare QHS   And  . fluticasone  2 spray Each Nare QHS  . digoxin  0.125 mg Oral Daily  . heparin  5,000 Units Subcutaneous Q8H  . isosorbide mononitrate  30 mg Oral Daily  . levothyroxine  125 mcg Oral QAC breakfast  . loratadine  10 mg Oral Daily  . pantoprazole  40 mg Oral Daily  . pyridoxine  200 mg Oral Daily  . simvastatin  40 mg Oral q1800    OBJECTIVE  Filed Vitals:   08/11/13 1351 08/11/13 2040 08/11/13 2339 08/12/13 0508  BP: 125/58 116/45  119/57  Pulse: 78 81  78  Temp: 97.8 F (36.6 C) 98 F (36.7 C)  98.2 F (36.8 C)  TempSrc: Oral Oral  Oral  Resp:  20  18  Height:      Weight:   117 lb (53.071 kg) 116 lb 9.6 oz (52.889 kg)  SpO2: 97% 97%  97%    Intake/Output Summary (Last 24 hours) at 08/12/13 0907 Last data filed at 08/12/13 0836  Gross per 24 hour  Intake    720 ml  Output   1300 ml  Net   -580 ml   Filed Weights   08/11/13 0532 08/11/13 2339 08/12/13 0508  Weight: 114 lb 4.7 oz (51.844 kg) 117 lb (53.071 kg) 116 lb 9.6 oz (52.889 kg)    PHYSICAL EXAM  General: Pleasant, NAD. Neuro: Alert and oriented X 3. Moves all extremities spontaneously. Psych: Normal affect. HEENT:  Normal  Neck: Supple.  Soft left carotid bruit Lungs:  Resp regular and unlabored, CTA. Heart: RRR no s3, s4, or murmurs. Abdomen: Soft, non-tender, non-distended, BS + x 4.  The groin reveals no ecchymosis or hematoma.Mild tenderness over groin. No bruit or pulsatile mass. Extremities: No  clubbing, cyanosis or edema. DP/PT/Radials 2+ and equal bilaterally.  Accessory Clinical Findings  CBC  Recent Labs  08/10/13 1440 08/11/13 0428 08/12/13 0455  WBC 13.8* 9.1 9.0  NEUTROABS 8.6*  --   --   HGB 12.8 11.2* 10.4*  HCT 37.6 33.6* 30.4*  MCV 86.2 86.2 84.9  PLT 194 182 150   Basic Metabolic Panel  Recent Labs  08/10/13 1440 08/11/13 0428  NA 136 137  K 4.4 4.0  CL 96 101  CO2 28 26  GLUCOSE 104* 99  BUN 14 16  CREATININE 0.77 0.84  CALCIUM 9.7 8.9   Liver Function Tests  Recent Labs  08/10/13 1440  AST 61*  ALT 23  ALKPHOS 95  BILITOT 0.9  PROT 7.4  ALBUMIN 4.1   No results found for this basename: LIPASE, AMYLASE,  in the last 72 hours Cardiac Enzymes  Recent Labs  08/10/13 1440 08/11/13 0531 08/11/13 1107  TROPONINI 5.17* 6.44* 3.25*   BNP No components found with this basename: POCBNP,  D-Dimer No results found for this basename: DDIMER,  in the last 72 hours Hemoglobin A1C No results found for this basename: HGBA1C,  in the last 72 hours Fasting Lipid Panel  Recent Labs  08/11/13 0428  CHOL 142  HDL 27*  LDLCALC 79  TRIG 657*  CHOLHDL 5.3   Thyroid Function Tests  Recent Labs  08/10/13 1440  TSH 0.082*    TELE Normal sinus rhythm  ECG    Radiology/Studies  No results found.  ASSESSMENT AND PLAN 1.  Non-STEMI secondary to occlusion of vein graft to  obtuse marginal with large thrombus burden. 2. status post left carotid endarterectomy September 2014. 3. type 2 diabetes mellitus 4. hypercholesterolemia 5. Mild anemia. No evidence of active bleeding. States stool color normal. 6. Remote history of atrial fib. No warfarin because of fall history. Plan:  Continue aspirin, statin, beta blocker,lasix digoxin..  Ambulate in the hall with cardiac rehabilitation. Physical therapy to see. She Leichter benefit from home PT. She is also expressing interest in outpatient cardiac rehab [facility]).  Anticipate home today  after PT sees her. Follow up with me or NP next week for OV and followup CBC Signed, Cassell Clement MD

## 2013-08-12 NOTE — Progress Notes (Signed)
Received call from physical therapy.  They recommend home PT.  I have done face-to-face in computer for home health nurse and physical therapy.  Okay for discharge today.

## 2013-08-12 NOTE — Care Management Note (Signed)
    Page 1 of 1   08/12/2013     11:48:37 AM   CARE MANAGEMENT NOTE 08/12/2013  Patient:  Jill Howell, Jill Howell   Account Number:  [id number]  Date Initiated:  08/12/2013  Documentation initiated by:  HUTCHINSON,CRYSTAL  Subjective/Objective Assessment:   Admitted with chest pain, angina     Action/Plan:   CM Consult:  HH:  PT   Anticipated DC Date:  08/12/2013   Anticipated DC Plan:  HOME W HOME HEALTH SERVICES      DC Planning Services  CM consult      [facility] Choice  HOME HEALTH   Choice offered to / List presented to:  C-1 Patient        HH arranged  HH-2 PT      [facility] agency  [facility]   Status of service:  Completed, signed off Medicare Important Message given?   (If response is "NO", the following Medicare IM given date fields will be blank) Date Medicare IM given:   Date Additional Medicare IM given:    Discharge Disposition:  HOME W HOME HEALTH SERVICES  Per UR Regulation:  Reviewed for med. necessity/level of care/duration of stay  If discussed at Long Length of Stay Meetings, dates discussed:    Comments:  HH:  PT with [facility].  Patient lives alone in her home and son lives in [place] but will provide assistance as needed.   [facility]:  Lupita Leash [phone] notified of planned d/c for 08/12/2013.   SOC to begin within 24 -48 hours post discharge. Donato Schultz, RN, BSN, [place], CCM 08/12/2013

## 2013-08-12 NOTE — Evaluation (Signed)
Read, reviewed, and agree.  Gabriella Woodhead B. Omar Orrego, PT, DPT #[phone]  

## 2013-08-12 NOTE — Progress Notes (Signed)
CARDIAC REHAB PHASE I   PRE:  Rate/Rhythm: 85SR  BP:  Supine: 114/50  Sitting:   Standing:    SaO2: 97%RA  MODE:  Ambulation: 700 ft   POST:  Rate/Rhythm: 82  BP:  Supine:   Sitting: 120/50  Standing:    SaO2: 99%RA [id number] Pt walked 700 ft after shoes applied with hand held asst. [place] today. Pt states her son can stay with her a few days as yesterday she made it sound as if no one could stay with her. Discussed with pt walking with son to get stronger and possibly having home OT/PT. PT to evaluate for home needs. Pt states she has fallen 10 or 11 times. Once in the wood shed getting wood. Reviewed NTG use but pt still needs reenforcement. She took NTG over days this time and knew she needed to call EMS but hesitated due to cost. Discussed importance of calling EMS if needed. Pt has attended [facility] 2 in [place] and would like to return when stronger. Will refer. In recliner with call bell. Follows diabetic diet.   Luetta Nutting, RN BSN  08/12/2013 9:37 AM

## 2013-08-14 DIAGNOSIS — Z8673 Personal history of transient ischemic attack (TIA), and cerebral infarction without residual deficits: Secondary | ICD-10-CM | POA: Diagnosis not present

## 2013-08-14 DIAGNOSIS — E119 Type 2 diabetes mellitus without complications: Secondary | ICD-10-CM | POA: Diagnosis not present

## 2013-08-14 DIAGNOSIS — I509 Heart failure, unspecified: Secondary | ICD-10-CM | POA: Diagnosis not present

## 2013-08-14 DIAGNOSIS — K219 Gastro-esophageal reflux disease without esophagitis: Secondary | ICD-10-CM | POA: Diagnosis not present

## 2013-08-14 DIAGNOSIS — I2510 Atherosclerotic heart disease of native coronary artery without angina pectoris: Secondary | ICD-10-CM | POA: Diagnosis not present

## 2013-08-14 DIAGNOSIS — I214 Non-ST elevation (NSTEMI) myocardial infarction: Secondary | ICD-10-CM | POA: Diagnosis not present

## 2013-08-14 DIAGNOSIS — I5030 Unspecified diastolic (congestive) heart failure: Secondary | ICD-10-CM | POA: Diagnosis not present

## 2013-08-14 DIAGNOSIS — E785 Hyperlipidemia, unspecified: Secondary | ICD-10-CM | POA: Diagnosis not present

## 2013-08-14 DIAGNOSIS — Z9181 History of falling: Secondary | ICD-10-CM | POA: Diagnosis not present

## 2013-08-17 ENCOUNTER — Encounter: Payer: Self-pay | Admitting: Cardiology

## 2013-08-17 ENCOUNTER — Ambulatory Visit (INDEPENDENT_AMBULATORY_CARE_PROVIDER_SITE_OTHER): Payer: Medicare Other | Admitting: Cardiology

## 2013-08-17 ENCOUNTER — Other Ambulatory Visit (INDEPENDENT_AMBULATORY_CARE_PROVIDER_SITE_OTHER): Payer: Medicare Other

## 2013-08-17 VITALS — BP 140/60 | HR 77 | Wt 116.0 lb

## 2013-08-17 DIAGNOSIS — I259 Chronic ischemic heart disease, unspecified: Secondary | ICD-10-CM | POA: Diagnosis not present

## 2013-08-17 DIAGNOSIS — I214 Non-ST elevation (NSTEMI) myocardial infarction: Secondary | ICD-10-CM | POA: Diagnosis not present

## 2013-08-17 DIAGNOSIS — E7800 Pure hypercholesterolemia, unspecified: Secondary | ICD-10-CM | POA: Diagnosis not present

## 2013-08-17 DIAGNOSIS — E785 Hyperlipidemia, unspecified: Secondary | ICD-10-CM | POA: Diagnosis not present

## 2013-08-17 DIAGNOSIS — E039 Hypothyroidism, unspecified: Secondary | ICD-10-CM

## 2013-08-17 DIAGNOSIS — E119 Type 2 diabetes mellitus without complications: Secondary | ICD-10-CM

## 2013-08-17 DIAGNOSIS — I509 Heart failure, unspecified: Secondary | ICD-10-CM | POA: Diagnosis not present

## 2013-08-17 DIAGNOSIS — K219 Gastro-esophageal reflux disease without esophagitis: Secondary | ICD-10-CM | POA: Diagnosis not present

## 2013-08-17 DIAGNOSIS — I119 Hypertensive heart disease without heart failure: Secondary | ICD-10-CM | POA: Diagnosis not present

## 2013-08-17 DIAGNOSIS — I2510 Atherosclerotic heart disease of native coronary artery without angina pectoris: Secondary | ICD-10-CM | POA: Diagnosis not present

## 2013-08-17 DIAGNOSIS — I779 Disorder of arteries and arterioles, unspecified: Secondary | ICD-10-CM

## 2013-08-17 DIAGNOSIS — I5030 Unspecified diastolic (congestive) heart failure: Secondary | ICD-10-CM | POA: Diagnosis not present

## 2013-08-17 LAB — HEPATIC FUNCTION PANEL
AST: 32 U/L (ref 0–37)
Albumin: 4.2 g/dL (ref 3.5–5.2)
Alkaline Phosphatase: 79 U/L (ref 39–117)
Total Protein: 7.5 g/dL (ref 6.0–8.3)

## 2013-08-17 LAB — LIPID PANEL
Cholesterol: 184 mg/dL (ref 0–200)
HDL: 29 mg/dL — ABNORMAL LOW (ref >39.00)
Total CHOL/HDL Ratio: 6
Triglycerides: 260 mg/dL — ABNORMAL HIGH (ref 0.0–149.0)

## 2013-08-17 LAB — HEMOGLOBIN A1C: Hgb A1c MFr Bld: 5.6 % (ref 4.6–6.5)

## 2013-08-17 LAB — BASIC METABOLIC PANEL
CO2: 29 mEq/L (ref 19–32)
Calcium: 9.4 mg/dL (ref 8.4–10.5)
GFR: 61.36 mL/min (ref >60.00)
Glucose, Bld: 110 mg/dL — ABNORMAL HIGH (ref 70–99)
Sodium: 130 mEq/L — ABNORMAL LOW (ref 135–145)

## 2013-08-17 LAB — LDL CHOLESTEROL, DIRECT: Direct LDL: 116.1 mg/dL

## 2013-08-17 MED ORDER — LOSARTAN POTASSIUM 25 MG PO TABS: 25.0000 mg | ORAL_TABLET | Freq: Every day | ORAL | Status: DC

## 2013-08-17 NOTE — Patient Instructions (Addendum)
START LOSARTAN 25 MG DAILY  Your physician recommends that you schedule a follow-up appointment in: 3-4 WEEK OV/BMET/TSH  Will arrange for you to start Outpatient Cardiac Rehab in [place] after January 1st

## 2013-08-17 NOTE — Assessment & Plan Note (Signed)
The patient had mild left chest discomfort yesterday relieved by sublingual nitroglycerin.  She has been under a lot of emotional and mental stress dealing with some legal issues involving a dispute concerning land boundaries.  She is working with a Clinical research associate.  I have encouraged her to emotional stress because it is bad for her heart and I gave her a note to that effect.

## 2013-08-17 NOTE — Progress Notes (Signed)
Quick Note:  Please report to patient. The recent labs are stable. Continue same medication and careful diet. A1C 5.6 very good. Cholesterol and TG higher--watch diet. Continue simvastatin. ______

## 2013-08-17 NOTE — Progress Notes (Signed)
Jill Howell Date of Birth:  10/07/1918 [address] [place], [place]  [address] [phone]         Fax   [phone]  History of Present Illness: This pleasant 79 year old woman is seen for a post hospital followup office visit. She has a history of ischemic heart disease. She had coronary artery bypass graft surgery in 2000. She has a history of diastolic congestive heart failure and high cholesterol. She has had GERD and is on Protonix. She has had a history of hypothyroidism. She has a known left carotid bruit.  She had successful left carotid endarterectomy on 05/19/2013 by Dr. Darrick Penna.   She has had no subsequent TIA symptoms. She states that she is a diabetic although her blood sugars here in the office have been in the range of 100-102. She watches her diet. She is on no diabetic medicine.   on 08/10/13 the patient presented to the office with history of crescendo angina.  She was hospitalized emergently.  He underwent left heart cardiac catheterization by Dr. [place] which showed that the saphenous vein graft to the obtuse marginal was occluded and the body of the graft with a large thrombus burden she was found to have a patent LIMA to the LAD, patent saphenous vein graft to the diagonal, and patent saphenous vein graft to the posterior descending.  She had good LV function and showed moderate 2+ mitral regurgitation.  Patient had an echocardiogram on 08/11/13 which showed ejection fraction in the range of 50-55% with no regional wall motion abnormalities and she did have grade 2 diastolic dysfunction.  Her pulmonary artery pressure was elevated at 51.  The patient did well post catheter.  Her troponin peaked at 6.44.  She was not a candidate for PCI and medical therapy was recommended.  Because of her history of falls Plavix therapy was not initiated.  Long-acting nitrate therapy was added to her regimen and she will continue on aspirin beta blocker and statin.  Home physical  therapy is seeing her.  Current Outpatient Prescriptions  Medication Sig Dispense Refill  . Ascorbic Acid (VITAMIN C) 1000 MG tablet Take 500 mg by mouth daily.      [name] aspirin 81 MG EC tablet Take 81 mg by mouth daily.        [name] atenolol (TENORMIN) 25 MG tablet Take 12.5 mg by mouth daily.       [name] azelastine (ASTELIN) 137 MCG/SPRAY nasal spray Place 2 sprays into both nostrils at bedtime. Use in each nostril as directed      . digoxin (LANOXIN) 0.125 MG tablet Take 1 tablet (0.125 mg total) by mouth daily.  90 tablet  3  . fluticasone (FLONASE) 50 MCG/ACT nasal spray Place 2 sprays into both nostrils daily.      . furosemide (LASIX) 40 MG tablet Take 20 mg by mouth daily.      . isosorbide mononitrate (IMDUR) 30 MG 24 hr tablet Take 1 tablet (30 mg total) by mouth daily.  30 tablet  6  . levothyroxine (SYNTHROID) 100 MCG tablet Take 1 tablet (100 mcg total) by mouth daily.  30 tablet  3  . Multiple Vitamins-Minerals (PRESERVISION AREDS PO) Take 1 tablet by mouth daily.      . nitroGLYCERIN (NITROSTAT) 0.4 MG SL tablet Place 1 tablet (0.4 mg total) under the tongue every 5 (five) minutes as needed for chest pain.  100 tablet  prn  . pantoprazole (PROTONIX) 40 MG tablet Take 1  tablet (40 mg total) by mouth daily.  90 tablet  3  . pyridoxine (B-6) 200 MG tablet Take 200 mg by mouth daily.        . simvastatin (ZOCOR) 40 MG tablet Take 40 mg by mouth every evening.      [name] losartan (COZAAR) 25 MG tablet Take 1 tablet (25 mg total) by mouth daily.  90 tablet  3   No current facility-administered medications for this visit.    Allergies  Allergen Reactions  . Penicillins     REACTION: ?    Patient Active Problem List   Diagnosis Date Noted  . At risk for falls 08/12/2013  . Anemia   . Unstable angina 08/10/2013  . NSTEMI (non-ST elevated myocardial infarction) 08/10/2013  . TIA (transient ischemic attack) 05/17/2013  . Occlusion and stenosis of carotid artery without mention of cerebral  infarction 05/13/2013  . Fall at home 05/11/2013  . Leg pain 08/11/2012  . PAF (paroxysmal atrial fibrillation) 08/11/2012  . Back pain 05/07/2012  . Carotid artery disease 08/19/2011  . Dermatitis 05/09/2011  . UTI (lower urinary tract infection) 03/21/2011  . HYPOTHYROIDISM 09/15/2007  . DIABETES MELLITUS, TYPE II 09/15/2007  . HYPERLIPIDEMIA 09/15/2007  . HEARING IMPAIRMENT 09/15/2007  . CORONARY ARTERY DISEASE 09/15/2007  . CONGESTIVE HEART FAILURE 09/15/2007  . ALLERGIC RHINITIS 09/15/2007  . Unspecified asthma(493.90) 09/15/2007  . GERD 09/15/2007    History  Smoking status  . Never Smoker   Smokeless tobacco  . Never Used    History  Alcohol Use No    Family History  Problem Relation Age of Onset  . Heart attack Father   . Heart disease Son     before age 78    Review of Systems: Constitutional: no fever chills diaphoresis or fatigue or change in weight.  Head and neck: no hearing loss, no epistaxis, no photophobia or visual disturbance. Respiratory: No cough, shortness of breath or wheezing. Cardiovascular: No chest pain peripheral edema, palpitations. Gastrointestinal: No abdominal distention, no abdominal pain, no change in bowel habits hematochezia or melena. Genitourinary: No dysuria, no frequency, no urgency, no nocturia. Musculoskeletal:No arthralgias, no back pain, no gait disturbance or myalgias. Neurological: No dizziness, no headaches, no numbness, no seizures, no syncope, no weakness, no tremors. Hematologic: No lymphadenopathy, no easy bruising. Psychiatric: No confusion, no hallucinations, no sleep disturbance.    Physical Exam: Filed Vitals:   08/17/13 0836  BP: 140/60  Pulse: 77   the general appearance reveals a thin elderly alert woman in no distress.The head and neck exam reveals pupils equal and reactive.  Extraocular movements are full.  There is no scleral icterus.  The mouth and pharynx are normal.  The neck is supple.  The  carotids reveal no bruits.  The jugular venous pressure is normal.  The  thyroid is not enlarged.  There is no lymphadenopathy.  The chest is clear to percussion and auscultation.  There are no rales or rhonchi.  Expansion of the chest is symmetrical.  The precordium is quiet.  The first heart sound is normal.  The second heart sound is physiologically split.  There is no murmur gallop rub or click.  There is no abnormal lift or heave.  The abdomen is soft and nontender.  The bowel sounds are normal.  The liver and spleen are not enlarged.  There are no abdominal masses.  There are no abdominal bruits.  Extremities reveal good pedal pulses.  There is no phlebitis or edema.  There is no cyanosis or clubbing.  Strength is normal and symmetrical in all extremities.  There is no lateralizing weakness.  There are no sensory deficits.  The skin is warm and dry.  There is no rash.  EKG today shows normal sinus rhythm and mild ST depression in the inferolateral leads.   Assessment / Plan: The patient is to continue same medication.  Add losartan 25 mg daily. After January 1 would like her to be part of the outpatient cardiac rehabilitation program.  She will attend the [facility] program. Recheck here in 3-4 weeks for followup office visit basal metabolic panel and TSH and EKG.

## 2013-08-17 NOTE — Assessment & Plan Note (Signed)
Blood pressure today is slightly elevated.  I rechecked it and got 140/60.  We will add losartan 25 mg one daily.  She has normal renal function.

## 2013-08-17 NOTE — Assessment & Plan Note (Signed)
The patient has not had any TIA symptoms. 

## 2013-08-18 ENCOUNTER — Telehealth: Payer: Self-pay | Admitting: *Deleted

## 2013-08-18 NOTE — Telephone Encounter (Signed)
Message copied by Burnell Blanks on Wed Aug 18, 2013 10:32 AM ------      Message from: Cassell Clement      Created: Tue Aug 17, 2013  8:31 PM       Please report to patient.  The recent labs are stable. Continue same medication and careful diet. A1C 5.6 very good.  Cholesterol and TG higher--watch diet. Continue simvastatin. ------

## 2013-08-18 NOTE — Telephone Encounter (Signed)
Advised patient of lab results  

## 2013-08-19 DIAGNOSIS — I509 Heart failure, unspecified: Secondary | ICD-10-CM | POA: Diagnosis not present

## 2013-08-19 DIAGNOSIS — I2510 Atherosclerotic heart disease of native coronary artery without angina pectoris: Secondary | ICD-10-CM | POA: Diagnosis not present

## 2013-08-19 DIAGNOSIS — K219 Gastro-esophageal reflux disease without esophagitis: Secondary | ICD-10-CM | POA: Diagnosis not present

## 2013-08-19 DIAGNOSIS — I214 Non-ST elevation (NSTEMI) myocardial infarction: Secondary | ICD-10-CM | POA: Diagnosis not present

## 2013-08-19 DIAGNOSIS — I5030 Unspecified diastolic (congestive) heart failure: Secondary | ICD-10-CM | POA: Diagnosis not present

## 2013-08-19 DIAGNOSIS — E119 Type 2 diabetes mellitus without complications: Secondary | ICD-10-CM | POA: Diagnosis not present

## 2013-08-23 DIAGNOSIS — E119 Type 2 diabetes mellitus without complications: Secondary | ICD-10-CM | POA: Diagnosis not present

## 2013-08-23 DIAGNOSIS — K219 Gastro-esophageal reflux disease without esophagitis: Secondary | ICD-10-CM | POA: Diagnosis not present

## 2013-08-23 DIAGNOSIS — I509 Heart failure, unspecified: Secondary | ICD-10-CM | POA: Diagnosis not present

## 2013-08-23 DIAGNOSIS — I2510 Atherosclerotic heart disease of native coronary artery without angina pectoris: Secondary | ICD-10-CM | POA: Diagnosis not present

## 2013-08-23 DIAGNOSIS — I5030 Unspecified diastolic (congestive) heart failure: Secondary | ICD-10-CM | POA: Diagnosis not present

## 2013-08-23 DIAGNOSIS — I214 Non-ST elevation (NSTEMI) myocardial infarction: Secondary | ICD-10-CM | POA: Diagnosis not present

## 2013-08-25 DIAGNOSIS — I2510 Atherosclerotic heart disease of native coronary artery without angina pectoris: Secondary | ICD-10-CM | POA: Diagnosis not present

## 2013-08-25 DIAGNOSIS — I509 Heart failure, unspecified: Secondary | ICD-10-CM | POA: Diagnosis not present

## 2013-08-25 DIAGNOSIS — E119 Type 2 diabetes mellitus without complications: Secondary | ICD-10-CM | POA: Diagnosis not present

## 2013-08-25 DIAGNOSIS — K219 Gastro-esophageal reflux disease without esophagitis: Secondary | ICD-10-CM | POA: Diagnosis not present

## 2013-08-25 DIAGNOSIS — I5030 Unspecified diastolic (congestive) heart failure: Secondary | ICD-10-CM | POA: Diagnosis not present

## 2013-08-25 DIAGNOSIS — I214 Non-ST elevation (NSTEMI) myocardial infarction: Secondary | ICD-10-CM | POA: Diagnosis not present

## 2013-08-30 DIAGNOSIS — I509 Heart failure, unspecified: Secondary | ICD-10-CM | POA: Diagnosis not present

## 2013-08-30 DIAGNOSIS — I5030 Unspecified diastolic (congestive) heart failure: Secondary | ICD-10-CM | POA: Diagnosis not present

## 2013-08-30 DIAGNOSIS — I2510 Atherosclerotic heart disease of native coronary artery without angina pectoris: Secondary | ICD-10-CM | POA: Diagnosis not present

## 2013-08-30 DIAGNOSIS — I214 Non-ST elevation (NSTEMI) myocardial infarction: Secondary | ICD-10-CM | POA: Diagnosis not present

## 2013-08-30 DIAGNOSIS — K219 Gastro-esophageal reflux disease without esophagitis: Secondary | ICD-10-CM | POA: Diagnosis not present

## 2013-08-30 DIAGNOSIS — E119 Type 2 diabetes mellitus without complications: Secondary | ICD-10-CM | POA: Diagnosis not present

## 2013-08-31 DIAGNOSIS — E119 Type 2 diabetes mellitus without complications: Secondary | ICD-10-CM | POA: Diagnosis not present

## 2013-08-31 DIAGNOSIS — I509 Heart failure, unspecified: Secondary | ICD-10-CM | POA: Diagnosis not present

## 2013-08-31 DIAGNOSIS — I214 Non-ST elevation (NSTEMI) myocardial infarction: Secondary | ICD-10-CM | POA: Diagnosis not present

## 2013-08-31 DIAGNOSIS — I5030 Unspecified diastolic (congestive) heart failure: Secondary | ICD-10-CM | POA: Diagnosis not present

## 2013-08-31 DIAGNOSIS — I2510 Atherosclerotic heart disease of native coronary artery without angina pectoris: Secondary | ICD-10-CM | POA: Diagnosis not present

## 2013-08-31 DIAGNOSIS — K219 Gastro-esophageal reflux disease without esophagitis: Secondary | ICD-10-CM | POA: Diagnosis not present

## 2013-09-06 DIAGNOSIS — I214 Non-ST elevation (NSTEMI) myocardial infarction: Secondary | ICD-10-CM | POA: Diagnosis not present

## 2013-09-06 DIAGNOSIS — E119 Type 2 diabetes mellitus without complications: Secondary | ICD-10-CM | POA: Diagnosis not present

## 2013-09-06 DIAGNOSIS — K219 Gastro-esophageal reflux disease without esophagitis: Secondary | ICD-10-CM | POA: Diagnosis not present

## 2013-09-06 DIAGNOSIS — I5030 Unspecified diastolic (congestive) heart failure: Secondary | ICD-10-CM | POA: Diagnosis not present

## 2013-09-06 DIAGNOSIS — I509 Heart failure, unspecified: Secondary | ICD-10-CM | POA: Diagnosis not present

## 2013-09-06 DIAGNOSIS — I2510 Atherosclerotic heart disease of native coronary artery without angina pectoris: Secondary | ICD-10-CM | POA: Diagnosis not present

## 2013-09-07 ENCOUNTER — Telehealth: Payer: Self-pay | Admitting: Cardiology

## 2013-09-07 NOTE — Telephone Encounter (Signed)
Refaxed as requested. 

## 2013-09-07 NOTE — Telephone Encounter (Signed)
New message     Need order for cardiac rehab with diagnosis and signed by dr.  Valinda Hoar to [phone]

## 2013-09-13 ENCOUNTER — Other Ambulatory Visit: Payer: Medicare Other

## 2013-09-13 ENCOUNTER — Ambulatory Visit: Payer: Medicare Other | Admitting: Cardiology

## 2013-09-14 ENCOUNTER — Telehealth: Payer: Self-pay | Admitting: Cardiology

## 2013-09-14 DIAGNOSIS — I509 Heart failure, unspecified: Secondary | ICD-10-CM | POA: Diagnosis not present

## 2013-09-14 DIAGNOSIS — I5030 Unspecified diastolic (congestive) heart failure: Secondary | ICD-10-CM | POA: Diagnosis not present

## 2013-09-14 DIAGNOSIS — I2510 Atherosclerotic heart disease of native coronary artery without angina pectoris: Secondary | ICD-10-CM | POA: Diagnosis not present

## 2013-09-14 DIAGNOSIS — I214 Non-ST elevation (NSTEMI) myocardial infarction: Secondary | ICD-10-CM | POA: Diagnosis not present

## 2013-09-14 DIAGNOSIS — E119 Type 2 diabetes mellitus without complications: Secondary | ICD-10-CM | POA: Diagnosis not present

## 2013-09-14 DIAGNOSIS — K219 Gastro-esophageal reflux disease without esophagitis: Secondary | ICD-10-CM | POA: Diagnosis not present

## 2013-09-14 NOTE — Telephone Encounter (Signed)
New message    Pt bp is 100/40. Pt bp has always been 140/60. 1st day it has been low. Pt is weak.

## 2013-09-14 NOTE — Telephone Encounter (Signed)
Tried to call patient x 2 line busy, will try again

## 2013-09-14 NOTE — Telephone Encounter (Signed)
Spoke with patient and she is still feeling a little weak but better. Did recheck her blood pressure and it was back up. Stated they felt she was dehydrated. Patient will keep her ov with Tera Helper NP in am.

## 2013-09-15 ENCOUNTER — Ambulatory Visit (INDEPENDENT_AMBULATORY_CARE_PROVIDER_SITE_OTHER): Payer: Medicare Other | Admitting: *Deleted

## 2013-09-15 ENCOUNTER — Encounter: Payer: Self-pay | Admitting: Nurse Practitioner

## 2013-09-15 ENCOUNTER — Ambulatory Visit (INDEPENDENT_AMBULATORY_CARE_PROVIDER_SITE_OTHER): Payer: Medicare Other | Admitting: Nurse Practitioner

## 2013-09-15 VITALS — BP 136/52 | HR 63 | Ht 63.5 in | Wt 119.0 lb

## 2013-09-15 DIAGNOSIS — E039 Hypothyroidism, unspecified: Secondary | ICD-10-CM | POA: Diagnosis not present

## 2013-09-15 DIAGNOSIS — I119 Hypertensive heart disease without heart failure: Secondary | ICD-10-CM

## 2013-09-15 DIAGNOSIS — I2510 Atherosclerotic heart disease of native coronary artery without angina pectoris: Secondary | ICD-10-CM

## 2013-09-15 DIAGNOSIS — E785 Hyperlipidemia, unspecified: Secondary | ICD-10-CM | POA: Diagnosis not present

## 2013-09-15 DIAGNOSIS — I259 Chronic ischemic heart disease, unspecified: Secondary | ICD-10-CM | POA: Diagnosis not present

## 2013-09-15 DIAGNOSIS — I509 Heart failure, unspecified: Secondary | ICD-10-CM | POA: Diagnosis not present

## 2013-09-15 LAB — BASIC METABOLIC PANEL
BUN: 27 mg/dL — ABNORMAL HIGH (ref 6–23)
CHLORIDE: 95 meq/L — AB (ref 96–112)
CO2: 28 mEq/L (ref 19–32)
Calcium: 9.2 mg/dL (ref 8.4–10.5)
Creatinine, Ser: 1 mg/dL (ref 0.4–1.2)
GFR: 57.67 mL/min — ABNORMAL LOW (ref >60.00)
GLUCOSE: 93 mg/dL (ref 70–99)
POTASSIUM: 4.6 meq/L (ref 3.5–5.1)
Sodium: 131 mEq/L — ABNORMAL LOW (ref 135–145)

## 2013-09-15 LAB — CBC WITH DIFFERENTIAL/PLATELET
BASOS ABS: 0 10*3/uL (ref 0.0–0.1)
Basophils Relative: 0.3 % (ref 0.0–3.0)
EOS ABS: 0.2 10*3/uL (ref 0.0–0.7)
Eosinophils Relative: 2.1 % (ref 0.0–5.0)
HEMATOCRIT: 32 % — AB (ref 36.0–46.0)
HEMOGLOBIN: 11 g/dL — AB (ref 12.0–15.0)
LYMPHS ABS: 2.2 10*3/uL (ref 0.7–4.0)
Lymphocytes Relative: 25.7 % (ref 12.0–46.0)
MCHC: 34.3 g/dL (ref 30.0–36.0)
MCV: 83.7 fl (ref 78.0–100.0)
MONO ABS: 1 10*3/uL (ref 0.1–1.0)
Monocytes Relative: 12.2 % — ABNORMAL HIGH (ref 3.0–12.0)
NEUTROS ABS: 5.1 10*3/uL (ref 1.4–7.7)
Neutrophils Relative %: 59.7 % (ref 43.0–77.0)
PLATELETS: 201 10*3/uL (ref 150.0–400.0)
RBC: 3.83 Mil/uL — ABNORMAL LOW (ref 3.87–5.11)
RDW: 14.6 % (ref 11.5–14.6)
WBC: 8.5 10*3/uL (ref 4.5–10.5)

## 2013-09-15 LAB — TSH: TSH: 0.45 u[IU]/mL (ref 0.35–5.50)

## 2013-09-15 MED ORDER — FUROSEMIDE 40 MG PO TABS: 20.0000 mg | ORAL_TABLET | Freq: Every day | ORAL | Status: DC | PRN

## 2013-09-15 NOTE — Patient Instructions (Signed)
Stay on your current medicines but you Duffey take the Lasix just "as needed" - take if you have swelling or start getting short of breath  We will check labs today  Try to be as active as possible  See Dr. Mare Ferrari in about 6 weeks  Call the [facility] office at [phone] if you have any questions, problems or concerns.

## 2013-09-15 NOTE — Progress Notes (Signed)
Jill Howell Date of Birth: [birth year] Medical Record #[id number]  History of Present Illness: Jill Howell is seen back today for a one month check. Seen for Dr. Mare Ferrari. She is 80 years of age. She has known CAD with remote CABG back in 5732, diastolic CHF, HLD, GERD - on PPI, hypothyroidism, prior left CEA backin 2014 and DM.  Presented to the hospital back earlier last month with crescendo angina. Was cathed. And her SVG to the OM was occluded with the body of the graft having a large thrombus burden. Patent LIMA to the LAD, patent SVG to the DX and patent SVG to the PD. Good LV function with 2+MR. Grade 2 diastolic dysfunction noted on echo. Medical therapy was instituted and long acting nitrates were added. NO Plavix due to her history of falls.   Seen a month ago and seemed to be doing ok but BP was up and Losartan was added. She continues to have lots of stress in dealing with legal issues.   Comes back today. Here with her son. Seems to be doing ok. BP has improved. No more chest pain. Not using NTG. Says she is weak and has been weak ever since her carotid surgery. Her incision is still numb - she feels like she tends to drool from that side. Says her hair is falling out. Tries to be active. Not interested in going to cardiac rehab since it is flu season. Has urinary incontinence - thinks her Lasix makes this worse. No swelling. Not really short of breath.   Current Outpatient Prescriptions  Medication Sig Dispense Refill  . Ascorbic Acid (VITAMIN C) 1000 MG tablet Take 500 mg by mouth daily.      [name] aspirin 81 MG EC tablet Take 81 mg by mouth daily.        [name] atenolol (TENORMIN) 25 MG tablet Take 12.5 mg by mouth daily.       [name] azelastine (ASTELIN) 137 MCG/SPRAY nasal spray Place 2 sprays into both nostrils at bedtime. Use in each nostril as directed      . digoxin (LANOXIN) 0.125 MG tablet Take 1 tablet (0.125 mg total) by mouth daily.  90 tablet  3  . fluticasone (FLONASE) 50 MCG/ACT  nasal spray Place 2 sprays into both nostrils daily.      . furosemide (LASIX) 40 MG tablet Take 0.5 tablets (20 mg total) by mouth daily as needed.  30 tablet    . isosorbide mononitrate (IMDUR) 30 MG 24 hr tablet Take 1 tablet (30 mg total) by mouth daily.  30 tablet  6  . levothyroxine (SYNTHROID) 100 MCG tablet Take 1 tablet (100 mcg total) by mouth daily.  30 tablet  3  . losartan (COZAAR) 25 MG tablet Take 1 tablet (25 mg total) by mouth daily.  90 tablet  3  . Multiple Vitamins-Minerals (PRESERVISION AREDS PO) Take 1 tablet by mouth daily.      . nitroGLYCERIN (NITROSTAT) 0.4 MG SL tablet Place 1 tablet (0.4 mg total) under the tongue every 5 (five) minutes as needed for chest pain.  100 tablet  prn  . pantoprazole (PROTONIX) 40 MG tablet Take 1 tablet (40 mg total) by mouth daily.  90 tablet  3  . pyridoxine (B-6) 200 MG tablet Take 200 mg by mouth daily.        . simvastatin (ZOCOR) 40 MG tablet Take 40 mg by mouth every evening.       No current facility-administered medications for  this visit.    Allergies  Allergen Reactions  . Penicillins     REACTION: ?    Past Medical History  Diagnosis Date  . Unspecified hearing loss   . Hypothyroidism   . Hyperlipidemia   . Esophageal reflux   . Type II or unspecified type diabetes mellitus without mention of complication, not stated as uncontrolled   . CAD (coronary artery disease)     a. 2000 s/p MI/CABG x 4 (VG-PDA, VG-OM, VG-Diag, LIMA-LAD);  b. 2011 MI;  c. 08/2013 NSTEMI/Cath: LM 90-95, LAD 90/100p, LCX 99p, 183m, RCA 100ost, VG-PDA nl, VG-OM 100 (culprit w/ thrombus), VG-Diag nl, LIMA-LAD nl, EF 55-60%, mild inf HK, mod MR-->Med Rx.  . Chronic diastolic CHF (congestive heart failure)     a. 08/2013 Echo: EF 50-55%, no rwma, Gr2 DD, mod MR, mildly dil LA, PASP .  . Atrial fibrillation     a. remote->no anticoagulation 2/2 h/o falls.  . Asthma   . Allergic rhinitis, cause unspecified   . Hypertension   . History of TIA  (transient ischemic attack)   . Carotid arterial disease     a. 05/2013 s/p L CEA.  [name] Anemia     Past Surgical History  Procedure Laterality Date  . Tonsillectomy    . Partial hysterectomy    . Dilation and curettage of uterus    . Bladder tack      x 2  . Rectocele repair      x 2  . Cholecystectomy  10/2001  . Rotator cuff repair    . Coronary artery bypass graft  2000    4 vessel  . Hemorrhoid surgery    . Cardiac catheterization  09/07/1999    EF 25%. THERE IS SEVERE MITRAL ANNULAR CALIFICATION, WITH  3 + INSUFFICIENCY  . US echocardiography  01/17/2005    EF 55-60%  . Cardiovascular stress test  06/30/2007    EF 63%, NO ISCHEMIA  . Eye surgery Bilateral     Cataract  . Endarterectomy Left 05/19/2013    Procedure: ENDARTERECTOMY CAROTID-LEFT;  Surgeon: Sherren Kerns, MD;  Location: [facility] OR;  Service: Vascular;  Laterality: Left;    History  Smoking status  . Never Smoker   Smokeless tobacco  . Never Used    History  Alcohol Use No    Family History  Problem Relation Age of Onset  . Heart attack Father   . Heart disease Son     before age 82    Review of Systems: The review of systems is per the HPI.  All other systems were reviewed and are negative.  Physical Exam: BP 136/52  Pulse 63  Ht 5' 3.5" (1.613 m)  Wt 119 lb (53.978 kg)  BMI 20.75 kg/m2 Patient is very pleasant and in no acute distress. Skin is warm and dry. Color is normal.  HEENT is unremarkable. Normocephalic/atraumatic. PERRL. Sclera are nonicteric. Neck is supple. No masses. No JVD. Lungs are clear. Cardiac exam shows a regular rate and rhythm. Abdomen is soft. Extremities are without edema. Gait and ROM are intact. No gross neurologic deficits noted.  LABORATORY DATA: PENDING  Lab Results  Component Value Date   WBC 9.0 08/12/2013   HGB 10.4* 08/12/2013   HCT 30.4* 08/12/2013   PLT 150 08/12/2013   GLUCOSE 110* 08/17/2013   CHOL 184 08/17/2013   TRIG 260.0* 08/17/2013   HDL 29.00*  08/17/2013   LDLDIRECT 116.1 08/17/2013   LDLCALC 79 08/11/2013  ALT 26 08/17/2013   AST 32 08/17/2013   NA 130* 08/17/2013   K 4.7 08/17/2013   CL 93* 08/17/2013   CREATININE 0.9 08/17/2013   BUN 19 08/17/2013   CO2 29 08/17/2013   TSH 0.082* 08/10/2013   INR 1.10 08/10/2013   HGBA1C 5.6 08/17/2013     Assessment / Plan: 1. Recent NSTEMI with cath showing occlusion of the SVG to the OM and the body of the graft with large thrombus burden - to be managed medically - seems to be doing ok. I think more of her issues are more age related in general. Will see her back in about 6 weeks. She is not going to go to cardiac rehab due to the flu season.   2. HTN - BP has improved - I have left her on her current regimen.   3. Anemia - recheck labs today  4. Prior CEA - still with some numbness over the site - seeing Dr. Oneida Alar back in March -?[facility] - she has no neuro deficits.   I think overall she is ok. Check labs today. See her back in 6 weeks. Defer her return to driving to Dr. Mare Ferrari.   Patient is agreeable to this plan and will call if any problems develop in the interim.   Burtis Junes, RN, [facility] [address] [place], [place]  [id number]

## 2013-09-16 DIAGNOSIS — I509 Heart failure, unspecified: Secondary | ICD-10-CM | POA: Diagnosis not present

## 2013-09-16 DIAGNOSIS — E119 Type 2 diabetes mellitus without complications: Secondary | ICD-10-CM | POA: Diagnosis not present

## 2013-09-16 DIAGNOSIS — I2510 Atherosclerotic heart disease of native coronary artery without angina pectoris: Secondary | ICD-10-CM | POA: Diagnosis not present

## 2013-09-16 DIAGNOSIS — I214 Non-ST elevation (NSTEMI) myocardial infarction: Secondary | ICD-10-CM | POA: Diagnosis not present

## 2013-09-16 DIAGNOSIS — I5030 Unspecified diastolic (congestive) heart failure: Secondary | ICD-10-CM | POA: Diagnosis not present

## 2013-09-16 DIAGNOSIS — K219 Gastro-esophageal reflux disease without esophagitis: Secondary | ICD-10-CM | POA: Diagnosis not present

## 2013-09-17 ENCOUNTER — Telehealth: Payer: Self-pay | Admitting: *Deleted

## 2013-09-17 NOTE — Telephone Encounter (Signed)
Advised patient of lab results  

## 2013-09-17 NOTE — Telephone Encounter (Signed)
Message copied by Earvin Hansen on Fri Sep 17, 2013  3:25 PM ------      Message from: Darlin Coco      Created: Wed Sep 15, 2013  8:19 PM       Please report. Labs stable. Thyroid okay. BUN higher so drink more water ------

## 2013-10-01 DIAGNOSIS — K219 Gastro-esophageal reflux disease without esophagitis: Secondary | ICD-10-CM | POA: Diagnosis not present

## 2013-10-01 DIAGNOSIS — I214 Non-ST elevation (NSTEMI) myocardial infarction: Secondary | ICD-10-CM | POA: Diagnosis not present

## 2013-10-01 DIAGNOSIS — I509 Heart failure, unspecified: Secondary | ICD-10-CM | POA: Diagnosis not present

## 2013-10-01 DIAGNOSIS — I5030 Unspecified diastolic (congestive) heart failure: Secondary | ICD-10-CM | POA: Diagnosis not present

## 2013-10-01 DIAGNOSIS — I2510 Atherosclerotic heart disease of native coronary artery without angina pectoris: Secondary | ICD-10-CM | POA: Diagnosis not present

## 2013-10-01 DIAGNOSIS — E119 Type 2 diabetes mellitus without complications: Secondary | ICD-10-CM | POA: Diagnosis not present

## 2013-10-09 ENCOUNTER — Other Ambulatory Visit: Payer: Self-pay | Admitting: Cardiology

## 2013-10-28 ENCOUNTER — Ambulatory Visit: Payer: Medicare Other | Admitting: Cardiology

## 2013-11-03 ENCOUNTER — Other Ambulatory Visit: Payer: Self-pay | Admitting: Vascular Surgery

## 2013-11-03 DIAGNOSIS — Z48812 Encounter for surgical aftercare following surgery on the circulatory system: Secondary | ICD-10-CM

## 2013-11-03 DIAGNOSIS — I6529 Occlusion and stenosis of unspecified carotid artery: Secondary | ICD-10-CM

## 2013-11-06 ENCOUNTER — Other Ambulatory Visit (HOSPITAL_COMMUNITY): Payer: Self-pay | Admitting: Nurse Practitioner

## 2013-11-08 NOTE — Telephone Encounter (Signed)
refill 

## 2013-11-09 ENCOUNTER — Telehealth: Payer: Self-pay | Admitting: Cardiology

## 2013-11-09 NOTE — Telephone Encounter (Signed)
Spoke with patient and she will keep appointment as scheduled.

## 2013-11-09 NOTE — Telephone Encounter (Signed)
Message copied by Earvin Hansen on Tue Nov 09, 2013  3:48 PM ------      Message from: Santo Held      Created: Tue Nov 09, 2013  9:17 AM      Regarding: Scheduling Issue       Contact: [phone]       Pt called requests to reschedule appt from 03/06 to another day and soon. She is requesting to be fit in given her condition. Offered pt PA or NP, Pt declined. Requests a call back to schedule a soon appt//SR  ------

## 2013-11-12 ENCOUNTER — Ambulatory Visit: Payer: Medicare Other | Admitting: Cardiology

## 2013-11-17 ENCOUNTER — Telehealth: Payer: Self-pay | Admitting: Cardiology

## 2013-11-17 NOTE — Telephone Encounter (Signed)
New message         Pt would like for Jill Howell to call her back asap. Pt would not say the reason.

## 2013-11-17 NOTE — Telephone Encounter (Signed)
Moved up patients appointment as requested

## 2013-11-17 NOTE — Telephone Encounter (Signed)
New message          Pt would like for melinda to call her back asap. Pt would tell the reason.

## 2013-12-01 ENCOUNTER — Ambulatory Visit (INDEPENDENT_AMBULATORY_CARE_PROVIDER_SITE_OTHER): Payer: Medicare Other | Admitting: Cardiology

## 2013-12-01 ENCOUNTER — Encounter: Payer: Self-pay | Admitting: Cardiology

## 2013-12-01 VITALS — BP 136/58 | HR 75 | Ht 63.5 in | Wt 118.0 lb

## 2013-12-01 DIAGNOSIS — I2510 Atherosclerotic heart disease of native coronary artery without angina pectoris: Secondary | ICD-10-CM

## 2013-12-01 DIAGNOSIS — I779 Disorder of arteries and arterioles, unspecified: Secondary | ICD-10-CM

## 2013-12-01 DIAGNOSIS — I4891 Unspecified atrial fibrillation: Secondary | ICD-10-CM

## 2013-12-01 DIAGNOSIS — I119 Hypertensive heart disease without heart failure: Secondary | ICD-10-CM | POA: Diagnosis not present

## 2013-12-01 DIAGNOSIS — R5381 Other malaise: Secondary | ICD-10-CM | POA: Diagnosis not present

## 2013-12-01 DIAGNOSIS — I739 Peripheral vascular disease, unspecified: Secondary | ICD-10-CM

## 2013-12-01 DIAGNOSIS — I480 Paroxysmal atrial fibrillation: Secondary | ICD-10-CM

## 2013-12-01 DIAGNOSIS — G459 Transient cerebral ischemic attack, unspecified: Secondary | ICD-10-CM | POA: Diagnosis not present

## 2013-12-01 DIAGNOSIS — R5383 Other fatigue: Principal | ICD-10-CM

## 2013-12-01 LAB — CBC WITH DIFFERENTIAL/PLATELET
BASOS ABS: 0 10*3/uL (ref 0.0–0.1)
Basophils Relative: 0.5 % (ref 0.0–3.0)
EOS PCT: 2.4 % (ref 0.0–5.0)
Eosinophils Absolute: 0.2 10*3/uL (ref 0.0–0.7)
HEMATOCRIT: 35.6 % — AB (ref 36.0–46.0)
Hemoglobin: 11.9 g/dL — ABNORMAL LOW (ref 12.0–15.0)
Lymphocytes Relative: 34 % (ref 12.0–46.0)
Lymphs Abs: 3 10*3/uL (ref 0.7–4.0)
MCHC: 33.4 g/dL (ref 30.0–36.0)
MCV: 86.2 fl (ref 78.0–100.0)
MONOS PCT: 12.2 % — AB (ref 3.0–12.0)
Monocytes Absolute: 1.1 10*3/uL — ABNORMAL HIGH (ref 0.1–1.0)
NEUTROS PCT: 50.9 % (ref 43.0–77.0)
Neutro Abs: 4.4 10*3/uL (ref 1.4–7.7)
PLATELETS: 186 10*3/uL (ref 150.0–400.0)
RBC: 4.14 Mil/uL (ref 3.87–5.11)
RDW: 14.2 % (ref 11.5–14.6)
WBC: 8.7 10*3/uL (ref 4.5–10.5)

## 2013-12-01 LAB — BASIC METABOLIC PANEL
BUN: 30 mg/dL — ABNORMAL HIGH (ref 6–23)
CALCIUM: 9.3 mg/dL (ref 8.4–10.5)
CO2: 26 meq/L (ref 19–32)
Chloride: 99 mEq/L (ref 96–112)
Creatinine, Ser: 0.9 mg/dL (ref 0.4–1.2)
GFR: 63.74 mL/min (ref >60.00)
Glucose, Bld: 100 mg/dL — ABNORMAL HIGH (ref 70–99)
POTASSIUM: 4.6 meq/L (ref 3.5–5.1)
SODIUM: 133 meq/L — AB (ref 135–145)

## 2013-12-01 LAB — TSH: TSH: 0.21 u[IU]/mL — ABNORMAL LOW (ref 0.35–5.50)

## 2013-12-01 NOTE — Assessment & Plan Note (Signed)
The patient has had no recurrent TIA symptoms.

## 2013-12-01 NOTE — Assessment & Plan Note (Signed)
The patient has not been experiencing any recurrent chest pain or angina.  She tries to walk 10 laps in her house once or twice a day for exercise.  She has not been having to take any recent sublingual nitroglycerin

## 2013-12-01 NOTE — Progress Notes (Signed)
Jill Howell Date of Birth:  [birth year] [address] [place], [place]  [address] [phone]         Fax   [phone]  History of Present Illness: This pleasant 80 year old woman is seen for a post hospital followup office visit. She has a history of ischemic heart disease. She had coronary artery bypass graft surgery in 2000. She has a history of diastolic congestive heart failure and high cholesterol. She has had GERD and is on Protonix. She has had a history of hypothyroidism. She has a known left carotid bruit.  She had successful left carotid endarterectomy on 05/19/2013 by Dr. Oneida Alar.   She has had no subsequent TIA symptoms. She states that she is a diabetic although her blood sugars here in the office have been in the range of 100-102. She watches her diet. She is on no diabetic medicine.   on 08/10/13 the patient presented to the office with history of crescendo angina.  She was hospitalized emergently.  He underwent left heart cardiac catheterization by Dr. Martinique which showed that the saphenous vein graft to the obtuse marginal was occluded and the body of the graft with a large thrombus burden she was found to have a patent LIMA to the LAD, patent saphenous vein graft to the diagonal, and patent saphenous vein graft to the posterior descending.  She had good LV function and showed moderate 2+ mitral regurgitation.  Patient had an echocardiogram on 08/11/13 which showed ejection fraction in the range of 50-55% with no regional wall motion abnormalities and she did have grade 2 diastolic dysfunction.  Her pulmonary artery pressure was elevated at 51.  The patient did well post catheter.  Her troponin peaked at 6.44.  She was not a candidate for PCI and medical therapy was recommended.  Because of her history of falls Plavix therapy was not initiated.  Long-acting nitrate therapy was added to her regimen and she will continue on aspirin beta blocker and statin.  Home physical  therapy is seeing her.  Current Outpatient Prescriptions  Medication Sig Dispense Refill  . Ascorbic Acid (VITAMIN C) 1000 MG tablet Take 500 mg by mouth daily.      [name] aspirin 81 MG EC tablet Take 81 mg by mouth daily.        [name] atenolol (TENORMIN) 25 MG tablet Take 12.5 mg by mouth daily.       [name] azelastine (ASTELIN) 137 MCG/SPRAY nasal spray Place 2 sprays into both nostrils at bedtime. Use in each nostril as directed      . digoxin (LANOXIN) 0.125 MG tablet Take 1 tablet (0.125 mg total) by mouth daily.  90 tablet  3  . fluticasone (FLONASE) 50 MCG/ACT nasal spray Place 2 sprays into both nostrils daily.      . furosemide (LASIX) 40 MG tablet Take 0.5 tablets (20 mg total) by mouth daily as needed.  30 tablet    . isosorbide mononitrate (IMDUR) 30 MG 24 hr tablet Take 1 tablet (30 mg total) by mouth daily.  30 tablet  6  . levothyroxine (SYNTHROID, LEVOTHROID) 100 MCG tablet TAKE 1 TABLET (100 MCG TOTAL) BY MOUTH DAILY.  30 tablet  1  . losartan (COZAAR) 25 MG tablet Take 1 tablet (25 mg total) by mouth daily.  90 tablet  3  . Multiple Vitamins-Minerals (PRESERVISION AREDS PO) Take 1 tablet by mouth daily.      . nitroGLYCERIN (NITROSTAT) 0.4 MG SL tablet Place 1 tablet (  0.4 mg total) under the tongue every 5 (five) minutes as needed for chest pain.  100 tablet  prn  . pantoprazole (PROTONIX) 40 MG tablet Take 1 tablet (40 mg total) by mouth daily.  90 tablet  3  . pyridoxine (B-6) 200 MG tablet Take 200 mg by mouth daily.        . simvastatin (ZOCOR) 40 MG tablet Take 40 mg by mouth every evening.      . simvastatin (ZOCOR) 40 MG tablet TAKE 1 TABLET (40 MG TOTAL) BY MOUTH EVERY EVENING.  90 tablet  0   No current facility-administered medications for this visit.    Allergies  Allergen Reactions  . Penicillins     REACTION: ?    Patient Active Problem List   Diagnosis Date Noted  . Malaise and fatigue 12/01/2013  . Benign hypertensive heart disease without heart failure 08/17/2013   . At risk for falls 08/12/2013  . Anemia   . Unstable angina 08/10/2013  . NSTEMI (non-ST elevated myocardial infarction) 08/10/2013  . TIA (transient ischemic attack) 05/17/2013  . Occlusion and stenosis of carotid artery without mention of cerebral infarction 05/13/2013  . Fall at home 05/11/2013  . Leg pain 08/11/2012  . PAF (paroxysmal atrial fibrillation) 08/11/2012  . Back pain 05/07/2012  . Carotid artery disease 08/19/2011  . Dermatitis 05/09/2011  . UTI (lower urinary tract infection) 03/21/2011  . HYPOTHYROIDISM 09/15/2007  . DIABETES MELLITUS, TYPE II 09/15/2007  . HYPERLIPIDEMIA 09/15/2007  . HEARING IMPAIRMENT 09/15/2007  . CORONARY ARTERY DISEASE 09/15/2007  . CONGESTIVE HEART FAILURE 09/15/2007  . ALLERGIC RHINITIS 09/15/2007  . Unspecified asthma(493.90) 09/15/2007  . GERD 09/15/2007    History  Smoking status  . Never Smoker   Smokeless tobacco  . Never Used    History  Alcohol Use No    Family History  Problem Relation Age of Onset  . Heart attack Father   . Heart disease Son     before age 70    Review of Systems: Constitutional: no fever chills diaphoresis or fatigue or change in weight.  Head and neck: no hearing loss, no epistaxis, no photophobia or visual disturbance. Respiratory: No cough, shortness of breath or wheezing. Cardiovascular: No chest pain peripheral edema, palpitations. Gastrointestinal: No abdominal distention, no abdominal pain, no change in bowel habits hematochezia or melena. Genitourinary: No dysuria, no frequency, no urgency, no nocturia. Musculoskeletal:No arthralgias, no back pain, no gait disturbance or myalgias. Neurological: No dizziness, no headaches, no numbness, no seizures, no syncope, no weakness, no tremors. Hematologic: No lymphadenopathy, no easy bruising. Psychiatric: No confusion, no hallucinations, no sleep disturbance.    Physical Exam: Filed Vitals:   12/01/13 1105  BP: 136/58  Pulse: 75   the  general appearance reveals a thin elderly alert woman in no distress.The head and neck exam reveals pupils equal and reactive.  Extraocular movements are full.  There is no scleral icterus.  The mouth and pharynx are normal.  The neck is supple.  The carotids reveal no bruits.  The jugular venous pressure is normal.  The  thyroid is not enlarged.  There is no lymphadenopathy.  The chest is clear to percussion and auscultation.  There are no rales or rhonchi.  Expansion of the chest is symmetrical.  The precordium is quiet.  The first heart sound is normal.  The second heart sound is physiologically split.  There is no murmur gallop rub or click.  There is no abnormal lift or heave.  The abdomen is soft and nontender.  The bowel sounds are normal.  The liver and spleen are not enlarged.  There are no abdominal masses.  There are no abdominal bruits.  Extremities reveal good pedal pulses.  There is no phlebitis or edema.  There is no cyanosis or clubbing.  Strength is normal and symmetrical in all extremities.  There is no lateralizing weakness.  There are no sensory deficits.  The skin is warm and dry.  There is no rash.     Assessment / Plan: The patient is to continue same medication.   Recheck in 3 months for followup office visit.

## 2013-12-01 NOTE — Assessment & Plan Note (Signed)
The patient has not had any recurrent atrial fibrillation. 

## 2013-12-01 NOTE — Assessment & Plan Note (Signed)
The patient complains of weakness and fatigue.  She is still under a lot of stress at home regarding an ongoing dispute with one of her sons.  She has been in contact with the local sheriff. We are checking basal metabolic panel, CBC, and TSH today to look for other causes of malaise and fatigue although I suspect that ongoing emotional stress is the main problem.

## 2013-12-01 NOTE — Patient Instructions (Signed)
Will obtain labs today and call you with the results (BMET/TSH/CBC)  Your physician recommends that you continue on your current medications as directed. Please refer to the Current Medication list given to you today.  Your physician recommends that you schedule a follow-up appointment in: ABOUT 3 MONTHS

## 2013-12-06 ENCOUNTER — Encounter: Payer: Self-pay | Admitting: Family

## 2013-12-07 ENCOUNTER — Encounter: Payer: Self-pay | Admitting: Family

## 2013-12-07 ENCOUNTER — Ambulatory Visit (INDEPENDENT_AMBULATORY_CARE_PROVIDER_SITE_OTHER): Payer: Medicare Other | Admitting: Family

## 2013-12-07 ENCOUNTER — Ambulatory Visit (HOSPITAL_COMMUNITY)
Admission: RE | Admit: 2013-12-07 | Discharge: 2013-12-07 | Disposition: A | Discharge status: Home / Self Care | Payer: Medicare Other | Source: Ambulatory Visit | Attending: Family | Admitting: Family

## 2013-12-07 VITALS — BP 148/71 | HR 72 | Resp 16 | Ht 63.5 in | Wt 121.4 lb

## 2013-12-07 DIAGNOSIS — Z48812 Encounter for surgical aftercare following surgery on the circulatory system: Secondary | ICD-10-CM | POA: Diagnosis not present

## 2013-12-07 DIAGNOSIS — I6529 Occlusion and stenosis of unspecified carotid artery: Secondary | ICD-10-CM | POA: Insufficient documentation

## 2013-12-07 NOTE — Addendum Note (Signed)
Addended by: Mena Goes on: 12/07/2013 05:02 PM   Modules accepted: Orders

## 2013-12-07 NOTE — Progress Notes (Signed)
Established Carotid Patient   History of Present Illness  Jill Howell is a 80 y.o. female patient of Dr. Oneida Alar who is s/p left CEA on 05/19/2013 for greater than 80% stenosis of the left ICA. She returns today for follow up.  She had transient left arm numbness, expressive aphasia before the CEA, and a milder form of transient left arm numbness after the left CEA, these symptoms have resolved. She denies claudication symptoms with walking. She denies monocular blindness. She feels like both lower legs are weak lately.  She had an MI last December, 2014.   Pt Diabetic: Yes, diet controlled Pt smoker: non-smoker  Pt meds include: Statin : Yes ASA: Yes Other anticoagulants/antiplatelets: no   Past Medical History  Diagnosis Date  . Unspecified hearing loss   . Hypothyroidism   . Hyperlipidemia   . Esophageal reflux   . Type II or unspecified type diabetes mellitus without mention of complication, not stated as uncontrolled   . CAD (coronary artery disease)     a. 2000 s/p MI/CABG x 4 (VG-PDA, VG-OM, VG-Diag, LIMA-LAD);  b. 2011 MI;  c. 08/2013 NSTEMI/Cath: LM 90-95, LAD 90/100p, LCX 99p, 128m, RCA 100ost, VG-PDA nl, VG-OM 100 (culprit w/ thrombus), VG-Diag nl, LIMA-LAD nl, EF 55-60%, mild inf HK, mod MR-->Med Rx.  . Chronic diastolic CHF (congestive heart failure)     a. 08/2013 Echo: EF 50-55%, no rwma, Gr2 DD, mod MR, mildly dil LA, PASP 38mmHg.  . Atrial fibrillation     a. remote->no anticoagulation 2/2 h/o falls.  . Asthma   . Allergic rhinitis, cause unspecified   . Hypertension   . History of TIA (transient ischemic attack)   . Carotid arterial disease     a. 05/2013 s/p L CEA.  [name] Anemia     Social History History  Substance Use Topics  . Smoking status: Never Smoker   . Smokeless tobacco: Never Used  . Alcohol Use: No    Family History Family History  Problem Relation Age of Onset  . Heart attack Father   . Heart disease Son     before age 33     Surgical History Past Surgical History  Procedure Laterality Date  . Tonsillectomy    . Partial hysterectomy    . Dilation and curettage of uterus    . Bladder tack      x 2  . Rectocele repair      x 2  . Cholecystectomy  10/2001  . Rotator cuff repair    . Coronary artery bypass graft  2000    4 vessel  . Hemorrhoid surgery    . Cardiac catheterization  09/07/1999    EF 25%. THERE IS SEVERE MITRAL ANNULAR CALIFICATION, WITH  3 + INSUFFICIENCY  . US echocardiography  01/17/2005    EF 55-60%  . Cardiovascular stress test  06/30/2007    EF 63%, NO ISCHEMIA  . Eye surgery Bilateral     Cataract  . Endarterectomy Left 05/19/2013    Procedure: ENDARTERECTOMY CAROTID-LEFT;  Surgeon: Elam Dutch, MD;  Location: [facility];  Service: Vascular;  Laterality: Left;    Allergies  Allergen Reactions  . Penicillins     REACTION: ?    Current Outpatient Prescriptions  Medication Sig Dispense Refill  . Ascorbic Acid (VITAMIN C) 1000 MG tablet Take 500 mg by mouth daily.      [name] aspirin 81 MG EC tablet Take 81 mg by mouth daily.        [name]  atenolol (TENORMIN) 25 MG tablet Take 12.5 mg by mouth daily.       [name] azelastine (ASTELIN) 137 MCG/SPRAY nasal spray Place 2 sprays into both nostrils at bedtime. Use in each nostril as directed      . digoxin (LANOXIN) 0.125 MG tablet Take 1 tablet (0.125 mg total) by mouth daily.  90 tablet  3  . fluticasone (FLONASE) 50 MCG/ACT nasal spray Place 2 sprays into both nostrils daily.      . furosemide (LASIX) 40 MG tablet Take 0.5 tablets (20 mg total) by mouth daily as needed.  30 tablet    . isosorbide mononitrate (IMDUR) 30 MG 24 hr tablet Take 1 tablet (30 mg total) by mouth daily.  30 tablet  6  . levothyroxine (SYNTHROID, LEVOTHROID) 100 MCG tablet TAKE 1 TABLET (100 MCG TOTAL) BY MOUTH DAILY.  30 tablet  1  . losartan (COZAAR) 25 MG tablet Take 1 tablet (25 mg total) by mouth daily.  90 tablet  3  . Multiple Vitamins-Minerals (PRESERVISION AREDS  PO) Take 1 tablet by mouth daily.      . nitroGLYCERIN (NITROSTAT) 0.4 MG SL tablet Place 1 tablet (0.4 mg total) under the tongue every 5 (five) minutes as needed for chest pain.  100 tablet  prn  . pantoprazole (PROTONIX) 40 MG tablet Take 1 tablet (40 mg total) by mouth daily.  90 tablet  3  . pyridoxine (B-6) 200 MG tablet Take 200 mg by mouth daily.        . simvastatin (ZOCOR) 40 MG tablet Take 40 mg by mouth every evening.      . simvastatin (ZOCOR) 40 MG tablet TAKE 1 TABLET (40 MG TOTAL) BY MOUTH EVERY EVENING.  90 tablet  0   No current facility-administered medications for this visit.    Review of Systems : See HPI for pertinent positives and negatives.  Physical Examination  Filed Vitals:   12/07/13 1429  BP: 148/71  Pulse: 72  Resp: 16   Filed Weights   12/07/13 1429  Weight: 121 lb 6.4 oz (55.067 kg)   Body mass index is 21.17 kg/(m^2).  General: WDWN female in NAD GAIT: normal Eyes: PERRLA Pulmonary:  Non-labored, CTAB, Negative  Rales, Negative rhonchi, & Negative wheezing.  Cardiac: regular Rhythm ,  Positive murmur.  VASCULAR EXAM Carotid Bruits Left Right   Positive Negative    Aorta is not palpable. Radial pulses are 2+ palpable and equal.                                                                                                                            LE Pulses LEFT RIGHT       POPLITEAL  not palpable   not palpable       POSTERIOR TIBIAL  not palpable   not palpable        DORSALIS PEDIS      ANTERIOR TIBIAL  palpable   palpable  Gastrointestinal: soft, nontender, BS WNL, no r/g,  negative masses.  Musculoskeletal: Negative muscle atrophy/wasting. M/S 5/5 in U/E's, 4/5 in LE's, Extremities without ischemic changes.  Neurologic: A&O X 3; Appropriate Affect ; SENSATION ;normal except for diminished sensation left mandible area, normal sensation left CEA incision;  Speech is normal CN 2-12 intact, Pain and light touch intact in  extremities, Motor exam as listed above.   Non-Invasive Vascular Imaging CAROTID DUPLEX 12/07/2013   CEREBROVASCULAR DUPLEX EVALUATION    INDICATION: Follow-up carotid disease     PREVIOUS INTERVENTION(S): Left carotid endarterectomy 05/19/2013    DUPLEX EXAM:     RIGHT  LEFT  Peak Systolic Velocities (cm/s) End Diastolic Velocities (cm/s) Plaque LOCATION Peak Systolic Velocities (cm/s) End Diastolic Velocities (cm/s) Plaque  89 11  CCA PROXIMAL 89 4   84 14  CCA MID 111 16   89 12  CCA DISTAL 128 12   203 0 HT ECA 284 0   203 26 HT ICA PROXIMAL 68 13   139 23  ICA MID 142 21   91 15  ICA DISTAL 105 17     2.3 ICA / CCA Ratio (PSV) NA  Antegrade  Vertebral Flow Antegrade   790 Brachial Systolic Pressure (mmHg) 240  Within normal limits  Brachial Artery Waveforms Within normal limits     Plaque Morphology:  HM = Homogeneous, HT = Heterogeneous, CP = Calcific Plaque, SP = Smooth Plaque, IP = Irregular Plaque     ADDITIONAL FINDINGS:     IMPRESSION: 1. Evidence of <40% stenosis of the right internal carotid artery. 2. Widely patent left carotid endarterectomy without evidence of restenosis or hyperplasia.  3. Left external carotid artery stenosis/ hyperplasia. 4. Bilateral vertebral artery is antegrade.    Compared to the previous exam:  No previous exam at this facility for comparison.     Assessment: Jill Howell is a 80 y.o. female who presents with asymptomatic minimal left ICA stenosis and widely patent left ICA which is the CEA site.  Plan: Follow-up in 6 months with Carotid Duplex scan.   I discussed in depth with the patient the nature of atherosclerosis, and emphasized the importance of maximal medical management including strict control of blood pressure, blood glucose, and lipid levels, obtaining regular exercise, and continued cessation of smoking.  The patient is aware that without maximal medical management the underlying atherosclerotic disease process will  progress, limiting the benefit of any interventions. The patient was given information about stroke prevention and what symptoms should prompt the patient to seek immediate medical care. Thank you for allowing [place] to participate in this patient's care.  Clemon Chambers, RN, MSN, FNP-C [facility] Office: [phone]  Clinic Physician: Early  12/07/2013 2:12 PM

## 2013-12-07 NOTE — Patient Instructions (Signed)
Stroke Prevention Some medical conditions and behaviors are associated with an increased chance of having a stroke. You [place] prevent a stroke by making healthy choices and managing medical conditions. HOW CAN I REDUCE MY RISK OF HAVING A STROKE?   Stay physically active. Get at least 30 minutes of activity on most or all days.  Do not smoke. It [place] also be helpful to avoid exposure to secondhand smoke.  Limit alcohol use. Moderate alcohol use is considered to be:  No more than 2 drinks per day for men.  No more than 1 drink per day for nonpregnant women.  Eat healthy foods. This involves  Eating 5 or more servings of fruits and vegetables a day.  Following a diet that addresses high blood pressure (hypertension), high cholesterol, diabetes, or obesity.  Manage your cholesterol levels.  A diet low in saturated fat, trans fat, and cholesterol and high in fiber [place] control cholesterol levels.  Take any prescribed medicines to control cholesterol as directed by your health care provider.  Manage your diabetes.  A controlled-carbohydrate, controlled-sugar diet is recommended to manage diabetes.  Take any prescribed medicines to control diabetes as directed by your health care provider.  Control your hypertension.  A low-salt (sodium), low-saturated fat, low-trans fat, and low-cholesterol diet is recommended to manage hypertension.  Take any prescribed medicines to control hypertension as directed by your health care provider.  Maintain a healthy weight.  A reduced-calorie, low-sodium, low-saturated fat, low-trans fat, low-cholesterol diet is recommended to manage weight.  Stop drug abuse.  Avoid taking birth control pills.  Talk to your health care provider about the risks of taking birth control pills if you are over 35 years old, smoke, get migraines, or have ever had a blood clot.  Get evaluated for sleep disorders (sleep apnea).  Talk to your health care provider about  getting a sleep evaluation if you snore a lot or have excessive sleepiness.  Take medicines as directed by your health care provider.  For some people, aspirin or blood thinners (anticoagulants) are helpful in reducing the risk of forming abnormal blood clots that can lead to stroke. If you have the irregular heart rhythm of atrial fibrillation, you should be on a blood thinner unless there is a good reason you cannot take them.  Understand all your medicine instructions.  Make sure that other other conditions (such as anemia or atherosclerosis) are addressed. SEEK IMMEDIATE MEDICAL CARE IF:   You have sudden weakness or numbness of the face, arm, or leg, especially on one side of the body.  Your face or eyelid droops to one side.  You have sudden confusion.  You have trouble speaking (aphasia) or understanding.  You have sudden trouble seeing in one or both eyes.  You have sudden trouble walking.  You have dizziness.  You have a loss of balance or coordination.  You have a sudden, severe headache with no known cause.  You have new chest pain or an irregular heartbeat. Any of these symptoms [place] represent a serious problem that is an emergency. Do not wait to see if the symptoms will go away. Get medical help at once. Call your local emergency services  (911 in [place] Do not drive yourself to the hospital. Document Released: 10/03/2004 Document Revised: 06/16/2013 Document Reviewed: 02/26/2013 [employer] Patient Information 2014 [employer].  

## 2013-12-08 ENCOUNTER — Other Ambulatory Visit: Payer: Self-pay | Admitting: Cardiology

## 2014-01-05 ENCOUNTER — Other Ambulatory Visit (HOSPITAL_COMMUNITY): Payer: Self-pay | Admitting: Cardiology

## 2014-01-13 ENCOUNTER — Ambulatory Visit: Payer: Medicare Other | Admitting: Cardiology

## 2014-01-24 ENCOUNTER — Other Ambulatory Visit: Payer: Self-pay | Admitting: Cardiology

## 2014-01-26 DIAGNOSIS — E119 Type 2 diabetes mellitus without complications: Secondary | ICD-10-CM | POA: Diagnosis not present

## 2014-02-04 ENCOUNTER — Other Ambulatory Visit (HOSPITAL_COMMUNITY): Payer: Self-pay | Admitting: Nurse Practitioner

## 2014-02-08 ENCOUNTER — Other Ambulatory Visit: Payer: Self-pay | Admitting: Cardiology

## 2014-02-17 ENCOUNTER — Ambulatory Visit (INDEPENDENT_AMBULATORY_CARE_PROVIDER_SITE_OTHER): Payer: Medicare Other | Admitting: Family Medicine

## 2014-02-17 ENCOUNTER — Encounter: Payer: Self-pay | Admitting: Family Medicine

## 2014-02-17 VITALS — BP 152/60 | HR 72 | Temp 97.6°F | Wt 121.5 lb

## 2014-02-17 DIAGNOSIS — W57XXXA Bitten or stung by nonvenomous insect and other nonvenomous arthropods, initial encounter: Secondary | ICD-10-CM

## 2014-02-17 DIAGNOSIS — I2510 Atherosclerotic heart disease of native coronary artery without angina pectoris: Secondary | ICD-10-CM

## 2014-02-17 DIAGNOSIS — Z639 Problem related to primary support group, unspecified: Secondary | ICD-10-CM | POA: Diagnosis not present

## 2014-02-17 DIAGNOSIS — T148 Other injury of unspecified body region: Secondary | ICD-10-CM | POA: Diagnosis not present

## 2014-02-17 DIAGNOSIS — Z638 Other specified problems related to primary support group: Secondary | ICD-10-CM

## 2014-02-17 NOTE — Patient Instructions (Addendum)
Take care.  Glad to see you.   If you have a fever or a new rash, then notify me.

## 2014-02-17 NOTE — Progress Notes (Signed)
Pre visit review using our clinic review tool, if applicable. No additional management support is needed unless otherwise documented below in the visit note.  She was concerned that her son Josph Macho) had mixed up her BP meds prev.  She has cut off communications with him in the meantime.  She says she is safe at home in the meantime.    She had a possible tick bite.  She found it on her arm, not attached.  No fevers, no chills.  No rash.  She has a possible tick bite site on the L forearm.  She brought in the tick and it wasn't engorged.    Meds, vitals, and allergies reviewed.   ROS: See HPI.  Otherwise, noncontributory.  nad rrr ctab Abd soft Ext w/o edema Two <1cm blanching faint red spots on the L forearm.

## 2014-02-18 DIAGNOSIS — W57XXXA Bitten or stung by nonvenomous insect and other nonvenomous arthropods, initial encounter: Secondary | ICD-10-CM | POA: Insufficient documentation

## 2014-02-18 DIAGNOSIS — Z638 Other specified problems related to primary support group: Secondary | ICD-10-CM | POA: Insufficient documentation

## 2014-02-18 NOTE — Assessment & Plan Note (Signed)
Possible, not a clear bite. Doesn't meet criteria for abx proph with doxy at this point.  Routine cautions given to patient, call back as needed.

## 2014-02-18 NOTE — Assessment & Plan Note (Signed)
She was concerned that her son Josph Macho) had mixed up her BP meds prev. She has cut off communications with him in the meantime. She says she is safe at home in the meantime.

## 2014-02-23 ENCOUNTER — Ambulatory Visit (INDEPENDENT_AMBULATORY_CARE_PROVIDER_SITE_OTHER): Payer: Medicare Other | Admitting: Cardiology

## 2014-02-23 ENCOUNTER — Encounter: Payer: Self-pay | Admitting: Cardiology

## 2014-02-23 VITALS — BP 132/61 | HR 71 | Ht 63.5 in | Wt 121.0 lb

## 2014-02-23 DIAGNOSIS — I119 Hypertensive heart disease without heart failure: Secondary | ICD-10-CM

## 2014-02-23 DIAGNOSIS — I2510 Atherosclerotic heart disease of native coronary artery without angina pectoris: Secondary | ICD-10-CM

## 2014-02-23 DIAGNOSIS — IMO0001 Reserved for inherently not codable concepts without codable children: Secondary | ICD-10-CM | POA: Diagnosis not present

## 2014-02-23 DIAGNOSIS — I480 Paroxysmal atrial fibrillation: Secondary | ICD-10-CM

## 2014-02-23 DIAGNOSIS — I4891 Unspecified atrial fibrillation: Secondary | ICD-10-CM | POA: Diagnosis not present

## 2014-02-23 DIAGNOSIS — I509 Heart failure, unspecified: Secondary | ICD-10-CM

## 2014-02-23 DIAGNOSIS — E7800 Pure hypercholesterolemia, unspecified: Secondary | ICD-10-CM | POA: Diagnosis not present

## 2014-02-23 DIAGNOSIS — I779 Disorder of arteries and arterioles, unspecified: Secondary | ICD-10-CM

## 2014-02-23 DIAGNOSIS — I739 Peripheral vascular disease, unspecified: Secondary | ICD-10-CM

## 2014-02-23 DIAGNOSIS — E1165 Type 2 diabetes mellitus with hyperglycemia: Secondary | ICD-10-CM

## 2014-02-23 MED ORDER — FUROSEMIDE 40 MG PO TABS: 20.0000 mg | ORAL_TABLET | Freq: Every day | ORAL | Status: DC

## 2014-02-23 MED ORDER — FUROSEMIDE 20 MG PO TABS: 20.0000 mg | ORAL_TABLET | Freq: Every day | ORAL | Status: DC

## 2014-02-23 NOTE — Assessment & Plan Note (Signed)
The patient is a recurrent TIA or stroke symptoms.

## 2014-02-23 NOTE — Patient Instructions (Addendum)
Your physician has recommended you make the following change in your medication: START LASIX ( 20 MG ) DAILY, [facility]  Your physician recommends that you KEEP YOUR scheduled  follow-up appointment with Dr. Mare Ferrari in September

## 2014-02-23 NOTE — Assessment & Plan Note (Signed)
Her blood pressure has been stable on current therapy

## 2014-02-23 NOTE — Assessment & Plan Note (Addendum)
The patient has a history of diastolic heart failure.  She has had some exertional dyspnea. Recently she has not been taking her Lasix.  Her weight is up 3 pounds and she has rales at the right base.  I have advised her to resume taking one half tablet Lasix daily.
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
## 2014-03-04 ENCOUNTER — Other Ambulatory Visit: Payer: Self-pay | Admitting: Cardiology

## 2014-03-07 ENCOUNTER — Other Ambulatory Visit: Payer: Self-pay | Admitting: Cardiology

## 2014-03-07 ENCOUNTER — Other Ambulatory Visit (HOSPITAL_COMMUNITY): Payer: Self-pay | Admitting: Cardiology

## 2014-03-09 DIAGNOSIS — R262 Difficulty in walking, not elsewhere classified: Secondary | ICD-10-CM | POA: Diagnosis not present

## 2014-03-24 ENCOUNTER — Ambulatory Visit (INDEPENDENT_AMBULATORY_CARE_PROVIDER_SITE_OTHER)
Admission: RE | Admit: 2014-03-24 | Discharge: 2014-03-24 | Disposition: A | Discharge status: Home / Self Care | Payer: Medicare Other | Source: Ambulatory Visit | Attending: Family Medicine | Admitting: Family Medicine

## 2014-03-24 ENCOUNTER — Ambulatory Visit (INDEPENDENT_AMBULATORY_CARE_PROVIDER_SITE_OTHER): Payer: Medicare Other | Admitting: Family Medicine

## 2014-03-24 ENCOUNTER — Encounter: Payer: Self-pay | Admitting: Family Medicine

## 2014-03-24 VITALS — BP 128/50 | HR 73 | Temp 97.6°F | Wt 120.5 lb

## 2014-03-24 DIAGNOSIS — R059 Cough, unspecified: Secondary | ICD-10-CM

## 2014-03-24 DIAGNOSIS — Y92009 Unspecified place in unspecified non-institutional (private) residence as the place of occurrence of the external cause: Secondary | ICD-10-CM

## 2014-03-24 DIAGNOSIS — R05 Cough: Secondary | ICD-10-CM

## 2014-03-24 DIAGNOSIS — W19XXXA Unspecified fall, initial encounter: Secondary | ICD-10-CM

## 2014-03-24 DIAGNOSIS — I2510 Atherosclerotic heart disease of native coronary artery without angina pectoris: Secondary | ICD-10-CM | POA: Diagnosis not present

## 2014-03-24 DIAGNOSIS — Z9181 History of falling: Secondary | ICD-10-CM

## 2014-03-24 DIAGNOSIS — J449 Chronic obstructive pulmonary disease, unspecified: Secondary | ICD-10-CM | POA: Insufficient documentation

## 2014-03-24 DIAGNOSIS — J4489 Other specified chronic obstructive pulmonary disease: Secondary | ICD-10-CM | POA: Insufficient documentation

## 2014-03-24 MED ORDER — BENZONATATE 200 MG PO CAPS: 200.0000 mg | ORAL_CAPSULE | Freq: Three times a day (TID) | ORAL | Status: DC | PRN

## 2014-03-24 NOTE — Progress Notes (Signed)
Pre visit review using our clinic review tool, if applicable. No additional management support is needed unless otherwise documented below in the visit note.  Cough has continued since last fall.  She'll cough up some phlegm in the AM and throughout the day.  Occ the sputum is greenish but this isn't consistent. No fevers.  She wanted to avoid abx if at all possible.  D/w pt.  Taking OTC guaifenesin/dextromethorphan currently.    She fell Friday at [facility], getting up to go to [place].  No syncope.  Legs felt weak and went down.  No LOC with the fall.  She used her walker to get up.  Still living alone.  She has an alarm that she can keep in her pocket if she falls.    PMH and SH reviewed  ROS: See HPI, otherwise noncontributory.  Meds, vitals, and allergies reviewed.   nad ncat Nasal and OP exam wnl Neck supple, no LA rrr No focal dec in BS, no cough during exam, no wheeze Abd soft, not ttp Ext w/o edema

## 2014-03-24 NOTE — Assessment & Plan Note (Signed)
D/w pt re: safety precautions.  Referral to PT is in.

## 2014-03-24 NOTE — Assessment & Plan Note (Signed)
See notes on cxr, image reviewed.  No wheeze today.  I would like to avoid SABA given her h/o PAF and cardiac hx.  I don't see an infiltrate to treat with abx at this point.  I doubt the ARB is causing this.  I would like her to see pulmonary given the duration of the cough.  Will have her try tessalon in the meantime, as that is low risk and [place] help.  She isn't in distress.

## 2014-03-24 NOTE — Patient Instructions (Signed)
Keep taking your cough medicine for now.  Go to the lab on the way out.  We'll contact you with your xray report. Rosaria Ferries will call about your referral for physical therapy.  Take care.

## 2014-03-30 ENCOUNTER — Telehealth: Payer: Self-pay | Admitting: Internal Medicine

## 2014-03-30 NOTE — Telephone Encounter (Signed)
Called and spoke to pt. Pt stated she has been having and increase in fatigue, dyspnea, productive cough with clear/white mucous, chest soreness d/t coughing and pain under her right breast. Pt denies PND and swelling. Pt taking prescribed medication as directed. Pt unsure how long s/s have been occurring. Pt was requesting to be seen tomorrow. Appt made for CY at 9am on 03/31/14, pt confirmed appt time and date. Nothing further needed.

## 2014-03-31 ENCOUNTER — Encounter: Payer: Self-pay | Admitting: Internal Medicine

## 2014-03-31 ENCOUNTER — Ambulatory Visit (INDEPENDENT_AMBULATORY_CARE_PROVIDER_SITE_OTHER): Payer: Medicare Other | Admitting: Internal Medicine

## 2014-03-31 VITALS — BP 140/68 | HR 83 | Ht 64.0 in | Wt 120.0 lb

## 2014-03-31 DIAGNOSIS — R059 Cough, unspecified: Secondary | ICD-10-CM

## 2014-03-31 DIAGNOSIS — I2510 Atherosclerotic heart disease of native coronary artery without angina pectoris: Secondary | ICD-10-CM

## 2014-03-31 DIAGNOSIS — R05 Cough: Secondary | ICD-10-CM | POA: Diagnosis not present

## 2014-03-31 NOTE — Progress Notes (Signed)
Subjective:    Patient ID: Jill Howell, female    DOB: 1919/01/24, 80 y.o.   MRN: [id number]  HPI 80 yo female with known hx of AB, AR  06/23/08- Thinks if she takes Centrum Silver vitamin, or eats her own homemade applesauce she gets a burning sensation, bilateral axillary/ ribs. No GI/GU or chest pain/palpitation otherwise. Denies sneeze, rhinorhea, wheeze or cough.  Continues allergy vaccine 1:10 every 2 weeks.   11/23/08-Asthmatic bronchitis, rhinits, CAD/CHF/AF  4-5 days hard left frontal and maxillary headache, watery rhinorhea. Ears ok, no fever or purulent discharge. Chest feels normal for her.  She is convinced allergy vaccine helps her.   June 22, 2009- Asthmatic bronchitis, rhinitis, CAD. CHF  Pain behind right ear since [place]. Has had ENT and carotid artery eval. Hearing aids.  Discussed flu shot.  Hauled dirt yesterday- still active. Morning cough with phlegm.   June 22, 2010- Asthmatic bronchitis, rhinitis, CAD/ MI. CHF  Nurse:cc Yearly follow up visit-asthma; Breathing is good per patient.  Says she has been doing well recently. She had an MI in February/ Dr Mare Ferrari.  Ribs get sore when shes tired. Then she tells me she picked up 20 bushels or walnuts- showing me her stained hands. Takes an antihistamine but doesn't know what it is- says prescription.  Had mold in home- remediated. No asthma or inhaler. Sometimes morning cough.  Nurse neighbor still gives allergy vaccine every 2 weeks. Discussed Epipen. Feels a little bump in right external ear canal- worried because friend had a cancer there.  05/09/2011 Acute walk in  Pt complains of rash on back for 1 week.  Was seen at Urgent care this am for rash - Pimple like rash across back and sides and on feet - Itching - Pt reports pulling a tick off under right arm this am. Unclear is this was a tick or bug because it crawled away. Urgent care dx w/ allergic dermatitis and rx Clarirtin.[name] Lesions are very pruritics. Has been  scratching and try to squeeze them. Believes she did remove 1 tick and but friend saw very small insect vs ticks crawling. She has greater than 25 bumps on back.  Works outside a lot.   06/21/11-80 year old female never smoker followed for Asthmatic bronchitis, rhinitis, complicated by CAD/ MI/ AF CHF, DM, GERD She continues allergy vaccine given by a neighbor/nurse without problems. She gets her shots every 2-3 weeks. He is using a dehumidifier because of mold in her house. So far the fall season is comfortable for her with occasional sneeze. She remains active and talks about picking up 20 bushels of walnuts from her property. Chest x-ray 12/15/2010 showed changes of COPD with no acute process. I talked with her about stopping allergy vaccine after all this time and at her age. She feels comfortable and safe with them and is considering my suggestion.  06/26/12- 80 year old female never smoker followed for Asthmatic bronchitis, rhinitis, complicated by CAD/ MI/ AF CHF, DM, GERD Stopped vaccine; states having some cough-clear in color-mainly happens in morning time. Can't tell me for how long this has been happening. Not concerned. Occ sneeze and blowing nose.  Asks refill red cough syrup "no codeine". Says over-the-counter cough syrups were no help. We discussed cough versus wheeze. She has been off allergy vaccine for the past year.   06/28/13- 80 year old female never smoker followed for Asthmatic bronchitis, rhinitis, complicated by CAD/ MI/ AF CHF, DM, GERD FOLLOWS FOR: increased in cough-clear in color. Recently (about a month  ago) had "artery surgery". now has increased mucus in mouth. Tolerated L CEA surgery. Cough makes throat sore, esp when she eats. CXR 05/19/13 IMPRESSION:  1. Cardiomegaly without pulmonary edema.  2. Bronchitic changes, likely chronic.  Original Report Authenticated By: Nolon Nations, M.D.  03/31/14- 80 year old female never smoker followed for Asthmatic  bronchitis, rhinitis, complicated by CAD/ MI/ AF CHF/ CABG/ CEA, DM, GERD ACUTE VISIT: Increased cough-clear in color since Sept 2014. Denies any SOB or wheezing. Had left CEA in September, then an MI in December and says she began coughing clear mucus after that-mostly a morning cough. Comfortable later in the day. Her PCP gave medication for "weakness"  July 16. , which she did not take. She thinks her son might be putting blood pressure pills in salad dressing  to make her feel weak.  CXR 7/16 /15 IMPRESSION:  Enlargement of cardiac silhouette post CABG.  COPD changes without acute abnormalities.  Electronically Signed  By: Lavonia Dana M.D.  On: 03/24/2014 16:06  Review of Systems- see HPI Constitutional:   No-   weight loss, night sweats, fevers, chills, fatigue, lassitude. HEENT:   No-  headaches, difficulty swallowing, tooth/dental problems, sore throat,       No-sneezing, no- itching, ear ache, nasal congestion, post nasal drip,  CV:  No-   chest pain, orthopnea, PND, swelling in lower extremities, anasarca, dizziness, palpitations Resp: No-   shortness of breath with exertion or at rest.              No-  productive cough,  + non-productive cough,  No-  coughing up of blood.              No-   change in color of mucus.  No- wheezing.   Skin: No-   rash or lesions. GI:  No-   heartburn, indigestion, abdominal pain, nausea, vomiting,  GU:  MS:  No-   joint pain or swelling.  . Neuro-   Psych:  No- change in mood or affect. No depression or anxiety.  No memory loss.  Objective:   Physical Exam General- Alert, Oriented, Affect-appropriate, Distress- none acute, slender, very alert, talkative Skin- rash-none, lesions- none, excoriation- none.  Lymphadenopathy- none Head- atraumatic            Eyes- Gross vision intact, PERRLA, conjunctivae clear secretions            Ears- Hearing, canals-normal for age            Nose- Clear, no-Septal dev, mucus, polyps, erosion, perforation              Throat- Mallampati II , mucosa clear , drainage- none, tonsils- atrophic.  Neck- flexible , trachea midline, no stridor , thyroid nl, carotid no bruit.  Chest - symmetrical excursion , unlabored           Heart/CV- RRR , no murmur , no gallop  , no rub, nl s1 s2                           - JVD- none , edema- none, stasis changes- none, varices- none           Lung- + few crackles right base, unlabored, wheeze- none, cough-none , dullness-none, rub- none           Chest wall-  Abd-  Br/ Gen/ Rectal- Not done, not indicated Extrem- cyanosis- none, clubbing, none, atrophy- none, strength- nl Neuro- grossly  intact to observation

## 2014-03-31 NOTE — Patient Instructions (Signed)
Ok for you to take Mucinex-DM if needed for cough

## 2014-04-03 ENCOUNTER — Encounter: Payer: Self-pay | Admitting: Internal Medicine

## 2014-04-03 NOTE — Assessment & Plan Note (Addendum)
Cough suggestive of chronic bronchitis/asthma without much wheeze and without recognized reflux event or exposure. With history of MI, need to watch for mild pulmonary vascular congestion. Plan-Mucinex DM

## 2014-04-09 DIAGNOSIS — R262 Difficulty in walking, not elsewhere classified: Secondary | ICD-10-CM | POA: Diagnosis not present

## 2014-04-13 DIAGNOSIS — R262 Difficulty in walking, not elsewhere classified: Secondary | ICD-10-CM | POA: Diagnosis not present

## 2014-04-15 DIAGNOSIS — R262 Difficulty in walking, not elsewhere classified: Secondary | ICD-10-CM | POA: Diagnosis not present

## 2014-04-20 ENCOUNTER — Other Ambulatory Visit (HOSPITAL_COMMUNITY): Payer: Self-pay | Admitting: Cardiology

## 2014-04-20 DIAGNOSIS — R262 Difficulty in walking, not elsewhere classified: Secondary | ICD-10-CM | POA: Diagnosis not present

## 2014-04-22 DIAGNOSIS — R262 Difficulty in walking, not elsewhere classified: Secondary | ICD-10-CM | POA: Diagnosis not present

## 2014-04-26 ENCOUNTER — Telehealth: Payer: Self-pay | Admitting: Cardiology

## 2014-04-26 NOTE — Telephone Encounter (Signed)
PT  NOTIFIED ./CY PER PT  CORRECTION  SUGAR  WAS  107 NOT  177./CY

## 2014-04-26 NOTE — Telephone Encounter (Signed)
FYI  WILL FORWARD TO  DR BRACKBILL FOR  REVIEW./CY

## 2014-04-26 NOTE — Telephone Encounter (Signed)
The patient should continue current medication.  Get extra rest today.  If her condition worsens she [place] need to see her PCP or go to an urgent care to evaluate for causes of fatigue.

## 2014-04-26 NOTE — Telephone Encounter (Signed)
New problem:    [facility] Vergia Alberts RN called to report that 8/17 around 5:30pm............Jill Howell     pt has been feel very weak hardly any energy at all.   BP 158/73  77 hr a littel elevated. Sugar was 77 then at lunch 177.

## 2014-04-27 DIAGNOSIS — R262 Difficulty in walking, not elsewhere classified: Secondary | ICD-10-CM | POA: Diagnosis not present

## 2014-04-29 DIAGNOSIS — R262 Difficulty in walking, not elsewhere classified: Secondary | ICD-10-CM | POA: Diagnosis not present

## 2014-05-04 DIAGNOSIS — R262 Difficulty in walking, not elsewhere classified: Secondary | ICD-10-CM | POA: Diagnosis not present

## 2014-05-09 ENCOUNTER — Other Ambulatory Visit: Payer: Self-pay | Admitting: Cardiology

## 2014-05-09 ENCOUNTER — Ambulatory Visit (INDEPENDENT_AMBULATORY_CARE_PROVIDER_SITE_OTHER): Payer: Medicare Other | Admitting: Family Medicine

## 2014-05-09 ENCOUNTER — Ambulatory Visit: Payer: Medicare Other | Admitting: Family Medicine

## 2014-05-09 ENCOUNTER — Encounter: Payer: Self-pay | Admitting: Family Medicine

## 2014-05-09 VITALS — BP 130/50 | HR 72 | Temp 97.5°F | Ht 64.0 in | Wt 122.8 lb

## 2014-05-09 DIAGNOSIS — Z23 Encounter for immunization: Secondary | ICD-10-CM | POA: Diagnosis not present

## 2014-05-09 DIAGNOSIS — S7001XA Contusion of right hip, initial encounter: Secondary | ICD-10-CM

## 2014-05-09 DIAGNOSIS — S61219A Laceration without foreign body of unspecified finger without damage to nail, initial encounter: Secondary | ICD-10-CM

## 2014-05-09 DIAGNOSIS — IMO0002 Reserved for concepts with insufficient information to code with codable children: Secondary | ICD-10-CM

## 2014-05-09 DIAGNOSIS — S7000XA Contusion of unspecified hip, initial encounter: Secondary | ICD-10-CM | POA: Diagnosis not present

## 2014-05-09 DIAGNOSIS — S61209A Unspecified open wound of unspecified finger without damage to nail, initial encounter: Secondary | ICD-10-CM | POA: Diagnosis not present

## 2014-05-09 DIAGNOSIS — S50311A Abrasion of right elbow, initial encounter: Secondary | ICD-10-CM

## 2014-05-09 DIAGNOSIS — I2510 Atherosclerotic heart disease of native coronary artery without angina pectoris: Secondary | ICD-10-CM | POA: Diagnosis not present

## 2014-05-09 MED ORDER — SULFAMETHOXAZOLE-TMP DS 800-160 MG PO TABS: 1.0000 | ORAL_TABLET | Freq: Two times a day (BID) | ORAL | Status: DC

## 2014-05-09 NOTE — Progress Notes (Signed)
Pre visit review using our clinic review tool, if applicable. No additional management support is needed unless otherwise documented below in the visit note. 

## 2014-05-09 NOTE — Progress Notes (Signed)
Dr. Frederico Hamman T. Samaiyah Howes, MD, [facility] [place], [address] Phone: [phone] Fax: [phone]  05/09/2014  Patient: Jill Howell, MRN: [id number], DOB: 1919/08/21, 80 y.o.  Primary Physician:  Elsie Stain, MD  Chief Complaint: Elbow Pain and Fall  Subjective:   Jill Howell is a 80 y.o. very pleasant female patient who presents with the following:  DOI 05/08/2014  The patient slipped and fell last evening and she hit her RIGHT elbow as well as her LEFT hand. She also hit her RIGHT lateral hip. Primary complaint of pain is around the elbow, but this is mostly superficial with abraded skin and she has one laterally deep laceration.  She also has a lacerationis fairly significant on her LEFT 4th finger RIGHT at the end of the digit.  R elbow, fell and hit elbow. Lac, small.  L 4th finger.   Td shot.   Contusion, R lateral hip. There is brusing only there.   Past Medical History, Surgical History, Social History, Family History, Problem List, Medications, and Allergies have been reviewed and updated if relevant.  GEN: No fevers, chills. Nontoxic. Primarily MSK c/o today. MSK: Detailed in the HPI GI: tolerating PO intake without difficulty Neuro: No numbness, parasthesias, or tingling associated. Otherwise the pertinent positives of the ROS are noted above.   Objective:   BP 130/50  Pulse 72  Temp(Src) 97.5 F (36.4 C) (Oral)  Ht 5\' 4"  (1.626 m)  Wt 122 lb 12 oz (55.679 kg)  BMI 21.06 kg/m2   GEN: WDWN, NAD, Non-toxic, Alert & Oriented x 3 HEENT: Atraumatic, Normocephalic.  Ears and Nose: No external deformity. EXTR: No clubbing/cyanosis/edema NEURO: Normal gait.  PSYCH: Normally interactive. Conversant. Not depressed or anxious appearing.  Calm demeanor.    The patient's RIGHT elbow has full range of motionin all directions. There is no significant swelling or bruising. There is an area of abrasion  laterallyis approximately 3-1/2 cm across. There is one deeper laceration in this area also. It is small and less than 1 cm.  There is also relatively large bruise on the patient's RIGHT hip, but both hips move with excellent range of motion for her 80 year old.   On the patient's LEFT 4th digit there is a laceration proximally 1.1 cm across. Good hemostasis. Full motion at the hand in all digits.  Radiology: No results found.  Assessment and Plan:   Abrasion of right elbow, initial encounter  Need for TD vaccine - Plan: Td vaccine preservative free greater than or equal to 7yo IM  Laceration of finger of left hand, initial encounter  Contusion of right hip, initial encounter   I have some concern about the abrasion and laceration on the patient's RIGHT elbow which is adjacent to the olecranon. It would be possible it penetrates to the olecranon bursa.I cleaned as well with alcohol. I recommended that the patient go on antibiotics, but she declined that recommendation.I gave her strict precautions if that turns red or warm at all that she needs to call us as soon as possible. I chose to leave this wound open to heal by secondary intention.  Reassured about the RIGHT hip contusion.  Procedure: Laceration of the LEFT 4th digit. The digit was prepped with alcohol withgood clean. Dermabond was used to close the wound with good success. No complications.  Follow-up: No Follow-up on file.  New Prescriptions   SULFAMETHOXAZOLE-TRIMETHOPRIM (BACTRIM DS) 800-160 MG PER TABLET  Take 1 tablet by mouth 2 (two) times daily.   Orders Placed This Encounter  Procedures  . Td vaccine preservative free greater than or equal to 7yo IM    Signed,  Seaborn Nakama T. Emmali Karow, MD   Patient's Medications  New Prescriptions   SULFAMETHOXAZOLE-TRIMETHOPRIM (BACTRIM DS) 800-160 MG PER TABLET    Take 1 tablet by mouth 2 (two) times daily.  Previous Medications   ASPIRIN 81 MG EC TABLET    Take 81 mg by  mouth daily.     ATENOLOL (TENORMIN) 25 MG TABLET    Take 12.5 mg by mouth daily.    BENZONATATE (TESSALON) 200 MG CAPSULE    Take 1 capsule (200 mg total) by mouth 3 (three) times daily as needed.   BIOTIN PO    Take by mouth.   DEXTROMETHORPHAN-GUAIFENESIN 20-400 MG TABS    Take 1 tablet by mouth 2 (two) times daily as needed.   DIGOX 125 MCG TABLET    TAKE ONE TABLET BY MOUTH EVERY DAY   FUROSEMIDE (LASIX) 20 MG TABLET    Take 1 tablet (20 mg total) by mouth daily.   ISOSORBIDE MONONITRATE (IMDUR) 30 MG 24 HR TABLET    TAKE 1 TABLET (30 MG TOTAL) BY MOUTH DAILY.   LEVOTHYROXINE (SYNTHROID, LEVOTHROID) 100 MCG TABLET    TAKE 1 TABLET (100 MCG TOTAL) BY MOUTH DAILY.   LOSARTAN (COZAAR) 25 MG TABLET    Take 1 tablet (25 mg total) by mouth daily.   MULTIPLE VITAMIN (MULTIVITAMINS PO)    Take by mouth.   MULTIPLE VITAMINS-MINERALS (PRESERVISION AREDS PO)    Take 1 tablet by mouth daily.   NITROSTAT 0.4 MG SL TABLET    PLACE 1 TABLET UNDER THE TONGUE EVERY 5 MINUTES AS NEEDED FOR CHEST PAIN.   PANTOPRAZOLE (PROTONIX) 40 MG TABLET    TAKE 1 TABLET BY MOUTH DAILY   SIMVASTATIN (ZOCOR) 40 MG TABLET    Take 40 mg by mouth every evening.  Modified Medications   No medications on file  Discontinued Medications   No medications on file

## 2014-05-10 DIAGNOSIS — R262 Difficulty in walking, not elsewhere classified: Secondary | ICD-10-CM | POA: Diagnosis not present

## 2014-05-31 ENCOUNTER — Encounter: Payer: Self-pay | Admitting: Cardiology

## 2014-05-31 ENCOUNTER — Ambulatory Visit (INDEPENDENT_AMBULATORY_CARE_PROVIDER_SITE_OTHER): Payer: Medicare Other | Admitting: Cardiology

## 2014-05-31 VITALS — BP 156/74 | HR 76 | Ht 63.0 in | Wt 121.0 lb

## 2014-05-31 DIAGNOSIS — Z639 Problem related to primary support group, unspecified: Secondary | ICD-10-CM

## 2014-05-31 DIAGNOSIS — I119 Hypertensive heart disease without heart failure: Secondary | ICD-10-CM | POA: Diagnosis not present

## 2014-05-31 DIAGNOSIS — I259 Chronic ischemic heart disease, unspecified: Secondary | ICD-10-CM

## 2014-05-31 DIAGNOSIS — I2510 Atherosclerotic heart disease of native coronary artery without angina pectoris: Secondary | ICD-10-CM

## 2014-05-31 DIAGNOSIS — I739 Peripheral vascular disease, unspecified: Secondary | ICD-10-CM

## 2014-05-31 DIAGNOSIS — I779 Disorder of arteries and arterioles, unspecified: Secondary | ICD-10-CM

## 2014-05-31 DIAGNOSIS — I4891 Unspecified atrial fibrillation: Secondary | ICD-10-CM | POA: Diagnosis not present

## 2014-05-31 DIAGNOSIS — E7800 Pure hypercholesterolemia, unspecified: Secondary | ICD-10-CM | POA: Diagnosis not present

## 2014-05-31 DIAGNOSIS — Z638 Other specified problems related to primary support group: Secondary | ICD-10-CM

## 2014-05-31 DIAGNOSIS — IMO0001 Reserved for inherently not codable concepts without codable children: Secondary | ICD-10-CM

## 2014-05-31 DIAGNOSIS — I480 Paroxysmal atrial fibrillation: Secondary | ICD-10-CM

## 2014-05-31 DIAGNOSIS — E1165 Type 2 diabetes mellitus with hyperglycemia: Secondary | ICD-10-CM

## 2014-05-31 LAB — CBC WITH DIFFERENTIAL/PLATELET
BASOS ABS: 0 10*3/uL (ref 0.0–0.1)
Basophils Relative: 0.4 % (ref 0.0–3.0)
EOS PCT: 1.7 % (ref 0.0–5.0)
Eosinophils Absolute: 0.1 10*3/uL (ref 0.0–0.7)
HEMATOCRIT: 37 % (ref 36.0–46.0)
HEMOGLOBIN: 12.6 g/dL (ref 12.0–15.0)
LYMPHS ABS: 2.5 10*3/uL (ref 0.7–4.0)
Lymphocytes Relative: 29.8 % (ref 12.0–46.0)
MCHC: 34 g/dL (ref 30.0–36.0)
MCV: 88.2 fl (ref 78.0–100.0)
Monocytes Absolute: 0.9 10*3/uL (ref 0.1–1.0)
Monocytes Relative: 11.1 % (ref 3.0–12.0)
Neutro Abs: 4.8 10*3/uL (ref 1.4–7.7)
Neutrophils Relative %: 57 % (ref 43.0–77.0)
Platelets: 198 10*3/uL (ref 150.0–400.0)
RBC: 4.2 Mil/uL (ref 3.87–5.11)
RDW: 14 % (ref 11.5–15.5)
WBC: 8.4 10*3/uL (ref 4.0–10.5)

## 2014-05-31 LAB — BASIC METABOLIC PANEL
BUN: 27 mg/dL — AB (ref 6–23)
CALCIUM: 9.2 mg/dL (ref 8.4–10.5)
CO2: 30 meq/L (ref 19–32)
Chloride: 95 mEq/L — ABNORMAL LOW (ref 96–112)
Creatinine, Ser: 0.9 mg/dL (ref 0.4–1.2)
GFR: 61.25 mL/min (ref >60.00)
GLUCOSE: 103 mg/dL — AB (ref 70–99)
Potassium: 4.4 mEq/L (ref 3.5–5.1)
Sodium: 130 mEq/L — ABNORMAL LOW (ref 135–145)

## 2014-05-31 NOTE — Patient Instructions (Signed)
Will obtain labs today and call you with the results (bmet/cbc)  Your physician recommends that you continue on your current medications as directed. Please refer to the Current Medication list given to you today.  Your physician recommends that you schedule a follow-up appointment in: 3 month ov
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
## 2014-05-31 NOTE — Assessment & Plan Note (Signed)
She has not had any recurrent TIA symptoms.
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
## 2014-05-31 NOTE — Assessment & Plan Note (Signed)
Her blood pressure was higher today but she has not taken her morning blood pressure medicines.  She is not having any symptoms recurrent angina or shortness of breath or CHF

## 2014-05-31 NOTE — Progress Notes (Signed)
Quick Note:  Please report to patient. The recent labs are stable. Continue same medication and careful diet. Anemia has improved. ______

## 2014-06-07 ENCOUNTER — Telehealth: Payer: Self-pay | Admitting: *Deleted

## 2014-06-07 NOTE — Telephone Encounter (Signed)
Received faxed form from [employer] for diabetes testing supplies. Called patient and was advised that she does use this company to get her testing supplies from. Patient stated that she checks her blood sugar 1-3 times a week depending on how she is feeling. Form is on your desk.

## 2014-06-08 ENCOUNTER — Encounter: Payer: Self-pay | Admitting: Family

## 2014-06-08 NOTE — Telephone Encounter (Signed)
Completed form faxed back to [facility].

## 2014-06-08 NOTE — Telephone Encounter (Signed)
Form done, thanks ?

## 2014-06-09 ENCOUNTER — Ambulatory Visit (HOSPITAL_COMMUNITY)
Admission: RE | Admit: 2014-06-09 | Discharge: 2014-06-09 | Disposition: A | Discharge status: Home / Self Care | Payer: Medicare Other | Source: Ambulatory Visit | Attending: Family | Admitting: Family

## 2014-06-09 ENCOUNTER — Encounter: Payer: Self-pay | Admitting: Family

## 2014-06-09 ENCOUNTER — Ambulatory Visit (INDEPENDENT_AMBULATORY_CARE_PROVIDER_SITE_OTHER): Payer: Medicare Other | Admitting: Family Medicine

## 2014-06-09 ENCOUNTER — Other Ambulatory Visit: Payer: Self-pay | Admitting: Cardiology

## 2014-06-09 ENCOUNTER — Ambulatory Visit (INDEPENDENT_AMBULATORY_CARE_PROVIDER_SITE_OTHER)
Admission: RE | Admit: 2014-06-09 | Discharge: 2014-06-09 | Disposition: A | Discharge status: Home / Self Care | Payer: Medicare Other | Source: Ambulatory Visit | Attending: Family Medicine | Admitting: Family Medicine

## 2014-06-09 ENCOUNTER — Encounter: Payer: Self-pay | Admitting: Family Medicine

## 2014-06-09 ENCOUNTER — Ambulatory Visit (INDEPENDENT_AMBULATORY_CARE_PROVIDER_SITE_OTHER): Payer: Medicare Other | Admitting: Family

## 2014-06-09 ENCOUNTER — Telehealth: Payer: Self-pay

## 2014-06-09 VITALS — BP 161/66 | HR 69 | Resp 16 | Ht 63.5 in | Wt 120.0 lb

## 2014-06-09 VITALS — BP 144/78 | HR 74 | Temp 97.3°F | Wt 119.8 lb

## 2014-06-09 DIAGNOSIS — I2510 Atherosclerotic heart disease of native coronary artery without angina pectoris: Secondary | ICD-10-CM

## 2014-06-09 DIAGNOSIS — Z639 Problem related to primary support group, unspecified: Secondary | ICD-10-CM

## 2014-06-09 DIAGNOSIS — I6523 Occlusion and stenosis of bilateral carotid arteries: Secondary | ICD-10-CM | POA: Insufficient documentation

## 2014-06-09 DIAGNOSIS — R0989 Other specified symptoms and signs involving the circulatory and respiratory systems: Secondary | ICD-10-CM

## 2014-06-09 DIAGNOSIS — I6529 Occlusion and stenosis of unspecified carotid artery: Secondary | ICD-10-CM | POA: Insufficient documentation

## 2014-06-09 DIAGNOSIS — Z48812 Encounter for surgical aftercare following surgery on the circulatory system: Secondary | ICD-10-CM

## 2014-06-09 DIAGNOSIS — I10 Essential (primary) hypertension: Secondary | ICD-10-CM | POA: Insufficient documentation

## 2014-06-09 DIAGNOSIS — Z638 Other specified problems related to primary support group: Secondary | ICD-10-CM

## 2014-06-09 DIAGNOSIS — I6522 Occlusion and stenosis of left carotid artery: Secondary | ICD-10-CM

## 2014-06-09 DIAGNOSIS — I517 Cardiomegaly: Secondary | ICD-10-CM | POA: Diagnosis not present

## 2014-06-09 DIAGNOSIS — Z4802 Encounter for removal of sutures: Secondary | ICD-10-CM | POA: Insufficient documentation

## 2014-06-09 NOTE — Patient Instructions (Signed)
Stroke Prevention Some medical conditions and behaviors are associated with an increased chance of having a stroke. You [place] prevent a stroke by making healthy choices and managing medical conditions. HOW CAN I REDUCE MY RISK OF HAVING A STROKE?   Stay physically active. Get at least 30 minutes of activity on most or all days.  Do not smoke. It [place] also be helpful to avoid exposure to secondhand smoke.  Limit alcohol use. Moderate alcohol use is considered to be:  No more than 2 drinks per day for men.  No more than 1 drink per day for nonpregnant women.  Eat healthy foods. This involves:  Eating 5 or more servings of fruits and vegetables a day.  Making dietary changes that address high blood pressure (hypertension), high cholesterol, diabetes, or obesity.  Manage your cholesterol levels.  Making food choices that are high in fiber and low in saturated fat, trans fat, and cholesterol [place] control cholesterol levels.  Take any prescribed medicines to control cholesterol as directed by your health care provider.  Manage your diabetes.  Controlling your carbohydrate and sugar intake is recommended to manage diabetes.  Take any prescribed medicines to control diabetes as directed by your health care provider.  Control your hypertension.  Making food choices that are low in salt (sodium), saturated fat, trans fat, and cholesterol is recommended to manage hypertension.  Take any prescribed medicines to control hypertension as directed by your health care provider.  Maintain a healthy weight.  Reducing calorie intake and making food choices that are low in sodium, saturated fat, trans fat, and cholesterol are recommended to manage weight.  Stop drug abuse.  Avoid taking birth control pills.  Talk to your health care provider about the risks of taking birth control pills if you are over 35 years old, smoke, get migraines, or have ever had a blood clot.  Get evaluated for sleep  disorders (sleep apnea).  Talk to your health care provider about getting a sleep evaluation if you snore a lot or have excessive sleepiness.  Take medicines only as directed by your health care provider.  For some people, aspirin or blood thinners (anticoagulants) are helpful in reducing the risk of forming abnormal blood clots that can lead to stroke. If you have the irregular heart rhythm of atrial fibrillation, you should be on a blood thinner unless there is a good reason you cannot take them.  Understand all your medicine instructions.  Make sure that other conditions (such as anemia or atherosclerosis) are addressed. SEEK IMMEDIATE MEDICAL CARE IF:   You have sudden weakness or numbness of the face, arm, or leg, especially on one side of the body.  Your face or eyelid droops to one side.  You have sudden confusion.  You have trouble speaking (aphasia) or understanding.  You have sudden trouble seeing in one or both eyes.  You have sudden trouble walking.  You have dizziness.  You have a loss of balance or coordination.  You have a sudden, severe headache with no known cause.  You have new chest pain or an irregular heartbeat. Any of these symptoms [place] represent a serious problem that is an emergency. Do not wait to see if the symptoms will go away. Get medical help at once. Call your local emergency services (911 in [place] Do not drive yourself to the hospital. Document Released: 10/03/2004 Document Revised: 01/10/2014 Document Reviewed: 02/26/2013 ExitCare Patient Information 2015 [employer]. This information is not intended to replace advice given   to you by your health care provider. Make sure you discuss any questions you have with your health care provider.  
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
## 2014-06-09 NOTE — Patient Instructions (Signed)
Go to the lab on the way out.  We'll contact you with your xray report. Take care.
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
## 2014-06-10 DIAGNOSIS — R0989 Other specified symptoms and signs involving the circulatory and respiratory systems: Secondary | ICD-10-CM | POA: Insufficient documentation

## 2014-06-10 NOTE — Assessment & Plan Note (Signed)
See above.  >25 minutes spent in face to face time with patient, >50% spent in counselling or coordination of care.
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
## 2014-06-10 NOTE — Assessment & Plan Note (Signed)
Scant, no change in CXR, NAD, this [place] be her baseline exam at this point. CXR reviewed, see notes on cxr.  No change in plan.

## 2014-06-10 NOTE — Telephone Encounter (Signed)
See OV note.  

## 2014-06-28 ENCOUNTER — Ambulatory Visit: Payer: Medicare Other | Admitting: Internal Medicine

## 2014-06-28 ENCOUNTER — Encounter: Payer: Self-pay | Admitting: Internal Medicine

## 2014-06-28 VITALS — BP 100/68 | HR 69 | Ht 63.0 in | Wt 121.8 lb

## 2014-06-28 DIAGNOSIS — Z23 Encounter for immunization: Secondary | ICD-10-CM

## 2014-06-28 MED ORDER — BENZONATATE 200 MG PO CAPS: 200.0000 mg | ORAL_CAPSULE | Freq: Three times a day (TID) | ORAL | Status: DC | PRN

## 2014-06-28 NOTE — Patient Instructions (Addendum)
Flu vax  Ok to use a simple otc cough syrup if needed  Script sent to drug store for benzonatate perles for cough

## 2014-06-28 NOTE — Progress Notes (Signed)
Subjective:    Patient ID: Jill Howell, female    DOB: 1919/01/24, 80 y.o.   MRN: [id number]  HPI 80 yo female with known hx of AB, AR  06/23/08- Thinks if she takes Centrum Silver vitamin, or eats her own homemade applesauce she gets a burning sensation, bilateral axillary/ ribs. No GI/GU or chest pain/palpitation otherwise. Denies sneeze, rhinorhea, wheeze or cough.  Continues allergy vaccine 1:10 every 2 weeks.   11/23/08-Asthmatic bronchitis, rhinits, CAD/CHF/AF  4-5 days hard left frontal and maxillary headache, watery rhinorhea. Ears ok, no fever or purulent discharge. Chest feels normal for her.  She is convinced allergy vaccine helps her.   June 22, 2009- Asthmatic bronchitis, rhinitis, CAD. CHF  Pain behind right ear since [place]. Has had ENT and carotid artery eval. Hearing aids.  Discussed flu shot.  Hauled dirt yesterday- still active. Morning cough with phlegm.   June 22, 2010- Asthmatic bronchitis, rhinitis, CAD/ MI. CHF  Nurse:cc Yearly follow up visit-asthma; Breathing is good per patient.  Says she has been doing well recently. She had an MI in February/ Dr Mare Ferrari.  Ribs get sore when shes tired. Then she tells me she picked up 20 bushels or walnuts- showing me her stained hands. Takes an antihistamine but doesn't know what it is- says prescription.  Had mold in home- remediated. No asthma or inhaler. Sometimes morning cough.  Nurse neighbor still gives allergy vaccine every 2 weeks. Discussed Epipen. Feels a little bump in right external ear canal- worried because friend had a cancer there.  05/09/2011 Acute walk in  Pt complains of rash on back for 1 week.  Was seen at Urgent care this am for rash - Pimple like rash across back and sides and on feet - Itching - Pt reports pulling a tick off under right arm this am. Unclear is this was a tick or bug because it crawled away. Urgent care dx w/ allergic dermatitis and rx Clarirtin.[name] Lesions are very pruritics. Has been  scratching and try to squeeze them. Believes she did remove 1 tick and but friend saw very small insect vs ticks crawling. She has greater than 25 bumps on back.  Works outside a lot.   06/21/11-80 year old female never smoker followed for Asthmatic bronchitis, rhinitis, complicated by CAD/ MI/ AF CHF, DM, GERD She continues allergy vaccine given by a neighbor/nurse without problems. She gets her shots every 2-3 weeks. He is using a dehumidifier because of mold in her house. So far the fall season is comfortable for her with occasional sneeze. She remains active and talks about picking up 20 bushels of walnuts from her property. Chest x-ray 12/15/2010 showed changes of COPD with no acute process. I talked with her about stopping allergy vaccine after all this time and at her age. She feels comfortable and safe with them and is considering my suggestion.  06/26/12- 80 year old female never smoker followed for Asthmatic bronchitis, rhinitis, complicated by CAD/ MI/ AF CHF, DM, GERD Stopped vaccine; states having some cough-clear in color-mainly happens in morning time. Can't tell me for how long this has been happening. Not concerned. Occ sneeze and blowing nose.  Asks refill red cough syrup "no codeine". Says over-the-counter cough syrups were no help. We discussed cough versus wheeze. She has been off allergy vaccine for the past year.   06/28/13- 80 year old female never smoker followed for Asthmatic bronchitis, rhinitis, complicated by CAD/ MI/ AF CHF, DM, GERD FOLLOWS FOR: increased in cough-clear in color. Recently (about a month  ago) had "artery surgery". now has increased mucus in mouth. Tolerated L CEA surgery. Cough makes throat sore, esp when she eats. CXR 05/19/13 IMPRESSION:  1. Cardiomegaly without pulmonary edema.  2. Bronchitic changes, likely chronic.  Original Report Authenticated By: Nolon Nations, M.D.  03/31/14- 80 year old female never smoker followed for Asthmatic  bronchitis, rhinitis, complicated by CAD/ MI/ AF CHF/ CABG/ CEA, DM, GERD ACUTE VISIT: Increased cough-clear in color since Sept 2014. Denies any SOB or wheezing. Had left CEA in September, then an MI in December and says she began coughing clear mucus after that-mostly a morning cough. Comfortable later in the day. Her PCP gave medication for "weakness"  July 16. , which she did not take. She thinks her son might be putting blood pressure pills in salad dressing  to make her feel weak.  CXR 7/16 /15 IMPRESSION:  Enlargement of cardiac silhouette post CABG.  COPD changes without acute abnormalities.  Electronically Signed  By: Lavonia Dana M.D.  On: 03/24/2014 16:06  06/28/14- 80 year old female never smoker followed for Asthmatic bronchitis, rhinitis, complicated by CAD/ MI/ AF CHF/ CABG/ CEA, DM, GERD FOLLOWS FOR: continues to have cough all the time-clear in color. Was told by pharmacy that she is on a medication that will make her cough-pt is unsure of the name of the medication  CXR 06/09/14 FINDINGS:  There is cardiomegaly. The patient is status post CABG. The lungs  are clear. The chest is hyperexpanded. No pneumothorax or pleural  effusion. Mitral anulus calcification noted. Postoperative change is  seen about both shoulders.  IMPRESSION:  No acute disease. Stable compared to prior exams.  Electronically Signed  By: Inge Rise M.D.  On: 06/09/2014 16:26   Review of Systems- see HPI Constitutional:   No-   weight loss, night sweats, fevers, chills, fatigue, lassitude. HEENT:   No-  headaches, difficulty swallowing, tooth/dental problems, sore throat,       No-sneezing, no- itching, ear ache, nasal congestion, post nasal drip,  CV:  No-   chest pain, orthopnea, PND, swelling in lower extremities, anasarca, dizziness, palpitations Resp: No-   shortness of breath with exertion or at rest.              No-  productive cough,  + non-productive cough,  No-  coughing up of blood.               No-   change in color of mucus.  No- wheezing.   Skin: No-   rash or lesions. GI:  No-   heartburn, indigestion, abdominal pain, nausea, vomiting,  GU:  MS:  No-   joint pain or swelling.  . Neuro-   Psych:  No- change in mood or affect. No depression or anxiety.  No memory loss.  Objective:   Physical Exam General- Alert, Oriented, Affect-appropriate, Distress- none acute, slender, very alert, talkative Skin- rash-none, lesions- none, excoriation- none.  Lymphadenopathy- none Head- atraumatic            Eyes- Gross vision intact, PERRLA, conjunctivae clear secretions            Ears- Hearing, canals-normal for age            Nose- Clear, no-Septal dev, mucus, polyps, erosion, perforation             Throat- Mallampati II , mucosa clear , drainage- none, tonsils- atrophic.  Neck- flexible , trachea midline, no stridor , thyroid nl, carotid no bruit.  Chest - symmetrical excursion ,  unlabored           Heart/CV- RRR , no murmur , no gallop  , no rub, nl s1 s2                           - JVD- none , edema- none, stasis changes- none, varices- none           Lung- + few crackles right base, unlabored, wheeze- none, cough-none , dullness-none, rub- none           Chest wall-  Abd-  Br/ Gen/ Rectal- Not done, not indicated Extrem- cyanosis- none, clubbing, none, atrophy- none, strength- nl Neuro- grossly intact to observation

## 2014-07-04 ENCOUNTER — Telehealth: Payer: Self-pay | Admitting: Cardiology

## 2014-07-04 NOTE — Telephone Encounter (Signed)
New message          Pt allergy doctor told her she has an enlarged heart / pt wants to know if [place] knows this

## 2014-07-04 NOTE — Telephone Encounter (Signed)
Spoke with patient and she had chest pains on 06/09/14 none since then Per patient she was seen recently by MD and he wanted to put her on allergy/cough medication. She read information given to patient and refused to take Patient stated she just didn't feel right and had not for weeks. Tried to inquire how she didn't feel right she just kept saying something was not right with her.  Discussed with  Dr. Mare Ferrari and recommended calling PCP Advised patient

## 2014-07-18 ENCOUNTER — Encounter: Payer: Self-pay | Admitting: Internal Medicine

## 2014-07-26 ENCOUNTER — Other Ambulatory Visit: Payer: Self-pay | Admitting: Cardiology

## 2014-08-05 ENCOUNTER — Other Ambulatory Visit: Payer: Self-pay | Admitting: Cardiology

## 2014-08-06 ENCOUNTER — Other Ambulatory Visit: Payer: Self-pay | Admitting: Cardiology

## 2014-08-09 ENCOUNTER — Other Ambulatory Visit: Payer: Self-pay | Admitting: Cardiology

## 2014-08-11 ENCOUNTER — Encounter: Payer: Self-pay | Admitting: *Deleted

## 2014-08-11 ENCOUNTER — Telehealth: Payer: Self-pay | Admitting: Cardiology

## 2014-08-11 NOTE — Telephone Encounter (Signed)
New problem   Pt need to speak to nurse concerning a letter she need Dr Mare Ferrari to write for her, concerning she is unable to take care of some matter. Please call pt.

## 2014-08-12 NOTE — Telephone Encounter (Signed)
Follow Up  Pt called to follow up on letter. Indicating she is not able to go on with business as usuaul. Pt doesn't want to explain it over the phone but wants to explain it to the Dr. She says she is not able to go through court. Someone took her money but they are expecting her to go to court. But she is unable to do so.. Please help

## 2014-08-12 NOTE — Telephone Encounter (Signed)
Advised patient letter done and will mail to her

## 2014-08-16 ENCOUNTER — Telehealth: Payer: Self-pay

## 2014-08-16 ENCOUNTER — Ambulatory Visit (INDEPENDENT_AMBULATORY_CARE_PROVIDER_SITE_OTHER): Payer: Medicare Other | Admitting: Family Medicine

## 2014-08-16 ENCOUNTER — Encounter: Payer: Self-pay | Admitting: Family Medicine

## 2014-08-16 VITALS — BP 158/70 | HR 74 | Temp 97.6°F | Wt 124.5 lb

## 2014-08-16 DIAGNOSIS — W19XXXA Unspecified fall, initial encounter: Secondary | ICD-10-CM

## 2014-08-16 DIAGNOSIS — E119 Type 2 diabetes mellitus without complications: Secondary | ICD-10-CM

## 2014-08-16 DIAGNOSIS — Y92099 Unspecified place in other non-institutional residence as the place of occurrence of the external cause: Secondary | ICD-10-CM | POA: Diagnosis not present

## 2014-08-16 DIAGNOSIS — I2510 Atherosclerotic heart disease of native coronary artery without angina pectoris: Secondary | ICD-10-CM | POA: Diagnosis not present

## 2014-08-16 DIAGNOSIS — Y92009 Unspecified place in unspecified non-institutional (private) residence as the place of occurrence of the external cause: Secondary | ICD-10-CM

## 2014-08-16 DIAGNOSIS — R296 Repeated falls: Secondary | ICD-10-CM | POA: Diagnosis not present

## 2014-08-16 MED ORDER — ATENOLOL 25 MG PO TABS: 12.5000 mg | ORAL_TABLET | Freq: Every day | ORAL | Status: DC

## 2014-08-16 NOTE — Telephone Encounter (Signed)
PLEASE NOTE: All timestamps contained within this report are represented as [place] Standard Time. CONFIDENTIALTY NOTICE: This fax transmission is intended only for the addressee. It contains information that is legally privileged, confidential or otherwise protected from use or disclosure. If you are not the intended recipient, you are strictly prohibited from reviewing, disclosing, copying using or disseminating any of this information or taking any action in reliance on or regarding this information. If you have received this fax in error, please notify us immediately by telephone so that we can arrange for its return to [place]. Phone: [phone], Toll-Free: [phone], Fax: [phone] Page: 1 of 2 Call Id: [id number] [place] Patient Name: Jill Howell Gender: Female DOB: Sep 22, 1918 Age: 80 Y 28 M 8 D Return Phone Number: [phone] (Primary) Address: City/State/Zip: [place] Client [place] [facility] - Client Client Site [facility] - Day Physician Renford Dills Contact Type Call Call Type Triage / Clinical Relationship To Patient Self Return Phone Number [phone] (Primary) Chief Complaint Head Injury (non urgent symptom) Initial Comment caller states she fell and hit the back of her neck/ head, she is having spells where she gets weak in the legs PreDisposition Call Doctor Nurse Assessment Nurse: Vallery Sa, RN, [place] Date/Time (Eastern Time): 08/16/2014 2:01:03 PM Confirm and document reason for call. If symptomatic, describe symptoms. ---Caller states she fell and hit her head and neck on an unknown surface about an 1.5 hours ago. No cuts to the skin. Has the patient traveled out of the country within the last 30 days? ---No Does the patient require triage? ---Yes Related visit to physician within the last 2 weeks? ---No Does the PT have any  chronic conditions? (i.e. diabetes, asthma, etc.) ---Yes List chronic conditions. ---"Heart problems" Guidelines Guideline Title Affirmed Question Affirmed Notes Nurse Date/Time (Eastern Time) Head Injury Patient is confused or is an unreliable provider of information (e.g., dementia, profound mental retardation, alcohol intoxication) 80 years old Vallery Sa, [place], [place] 08/16/2014 2:03:42 PM Disp. Time Eilene Ghazi Time) Disposition Final User 08/16/2014 1:55:47 PM Send To Clinical Follow Up Jeral Fruit 08/16/2014 2:08:40 PM See Physician within 4 Hours (or PCP triage) Yes [place], RN, Rosey Bath Understands: Yes PLEASE NOTE: All timestamps contained within this report are represented as [place] Standard Time. CONFIDENTIALTY NOTICE: This fax transmission is intended only for the addressee. It contains information that is legally privileged, confidential or otherwise protected from use or disclosure. If you are not the intended recipient, you are strictly prohibited from reviewing, disclosing, copying using or disseminating any of this information or taking any action in reliance on or regarding this information. If you have received this fax in error, please notify us immediately by telephone so that we can arrange for its return to [place]. Phone: [phone], Toll-Free: [phone], Fax: [phone] Page: 2 of 2 Call Id: [id number] Disagree/Comply: Comply Care Advice Given Per Guideline SEE PHYSICIAN WITHIN 4 HOURS (or PCP triage): * IF NO PCP TRIAGE: You need to be seen. Go to _______________ (ED/ [facility] or office if it will be open) within the next 3 or 4 hours. Go sooner if you become worse. BLEEDING: If there is a scrape or cut, wash it off with soap and water. Then apply pressure with a sterile gauze for 10 minutes to stop any bleeding. CALL BACK IF: * You become worse. CARE ADVICE given per Head Injury (Adult) guideline. After Care Instructions Given Call  Event Type User Date /  Time Description Comments User: Berton Mount, RN Date/Time Eilene Ghazi Time): 08/16/2014 2:11:39 PM Called the office backline and Morey Hummingbird will assist with a quick appointment and/or further direction from Dr. Damita Dunnings. Referrals REFERRED TO PCP OFFICE

## 2014-08-16 NOTE — Patient Instructions (Signed)
Go to the lab on the way out.  We'll contact you with your lab report. Rosaria Ferries will call about your referral. Take a snack frequently to prevent low sugars.  Glad to see you.  Take care.

## 2014-08-16 NOTE — Telephone Encounter (Signed)
Pt has appt today at 3:45 pm with Dr Damita Dunnings.

## 2014-08-16 NOTE — Progress Notes (Signed)
Pre visit review using our clinic review tool, if applicable. No additional management support is needed unless otherwise documented below in the visit note. Patient is not sure about her medications.  B legs have felt weak episodically.  She isn't on DM meds but she has been having sugars as low as 70.  When she has a low sugar, she feels weaker.   No CP recently.  No SOB.  No syncope.    She had been eating lunch (so her sugar wasn't likely to be low) and got up, then later on tripped on a child's toy.  She wasn't lightheaded.  No LOC.   She hit the back of her head.  No other trauma.  No pain now.  This isn't her first fall.  D/w pt about fall precautions.   Meds, vitals, and allergies reviewed.   ROS: See HPI.  Otherwise, noncontributory.  nad ncat except for small abrasion on L occiput, ~1cm, no deep skin disruption, scalp isn't ttp She can move her neck normally.  Neck not ttp, no midline pain Op wnl PERRL rrr ctab abd soft No other abrasions CN 2-12 wnl B, S/S/DTR wnl x4 but she is slightly unsteady walking.

## 2014-08-17 ENCOUNTER — Telehealth: Payer: Self-pay | Admitting: Cardiology

## 2014-08-17 LAB — COMPREHENSIVE METABOLIC PANEL
ALT: 23 U/L (ref 0–35)
AST: 26 U/L (ref 0–37)
Albumin: 4.3 g/dL (ref 3.5–5.2)
Alkaline Phosphatase: 68 U/L (ref 39–117)
BUN: 27 mg/dL — ABNORMAL HIGH (ref 6–23)
CALCIUM: 9.1 mg/dL (ref 8.4–10.5)
CHLORIDE: 100 meq/L (ref 96–112)
CO2: 28 mEq/L (ref 19–32)
CREATININE: 1 mg/dL (ref 0.4–1.2)
GFR: 53.67 mL/min — ABNORMAL LOW (ref >60.00)
Glucose, Bld: 100 mg/dL — ABNORMAL HIGH (ref 70–99)
Potassium: 4.7 mEq/L (ref 3.5–5.1)
Sodium: 134 mEq/L — ABNORMAL LOW (ref 135–145)
Total Bilirubin: 0.7 mg/dL (ref 0.2–1.2)
Total Protein: 6.8 g/dL (ref 6.0–8.3)

## 2014-08-17 LAB — CBC WITH DIFFERENTIAL/PLATELET
BASOS PCT: 0.6 % (ref 0.0–3.0)
Basophils Absolute: 0.1 10*3/uL (ref 0.0–0.1)
EOS ABS: 0.2 10*3/uL (ref 0.0–0.7)
EOS PCT: 2.4 % (ref 0.0–5.0)
HCT: 34.9 % — ABNORMAL LOW (ref 36.0–46.0)
HEMOGLOBIN: 11.7 g/dL — AB (ref 12.0–15.0)
LYMPHS PCT: 28.2 % (ref 12.0–46.0)
Lymphs Abs: 2.8 10*3/uL (ref 0.7–4.0)
MCHC: 33.6 g/dL (ref 30.0–36.0)
MCV: 87 fl (ref 78.0–100.0)
Monocytes Absolute: 1.2 10*3/uL — ABNORMAL HIGH (ref 0.1–1.0)
Monocytes Relative: 11.5 % (ref 3.0–12.0)
NEUTROS ABS: 5.8 10*3/uL (ref 1.4–7.7)
Neutrophils Relative %: 57.3 % (ref 43.0–77.0)
Platelets: 192 10*3/uL (ref 150.0–400.0)
RBC: 4.01 Mil/uL (ref 3.87–5.11)
RDW: 14.2 % (ref 11.5–15.5)
WBC: 10.1 10*3/uL (ref 4.0–10.5)

## 2014-08-17 LAB — HEMOGLOBIN A1C: Hgb A1c MFr Bld: 5.8 % (ref 4.6–6.5)

## 2014-08-17 NOTE — Telephone Encounter (Signed)
F/u    Pt stated she haven't received letter. Please call pt.

## 2014-08-17 NOTE — Assessment & Plan Note (Signed)
No meds currently, see notes on labs.  Likely not related to the event today. >25 minutes spent in face to face time with patient, >50% spent in counselling or coordination of care.

## 2014-08-17 NOTE — Assessment & Plan Note (Signed)
Refer to PT.  No sign of cardio pulmonary source for today's fall.  She wasn't lightheaded.  No syncope.  No CP.  She was tangled up in a child's toy.  Refer to PT.  She agrees.

## 2014-08-17 NOTE — Telephone Encounter (Signed)
Spoke with patient regarding letter, will mail to patient again

## 2014-08-18 ENCOUNTER — Encounter (HOSPITAL_COMMUNITY): Payer: Self-pay | Admitting: Cardiology

## 2014-08-19 ENCOUNTER — Telehealth: Payer: Self-pay | Admitting: Cardiology

## 2014-08-19 ENCOUNTER — Encounter: Payer: Self-pay | Admitting: Family Medicine

## 2014-08-19 NOTE — Telephone Encounter (Signed)
New Message         Pt wanting to let Rip Harbour know that she received her letter in the mail.

## 2014-08-19 NOTE — Telephone Encounter (Signed)
Noted  

## 2014-08-30 ENCOUNTER — Encounter: Payer: Self-pay | Admitting: Cardiology

## 2014-08-30 ENCOUNTER — Ambulatory Visit (INDEPENDENT_AMBULATORY_CARE_PROVIDER_SITE_OTHER): Payer: Medicare Other | Admitting: Cardiology

## 2014-08-30 VITALS — BP 144/56 | HR 80 | Ht 63.0 in | Wt 122.0 lb

## 2014-08-30 DIAGNOSIS — I5032 Chronic diastolic (congestive) heart failure: Secondary | ICD-10-CM | POA: Diagnosis not present

## 2014-08-30 DIAGNOSIS — I480 Paroxysmal atrial fibrillation: Secondary | ICD-10-CM

## 2014-08-30 DIAGNOSIS — I2510 Atherosclerotic heart disease of native coronary artery without angina pectoris: Secondary | ICD-10-CM

## 2014-08-30 NOTE — Patient Instructions (Signed)
STOP DIGOXIN.  CONTINUE ALL OTHER MEDICATIONS.  Your physician wants you to follow-up in:  3 MONTHS with a lipid profile, liver functions, cbc and a bmet.  You will receive a reminder letter in the mail two months in advance. If you don't receive a letter, please call our office to schedule the follow-up appointment.

## 2014-08-30 NOTE — Assessment & Plan Note (Signed)
The patient has not had any recent chest discomfort to suggest angina pectoris.

## 2014-08-30 NOTE — Assessment & Plan Note (Signed)
The patient has had no recurrent atrial fibrillation.
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
## 2014-08-30 NOTE — Assessment & Plan Note (Signed)
The patient has not been having any changes to suggest increased CHF

## 2014-09-05 ENCOUNTER — Telehealth: Payer: Self-pay | Admitting: Cardiology

## 2014-09-05 MED ORDER — DIGOXIN 125 MCG PO TABS: 125.0000 ug | ORAL_TABLET | Freq: Every day | ORAL | Status: DC

## 2014-09-05 NOTE — Telephone Encounter (Signed)
Advised patient

## 2014-09-05 NOTE — Telephone Encounter (Signed)
New problem   Pt need to speak to nurse about wanting to go back on digoxin. Please call pt.

## 2014-09-05 NOTE — Telephone Encounter (Signed)
Okay to continue digoxin.

## 2014-09-05 NOTE — Telephone Encounter (Signed)
Did not stop the digoxin as recommended at ov Patient thinks she is getting stronger Wants to know if ok to continue

## 2014-09-12 ENCOUNTER — Telehealth: Payer: Self-pay | Admitting: Family Medicine

## 2014-09-12 NOTE — Telephone Encounter (Signed)
Patient was suppose to go to physical therapy.  Patient said she cancelled the appointment.  Patient said her Cardiologist told her not to go out in the cold. Patient didn't reschedule the appointment because it will be cold in January and February and she'll check with the Cardiologist in March about rescheduling the appointment.  Patient said she's been feeling stronger for the past 2 weeks.

## 2014-09-12 NOTE — Telephone Encounter (Signed)
Noted. Thanks.

## 2014-10-28 ENCOUNTER — Other Ambulatory Visit: Payer: Self-pay | Admitting: Cardiology

## 2014-11-01 ENCOUNTER — Other Ambulatory Visit: Payer: Self-pay | Admitting: Cardiology

## 2014-11-07 ENCOUNTER — Other Ambulatory Visit: Payer: Self-pay | Admitting: Cardiology

## 2014-11-29 ENCOUNTER — Telehealth: Payer: Self-pay | Admitting: *Deleted

## 2014-11-29 DIAGNOSIS — I480 Paroxysmal atrial fibrillation: Secondary | ICD-10-CM

## 2014-11-29 DIAGNOSIS — I5032 Chronic diastolic (congestive) heart failure: Secondary | ICD-10-CM

## 2014-11-29 DIAGNOSIS — I200 Unstable angina: Secondary | ICD-10-CM

## 2014-11-29 DIAGNOSIS — I2510 Atherosclerotic heart disease of native coronary artery without angina pectoris: Secondary | ICD-10-CM

## 2014-11-29 DIAGNOSIS — I6522 Occlusion and stenosis of left carotid artery: Secondary | ICD-10-CM

## 2014-11-29 DIAGNOSIS — I739 Peripheral vascular disease, unspecified: Secondary | ICD-10-CM

## 2014-11-29 DIAGNOSIS — I779 Disorder of arteries and arterioles, unspecified: Secondary | ICD-10-CM

## 2014-11-29 DIAGNOSIS — I214 Non-ST elevation (NSTEMI) myocardial infarction: Secondary | ICD-10-CM

## 2014-11-29 DIAGNOSIS — I119 Hypertensive heart disease without heart failure: Secondary | ICD-10-CM

## 2014-11-29 NOTE — Telephone Encounter (Signed)
Those were the correct labs to draw.

## 2014-11-29 NOTE — Telephone Encounter (Signed)
Patient called to ask if she could come in prior to appointment tomorrow, 11/30/14, for lab work since her appointment is at [facility] and she feels she will need to eat something or drink some ensure before her mid-morning appointment. She feels it is too long to be fasting and she prefers to get the lab work done first. Dr. Mare Ferrari has the labs to be ordered in his OV notes from December, 2015. Patient informed she can come in early for labs. Will route to Dr. Mare Ferrari and his nurse, in case additional labs are desired to be added for 11/30/14.

## 2014-11-30 ENCOUNTER — Encounter: Payer: Self-pay | Admitting: Cardiology

## 2014-11-30 ENCOUNTER — Ambulatory Visit (INDEPENDENT_AMBULATORY_CARE_PROVIDER_SITE_OTHER): Payer: Medicare Other | Admitting: Cardiology

## 2014-11-30 VITALS — BP 146/84 | HR 73 | Ht 63.0 in | Wt 126.8 lb

## 2014-11-30 DIAGNOSIS — I2510 Atherosclerotic heart disease of native coronary artery without angina pectoris: Secondary | ICD-10-CM | POA: Diagnosis not present

## 2014-11-30 DIAGNOSIS — I779 Disorder of arteries and arterioles, unspecified: Secondary | ICD-10-CM | POA: Diagnosis not present

## 2014-11-30 DIAGNOSIS — R5381 Other malaise: Secondary | ICD-10-CM | POA: Diagnosis not present

## 2014-11-30 DIAGNOSIS — I214 Non-ST elevation (NSTEMI) myocardial infarction: Secondary | ICD-10-CM

## 2014-11-30 DIAGNOSIS — I200 Unstable angina: Secondary | ICD-10-CM

## 2014-11-30 DIAGNOSIS — I5032 Chronic diastolic (congestive) heart failure: Secondary | ICD-10-CM | POA: Diagnosis not present

## 2014-11-30 DIAGNOSIS — R5383 Other fatigue: Secondary | ICD-10-CM | POA: Diagnosis not present

## 2014-11-30 DIAGNOSIS — I119 Hypertensive heart disease without heart failure: Secondary | ICD-10-CM | POA: Diagnosis not present

## 2014-11-30 DIAGNOSIS — I480 Paroxysmal atrial fibrillation: Secondary | ICD-10-CM | POA: Diagnosis not present

## 2014-11-30 DIAGNOSIS — I6522 Occlusion and stenosis of left carotid artery: Secondary | ICD-10-CM

## 2014-11-30 DIAGNOSIS — I739 Peripheral vascular disease, unspecified: Principal | ICD-10-CM

## 2014-11-30 DIAGNOSIS — I259 Chronic ischemic heart disease, unspecified: Secondary | ICD-10-CM

## 2014-11-30 LAB — CBC WITH DIFFERENTIAL/PLATELET
BASOS PCT: 0.4 % (ref 0.0–3.0)
Basophils Absolute: 0 10*3/uL (ref 0.0–0.1)
EOS PCT: 1.3 % (ref 0.0–5.0)
Eosinophils Absolute: 0.1 10*3/uL (ref 0.0–0.7)
HEMATOCRIT: 35.3 % — AB (ref 36.0–46.0)
HEMOGLOBIN: 12.2 g/dL (ref 12.0–15.0)
LYMPHS ABS: 2.4 10*3/uL (ref 0.7–4.0)
Lymphocytes Relative: 27.6 % (ref 12.0–46.0)
MCHC: 34.6 g/dL (ref 30.0–36.0)
MCV: 86.9 fl (ref 78.0–100.0)
MONOS PCT: 11.2 % (ref 3.0–12.0)
Monocytes Absolute: 1 10*3/uL (ref 0.1–1.0)
NEUTROS ABS: 5.1 10*3/uL (ref 1.4–7.7)
Neutrophils Relative %: 59.5 % (ref 43.0–77.0)
Platelets: 191 10*3/uL (ref 150.0–400.0)
RBC: 4.07 Mil/uL (ref 3.87–5.11)
RDW: 13.6 % (ref 11.5–15.5)
WBC: 8.6 10*3/uL (ref 4.0–10.5)

## 2014-11-30 LAB — LIPID PANEL
CHOLESTEROL: 159 mg/dL (ref 0–200)
HDL: 31.1 mg/dL — ABNORMAL LOW (ref >39.00)
Total CHOL/HDL Ratio: 5
Triglycerides: 446 mg/dL — ABNORMAL HIGH (ref 0.0–149.0)

## 2014-11-30 LAB — HEPATIC FUNCTION PANEL
ALBUMIN: 4.3 g/dL (ref 3.5–5.2)
ALT: 21 U/L (ref 0–35)
AST: 24 U/L (ref 0–37)
Alkaline Phosphatase: 60 U/L (ref 39–117)
Bilirubin, Direct: 0.1 mg/dL (ref 0.0–0.3)
TOTAL PROTEIN: 7.2 g/dL (ref 6.0–8.3)
Total Bilirubin: 0.9 mg/dL (ref 0.2–1.2)

## 2014-11-30 LAB — BASIC METABOLIC PANEL
BUN: 29 mg/dL — ABNORMAL HIGH (ref 6–23)
CHLORIDE: 98 meq/L (ref 96–112)
CO2: 28 mEq/L (ref 19–32)
Calcium: 9.4 mg/dL (ref 8.4–10.5)
Creatinine, Ser: 1 mg/dL (ref 0.40–1.20)
GFR: 54.88 mL/min — AB (ref >60.00)
Glucose, Bld: 105 mg/dL — ABNORMAL HIGH (ref 70–99)
POTASSIUM: 4.6 meq/L (ref 3.5–5.1)
Sodium: 134 mEq/L — ABNORMAL LOW (ref 135–145)

## 2014-11-30 LAB — TSH: TSH: 0.46 u[IU]/mL (ref 0.35–4.50)

## 2014-11-30 LAB — LDL CHOLESTEROL, DIRECT: Direct LDL: 60 mg/dL

## 2014-11-30 NOTE — Patient Instructions (Signed)
Will obtain labs today and call you with the results (LP/BMET/HFP/TSH/CBC)  Your physician recommends that you continue on your current medications as directed. Please refer to the Current Medication list given to you today.  Your physician recommends that you schedule a follow-up appointment in: 3 months with fasting labs (LP/BMET/HFP/TSH)
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
## 2014-11-30 NOTE — Progress Notes (Signed)
Quick Note:  Please report to patient. The recent labs are stable. Continue same medication and careful diet. Thyroid level is stable. ______

## 2014-12-28 ENCOUNTER — Encounter: Payer: Self-pay | Admitting: Internal Medicine

## 2014-12-28 ENCOUNTER — Ambulatory Visit: Payer: Medicare Other | Admitting: Internal Medicine

## 2014-12-28 VITALS — BP 122/80 | HR 71 | Ht 63.0 in | Wt 127.4 lb

## 2014-12-28 DIAGNOSIS — J4489 Other specified chronic obstructive pulmonary disease: Secondary | ICD-10-CM

## 2014-12-28 NOTE — Patient Instructions (Signed)
Please call if you need [place]

## 2014-12-28 NOTE — Progress Notes (Signed)
Subjective:    Patient ID: Jill Howell, female    DOB: 1919/01/24, 81 y.o.   MRN: [id number]  HPI 80 yo female with known hx of AB, AR  06/23/08- Thinks if she takes Centrum Silver vitamin, or eats her own homemade applesauce she gets a burning sensation, bilateral axillary/ ribs. No GI/GU or chest pain/palpitation otherwise. Denies sneeze, rhinorhea, wheeze or cough.  Continues allergy vaccine 1:10 every 2 weeks.   11/23/08-Asthmatic bronchitis, rhinits, CAD/CHF/AF  4-5 days hard left frontal and maxillary headache, watery rhinorhea. Ears ok, no fever or purulent discharge. Chest feels normal for her.  She is convinced allergy vaccine helps her.   June 22, 2009- Asthmatic bronchitis, rhinitis, CAD. CHF  Pain behind right ear since [place]. Has had ENT and carotid artery eval. Hearing aids.  Discussed flu shot.  Hauled dirt yesterday- still active. Morning cough with phlegm.   June 22, 2010- Asthmatic bronchitis, rhinitis, CAD/ MI. CHF  Nurse:cc Yearly follow up visit-asthma; Breathing is good per patient.  Says she has been doing well recently. She had an MI in February/ Dr Mare Ferrari.  Ribs get sore when shes tired. Then she tells me she picked up 20 bushels or walnuts- showing me her stained hands. Takes an antihistamine but doesn't know what it is- says prescription.  Had mold in home- remediated. No asthma or inhaler. Sometimes morning cough.  Nurse neighbor still gives allergy vaccine every 2 weeks. Discussed Epipen. Feels a little bump in right external ear canal- worried because friend had a cancer there.  05/09/2011 Acute walk in  Pt complains of rash on back for 1 week.  Was seen at Urgent care this am for rash - Pimple like rash across back and sides and on feet - Itching - Pt reports pulling a tick off under right arm this am. Unclear is this was a tick or bug because it crawled away. Urgent care dx w/ allergic dermatitis and rx Clarirtin.[name] Lesions are very pruritics. Has been  scratching and try to squeeze them. Believes she did remove 1 tick and but friend saw very small insect vs ticks crawling. She has greater than 25 bumps on back.  Works outside a lot.   06/21/11-81 year old female never smoker followed for Asthmatic bronchitis, rhinitis, complicated by CAD/ MI/ AF CHF, DM, GERD She continues allergy vaccine given by a neighbor/nurse without problems. She gets her shots every 2-3 weeks. He is using a dehumidifier because of mold in her house. So far the fall season is comfortable for her with occasional sneeze. She remains active and talks about picking up 20 bushels of walnuts from her property. Chest x-ray 12/15/2010 showed changes of COPD with no acute process. I talked with her about stopping allergy vaccine after all this time and at her age. She feels comfortable and safe with them and is considering my suggestion.  06/26/12- 81 year old female never smoker followed for Asthmatic bronchitis, rhinitis, complicated by CAD/ MI/ AF CHF, DM, GERD Stopped vaccine; states having some cough-clear in color-mainly happens in morning time. Can't tell me for how long this has been happening. Not concerned. Occ sneeze and blowing nose.  Asks refill red cough syrup "no codeine". Says over-the-counter cough syrups were no help. We discussed cough versus wheeze. She has been off allergy vaccine for the past year.   06/28/13- 81 year old female never smoker followed for Asthmatic bronchitis, rhinitis, complicated by CAD/ MI/ AF CHF, DM, GERD FOLLOWS FOR: increased in cough-clear in color. Recently (about a month  ago) had "artery surgery". now has increased mucus in mouth. Tolerated L CEA surgery. Cough makes throat sore, esp when she eats. CXR 05/19/13 IMPRESSION:  1. Cardiomegaly without pulmonary edema.  2. Bronchitic changes, likely chronic.  Original Report Authenticated By: Nolon Nations, M.D.  03/31/14- 81 year old female never smoker followed for Asthmatic  bronchitis, rhinitis, complicated by CAD/ MI/ AF CHF/ CABG/ CEA, DM, GERD ACUTE VISIT: Increased cough-clear in color since Sept 2014. Denies any SOB or wheezing. Had left CEA in September, then an MI in December and says she began coughing clear mucus after that-mostly a morning cough. Comfortable later in the day. Her PCP gave medication for "weakness"  July 16. , which she did not take. She thinks her son might be putting blood pressure pills in salad dressing  to make her feel weak.  CXR 7/16 /15 IMPRESSION:  Enlargement of cardiac silhouette post CABG.  COPD changes without acute abnormalities.  Electronically Signed  By: Lavonia Dana M.D.  On: 03/24/2014 16:06  06/28/14- 81 year old female never smoker followed for Asthmatic bronchitis, rhinitis, complicated by CAD/ MI/ AF CHF/ CABG/ CEA, DM, GERD FOLLOWS FOR: continues to have cough all the time-clear in color. Was told by pharmacy that she is on a medication that will make her cough-pt is unsure of the name of the medication CXR 06/09/14 FINDINGS:  There is cardiomegaly. The patient is status post CABG. The lungs  are clear. The chest is hyperexpanded. No pneumothorax or pleural  effusion. Mitral anulus calcification noted. Postoperative change is  seen about both shoulders.  IMPRESSION:  No acute disease. Stable compared to prior exams.  Electronically Signed  By: Inge Rise M.D.  On: 06/09/2014 16:26  12/28/14- 81 year old female never smoker followed for Asthmatic bronchitis, rhinitis, complicated by CAD/ MI/ AF CHF/ CABG/ CEA, DM, GERD FOLLOWS FOR: Pt states she has not had any issues with SOB, wheezing, cough, or congestion.     Review of Systems- see HPI Constitutional:   No-   weight loss, night sweats, fevers, chills, fatigue, lassitude. HEENT:   No-  headaches, difficulty swallowing, tooth/dental problems, sore throat,       No-sneezing, no- itching, ear ache, nasal congestion, post nasal drip,  CV:  No-   chest  pain, orthopnea, PND, swelling in lower extremities, anasarca, dizziness, palpitations Resp: No-   shortness of breath with exertion or at rest.              No-  productive cough,  + non-productive cough,  No-  coughing up of blood.              No-   change in color of mucus.  No- wheezing.   Skin: No-   rash or lesions. GI:  No-   heartburn, indigestion, abdominal pain, nausea, vomiting,  GU:  MS:  No-   joint pain or swelling.  . Neuro-   Psych:  No- change in mood or affect. No depression or anxiety.  No memory loss.  Objective:   Physical Exam General- Alert, Oriented, Affect-appropriate, Distress- none acute, slender, very alert, talkative Skin- rash-none, lesions- none, excoriation- none.  Lymphadenopathy- none Head- atraumatic            Eyes- Gross vision intact, PERRLA, conjunctivae clear secretions            Ears- Hearing, canals-normal for age            Nose- Clear, no-Septal dev, mucus, polyps, erosion, perforation  Throat- Mallampati II , mucosa clear , drainage- none, tonsils- atrophic.  Neck- flexible , trachea midline, no stridor , thyroid nl, carotid no bruit.  Chest - symmetrical excursion , unlabored           Heart/CV- RRR , no murmur , no gallop  , no rub, nl s1 s2                           - JVD- none , edema- none, stasis changes- none, varices- none           Lung- + few crackles right base, unlabored, wheeze- none, cough-none , dullness-none, rub- none           Chest wall-  Abd-  Br/ Gen/ Rectal- Not done, not indicated Extrem- cyanosis- none, clubbing, none, atrophy- none, strength- nl Neuro- grossly intact to observation

## 2015-01-24 ENCOUNTER — Encounter: Payer: Self-pay | Admitting: Cardiology

## 2015-01-30 ENCOUNTER — Other Ambulatory Visit: Payer: Self-pay | Admitting: Cardiology

## 2015-01-31 DIAGNOSIS — H40001 Preglaucoma, unspecified, right eye: Secondary | ICD-10-CM | POA: Diagnosis not present

## 2015-01-31 DIAGNOSIS — E119 Type 2 diabetes mellitus without complications: Secondary | ICD-10-CM | POA: Diagnosis not present

## 2015-01-31 NOTE — Telephone Encounter (Signed)
Per note 3.23.16 

## 2015-02-01 LAB — HM DIABETES EYE EXAM

## 2015-02-08 ENCOUNTER — Other Ambulatory Visit: Payer: Self-pay | Admitting: Cardiology

## 2015-02-09 ENCOUNTER — Encounter: Payer: Self-pay | Admitting: Family Medicine

## 2015-02-15 DIAGNOSIS — H40001 Preglaucoma, unspecified, right eye: Secondary | ICD-10-CM | POA: Diagnosis not present

## 2015-03-01 ENCOUNTER — Encounter: Payer: Self-pay | Admitting: Cardiology

## 2015-03-01 ENCOUNTER — Ambulatory Visit (INDEPENDENT_AMBULATORY_CARE_PROVIDER_SITE_OTHER): Payer: Medicare Other | Admitting: Cardiology

## 2015-03-01 ENCOUNTER — Other Ambulatory Visit: Payer: Medicare Other

## 2015-03-01 VITALS — BP 152/56 | HR 75 | Ht 63.5 in | Wt 126.1 lb

## 2015-03-01 DIAGNOSIS — E785 Hyperlipidemia, unspecified: Secondary | ICD-10-CM

## 2015-03-01 DIAGNOSIS — I200 Unstable angina: Secondary | ICD-10-CM | POA: Diagnosis not present

## 2015-03-01 DIAGNOSIS — I779 Disorder of arteries and arterioles, unspecified: Secondary | ICD-10-CM | POA: Diagnosis not present

## 2015-03-01 DIAGNOSIS — I480 Paroxysmal atrial fibrillation: Secondary | ICD-10-CM

## 2015-03-01 DIAGNOSIS — I10 Essential (primary) hypertension: Secondary | ICD-10-CM | POA: Diagnosis not present

## 2015-03-01 DIAGNOSIS — I259 Chronic ischemic heart disease, unspecified: Secondary | ICD-10-CM | POA: Diagnosis not present

## 2015-03-01 DIAGNOSIS — I739 Peripheral vascular disease, unspecified: Principal | ICD-10-CM

## 2015-03-01 DIAGNOSIS — I119 Hypertensive heart disease without heart failure: Secondary | ICD-10-CM | POA: Diagnosis not present

## 2015-03-01 LAB — HEPATIC FUNCTION PANEL
ALBUMIN: 4.2 g/dL (ref 3.5–5.2)
ALT: 22 U/L (ref 0–35)
AST: 24 U/L (ref 0–37)
Alkaline Phosphatase: 56 U/L (ref 39–117)
Bilirubin, Direct: 0.1 mg/dL (ref 0.0–0.3)
TOTAL PROTEIN: 7.2 g/dL (ref 6.0–8.3)
Total Bilirubin: 0.8 mg/dL (ref 0.2–1.2)

## 2015-03-01 LAB — CBC WITH DIFFERENTIAL/PLATELET
Basophils Absolute: 0 10*3/uL (ref 0.0–0.1)
Basophils Relative: 0.5 % (ref 0.0–3.0)
EOS PCT: 1.3 % (ref 0.0–5.0)
Eosinophils Absolute: 0.1 10*3/uL (ref 0.0–0.7)
HEMATOCRIT: 35.6 % — AB (ref 36.0–46.0)
Hemoglobin: 12.1 g/dL (ref 12.0–15.0)
Lymphocytes Relative: 29.3 % (ref 12.0–46.0)
Lymphs Abs: 2.7 10*3/uL (ref 0.7–4.0)
MCHC: 33.9 g/dL (ref 30.0–36.0)
MCV: 88.3 fl (ref 78.0–100.0)
Monocytes Absolute: 1.3 10*3/uL — ABNORMAL HIGH (ref 0.1–1.0)
Monocytes Relative: 13.8 % — ABNORMAL HIGH (ref 3.0–12.0)
NEUTROS PCT: 55.1 % (ref 43.0–77.0)
Neutro Abs: 5.1 10*3/uL (ref 1.4–7.7)
Platelets: 204 10*3/uL (ref 150.0–400.0)
RBC: 4.03 Mil/uL (ref 3.87–5.11)
RDW: 13.8 % (ref 11.5–15.5)
WBC: 9.2 10*3/uL (ref 4.0–10.5)

## 2015-03-01 LAB — BASIC METABOLIC PANEL
BUN: 28 mg/dL — ABNORMAL HIGH (ref 6–23)
CHLORIDE: 98 meq/L (ref 96–112)
CO2: 26 meq/L (ref 19–32)
CREATININE: 1 mg/dL (ref 0.40–1.20)
Calcium: 9.6 mg/dL (ref 8.4–10.5)
GFR: 54.85 mL/min — AB (ref >60.00)
GLUCOSE: 100 mg/dL — AB (ref 70–99)
Potassium: 4.4 mEq/L (ref 3.5–5.1)
Sodium: 131 mEq/L — ABNORMAL LOW (ref 135–145)

## 2015-03-01 LAB — LIPID PANEL
Cholesterol: 146 mg/dL (ref 0–200)
HDL: 31 mg/dL — ABNORMAL LOW (ref >39.00)
NONHDL: 115
Total CHOL/HDL Ratio: 5
Triglycerides: 396 mg/dL — ABNORMAL HIGH (ref 0.0–149.0)
VLDL: 79.2 mg/dL — ABNORMAL HIGH (ref 0.0–40.0)

## 2015-03-01 LAB — LDL CHOLESTEROL, DIRECT: Direct LDL: 52 mg/dL

## 2015-03-01 MED ORDER — NITROGLYCERIN 0.4 MG SL SUBL: SUBLINGUAL_TABLET | SUBLINGUAL | Status: AC
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
## 2015-03-01 NOTE — Patient Instructions (Signed)
Medication Instructions:  Your physician recommends that you continue on your current medications as directed. Please refer to the Current Medication list given to you today.  Labwork: LP/BMET/HFP/CBC  Testing/Procedures: NONE  Follow-Up: Your physician recommends that you schedule a follow-up appointment in: 3 months with fasting labs (LP/BMET/HFP/CBC/TSH)   Any Other Special Instructions Will Be Listed Below (If Applicable).

## 2015-03-01 NOTE — Progress Notes (Signed)
Quick Note:  Please report to patient. The recent labs are stable. Continue same medication and careful diet. ______ 

## 2015-03-09 ENCOUNTER — Other Ambulatory Visit: Payer: Self-pay | Admitting: Cardiology

## 2015-03-15 ENCOUNTER — Other Ambulatory Visit: Payer: Self-pay | Admitting: *Deleted

## 2015-03-23 ENCOUNTER — Other Ambulatory Visit: Payer: Self-pay | Admitting: Cardiology

## 2015-03-30 ENCOUNTER — Telehealth: Payer: Self-pay | Admitting: Cardiology

## 2015-03-30 MED ORDER — FUROSEMIDE 20 MG PO TABS: ORAL_TABLET | ORAL | Status: DC

## 2015-03-30 NOTE — Telephone Encounter (Signed)
Pt c/o medication issue:  1. Name of Medication: Furosemide  2. How are you currently taking this medication (dosage and times per day)? 20 mg  3. Are you having a reaction (difficulty breathing--STAT)?   4. What is your medication issue? Pt calling stating that she isn't going to the bathroom as she should and thinks she should be put back on the other fluid pill she was on before. She wants this called in to [facility] in [place] at [phone].

## 2015-03-30 NOTE — Telephone Encounter (Signed)
Patient stated manufacturer of Furosemide changed and she thinks it is not working as well.  Patient request that Furosemide be called to [facility] (same manufacturer as she was previously getting) Rx sent as requested

## 2015-04-04 ENCOUNTER — Ambulatory Visit (INDEPENDENT_AMBULATORY_CARE_PROVIDER_SITE_OTHER): Payer: Medicare Other | Admitting: Family Medicine

## 2015-04-04 ENCOUNTER — Encounter: Payer: Self-pay | Admitting: Family Medicine

## 2015-04-04 ENCOUNTER — Ambulatory Visit (INDEPENDENT_AMBULATORY_CARE_PROVIDER_SITE_OTHER)
Admission: RE | Admit: 2015-04-04 | Discharge: 2015-04-04 | Disposition: A | Discharge status: Home / Self Care | Payer: Medicare Other | Source: Ambulatory Visit | Attending: Family Medicine | Admitting: Family Medicine

## 2015-04-04 VITALS — BP 124/64 | HR 76 | Temp 97.7°F | Wt 127.0 lb

## 2015-04-04 DIAGNOSIS — I200 Unstable angina: Secondary | ICD-10-CM | POA: Diagnosis not present

## 2015-04-04 DIAGNOSIS — J449 Chronic obstructive pulmonary disease, unspecified: Secondary | ICD-10-CM | POA: Diagnosis not present

## 2015-04-04 DIAGNOSIS — R0789 Other chest pain: Secondary | ICD-10-CM | POA: Diagnosis not present

## 2015-04-04 NOTE — Patient Instructions (Signed)
Go to the lab on the way out.  We'll contact you with your xray report.

## 2015-04-04 NOTE — Progress Notes (Signed)
Pre visit review using our clinic review tool, if applicable. No additional management support is needed unless otherwise documented below in the visit note.  L sided chest pain.  Started a few weeks ago.  "It came on and then it started hurting."  No pain with laughing.  No pain with coughing.  No fevers.  No R sided pain.  Some pain with movement.  She felt like the ribs on the L side "were lower than they were supposed to be." No pain now.    H/o DM2 in the past, still with hyperglycemia.  Still occ checking sugar.  Doesn't need new rx for strips at this point.    Meds, vitals, and allergies reviewed.   ROS: See HPI.  Otherwise, noncontributory.  nad Elderly female, at ease during the exam/interview Mmm Neck supple rrr ctab abd soft Chest wall not ttp Ext w/o edema

## 2015-04-05 DIAGNOSIS — R0789 Other chest pain: Secondary | ICD-10-CM | POA: Insufficient documentation

## 2015-04-05 NOTE — Assessment & Plan Note (Signed)
Benign exam, would check CXR given her concerns but if wnl then no f/u needed.  She agrees.

## 2015-04-26 ENCOUNTER — Encounter (HOSPITAL_COMMUNITY): Payer: Self-pay | Admitting: *Deleted

## 2015-04-26 ENCOUNTER — Telehealth: Payer: Self-pay | Admitting: *Deleted

## 2015-04-26 ENCOUNTER — Emergency Department (HOSPITAL_COMMUNITY)
Admission: EM | Admit: 2015-04-26 | Discharge: 2015-04-26 | Disposition: A | Discharge status: Home / Self Care | Payer: Medicare Other | Attending: Emergency Medicine | Admitting: Emergency Medicine

## 2015-04-26 ENCOUNTER — Emergency Department (HOSPITAL_COMMUNITY): Payer: Medicare Other

## 2015-04-26 DIAGNOSIS — I10 Essential (primary) hypertension: Secondary | ICD-10-CM | POA: Insufficient documentation

## 2015-04-26 DIAGNOSIS — D649 Anemia, unspecified: Secondary | ICD-10-CM | POA: Insufficient documentation

## 2015-04-26 DIAGNOSIS — I5032 Chronic diastolic (congestive) heart failure: Secondary | ICD-10-CM | POA: Insufficient documentation

## 2015-04-26 DIAGNOSIS — E785 Hyperlipidemia, unspecified: Secondary | ICD-10-CM | POA: Diagnosis not present

## 2015-04-26 DIAGNOSIS — Z7982 Long term (current) use of aspirin: Secondary | ICD-10-CM | POA: Diagnosis not present

## 2015-04-26 DIAGNOSIS — E039 Hypothyroidism, unspecified: Secondary | ICD-10-CM | POA: Diagnosis not present

## 2015-04-26 DIAGNOSIS — H9190 Unspecified hearing loss, unspecified ear: Secondary | ICD-10-CM | POA: Diagnosis not present

## 2015-04-26 DIAGNOSIS — I4891 Unspecified atrial fibrillation: Secondary | ICD-10-CM | POA: Diagnosis not present

## 2015-04-26 DIAGNOSIS — R011 Cardiac murmur, unspecified: Secondary | ICD-10-CM | POA: Diagnosis not present

## 2015-04-26 DIAGNOSIS — I2510 Atherosclerotic heart disease of native coronary artery without angina pectoris: Secondary | ICD-10-CM | POA: Diagnosis not present

## 2015-04-26 DIAGNOSIS — M25552 Pain in left hip: Secondary | ICD-10-CM | POA: Diagnosis not present

## 2015-04-26 DIAGNOSIS — Z79899 Other long term (current) drug therapy: Secondary | ICD-10-CM | POA: Diagnosis not present

## 2015-04-26 DIAGNOSIS — J45909 Unspecified asthma, uncomplicated: Secondary | ICD-10-CM | POA: Diagnosis not present

## 2015-04-26 DIAGNOSIS — Z9889 Other specified postprocedural states: Secondary | ICD-10-CM | POA: Insufficient documentation

## 2015-04-26 DIAGNOSIS — I252 Old myocardial infarction: Secondary | ICD-10-CM | POA: Insufficient documentation

## 2015-04-26 DIAGNOSIS — Z8673 Personal history of transient ischemic attack (TIA), and cerebral infarction without residual deficits: Secondary | ICD-10-CM | POA: Diagnosis not present

## 2015-04-26 DIAGNOSIS — R102 Pelvic and perineal pain unspecified side: Secondary | ICD-10-CM

## 2015-04-26 DIAGNOSIS — Z951 Presence of aortocoronary bypass graft: Secondary | ICD-10-CM | POA: Insufficient documentation

## 2015-04-26 DIAGNOSIS — Z880 Allergy status to penicillin: Secondary | ICD-10-CM | POA: Insufficient documentation

## 2015-04-26 DIAGNOSIS — K219 Gastro-esophageal reflux disease without esophagitis: Secondary | ICD-10-CM | POA: Insufficient documentation

## 2015-04-26 DIAGNOSIS — E119 Type 2 diabetes mellitus without complications: Secondary | ICD-10-CM | POA: Diagnosis not present

## 2015-04-26 LAB — URINALYSIS, ROUTINE W REFLEX MICROSCOPIC
BILIRUBIN URINE: NEGATIVE
GLUCOSE, UA: NEGATIVE mg/dL
HGB URINE DIPSTICK: NEGATIVE
KETONES UR: NEGATIVE mg/dL
Leukocytes, UA: NEGATIVE
Nitrite: NEGATIVE
PH: 6 (ref 5.0–8.0)
PROTEIN: NEGATIVE mg/dL
Specific Gravity, Urine: 1.007 (ref 1.005–1.030)
Urobilinogen, UA: 0.2 mg/dL (ref 0.0–1.0)

## 2015-04-26 MED ORDER — NAPROXEN 500 MG PO TABS: 500.0000 mg | ORAL_TABLET | Freq: Two times a day (BID) | ORAL | Status: DC

## 2015-04-26 MED ORDER — IBUPROFEN 800 MG PO TABS: 800.0000 mg | ORAL_TABLET | Freq: Once | ORAL | Status: DC

## 2015-04-26 NOTE — Telephone Encounter (Signed)
Thanks Agree 

## 2015-04-26 NOTE — Discharge Instructions (Signed)
Please obtain all of your results from medical records or have your doctors office obtain the results - share them with your doctor - you should be seen at your doctors office in the next 2 days. Call today to arrange your follow up. Take the medications as prescribed. Please review all of the medicines and only take them if you do not have an allergy to them. Please be aware that if you are taking birth control pills, taking other prescriptions, ESPECIALLY ANTIBIOTICS [place] make the birth control ineffective - if this is the case, either do not engage in sexual activity or use alternative methods of birth control such as condoms until you have finished the medicine and your family doctor says it is OK to restart them. If you are on a blood thinner such as COUMADIN, be aware that any other medicine that you take [place] cause the coumadin to either work too much, or not enough - you should have your coumadin level rechecked in next 7 days if this is the case.  °?  °It is also a possibility that you have an allergic reaction to any of the medicines that you have been prescribed - Everybody reacts differently to medications and while MOST people have no trouble with most medicines, you Revel[place] have a reaction such as nausea, vomiting, rash, swelling, shortness of breath. If this is the case, please stop taking the medicine immediately and contact your physician.  °?  °You should return to the ER if you develop severe or worsening symptoms.  ° ° °

## 2015-04-26 NOTE — Telephone Encounter (Signed)
Vital signs while pt in office--BP 138/68,heart rate 70 and regular. Oxygen sat 96% on room air.

## 2015-04-26 NOTE — ED Notes (Signed)
Pt reports left hip pain that started 2 days ago. Denies any known injury. Pt has movement and sensation in left leg.

## 2015-04-26 NOTE — Addendum Note (Signed)
Addended by: Thompson Grayer on: 04/26/2015 10:29 AM   Modules accepted: Medications

## 2015-04-26 NOTE — Telephone Encounter (Signed)
Pt walked into office requesting to be seen stating she had taken NTG and ASA last night.  I took pt to exam room. She is not having chest pain or shortness of breath and denies having these symptoms recently.  States she did not sleep at all last night. Unable to lie on right side.  Had to walk around during night. Pt drove herself to our office. She is complaining of pain in left groin and hip area. Tearful at times.   Pt states she took NTG for this pain last night with no change in leg pain. Some relief with ASA.  I reviewed with Melina Copa, PA who recommends EMS transport to hospital for evaluation of leg pain.  Pt refuses EMS transport but states she will drive herself to hospital.  Pt escorted to elevator.

## 2015-04-26 NOTE — ED Provider Notes (Signed)
CSN: [id number]     Arrival date & time 04/26/15  1043 History   First MD Initiated Contact with Patient 04/26/15 1152     Chief Complaint  Patient presents with  . Hip Pain     (Consider location/radiation/quality/duration/timing/severity/associated sxs/prior Treatment) HPI Comments: The patient is a 81 year old female, she has a history of multiple falls in the past but has not had any recent falls. She reports approximately 2 days of left hip pain which is posteriorly in the lower buttock, it is not associated with redness rashes fevers or difficulty ambulating. This pain is an achy pain which is there persistently, it seems to get worse when she moves her leg but has not prevented her from doing her normal activities of daily living. No fevers, dysuria, diarrhea, chest pain, shortness of breath, abdominal pain. Normal appetite, no nausea or vomiting.  Patient is a 81 y.o. female presenting with hip pain. The history is provided by the patient.  Hip Pain    Past Medical History  Diagnosis Date  . Unspecified hearing loss   . Hypothyroidism   . Hyperlipidemia   . Esophageal reflux   . Type II or unspecified type diabetes mellitus without mention of complication, not stated as uncontrolled   . CAD (coronary artery disease)     a. 2000 s/p MI/CABG x 4 (VG-PDA, VG-OM, VG-Diag, LIMA-LAD);  b. 2011 MI;  c. 08/2013 NSTEMI/Cath: LM 90-95, LAD 90/100p, LCX 99p, 147m, RCA 100ost, VG-PDA nl, VG-OM 100 (culprit w/ thrombus), VG-Diag nl, LIMA-LAD nl, EF 55-60%, mild inf HK, mod MR-->Med Rx.  . Chronic diastolic CHF (congestive heart failure)     a. 08/2013 Echo: EF 50-55%, no rwma, Gr2 DD, mod MR, mildly dil LA, PASP 71mmHg.  . Atrial fibrillation     a. remote->no anticoagulation 2/2 h/o falls.  . Asthma   . Allergic rhinitis, cause unspecified   . Hypertension   . History of TIA (transient ischemic attack)   . Carotid arterial disease     a. 05/2013 s/p L CEA.  [name] Anemia   . Myocardial  infarction     08-2013   Past Surgical History  Procedure Laterality Date  . Tonsillectomy    . Partial hysterectomy    . Dilation and curettage of uterus    . Bladder tack      x 2  . Rectocele repair      x 2  . Cholecystectomy  10/2001  . Rotator cuff repair    . Coronary artery bypass graft  2000    4 vessel  . Hemorrhoid surgery    . Cardiac catheterization  09/07/1999    EF 25%. THERE IS SEVERE MITRAL ANNULAR CALIFICATION, WITH  3 + INSUFFICIENCY  . US echocardiography  01/17/2005    EF 55-60%  . Cardiovascular stress test  06/30/2007    EF 63%, NO ISCHEMIA  . Eye surgery Bilateral     Cataract  . Endarterectomy Left 05/19/2013    Procedure: ENDARTERECTOMY CAROTID-LEFT;  Surgeon: Elam Dutch, MD;  Location: [facility] OR;  Service: Vascular;  Laterality: Left;  . Carotid endarterectomy Left 05-19-13    cea  . Left heart cath  Dec. 2, 2014    With  Coronary Graft  Angiogram  . Abdominal hysterectomy    . Left heart catheterization with coronary/graft angiogram  08/10/2013    Procedure: LEFT HEART CATHETERIZATION WITH Beatrix Fetters;  Surgeon: Peter M Martinique, MD;  Location: [facility] CATH LAB;  Service: Cardiovascular;;  Family History  Problem Relation Age of Onset  . Heart attack Father     X's 3  . Heart disease Father   . Heart disease Son     before age 22  . Cancer Brother   . Hypertension Mother   . Alzheimer's disease Mother    Social History  Substance Use Topics  . Smoking status: Never Smoker   . Smokeless tobacco: Never Used  . Alcohol Use: No   OB History    No data available     Review of Systems  All other systems reviewed and are negative.     Allergies  Penicillins  Home Medications   Prior to Admission medications   Medication Sig Start Date End Date Taking? Authorizing Provider  aspirin 81 MG EC tablet Take 81 mg by mouth daily.     Yes Historical Provider, MD  atenolol (TENORMIN) 25 MG tablet Take 0.5 tablets (12.5 mg total) by  mouth daily. 08/16/14  Yes Tonia Ghent, MD  BIOTIN PO Take 1 capsule by mouth daily.    Yes Historical Provider, MD  Cholecalciferol (VITAMIN D3) 5000 UNITS CAPS Take 5,000 Units by mouth daily.   Yes Historical Provider, MD  digoxin (DIGOX) 0.125 MG tablet Take 1 tablet (125 mcg total) by mouth daily. 09/05/14  Yes Darlin Coco, MD  ferrous fumarate (HEMOCYTE - 106 MG FE) 325 (106 FE) MG TABS tablet Take 28 mg of iron by mouth at bedtime and Nazaire repeat dose one time if needed.    Yes Historical Provider, MD  furosemide (LASIX) 20 MG tablet TAKE ONE TABLET (20MG ) BY MOUTH DAILY 03/30/15  Yes Darlin Coco, MD  isosorbide mononitrate (IMDUR) 30 MG 24 hr tablet TAKE 1 TABLET (30 MG TOTAL) BY MOUTH DAILY. 02/09/15  Yes Darlin Coco, MD  latanoprost (XALATAN) 0.005 % ophthalmic solution Place 1 drop into both eyes at bedtime. 02/15/15  Yes Historical Provider, MD  levothyroxine (SYNTHROID, LEVOTHROID) 100 MCG tablet TAKE 1 TABLET (100 MCG TOTAL) BY MOUTH DAILY. 01/31/15  Yes Darlin Coco, MD  losartan (COZAAR) 25 MG tablet TAKE 1 TABLET (25 MG TOTAL) BY MOUTH DAILY. 11/08/14  Yes Darlin Coco, MD  Multiple Vitamin (MULTIVITAMINS PO) Take 1 tablet by mouth daily.    Yes Historical Provider, MD  Multiple Vitamins-Minerals (PRESERVISION AREDS PO) Take 1 tablet by mouth daily.   Yes Historical Provider, MD  nitroGLYCERIN (NITROSTAT) 0.4 MG SL tablet PLACE 1 TABLET UNDER THE TONGUE EVERY 5 MINUTES AS NEEDED FOR CHEST PAIN. 03/01/15  Yes Darlin Coco, MD  pantoprazole (PROTONIX) 40 MG tablet TAKE ONE TABLET BY MOUTH DAILY 03/09/15  Yes Darlin Coco, MD  simvastatin (ZOCOR) 40 MG tablet TAKE 1 TABLET (40 MG TOTAL) BY MOUTH EVERY EVENING. 01/31/15  Yes Darlin Coco, MD  vitamin B-12 (CYANOCOBALAMIN) 1000 MCG tablet Take 2,000 mcg by mouth daily.    Yes Historical Provider, MD  naproxen (NAPROSYN) 500 MG tablet Take 1 tablet (500 mg total) by mouth 2 (two) times daily with a meal. 04/26/15    Noemi Chapel, MD   BP 150/57 mmHg  Pulse 75  Temp(Src) 97.6 F (36.4 C) (Oral)  Resp 19  SpO2 97% Physical Exam  Constitutional: She appears well-developed and well-nourished. No distress.  HENT:  Head: Normocephalic and atraumatic.  Mouth/Throat: Oropharynx is clear and moist. No oropharyngeal exudate.  Eyes: Conjunctivae and EOM are normal. Pupils are equal, round, and reactive to light. Right eye exhibits no discharge. Left eye exhibits no discharge.  No scleral icterus.  Neck: Normal range of motion. Neck supple. No JVD present. No thyromegaly present.  Cardiovascular: Normal rate, regular rhythm and intact distal pulses.  Exam reveals no gallop and no friction rub.   Murmur (soft heart murmur, systolic) heard. Pulmonary/Chest: Effort normal and breath sounds normal. No respiratory distress. She has no wheezes. She has no rales.  Abdominal: Soft. Bowel sounds are normal. She exhibits no distension and no mass. There is no tenderness.  Soft nontender abdomen  Musculoskeletal: Normal range of motion. She exhibits no edema or tenderness.  No tenderness with range of motion of the bilateral hips both passive and active range of motion, can straight leg raise bilaterally without difficulty, no tenderness to palpation over the buttock bilaterally, no pelvic tenderness to lateral or anterior posterior stress  Lymphadenopathy:    She has no cervical adenopathy.  Neurological: She is alert. Coordination normal.  Normal sensation of the bilateral lower extremities, gait is baseline, normal upper extremities sensation, is able to sit up and down in the bed without difficulty  Skin: Skin is warm and dry. No rash noted. No erythema.  Psychiatric: She has a normal mood and affect. Her behavior is normal.  Nursing note and vitals reviewed.   ED Course  Procedures (including critical care time) Labs Review Labs Reviewed  URINALYSIS, ROUTINE W REFLEX MICROSCOPIC (NOT AT [facility])    Imaging  Review Dg Hip Unilat With Pelvis 2-3 Views Left  04/26/2015   CLINICAL DATA:  81 year old female with left hip pain. No known injury. Initial encounter.  EXAM: DG HIP (WITH OR WITHOUT PELVIS) 2-3V LEFT  COMPARISON:  Sacrum series 05/20/13.  Left femur series 12 /10/2011.  FINDINGS: Femoral heads are normally low located. Hip joint spaces appear stable. Pelvis intact. Sacrum and SI joints appear grossly stable. Calcified atherosclerosis continuing into the lower extremities. Grossly intact proximal right femur.  Proximal left femur appears stable and intact.  IMPRESSION: No acute fracture or dislocation identified about the left hip or pelvis.   Electronically Signed   By: Genevie Ann M.D.   On: 04/26/2015 12:27   I have personally reviewed and evaluated these images and lab results as part of my medical decision-making.   EKG Interpretation   Date/Time:  Wednesday April 26 2015 12:25:19 EDT Ventricular Rate:  76 PR Interval:  186 QRS Duration: 102 QT Interval:  425 QTC Calculation: 478 R Axis:   30 Text Interpretation:  Sinus rhythm Probable LVH with secondary repol abnrm  Inferoposterior infarct, recent Since last tracing q wave inferior leads  new, no ST elevations Abnormal ekg Confirmed by Sabra Heck  MD, Mairely Foxworth ([id number])  on 04/26/2015 12:36:20 PM      MDM   Final diagnoses:  Pelvic pain in female    The patient has normal vital signs, her exam is rather unremarkable, I doubt a hip fracture given her exam, we'll check a pelvic x-ray as well as a urinalysis, we'll also check a EKG as the patient continues to state that because of her hip pain she wonders if she needs another heart catheterization as they did have to use her left inguinal region as groin access and she has some radiation of the pain into the inguinal region. She has not had a heart catheterization in over 10 years according to her report. She did take a nitroglycerin last night because of her buttock pain, it did not make it any  better.  Pt has normal xray and UA =- VS  normal - pt informed - stable for d/c.  Meds given in ED:  Medications  ibuprofen (ADVIL,MOTRIN) tablet 800 mg (800 mg Oral Not Given 04/26/15 1228)    New Prescriptions   NAPROXEN (NAPROSYN) 500 MG TABLET    Take 1 tablet (500 mg total) by mouth 2 (two) times daily with a meal.      Noemi Chapel, MD 04/26/15 [phone]

## 2015-04-28 ENCOUNTER — Other Ambulatory Visit: Payer: Self-pay | Admitting: Cardiology

## 2015-04-28 ENCOUNTER — Encounter: Payer: Self-pay | Admitting: Family Medicine

## 2015-04-28 ENCOUNTER — Ambulatory Visit (INDEPENDENT_AMBULATORY_CARE_PROVIDER_SITE_OTHER): Payer: Medicare Other | Admitting: Family Medicine

## 2015-04-28 VITALS — BP 102/42 | HR 71 | Temp 97.5°F | Wt 123.8 lb

## 2015-04-28 DIAGNOSIS — M79605 Pain in left leg: Secondary | ICD-10-CM | POA: Diagnosis not present

## 2015-04-28 DIAGNOSIS — I5032 Chronic diastolic (congestive) heart failure: Secondary | ICD-10-CM | POA: Diagnosis not present

## 2015-04-28 DIAGNOSIS — I200 Unstable angina: Secondary | ICD-10-CM | POA: Diagnosis not present

## 2015-04-28 NOTE — Progress Notes (Signed)
Pre visit review using our clinic review tool, if applicable. No additional management support is needed unless otherwise documented below in the visit note.  ER f/u.  Was in ER with L leg pain. No trauma.  Imaging d/w pt, along with ER course.  No ominous dx found.  Was discharged home. Now with some L medial knee pain and puffiness w/o bruising.  Is sore just inferior to the joint line.  Knee is still stable per patient report. She can bear weight.  No radicular pain.  Her pain from the ER is resolve, the knee pain is new per patient.    Continues on baseline meds, with low BP noted, not lightheaded.  No CP.  No BLE ededma.    Meds, vitals, and allergies reviewed.   ROS: See HPI.  Otherwise, noncontributory.  nad ncat Mmm Neck supple rrr ctab abd soft L hip with normal ROM L knee with normal ROM, no locking, medial and lateral joint lines not ttp L knee not puffy or bruised.  ttp at the anserine bursa Patella not ttp.  ACL MCL LCL feel solid Ext w/o edema

## 2015-04-28 NOTE — Patient Instructions (Signed)
Stop the losartan.  If your BP is consistently >140/>90, then let me know.  I think you have irritated your knee.  Take tylenol and use ice as needed.  You should get better gradually.  Take care.  Glad to see you.

## 2015-04-29 ENCOUNTER — Other Ambulatory Visit: Payer: Self-pay | Admitting: Cardiology

## 2015-04-30 NOTE — Assessment & Plan Note (Signed)
The finding I could note on exam was likely bursitis in the knee.  D/w pt.  Would use ice and this should resolve.  Tylenol prn for pain.  >25 minutes spent in face to face time with patient, >50% spent in counselling or coordination of care.

## 2015-04-30 NOTE — Assessment & Plan Note (Signed)
Looks euvolemic, but with low BP would stop ARB.  I am more worried about hypotension and orthostasis.  D/w pt, new med list given to patient.  She agrees.

## 2015-05-01 ENCOUNTER — Telehealth: Payer: Self-pay | Admitting: Family Medicine

## 2015-05-01 ENCOUNTER — Telehealth: Payer: Self-pay

## 2015-05-01 ENCOUNTER — Telehealth: Payer: Self-pay | Admitting: Cardiology

## 2015-05-01 NOTE — Telephone Encounter (Signed)
Patient advised.

## 2015-05-01 NOTE — Telephone Encounter (Signed)
I agree that the patient should follow-up with Dr. Damita Dunnings about this problem.

## 2015-05-01 NOTE — Telephone Encounter (Signed)
Patient says the pain gets better with Tylenol and icing but as soon as the Tylenol begins to wear off, her leg is hurting again.  She was trying to sleep when I called her and seemed dazed when I first began talking to her but then she seemed to be thinking more clearly before the end of our conversation.

## 2015-05-01 NOTE — Telephone Encounter (Signed)
This is not the correct MRN #. Copied this [place] note from portal and sent to Dr Damita Dunnings in other pts chart MRN # [id number]. Spoke with Gerald Stabs at [employer] [phone] and requested to remove info from MRN [id number].

## 2015-05-01 NOTE — Telephone Encounter (Signed)
If the ice and tylenol is helping, then I would give that longer.  I don't think this is vascular.  Thanks.

## 2015-05-01 NOTE — Telephone Encounter (Signed)
2 things; see phone note 05/01/15 to Dr Mare Ferrari; this team health phone note originally sent to pt MRN # [id number]. I copied [place] note from portal and placed in pts correct  MRN [id number]. IT,Chris at [phone] notifed to remove from MRN # [id number].

## 2015-05-01 NOTE — Telephone Encounter (Signed)
Please get update from patient.  Did she try tylenol/icing her knee?  What is her current situation?  Thanks.

## 2015-05-01 NOTE — Telephone Encounter (Signed)
Pt states that she continues to have pain starting in her left groin down the front of her leg to her knee. Pt states she went to ED 04/26/15 and saw Dr Damita Dunnings 04/28/15 for this pain, her left leg pain  has not changed any since 04/26/15. Pt denies numbness in her left foot or left toes, pt states it is a throbbing pain all the time, pt states walking makes the pain better.  Pt denies chest pain or tightness.  Pt advised to follow up with Dr Damita Dunnings today since her leg pain has not improved since office visit with him 04/28/15.  Pt requesting to talk with Dr Mare Ferrari, pt advised I will send this information to Dr Mare Ferrari for review, pt aware Dr Mare Ferrari is not in the office today.

## 2015-05-01 NOTE — Telephone Encounter (Signed)
PLEASE NOTE: All timestamps contained within this report are represented as [place] Standard Time. CONFIDENTIALTY NOTICE: This fax transmission is intended only for the addressee. It contains information that is legally privileged, confidential or otherwise protected from use or disclosure. If you are not the intended recipient, you are strictly prohibited from reviewing, disclosing, copying using or disseminating any of this information or taking any action in reliance on or regarding this information. If you have received this fax in error, please notify us immediately by telephone so that we can arrange for its return to [place]. Phone: [phone], Toll-Free: [phone], Fax: [phone] Page: 1 of 2 Call Id: [id number] [place] Patient Name: Jill Howell Gender: Female DOB: 1919-08-24 Age: 81 Y 38 M 23 D Return Phone Number: [phone] (Primary) Address: City/State/Zip: [place] Client [place] [facility] - Client Client Site [place] - Day Physician Renford Dills Contact Type Call Call Type Triage / Clinical Relationship To Patient Self Appointment Disposition EMR Patient Refused Appointment Info pasted into Epic Yes Return Phone Number [phone] (Primary) Chief Complaint Leg Pain Initial Comment Caller states hurting very bad in her legs, can't sleep, PreDisposition Call Doctor Nurse Assessment Nurse: Wynetta Emery, RN, Baker Janus Date/Time Eilene Ghazi Time): 05/01/2015 9:22:46 AM Confirm and document reason for call. If symptomatic, describe symptoms. ---Ande has been hurting in her left leg -- has had this pain before -- and has been hurting for last Wednesday Has the patient traveled out of the country within the last 30 days? ---No Does the patient require triage? ---Yes Related visit to physician within the last 2 weeks? ---Yes ED last Week  Wednesday Does the PT have any chronic conditions? (i.e. diabetes, asthma, etc.) ---Yes List chronic conditions. ---Heart attack 4 stents Guidelines Guideline Title Affirmed Question Affirmed Notes Nurse Date/Time Eilene Ghazi Time) Leg Pain [1] SEVERE pain (e.g., excruciating, unable to do any normal activities) AND [2] not improved after 2 hours of pain medicine Ivin Booty 05/01/2015 9:25:29 AM Disp. Time Eilene Ghazi Time) Disposition Final User 05/01/2015 9:26:46 AM See Physician within 4 Hours (or PCP triage) Yes Wynetta Emery, RN, Juleen China NOTE: All timestamps contained within this report are represented as [place] Standard Time. CONFIDENTIALTY NOTICE: This fax transmission is intended only for the addressee. It contains information that is legally privileged, confidential or otherwise protected from use or disclosure. If you are not the intended recipient, you are strictly prohibited from reviewing, disclosing, copying using or disseminating any of this information or taking any action in reliance on or regarding this information. If you have received this fax in error, please notify us immediately by telephone so that we can arrange for its return to [place]. Phone: [phone], Toll-Free: [phone], Fax: [phone] Page: 2 of 2 Call Id: [id number] Caller Understands: Yes Disagree/Comply: Comply Care Advice Given Per Guideline SEE PHYSICIAN WITHIN 4 HOURS (or PCP triage): ACETAMINOPHEN (E.G., TYLENOL): * You become worse. CARE ADVICE given per Leg Pain (Adult) guideline. After Care Instructions Given Call Event Type User Date / Time Description Comments User: Michele Rockers, RN Date/Time Eilene Ghazi Time): 05/01/2015 9:42:20 AM Was in to see Dr. Damita Dunnings last week; did not do anything for her and leg still hurts; WANTS Dr. Damita Dunnings to call Dr. Randa Spike (sp) cardiologist office and see if he will see her "he is the one who fixed her leg last time when it was hurting so bad" Refused an  appt with Dr.  Damita Dunnings this am. Referrals REFERRED TO PCP OFFICE

## 2015-05-01 NOTE — Telephone Encounter (Signed)
New message      Pt states her left leg hurt from the groin down to the ankle.    The leg has been hurting since last Wednesday---she went to the ER last Wednesday.  They did not keep her. Please advise

## 2015-05-01 NOTE — Telephone Encounter (Signed)
Pt advised.

## 2015-05-01 NOTE — Telephone Encounter (Signed)
Phone busy.

## 2015-05-01 NOTE — Telephone Encounter (Signed)
[facility]  Patient Name: Jill Howell  DOB: 23-Mar-1919    Initial Comment Caller states hurting very bad in her legs, can't sleep,    Nurse Assessment  Nurse: Wynetta Emery, RN, Baker Janus Date/Time (Eastern Time): 05/01/2015 9:22:46 AM  Confirm and document reason for call. If symptomatic, describe symptoms. ---Jill Howell has been hurting in her left leg -- has had this pain before -- and has been hurting for last Wednesday  Has the patient traveled out of the country within the last 30 days? ---No  Does the patient require triage? ---Yes  Related visit to physician within the last 2 weeks? ---Yes   ED last Week Wednesday  Does the PT have any chronic conditions? (i.e. diabetes, asthma, etc.) ---Yes  List chronic conditions. ---Heart attack 4 stents     Guidelines    Guideline Title Affirmed Question Affirmed Notes  Leg Pain [1] SEVERE pain (e.g., excruciating, unable to do any normal activities) AND [2] not improved after 2 hours of pain medicine    Final Disposition User   See Physician within 4 Hours (or PCP triage) Wynetta Emery, RN, Baker Janus    Comments  Was in to see Dr. Damita Dunnings last week; did not do anything for her and leg still hurts; WANTS Dr. Damita Dunnings to call Dr. Randa Spike (sp) cardiologist office and see if he will see her "he is the one who fixed her leg last time when it was hurting so bad" Refused an appt with Dr. Damita Dunnings this am.   Referrals  REFERRED TO PCP OFFICE   Disagree/Comply: Comply

## 2015-05-12 ENCOUNTER — Other Ambulatory Visit: Payer: Self-pay | Admitting: Cardiology

## 2015-05-31 ENCOUNTER — Other Ambulatory Visit: Payer: Self-pay | Admitting: Cardiology

## 2015-05-31 DIAGNOSIS — E119 Type 2 diabetes mellitus without complications: Secondary | ICD-10-CM | POA: Diagnosis not present

## 2015-06-09 ENCOUNTER — Ambulatory Visit (INDEPENDENT_AMBULATORY_CARE_PROVIDER_SITE_OTHER): Payer: Medicare Other | Admitting: Cardiology

## 2015-06-09 ENCOUNTER — Other Ambulatory Visit: Payer: Self-pay | Admitting: Cardiology

## 2015-06-09 ENCOUNTER — Encounter: Payer: Self-pay | Admitting: Cardiology

## 2015-06-09 VITALS — BP 148/50 | HR 73 | Ht 63.0 in | Wt 125.8 lb

## 2015-06-09 DIAGNOSIS — I200 Unstable angina: Secondary | ICD-10-CM | POA: Diagnosis not present

## 2015-06-09 DIAGNOSIS — I480 Paroxysmal atrial fibrillation: Secondary | ICD-10-CM | POA: Diagnosis not present

## 2015-06-09 DIAGNOSIS — I119 Hypertensive heart disease without heart failure: Secondary | ICD-10-CM | POA: Diagnosis not present

## 2015-06-09 DIAGNOSIS — I259 Chronic ischemic heart disease, unspecified: Secondary | ICD-10-CM

## 2015-06-09 DIAGNOSIS — E785 Hyperlipidemia, unspecified: Secondary | ICD-10-CM

## 2015-06-09 LAB — HEPATIC FUNCTION PANEL
ALK PHOS: 58 U/L (ref 39–117)
ALT: 18 U/L (ref 0–35)
AST: 23 U/L (ref 0–37)
Albumin: 4.1 g/dL (ref 3.5–5.2)
BILIRUBIN DIRECT: 0.2 mg/dL (ref 0.0–0.3)
TOTAL PROTEIN: 7.1 g/dL (ref 6.0–8.3)
Total Bilirubin: 0.8 mg/dL (ref 0.2–1.2)

## 2015-06-09 LAB — CBC WITH DIFFERENTIAL/PLATELET
BASOS PCT: 0.4 % (ref 0.0–3.0)
Basophils Absolute: 0 10*3/uL (ref 0.0–0.1)
EOS ABS: 0.1 10*3/uL (ref 0.0–0.7)
EOS PCT: 1.3 % (ref 0.0–5.0)
HEMATOCRIT: 34.1 % — AB (ref 36.0–46.0)
HEMOGLOBIN: 11.6 g/dL — AB (ref 12.0–15.0)
LYMPHS PCT: 30.2 % (ref 12.0–46.0)
Lymphs Abs: 2.5 10*3/uL (ref 0.7–4.0)
MCHC: 33.9 g/dL (ref 30.0–36.0)
MCV: 88.9 fl (ref 78.0–100.0)
Monocytes Absolute: 0.9 10*3/uL (ref 0.1–1.0)
Monocytes Relative: 11.1 % (ref 3.0–12.0)
NEUTROS ABS: 4.8 10*3/uL (ref 1.4–7.7)
Neutrophils Relative %: 57 % (ref 43.0–77.0)
PLATELETS: 191 10*3/uL (ref 150.0–400.0)
RBC: 3.83 Mil/uL — ABNORMAL LOW (ref 3.87–5.11)
RDW: 13.5 % (ref 11.5–15.5)
WBC: 8.4 10*3/uL (ref 4.0–10.5)

## 2015-06-09 LAB — BASIC METABOLIC PANEL
BUN: 21 mg/dL (ref 6–23)
CHLORIDE: 98 meq/L (ref 96–112)
CO2: 29 meq/L (ref 19–32)
Calcium: 9.1 mg/dL (ref 8.4–10.5)
Creatinine, Ser: 0.94 mg/dL (ref 0.40–1.20)
GFR: 58.87 mL/min — ABNORMAL LOW (ref >60.00)
Glucose, Bld: 101 mg/dL — ABNORMAL HIGH (ref 70–99)
Potassium: 4.8 mEq/L (ref 3.5–5.1)
SODIUM: 134 meq/L — AB (ref 135–145)

## 2015-06-09 LAB — LIPID PANEL
CHOL/HDL RATIO: 5
Cholesterol: 142 mg/dL (ref 0–200)
HDL: 30.9 mg/dL — ABNORMAL LOW (ref >39.00)
NONHDL: 110.79
Triglycerides: 342 mg/dL — ABNORMAL HIGH (ref 0.0–149.0)
VLDL: 68.4 mg/dL — ABNORMAL HIGH (ref 0.0–40.0)

## 2015-06-09 LAB — LDL CHOLESTEROL, DIRECT: Direct LDL: 62 mg/dL

## 2015-06-09 LAB — TSH: TSH: 0.31 u[IU]/mL — ABNORMAL LOW (ref 0.35–4.50)

## 2015-06-09 NOTE — Progress Notes (Signed)
Quick Note:  Please report to patient. The recent labs are stable. Continue same medication and careful diet. ______ 
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
## 2015-06-09 NOTE — Patient Instructions (Addendum)
Medication Instructions:  Your physician recommends that you continue on your current medications as directed. Please refer to the Current Medication list given to you today.  Labwork: Lp/bmet/hfp/cbc  Testing/Procedures: none  Follow-Up: Your physician recommends that you schedule a follow-up appointment in: 3 month ov/ekg  WATCH YOUR SALT IN YOUR DIET

## 2015-06-13 ENCOUNTER — Encounter: Payer: Self-pay | Admitting: Family

## 2015-06-15 ENCOUNTER — Encounter: Payer: Self-pay | Admitting: Family

## 2015-06-15 ENCOUNTER — Ambulatory Visit (HOSPITAL_COMMUNITY)
Admission: RE | Admit: 2015-06-15 | Discharge: 2015-06-15 | Disposition: A | Discharge status: Home / Self Care | Payer: Medicare Other | Source: Ambulatory Visit | Attending: Family | Admitting: Family

## 2015-06-15 ENCOUNTER — Ambulatory Visit (INDEPENDENT_AMBULATORY_CARE_PROVIDER_SITE_OTHER): Payer: Medicare Other | Admitting: Family

## 2015-06-15 VITALS — BP 143/52 | HR 65 | Temp 96.9°F | Resp 16 | Ht 63.5 in | Wt 124.0 lb

## 2015-06-15 DIAGNOSIS — I6522 Occlusion and stenosis of left carotid artery: Secondary | ICD-10-CM

## 2015-06-15 DIAGNOSIS — E119 Type 2 diabetes mellitus without complications: Secondary | ICD-10-CM | POA: Insufficient documentation

## 2015-06-15 DIAGNOSIS — I200 Unstable angina: Secondary | ICD-10-CM

## 2015-06-15 DIAGNOSIS — Z48812 Encounter for surgical aftercare following surgery on the circulatory system: Secondary | ICD-10-CM | POA: Diagnosis not present

## 2015-06-15 DIAGNOSIS — Z9889 Other specified postprocedural states: Secondary | ICD-10-CM | POA: Diagnosis not present

## 2015-06-15 DIAGNOSIS — I10 Essential (primary) hypertension: Secondary | ICD-10-CM | POA: Diagnosis not present

## 2015-06-15 DIAGNOSIS — E785 Hyperlipidemia, unspecified: Secondary | ICD-10-CM | POA: Diagnosis not present

## 2015-06-15 NOTE — Patient Instructions (Signed)
Stroke Prevention Some medical conditions and behaviors are associated with an increased chance of having a stroke. You Fines prevent a stroke by making healthy choices and managing medical conditions. HOW CAN I REDUCE MY RISK OF HAVING A STROKE?   Stay physically active. Get at least 30 minutes of activity on most or all days.  Do not smoke. It [place] also be helpful to avoid exposure to secondhand smoke.  Limit alcohol use. Moderate alcohol use is considered to be:  No more than 2 drinks per day for men.  No more than 1 drink per day for nonpregnant women.  Eat healthy foods. This involves:  Eating 5 or more servings of fruits and vegetables a day.  Making dietary changes that address high blood pressure (hypertension), high cholesterol, diabetes, or obesity.  Manage your cholesterol levels.  Making food choices that are high in fiber and low in saturated fat, trans fat, and cholesterol [place] control cholesterol levels.  Take any prescribed medicines to control cholesterol as directed by your health care provider.  Manage your diabetes.  Controlling your carbohydrate and sugar intake is recommended to manage diabetes.  Take any prescribed medicines to control diabetes as directed by your health care provider.  Control your hypertension.  Making food choices that are low in salt (sodium), saturated fat, trans fat, and cholesterol is recommended to manage hypertension.  Ask your health care provider if you need treatment to lower your blood pressure. Take any prescribed medicines to control hypertension as directed by your health care provider.  If you are 18-39 years of age, have your blood pressure checked every 3-5 years. If you are 40 years of age or older, have your blood pressure checked every year.  Maintain a healthy weight.  Reducing calorie intake and making food choices that are low in sodium, saturated fat, trans fat, and cholesterol are recommended to manage  weight.  Stop drug abuse.  Avoid taking birth control pills.  Talk to your health care provider about the risks of taking birth control pills if you are over 35 years old, smoke, get migraines, or have ever had a blood clot.  Get evaluated for sleep disorders (sleep apnea).  Talk to your health care provider about getting a sleep evaluation if you snore a lot or have excessive sleepiness.  Take medicines only as directed by your health care provider.  For some people, aspirin or blood thinners (anticoagulants) are helpful in reducing the risk of forming abnormal blood clots that can lead to stroke. If you have the irregular heart rhythm of atrial fibrillation, you should be on a blood thinner unless there is a good reason you cannot take them.  Understand all your medicine instructions.  Make sure that other conditions (such as anemia or atherosclerosis) are addressed. SEEK IMMEDIATE MEDICAL CARE IF:   You have sudden weakness or numbness of the face, arm, or leg, especially on one side of the body.  Your face or eyelid droops to one side.  You have sudden confusion.  You have trouble speaking (aphasia) or understanding.  You have sudden trouble seeing in one or both eyes.  You have sudden trouble walking.  You have dizziness.  You have a loss of balance or coordination.  You have a sudden, severe headache with no known cause.  You have new chest pain or an irregular heartbeat. Any of these symptoms [place] represent a serious problem that is an emergency. Do not wait to see if the symptoms will   go away. Get medical help at once. Call your local emergency services (911 in [place] Do not drive yourself to the hospital.   This information is not intended to replace advice given to you by your health care provider. Make sure you discuss any questions you have with your health care provider.   Document Released: 10/03/2004 Document Revised: 09/16/2014 Document Reviewed:  02/26/2013 Elsevier Interactive Patient Education 2016 Elsevier Inc.  

## 2015-06-15 NOTE — Addendum Note (Signed)
Addended by: Dorthula Rue L on: 06/15/2015 03:20 PM   Modules accepted: Orders

## 2015-06-15 NOTE — Progress Notes (Signed)
Filed Vitals:   06/15/15 1134 06/15/15 1138 06/15/15 1142  BP: 136/60 145/58 143/52  Pulse: 68 68 65  Temp:  96.9 F (36.1 C)   TempSrc:  Oral   Resp:  16   Height:  5' 3.5" (1.613 m)   Weight:  124 lb (56.246 kg)   SpO2:  96%

## 2015-06-15 NOTE — Progress Notes (Signed)
Established Carotid Patient   History of Present Illness  Jill Howell is a 81 y.o. female  patient of Dr. Oneida Alar who is s/p left CEA on 05/19/2013 for greater than 80% stenosis of the left ICA.  She returns today for follow up.   She had transient left arm numbness, expressive aphasia before the CEA, and a milder form of transient left arm numbness after the left CEA, these symptoms have resolved. She denies any further stroke or TIA activity since this.  She denies a history of monocular blindness.   She denies claudication symptoms with walking.   She had an MI December, 2014.   Pt Diabetic: Yes, diet controlled  Pt smoker: non-smoker   Pt meds include:  Statin : Yes  ASA: Yes  Other anticoagulants/antiplatelets: no    Past Medical History  Diagnosis Date  . Unspecified hearing loss   . Hypothyroidism   . Hyperlipidemia   . Esophageal reflux   . Type II or unspecified type diabetes mellitus without mention of complication, not stated as uncontrolled   . CAD (coronary artery disease)     a. 2000 s/p MI/CABG x 4 (VG-PDA, VG-OM, VG-Diag, LIMA-LAD);  b. 2011 MI;  c. 08/2013 NSTEMI/Cath: LM 90-95, LAD 90/100p, LCX 99p, 136m, RCA 100ost, VG-PDA nl, VG-OM 100 (culprit w/ thrombus), VG-Diag nl, LIMA-LAD nl, EF 55-60%, mild inf HK, mod MR-->Med Rx.  . Chronic diastolic CHF (congestive heart failure) ([place])     a. 08/2013 Echo: EF 50-55%, no rwma, Gr2 DD, mod MR, mildly dil LA, PASP 30mmHg.  . Atrial fibrillation ([place])     a. remote->no anticoagulation 2/2 h/o falls.  . Asthma   . Allergic rhinitis, cause unspecified   . Hypertension   . History of TIA (transient ischemic attack)   . Carotid arterial disease ([place])     a. 05/2013 s/p L CEA.  [name] Anemia   . Myocardial infarction [facility])     08-2013    Social History Social History  Substance Use Topics  . Smoking status: Never Smoker   . Smokeless tobacco: Never Used  . Alcohol Use: No    Family History Family History   Problem Relation Age of Onset  . Heart attack Father     X's 3  . Heart disease Father   . Heart disease Son     before age 76  . Cancer Brother   . Hypertension Mother   . Alzheimer's disease Mother     Surgical History Past Surgical History  Procedure Laterality Date  . Tonsillectomy    . Partial hysterectomy    . Dilation and curettage of uterus    . Bladder tack      x 2  . Rectocele repair      x 2  . Cholecystectomy  10/2001  . Rotator cuff repair    . Coronary artery bypass graft  2000    4 vessel  . Hemorrhoid surgery    . Cardiac catheterization  09/07/1999    EF 25%. THERE IS SEVERE MITRAL ANNULAR CALIFICATION, WITH  3 + INSUFFICIENCY  . US echocardiography  01/17/2005    EF 55-60%  . Cardiovascular stress test  06/30/2007    EF 63%, NO ISCHEMIA  . Eye surgery Bilateral     Cataract  . Endarterectomy Left 05/19/2013    Procedure: ENDARTERECTOMY CAROTID-LEFT;  Surgeon: Elam Dutch, MD;  Location: [facility];  Service: Vascular;  Laterality: Left;  . Carotid endarterectomy Left 05-19-13  cea  . Left heart cath  Dec. 2, 2014    With  Coronary Graft  Angiogram  . Abdominal hysterectomy    . Left heart catheterization with coronary/graft angiogram  08/10/2013    Procedure: LEFT HEART CATHETERIZATION WITH Beatrix Fetters;  Surgeon: Peter M Martinique, MD;  Location: [facility] CATH LAB;  Service: Cardiovascular;;    Allergies  Allergen Reactions  . Penicillins Other (See Comments)    unknown    Current Outpatient Prescriptions  Medication Sig Dispense Refill  . acetaminophen (TYLENOL) 325 MG tablet Take 325-650 mg by mouth every 6 (six) hours as needed (for pain).    [name] aspirin 81 MG EC tablet Take 81 mg by mouth daily.      [name] atenolol (TENORMIN) 25 MG tablet Take 0.5 tablets (12.5 mg total) by mouth daily.    [name] BIOTIN PO Take 1 capsule by mouth daily.     . Cholecalciferol (VITAMIN D3) 5000 UNITS CAPS Take 5,000 Units by mouth daily.    [name] DIGOX 125 MCG tablet  TAKE 1 TABLET BY MOUTH EVERY DAY 90 tablet 0  . digoxin (DIGOX) 0.125 MG tablet Take 1 tablet (125 mcg total) by mouth daily. 90 tablet 3  . ferrous fumarate (HEMOCYTE - 106 MG FE) 325 (106 FE) MG TABS tablet Take 28 mg of iron by mouth at bedtime and [place] repeat dose one time if needed.     . furosemide (LASIX) 20 MG tablet TAKE ONE TABLET (20MG ) BY MOUTH DAILY 90 tablet 3  . isosorbide mononitrate (IMDUR) 30 MG 24 hr tablet TAKE 1 TABLET (30 MG TOTAL) BY MOUTH DAILY. 90 tablet 1  . latanoprost (XALATAN) 0.005 % ophthalmic solution Place 1 drop into both eyes at bedtime.    [name] levothyroxine (SYNTHROID, LEVOTHROID) 100 MCG tablet TAKE 1 TABLET (100 MCG TOTAL) BY MOUTH DAILY. 90 tablet 1  . losartan (COZAAR) 25 MG tablet Take 25 mg by mouth daily.    . Multiple Vitamin (MULTIVITAMINS PO) Take 1 tablet by mouth daily.     . Multiple Vitamins-Minerals (PRESERVISION AREDS PO) Take 1 tablet by mouth daily.    . nitroGLYCERIN (NITROSTAT) 0.4 MG SL tablet PLACE 1 TABLET UNDER THE TONGUE EVERY 5 MINUTES AS NEEDED FOR CHEST PAIN. 100 tablet PRN  . pantoprazole (PROTONIX) 40 MG tablet TAKE ONE TABLET EVERY DAY 90 tablet 0  . simvastatin (ZOCOR) 40 MG tablet TAKE 1 TABLET (40 MG TOTAL) BY MOUTH EVERY EVENING. 90 tablet 1  . vitamin B-12 (CYANOCOBALAMIN) 1000 MCG tablet Take 2,000 mcg by mouth daily.      No current facility-administered medications for this visit.    Review of Systems : See HPI for pertinent positives and negatives.  Physical Examination  Filed Vitals:   06/15/15 1134 06/15/15 1138 06/15/15 1142  BP: 136/60 145/58 143/52  Pulse: 68 68 65  Temp:  96.9 F (36.1 C)   TempSrc:  Oral   Resp:  16   Height:  5' 3.5" (1.613 m)   Weight:  124 lb (56.246 kg)   SpO2:  96%    Body mass index is 21.62 kg/(m^2).  General: WDWN female in NAD  GAIT: normal  Eyes: PERRLA  Pulmonary: Non-labored, CTAB, no rales, no rhonchi, & no wheezing.  Cardiac: regular Rhythm, no detected murmur.    VASCULAR EXAM  Carotid Bruits  Left  Right    Positive  positive   Aorta is not palpable.  Radial pulses are 2+ palpable and equal.  LE Pulses  LEFT  RIGHT   POPLITEAL  not palpable  not palpable   POSTERIOR TIBIAL  not palpable  not palpable   DORSALIS PEDIS  ANTERIOR TIBIAL  palpable  palpable    Gastrointestinal: soft, nontender, BS WNL, no r/g, no palpable masses.  Musculoskeletal: Negative muscle atrophy/wasting. M/S 5/5 throughout, Extremities without ischemic changes.  Neurologic: A&O X 3; Appropriate Affect ; SENSATION ;normal except for diminished sensation left mandible area, normal sensation left CEA incision;  Speech is normal  CN 2-12 intact, Pain and light touch intact in extremities, Motor exam as listed above.         Non-Invasive Vascular Imaging CAROTID DUPLEX 06/15/2015   Right ICA: 1 - 39 % stenosis. Left ICA: 1 - 39 % stenosis. Bilateral vertebral arteries are antegrade. Greater than 50% bilateral external carotid artery stenoses. No significant change compared to the previous exam on 06/09/14.   Assessment: Jill Howell is a 81 y.o. female who is s/p left CEA on 05/19/2013 for greater than 80% stenosis of the left ICA.  She had transient left arm numbness, expressive aphasia before the CEA, and a milder form of transient left arm numbness after the left CEA, these symptoms have resolved. She denies any further stroke or TIA activity since this.  Today's carotid duplex suggests minimal bilateral internal carotid artery stenoses; no significant change compared to a year ago.   Plan: Follow-up in 1 year with Carotid Duplex.   I discussed in depth with the patient the nature of atherosclerosis, and emphasized the importance of maximal medical management including strict control of blood pressure, blood glucose, and lipid levels, obtaining regular exercise, and continued cessation of smoking.  The patient is aware that without  maximal medical management the underlying atherosclerotic disease process will progress, limiting the benefit of any interventions. The patient was given information about stroke prevention and what symptoms should prompt the patient to seek immediate medical care. Thank you for allowing [place] to participate in this patient's care.  Clemon Chambers, RN, MSN, FNP-C [facility] Office: [facility] Physician: Oneida Alar  06/15/2015 12:00 PM

## 2015-06-28 ENCOUNTER — Other Ambulatory Visit: Payer: Self-pay | Admitting: Cardiology

## 2015-06-29 ENCOUNTER — Encounter: Payer: Self-pay | Admitting: Internal Medicine

## 2015-06-29 ENCOUNTER — Other Ambulatory Visit: Payer: Medicare Other

## 2015-06-29 ENCOUNTER — Ambulatory Visit (INDEPENDENT_AMBULATORY_CARE_PROVIDER_SITE_OTHER): Payer: Medicare Other | Admitting: Internal Medicine

## 2015-06-29 VITALS — BP 126/72 | HR 67 | Ht 63.0 in | Wt 126.4 lb

## 2015-06-29 DIAGNOSIS — J449 Chronic obstructive pulmonary disease, unspecified: Secondary | ICD-10-CM | POA: Diagnosis not present

## 2015-06-29 DIAGNOSIS — I200 Unstable angina: Secondary | ICD-10-CM

## 2015-06-29 DIAGNOSIS — I480 Paroxysmal atrial fibrillation: Secondary | ICD-10-CM | POA: Diagnosis not present

## 2015-06-29 DIAGNOSIS — Z91018 Allergy to other foods: Secondary | ICD-10-CM

## 2015-06-29 DIAGNOSIS — Z23 Encounter for immunization: Secondary | ICD-10-CM | POA: Diagnosis not present

## 2015-06-29 DIAGNOSIS — J45909 Unspecified asthma, uncomplicated: Secondary | ICD-10-CM

## 2015-06-29 NOTE — Progress Notes (Signed)
Subjective:    Patient ID: Jill Howell, female    DOB: 1919/01/24, 81 y.o.   MRN: [id number]  HPI 80 yo female with known hx of AB, AR  06/23/08- Thinks if she takes Centrum Silver vitamin, or eats her own homemade applesauce she gets a burning sensation, bilateral axillary/ ribs. No GI/GU or chest pain/palpitation otherwise. Denies sneeze, rhinorhea, wheeze or cough.  Continues allergy vaccine 1:10 every 2 weeks.   11/23/08-Asthmatic bronchitis, rhinits, CAD/CHF/AF  4-5 days hard left frontal and maxillary headache, watery rhinorhea. Ears ok, no fever or purulent discharge. Chest feels normal for her.  She is convinced allergy vaccine helps her.   June 22, 2009- Asthmatic bronchitis, rhinitis, CAD. CHF  Pain behind right ear since [place]. Has had ENT and carotid artery eval. Hearing aids.  Discussed flu shot.  Hauled dirt yesterday- still active. Morning cough with phlegm.   June 22, 2010- Asthmatic bronchitis, rhinitis, CAD/ MI. CHF  Nurse:cc Yearly follow up visit-asthma; Breathing is good per patient.  Says she has been doing well recently. She had an MI in February/ Dr Mare Ferrari.  Ribs get sore when shes tired. Then she tells me she picked up 20 bushels or walnuts- showing me her stained hands. Takes an antihistamine but doesn't know what it is- says prescription.  Had mold in home- remediated. No asthma or inhaler. Sometimes morning cough.  Nurse neighbor still gives allergy vaccine every 2 weeks. Discussed Epipen. Feels a little bump in right external ear canal- worried because friend had a cancer there.  05/09/2011 Acute walk in  Pt complains of rash on back for 1 week.  Was seen at Urgent care this am for rash - Pimple like rash across back and sides and on feet - Itching - Pt reports pulling a tick off under right arm this am. Unclear is this was a tick or bug because it crawled away. Urgent care dx w/ allergic dermatitis and rx Clarirtin.[name] Lesions are very pruritics. Has been  scratching and try to squeeze them. Believes she did remove 1 tick and but friend saw very small insect vs ticks crawling. She has greater than 25 bumps on back.  Works outside a lot.   06/21/11-81 year old female never smoker followed for Asthmatic bronchitis, rhinitis, complicated by CAD/ MI/ AF CHF, DM, GERD She continues allergy vaccine given by a neighbor/nurse without problems. She gets her shots every 2-3 weeks. He is using a dehumidifier because of mold in her house. So far the fall season is comfortable for her with occasional sneeze. She remains active and talks about picking up 20 bushels of walnuts from her property. Chest x-ray 12/15/2010 showed changes of COPD with no acute process. I talked with her about stopping allergy vaccine after all this time and at her age. She feels comfortable and safe with them and is considering my suggestion.  06/26/12- 81 year old female never smoker followed for Asthmatic bronchitis, rhinitis, complicated by CAD/ MI/ AF CHF, DM, GERD Stopped vaccine; states having some cough-clear in color-mainly happens in morning time. Can't tell me for how long this has been happening. Not concerned. Occ sneeze and blowing nose.  Asks refill red cough syrup "no codeine". Says over-the-counter cough syrups were no help. We discussed cough versus wheeze. She has been off allergy vaccine for the past year.   06/28/13- 81 year old female never smoker followed for Asthmatic bronchitis, rhinitis, complicated by CAD/ MI/ AF CHF, DM, GERD FOLLOWS FOR: increased in cough-clear in color. Recently (about a month  ago) had "artery surgery". now has increased mucus in mouth. Tolerated L CEA surgery. Cough makes throat sore, esp when she eats. CXR 05/19/13 IMPRESSION:  1. Cardiomegaly without pulmonary edema.  2. Bronchitic changes, likely chronic.  Original Report Authenticated By: Nolon Nations, M.D.  03/31/14- 81 year old female never smoker followed for Asthmatic  bronchitis, rhinitis, complicated by CAD/ MI/ AF CHF/ CABG/ CEA, DM, GERD ACUTE VISIT: Increased cough-clear in color since Sept 2014. Denies any SOB or wheezing. Had left CEA in September, then an MI in December and says she began coughing clear mucus after that-mostly a morning cough. Comfortable later in the day. Her PCP gave medication for "weakness"  July 16. , which she did not take. She thinks her son might be putting blood pressure pills in salad dressing  to make her feel weak.  CXR 7/16 /15 IMPRESSION:  Enlargement of cardiac silhouette post CABG.  COPD changes without acute abnormalities.  Electronically Signed  By: Lavonia Dana M.D.  On: 03/24/2014 16:06  06/28/14- 81 year old female never smoker followed for Asthmatic bronchitis, rhinitis, complicated by CAD/ MI/ AF CHF/ CABG/ CEA, DM, GERD FOLLOWS FOR: continues to have cough all the time-clear in color. Was told by pharmacy that she is on a medication that will make her cough-pt is unsure of the name of the medication CXR 06/09/14 FINDINGS:  There is cardiomegaly. The patient is status post CABG. The lungs  are clear. The chest is hyperexpanded. No pneumothorax or pleural  effusion. Mitral anulus calcification noted. Postoperative change is  seen about both shoulders.  IMPRESSION:  No acute disease. Stable compared to prior exams.  Electronically Signed  By: Inge Rise M.D.  On: 06/09/2014 16:26  12/28/14- 81 year old female never smoker followed for Asthmatic bronchitis, rhinitis, complicated by CAD/ MI/ AF CHF/ CABG/ CEA, DM, GERD FOLLOWS FOR: Pt states she has not had any issues with SOB, wheezing, cough, or congestion.  06/29/15- 81 year old female never smoker followed for Asthmatic bronchitis, rhinitis, complicated by CAD/ MI/ AF CHF/ CABG/ CEA, DM, GERD FOLLOWS FOR: Pt states she continues to have cough; wants flu shot today as well and Prevnar if possible. Transportation is difficult. She harvests walnuts each  year and fingers are stained from. She asks if we can check for pecan allergy. Questions possible association with itching. Chronic mild cough usually nonproductive, without wheeze. She remains active for her age. CXR 04/04/15-  IMPRESSION: COPD, cardiomegaly. No active disease. Electronically Signed  By: Rolm Baptise M.D.  On: 04/04/2015 13:54  Review of Systems- see HPI Constitutional:   No-   weight loss, night sweats, fevers, chills, fatigue, lassitude. HEENT:   No-  headaches, difficulty swallowing, tooth/dental problems, sore throat,       No-sneezing, no- itching, ear ache, nasal congestion, post nasal drip,  CV:  No-   chest pain, orthopnea, PND, swelling in lower extremities, anasarca, dizziness, palpitations Resp: No-   shortness of breath with exertion or at rest.              No-  productive cough,  + non-productive cough,  No-  coughing up of blood.              No-   change in color of mucus.  No- wheezing.   Skin: No-   rash or lesions. GI:  No-   heartburn, indigestion, abdominal pain, nausea, vomiting,  GU:  MS:  No-   joint pain or swelling.  . Neuro-   Psych:  No- change in mood  or affect. No depression or anxiety.  No memory loss.  Objective:   Physical Exam General- Alert, Oriented, Affect-appropriate, Distress- none acute, slender, very alert, talkative Skin- + hands stained by walnuts  Lymphadenopathy- none Head- atraumatic            Eyes- Gross vision intact, PERRLA, conjunctivae clear secretions            Ears- Hearing, canals-normal for age            Nose- Clear, no-Septal dev, mucus, polyps, erosion, perforation             Throat- Mallampati II , mucosa clear , drainage- none, tonsils- atrophic.  Neck- flexible , trachea midline, no stridor , thyroid nl, carotid no bruit.  Chest - symmetrical excursion , unlabored           Heart/CV- RRR ,  murmur? Trace systolic , no gallop  , no rub, nl s1 s2                           - JVD- none , edema- none,  stasis changes- none, varices- none           Lung- + few crackles in bases, unlabored, wheeze- none, cough-none , dullness-none, rub- none           Chest wall-  Abd-  Br/ Gen/ Rectal- Not done, not indicated Extrem- cyanosis- none, clubbing, none, atrophy- none, strength- nl Neuro- grossly intact to observation

## 2015-06-29 NOTE — Patient Instructions (Addendum)
Flu vax  Prevnar 13 pneumonia vaccine ( you won't need to repeat this one)

## 2015-06-30 LAB — NUT MIX PROFILE
Pecan Nut: <0.1 kU/L
Pistachio  IgE: <0.1 kU/L

## 2015-07-02 NOTE — Assessment & Plan Note (Signed)
Pulse feels regular at this examination

## 2015-07-02 NOTE — Assessment & Plan Note (Signed)
Mild persistent well-controlled Plan-because of her transportation problems, I agreed to give both flu and Prevnar vaccines at this visit after discussing potential for blunted effect. She feels it [place] be too long before she has another opportunity.

## 2015-07-03 NOTE — Progress Notes (Signed)
Quick Note:  lmtcb for pt. ______ 

## 2015-07-04 ENCOUNTER — Telehealth: Payer: Self-pay | Admitting: Internal Medicine

## 2015-07-04 NOTE — Telephone Encounter (Signed)
Notes Recorded by Deneise Lever, MD on 06/30/2015 at 3:35 PM Nut allergy test- normal result. There is no abnormal allergy antibody result for any nuts, including pecans. --------------------------- Spoke with pt, aware of results.  Nothing further needed.

## 2015-07-28 ENCOUNTER — Other Ambulatory Visit: Payer: Self-pay | Admitting: Cardiology

## 2015-08-04 ENCOUNTER — Other Ambulatory Visit: Payer: Self-pay | Admitting: Cardiology

## 2015-08-31 ENCOUNTER — Other Ambulatory Visit: Payer: Self-pay | Admitting: Cardiology

## 2015-08-31 DIAGNOSIS — Z961 Presence of intraocular lens: Secondary | ICD-10-CM | POA: Diagnosis not present

## 2015-09-12 ENCOUNTER — Telehealth: Payer: Self-pay | Admitting: Cardiology

## 2015-09-12 NOTE — Telephone Encounter (Signed)
Patient tells me this is not a new issue for her.  States this has been occuring for awhile now.  Denies swelling to arms. She says Sunday it was her right arm and this morning it was her left arm.  States both mornings, after rubbing arm it would feel better. Denies any numbness/pain at this time. Informed her that Dr. Mare Ferrari, nor Rip Harbour will be in the office until tomorrow. Advised to contact PCP to discuss concern. Patient has a currently scheduled appt on Friday w/ Dr. Mare Ferrari and I told her she can discuss issue with him then if still a concern. She verbalized understanding and agreeable to plan.

## 2015-09-12 NOTE — Telephone Encounter (Signed)
New Message  Pt stated that she has been experiencing numbness in both hands since Sunday- 09/10/15. Please call back and discuss.

## 2015-09-15 ENCOUNTER — Other Ambulatory Visit: Payer: Self-pay | Admitting: Cardiology

## 2015-09-15 ENCOUNTER — Ambulatory Visit (INDEPENDENT_AMBULATORY_CARE_PROVIDER_SITE_OTHER): Payer: Medicare Other | Admitting: Cardiology

## 2015-09-15 ENCOUNTER — Encounter: Payer: Self-pay | Admitting: Cardiology

## 2015-09-15 VITALS — BP 132/48 | HR 82 | Ht 63.0 in | Wt 123.8 lb

## 2015-09-15 DIAGNOSIS — I119 Hypertensive heart disease without heart failure: Secondary | ICD-10-CM

## 2015-09-15 DIAGNOSIS — I5032 Chronic diastolic (congestive) heart failure: Secondary | ICD-10-CM | POA: Diagnosis not present

## 2015-09-15 DIAGNOSIS — I6522 Occlusion and stenosis of left carotid artery: Secondary | ICD-10-CM | POA: Diagnosis not present
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
## 2015-09-15 NOTE — Patient Instructions (Addendum)
Medication Instructions:  Your physician recommends that you continue on your current medications as directed. Please refer to the Current Medication list given to you today.  Labwork: NONE  Testing/Procedures: NONE  Follow-Up: Your physician wants you to follow-up in: [place] [address] You will receive a reminder letter in the mail two months in advance. If you don't receive a letter, please call our office to schedule the follow-up appointment.  If you need a refill on your cardiac medications before your next appointment, please call your pharmacy.

## 2015-10-03 ENCOUNTER — Telehealth: Payer: Self-pay | Admitting: Cardiology

## 2015-10-03 NOTE — Telephone Encounter (Signed)
New problem   Pt needs alternative for lasix. Please advise pharmacy.

## 2015-10-03 NOTE — Telephone Encounter (Signed)
Spoke with pharmacy regarding patients Furosemide, generic manufacturer has changed and patient does not like current one [facility] is unable to get previous manufacturer  Spoke with patient and she stated that the current manufacturer did not help when she took before (only took a few) Advised patient to try current Rx since she already has them and to call back if she has any problems Patient agreeable to plan Left message at [facility]

## 2015-10-11 DIAGNOSIS — H01021 Squamous blepharitis right upper eyelid: Secondary | ICD-10-CM | POA: Diagnosis not present

## 2015-10-19 DIAGNOSIS — H401132 Primary open-angle glaucoma, bilateral, moderate stage: Secondary | ICD-10-CM | POA: Diagnosis not present

## 2015-11-08 ENCOUNTER — Other Ambulatory Visit: Payer: Self-pay | Admitting: Cardiology

## 2015-11-30 DIAGNOSIS — H401132 Primary open-angle glaucoma, bilateral, moderate stage: Secondary | ICD-10-CM | POA: Diagnosis not present

## 2015-12-20 ENCOUNTER — Other Ambulatory Visit: Payer: Self-pay | Admitting: *Deleted

## 2015-12-20 MED ORDER — LOSARTAN POTASSIUM 25 MG PO TABS: 25.0000 mg | ORAL_TABLET | Freq: Every day | ORAL | Status: DC

## 2015-12-20 NOTE — Telephone Encounter (Signed)
Requested Prescriptions   Signed Prescriptions Disp Refills  . losartan (COZAAR) 25 MG tablet 90 tablet 0    Sig: Take 1 tablet (25 mg total) by mouth daily.    Authorizing Provider: Minna Merritts    Ordering User: Britt Bottom

## 2016-01-04 ENCOUNTER — Other Ambulatory Visit: Payer: Self-pay | Admitting: *Deleted

## 2016-01-04 MED ORDER — PANTOPRAZOLE SODIUM 40 MG PO TBEC: 40.0000 mg | DELAYED_RELEASE_TABLET | Freq: Every day | ORAL | Status: DC

## 2016-01-04 NOTE — Telephone Encounter (Signed)
Last visit 09/15/15 with Dr. Mare Ferrari Protonix 40 mg daily  Your physician recommends that you continue on your current medications as directed. Please refer to the Current Medication list given to you today.

## 2016-01-11 DIAGNOSIS — H401132 Primary open-angle glaucoma, bilateral, moderate stage: Secondary | ICD-10-CM | POA: Diagnosis not present

## 2016-01-15 ENCOUNTER — Encounter: Payer: Self-pay | Admitting: Cardiovascular Disease

## 2016-01-15 ENCOUNTER — Ambulatory Visit (INDEPENDENT_AMBULATORY_CARE_PROVIDER_SITE_OTHER): Payer: Medicare Other | Admitting: Cardiovascular Disease

## 2016-01-15 VITALS — BP 132/48 | HR 66 | Ht 63.5 in | Wt 125.0 lb

## 2016-01-15 DIAGNOSIS — I480 Paroxysmal atrial fibrillation: Secondary | ICD-10-CM | POA: Diagnosis not present

## 2016-01-15 DIAGNOSIS — I739 Peripheral vascular disease, unspecified: Secondary | ICD-10-CM

## 2016-01-15 DIAGNOSIS — I779 Disorder of arteries and arterioles, unspecified: Secondary | ICD-10-CM

## 2016-01-15 DIAGNOSIS — G459 Transient cerebral ischemic attack, unspecified: Secondary | ICD-10-CM | POA: Diagnosis not present

## 2016-01-15 DIAGNOSIS — E785 Hyperlipidemia, unspecified: Secondary | ICD-10-CM

## 2016-01-15 DIAGNOSIS — I119 Hypertensive heart disease without heart failure: Secondary | ICD-10-CM

## 2016-01-15 DIAGNOSIS — I200 Unstable angina: Secondary | ICD-10-CM | POA: Diagnosis not present

## 2016-01-15 DIAGNOSIS — I5032 Chronic diastolic (congestive) heart failure: Secondary | ICD-10-CM

## 2016-01-15 NOTE — Assessment & Plan Note (Signed)
She denies any recent TIA or stroke symptoms. If she does have symptoms, recommended that she call our office Potentially could add Plavix

## 2016-01-15 NOTE — Assessment & Plan Note (Signed)
Cholesterol is at goal on the current lipid regimen. No changes to the medications were made.    Total encounter time more than 25 minutes  Greater than 50% was spent in counseling and coordination of care with the patient  
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
## 2016-01-15 NOTE — Assessment & Plan Note (Signed)
Maintaining normal sinus rhythm. She denies any symptoms concerning for arrhythmia

## 2016-01-15 NOTE — Assessment & Plan Note (Signed)
History of carotid endarterectomy on the left This could be monitored through our office if needed, if not followed by vascular surgery

## 2016-01-15 NOTE — Assessment & Plan Note (Signed)
Currently with no symptoms of angina. No further workup at this time. Continue current medication regimen. 

## 2016-01-15 NOTE — Assessment & Plan Note (Signed)
She appears relatively euvolemic on today's visit. Recommended that she stay on her Lasix No medication changes made at this time.

## 2016-01-15 NOTE — Patient Instructions (Addendum)
You are doing well. No medication changes were made.  Please call us if you have new issues that need to be addressed before your next appt.  Your physician wants you to follow-up in: 4 months.    Date & Time: ______________________________________________________________

## 2016-01-29 ENCOUNTER — Telehealth: Payer: Self-pay | Admitting: Family Medicine

## 2016-01-29 ENCOUNTER — Ambulatory Visit: Payer: Self-pay | Admitting: Family Medicine

## 2016-01-29 ENCOUNTER — Telehealth: Payer: Self-pay | Admitting: Cardiovascular Disease

## 2016-01-29 ENCOUNTER — Ambulatory Visit (INDEPENDENT_AMBULATORY_CARE_PROVIDER_SITE_OTHER): Payer: Medicare Other | Admitting: Family Medicine

## 2016-01-29 ENCOUNTER — Encounter: Payer: Self-pay | Admitting: Family Medicine

## 2016-01-29 VITALS — BP 142/66 | HR 69 | Temp 97.5°F | Wt 126.0 lb

## 2016-01-29 DIAGNOSIS — I200 Unstable angina: Secondary | ICD-10-CM

## 2016-01-29 DIAGNOSIS — Y92009 Unspecified place in unspecified non-institutional (private) residence as the place of occurrence of the external cause: Principal | ICD-10-CM

## 2016-01-29 DIAGNOSIS — W19XXXA Unspecified fall, initial encounter: Secondary | ICD-10-CM | POA: Diagnosis not present

## 2016-01-29 DIAGNOSIS — Y92099 Unspecified place in other non-institutional residence as the place of occurrence of the external cause: Secondary | ICD-10-CM | POA: Diagnosis not present

## 2016-01-29 NOTE — Telephone Encounter (Signed)
Patient Name: Jill Howell  DOB: 05-04-1919    Initial Comment Caller states she has recently been falling suddenly without slipping and with no warning    Nurse Assessment  Nurse: Leilani Merl, RN, Heather Date/Time (Eastern Time): 01/29/2016 9:29:47 AM  Confirm and document reason for call. If symptomatic, describe symptoms. You must click the next button to save text entered. ---Caller states she has recently been falling suddenly without slipping and with no warning. She fell yesterday and had fallen numerous times lately.  Has the patient traveled out of the country within the last 30 days? ---Not Applicable  Does the patient have any new or worsening symptoms? ---Yes  Will a triage be completed? ---Yes  Related visit to physician within the last 2 weeks? ---No  Does the PT have any chronic conditions? (i.e. diabetes, asthma, etc.) ---Yes  List chronic conditions. ---See MR  Is this a behavioral health or substance abuse call? ---No     Guidelines    Guideline Title Affirmed Question Affirmed Notes  Weakness (Generalized) and Fatigue [1] MODERATE weakness (i.e., interferes with work, school, normal activities) AND [2] cause unknown (Exceptions: weakness with acute minor illness, or weakness from poor fluid intake)    Final Disposition User   See Physician within 4 Hours (or PCP triage) Facilities manager, RN, Conservator, museum/gallery states that she does not want to go to the ED or [facility], she wants to be worked in at the office.   Referrals  REFERRED TO PCP OFFICE   Disagree/Comply: Comply

## 2016-01-29 NOTE — Telephone Encounter (Signed)
I spoke with pt and scheduled appt today at 1:45 but pt cannot get to appt in time. Pt does not want to go to ED or UC but does want to be seen today.pt last fell 01/28/16;pt said in the past year has fallen 25 times. Pt said felt like back of legs giving away;  Last night BP 198/79 P70. No h/a,dizziness,SOB or CP. Lt side and tail bone are sore since fell 01/28/16. Pt request cb. [facility]. If pt condition worsens before seen pt will go to ED.

## 2016-01-29 NOTE — Telephone Encounter (Signed)
Can she come in today? Okay to add to schedule.  O/w needs UC eval.  Thanks.

## 2016-01-29 NOTE — Progress Notes (Signed)
Pre visit review using our clinic review tool, if applicable. No additional management support is needed unless otherwise documented below in the visit note.  Mult falls in the last year.  Fell yesterday at home.  No LOC.  Not lightheaded on standing.  Her "legs got weak" and she "went down" with the event.  No CP.  She has a fall alert button she can keep in her pocket.  She doesn't have vertigo.   BP has been high at home but much lower at cardiology clinic and here today.  We didn't know if her BP cuff at home is accurate.  D/w pt.    Meds, vitals, and allergies reviewed.   ROS: Per HPI unless specifically indicated in ROS section   GEN: nad, alert and oriented, NCAT HEENT: mucous membranes moist NECK: supple w/o LA CV: rrr.  PULM: ctab, no inc wob ABD: soft, +bs EXT: no edema SKIN: no acute rash but bruising noted on the L buttock.  Able to bear full weight on each leg separately.  No dec in ROM at the hips.   Gait steady, symmetric

## 2016-01-29 NOTE — Telephone Encounter (Signed)
Patient called and keeps falling and is worried its her bp and that she needs to change her medication (doesn't know the name of the medication).  01/28/16  5pm  198/79 01/29/16 7am   161/72

## 2016-01-29 NOTE — Telephone Encounter (Signed)
Spoke w/ pt.  She reports that she fell yesterday evening around 5 pm, hit her head and bruised her hip. Reports that she has fallen >25 times in the last year, falls w/ no warning; feels like the back of her legs "just give out". She denies LOC before or after fall. Advised that she needs to be evaluated.   She reports that she feels fine, has been very active today. She does not want to go to ED, but will have her son take her to Urgent Care since he is visiting from [place]. She will have him take her there now.  Asked her to call back if we can be of further assistance.

## 2016-01-29 NOTE — Patient Instructions (Addendum)
Get your fall alert button and keep in your pocket.   Check your BP cuff at the house and update me as needed.   Don't change your meds for now.  Jill Howell will call about your referral. Use an ice bag on the bruise on your leg.  Take care.  Glad to see you.

## 2016-01-29 NOTE — Telephone Encounter (Signed)
Appointment scheduled.

## 2016-01-30 NOTE — Assessment & Plan Note (Signed)
She has a fall alert button she can keep in her pocket, since she doesn't want to wear it.  Refer for [facility] PT for gait training.   Unclear about her BP at home.  They'll calibrate her cuff.  I don't want to induce hypotension by adding more meds or cause sig elevation by stopping a med.  We need more info and she'll check on her cuff.  At this point, still okay for outpatient f/u.

## 2016-01-31 DIAGNOSIS — Z9181 History of falling: Secondary | ICD-10-CM | POA: Diagnosis not present

## 2016-01-31 DIAGNOSIS — I25110 Atherosclerotic heart disease of native coronary artery with unstable angina pectoris: Secondary | ICD-10-CM | POA: Diagnosis not present

## 2016-01-31 DIAGNOSIS — I110 Hypertensive heart disease with heart failure: Secondary | ICD-10-CM | POA: Diagnosis not present

## 2016-01-31 DIAGNOSIS — I480 Paroxysmal atrial fibrillation: Secondary | ICD-10-CM | POA: Diagnosis not present

## 2016-01-31 DIAGNOSIS — I509 Heart failure, unspecified: Secondary | ICD-10-CM | POA: Diagnosis not present

## 2016-01-31 DIAGNOSIS — E119 Type 2 diabetes mellitus without complications: Secondary | ICD-10-CM | POA: Diagnosis not present

## 2016-02-06 ENCOUNTER — Other Ambulatory Visit: Payer: Self-pay | Admitting: *Deleted

## 2016-02-07 ENCOUNTER — Other Ambulatory Visit: Payer: Self-pay

## 2016-02-07 NOTE — Telephone Encounter (Signed)
Review for refill, Thank you. 

## 2016-02-14 ENCOUNTER — Telehealth: Payer: Self-pay | Admitting: Cardiovascular Disease

## 2016-02-14 ENCOUNTER — Other Ambulatory Visit: Payer: Self-pay

## 2016-02-14 MED ORDER — PANTOPRAZOLE SODIUM 40 MG PO TBEC: 40.0000 mg | DELAYED_RELEASE_TABLET | Freq: Every day | ORAL | Status: DC

## 2016-02-14 MED ORDER — ATENOLOL 25 MG PO TABS: 12.5000 mg | ORAL_TABLET | Freq: Every day | ORAL | Status: DC

## 2016-02-14 NOTE — Telephone Encounter (Signed)
°*  STAT* If patient is at the pharmacy, call can be transferred to refill team.   1. Which medications need to be refilled? (please list name of each medication and dose if known) Levothyroxine   2. Which pharmacy/location (including street and city if local pharmacy) is medication to be sent to? [facility] in [place]   3. Do they need a 30 day or 90 day supply? 90 day

## 2016-02-14 NOTE — Telephone Encounter (Signed)
Stated to patient we can not fill this, this medication is to go to PCP.  She is going to call them. Gave her Dr Josefine Class office number.

## 2016-02-14 NOTE — Telephone Encounter (Signed)
Refill sent for Pantoprazole and Atenolol

## 2016-02-14 NOTE — Telephone Encounter (Signed)
[facility] called for refill of levothyroxine; last TSH was 0.31 on 06/09/15 by Dr Mare Ferrari. [facility] said Dr Mare Ferrari has retired and will Dr Damita Dunnings begin to refill levothyroxine.Please advise. Pt has had no recent annual exam but acute visit on 01/29/16.

## 2016-02-15 MED ORDER — LEVOTHYROXINE SODIUM 100 MCG PO TABS: ORAL_TABLET | ORAL | Status: DC

## 2016-02-15 NOTE — Telephone Encounter (Signed)
Yes, okay, sent.  Thanks.  

## 2016-02-16 ENCOUNTER — Other Ambulatory Visit: Payer: Self-pay | Admitting: *Deleted

## 2016-02-17 ENCOUNTER — Other Ambulatory Visit: Payer: Self-pay | Admitting: Cardiovascular Disease

## 2016-02-19 ENCOUNTER — Inpatient Hospital Stay (HOSPITAL_COMMUNITY)
Admission: EM | Admit: 2016-02-19 | Discharge: 2016-02-21 | DRG: 640 | Disposition: A | Discharge status: Home Health | Payer: Medicare Other | Attending: Internal Medicine | Admitting: Internal Medicine

## 2016-02-19 ENCOUNTER — Emergency Department (HOSPITAL_COMMUNITY): Payer: Medicare Other

## 2016-02-19 ENCOUNTER — Encounter (HOSPITAL_COMMUNITY): Payer: Self-pay

## 2016-02-19 DIAGNOSIS — J45909 Unspecified asthma, uncomplicated: Secondary | ICD-10-CM | POA: Diagnosis present

## 2016-02-19 DIAGNOSIS — K219 Gastro-esophageal reflux disease without esophagitis: Secondary | ICD-10-CM | POA: Diagnosis present

## 2016-02-19 DIAGNOSIS — Z7982 Long term (current) use of aspirin: Secondary | ICD-10-CM

## 2016-02-19 DIAGNOSIS — E119 Type 2 diabetes mellitus without complications: Secondary | ICD-10-CM | POA: Diagnosis present

## 2016-02-19 DIAGNOSIS — I480 Paroxysmal atrial fibrillation: Secondary | ICD-10-CM | POA: Diagnosis present

## 2016-02-19 DIAGNOSIS — R4182 Altered mental status, unspecified: Secondary | ICD-10-CM | POA: Diagnosis not present

## 2016-02-19 DIAGNOSIS — Z8249 Family history of ischemic heart disease and other diseases of the circulatory system: Secondary | ICD-10-CM

## 2016-02-19 DIAGNOSIS — E871 Hypo-osmolality and hyponatremia: Secondary | ICD-10-CM | POA: Diagnosis not present

## 2016-02-19 DIAGNOSIS — E039 Hypothyroidism, unspecified: Secondary | ICD-10-CM | POA: Diagnosis present

## 2016-02-19 DIAGNOSIS — I5032 Chronic diastolic (congestive) heart failure: Secondary | ICD-10-CM | POA: Diagnosis present

## 2016-02-19 DIAGNOSIS — I252 Old myocardial infarction: Secondary | ICD-10-CM | POA: Diagnosis not present

## 2016-02-19 DIAGNOSIS — F29 Unspecified psychosis not due to a substance or known physiological condition: Secondary | ICD-10-CM | POA: Insufficient documentation

## 2016-02-19 DIAGNOSIS — F0390 Unspecified dementia without behavioral disturbance: Secondary | ICD-10-CM | POA: Diagnosis present

## 2016-02-19 DIAGNOSIS — G9340 Encephalopathy, unspecified: Secondary | ICD-10-CM | POA: Diagnosis not present

## 2016-02-19 DIAGNOSIS — Z9181 History of falling: Secondary | ICD-10-CM | POA: Diagnosis not present

## 2016-02-19 DIAGNOSIS — I509 Heart failure, unspecified: Secondary | ICD-10-CM | POA: Diagnosis not present

## 2016-02-19 DIAGNOSIS — R7989 Other specified abnormal findings of blood chemistry: Secondary | ICD-10-CM

## 2016-02-19 DIAGNOSIS — R748 Abnormal levels of other serum enzymes: Secondary | ICD-10-CM | POA: Diagnosis present

## 2016-02-19 DIAGNOSIS — H9190 Unspecified hearing loss, unspecified ear: Secondary | ICD-10-CM | POA: Diagnosis present

## 2016-02-19 DIAGNOSIS — F22 Delusional disorders: Secondary | ICD-10-CM | POA: Diagnosis present

## 2016-02-19 DIAGNOSIS — E860 Dehydration: Secondary | ICD-10-CM | POA: Diagnosis not present

## 2016-02-19 DIAGNOSIS — I110 Hypertensive heart disease with heart failure: Secondary | ICD-10-CM | POA: Diagnosis present

## 2016-02-19 DIAGNOSIS — L8990 Pressure ulcer of unspecified site, unspecified stage: Secondary | ICD-10-CM | POA: Diagnosis present

## 2016-02-19 DIAGNOSIS — I25110 Atherosclerotic heart disease of native coronary artery with unstable angina pectoris: Secondary | ICD-10-CM | POA: Diagnosis not present

## 2016-02-19 DIAGNOSIS — R410 Disorientation, unspecified: Secondary | ICD-10-CM | POA: Diagnosis not present

## 2016-02-19 DIAGNOSIS — E785 Hyperlipidemia, unspecified: Secondary | ICD-10-CM | POA: Diagnosis present

## 2016-02-19 DIAGNOSIS — F39 Unspecified mood [affective] disorder: Secondary | ICD-10-CM | POA: Insufficient documentation

## 2016-02-19 DIAGNOSIS — Z809 Family history of malignant neoplasm, unspecified: Secondary | ICD-10-CM | POA: Diagnosis not present

## 2016-02-19 DIAGNOSIS — Z951 Presence of aortocoronary bypass graft: Secondary | ICD-10-CM

## 2016-02-19 DIAGNOSIS — I2510 Atherosclerotic heart disease of native coronary artery without angina pectoris: Secondary | ICD-10-CM | POA: Diagnosis present

## 2016-02-19 DIAGNOSIS — Z8673 Personal history of transient ischemic attack (TIA), and cerebral infarction without residual deficits: Secondary | ICD-10-CM | POA: Diagnosis not present

## 2016-02-19 DIAGNOSIS — Z820 Family history of epilepsy and other diseases of the nervous system: Secondary | ICD-10-CM | POA: Diagnosis not present

## 2016-02-19 DIAGNOSIS — I25118 Atherosclerotic heart disease of native coronary artery with other forms of angina pectoris: Secondary | ICD-10-CM | POA: Diagnosis present

## 2016-02-19 DIAGNOSIS — I4891 Unspecified atrial fibrillation: Secondary | ICD-10-CM

## 2016-02-19 LAB — CBC WITH DIFFERENTIAL/PLATELET
BASOS ABS: 0 10*3/uL (ref 0.0–0.1)
Basophils Relative: 0 %
EOS PCT: 1 %
Eosinophils Absolute: 0.1 10*3/uL (ref 0.0–0.7)
HCT: 38.4 % (ref 36.0–46.0)
HEMOGLOBIN: 13 g/dL (ref 12.0–15.0)
LYMPHS ABS: 3.4 10*3/uL (ref 0.7–4.0)
LYMPHS PCT: 31 %
MCH: 28.9 pg (ref 26.0–34.0)
MCHC: 33.9 g/dL (ref 30.0–36.0)
MCV: 85.3 fL (ref 78.0–100.0)
Monocytes Absolute: 1.6 10*3/uL — ABNORMAL HIGH (ref 0.1–1.0)
Monocytes Relative: 15 %
NEUTROS PCT: 53 %
Neutro Abs: 5.9 10*3/uL (ref 1.7–7.7)
PLATELETS: 189 10*3/uL (ref 150–400)
RBC: 4.5 MIL/uL (ref 3.87–5.11)
RDW: 13.2 % (ref 11.5–15.5)
WBC: 10.9 10*3/uL — AB (ref 4.0–10.5)

## 2016-02-19 LAB — COMPREHENSIVE METABOLIC PANEL
ALT: 23 U/L (ref 14–54)
ANION GAP: 12 (ref 5–15)
AST: 28 U/L (ref 15–41)
Albumin: 4.4 g/dL (ref 3.5–5.0)
Alkaline Phosphatase: 76 U/L (ref 38–126)
BUN: 19 mg/dL (ref 6–20)
CHLORIDE: 90 mmol/L — AB (ref 101–111)
CO2: 24 mmol/L (ref 22–32)
Calcium: 9.7 mg/dL (ref 8.9–10.3)
Creatinine, Ser: 0.97 mg/dL (ref 0.44–1.00)
GFR, EST AFRICAN AMERICAN: 56 mL/min — AB (ref >60)
GFR, EST NON AFRICAN AMERICAN: 48 mL/min — AB (ref >60)
Glucose, Bld: 111 mg/dL — ABNORMAL HIGH (ref 65–99)
POTASSIUM: 3.8 mmol/L (ref 3.5–5.1)
SODIUM: 126 mmol/L — AB (ref 135–145)
Total Bilirubin: 1.2 mg/dL (ref 0.3–1.2)
Total Protein: 7.5 g/dL (ref 6.5–8.1)

## 2016-02-19 LAB — URINALYSIS, ROUTINE W REFLEX MICROSCOPIC
Bilirubin Urine: NEGATIVE
Glucose, UA: NEGATIVE mg/dL
Hgb urine dipstick: NEGATIVE
Ketones, ur: NEGATIVE mg/dL
LEUKOCYTES UA: NEGATIVE
NITRITE: NEGATIVE
PROTEIN: NEGATIVE mg/dL
SPECIFIC GRAVITY, URINE: 1.009 (ref 1.005–1.030)
pH: 6.5 (ref 5.0–8.0)

## 2016-02-19 LAB — TROPONIN I: TROPONIN I: 0.05 ng/mL — AB (ref <0.031)

## 2016-02-19 LAB — DIGOXIN LEVEL: Digoxin Level: 0.3 ng/mL — ABNORMAL LOW (ref 0.8–2.0)

## 2016-02-19 LAB — CBG MONITORING, ED: GLUCOSE-CAPILLARY: 115 mg/dL — AB (ref 65–99)

## 2016-02-19 MED ORDER — SODIUM CHLORIDE 0.9 % IV BOLUS (SEPSIS)
500.0000 mL | Freq: Once | INTRAVENOUS | Status: AC
  Administered 2016-02-19: 500 mL via INTRAVENOUS

## 2016-02-19 NOTE — H&P (Signed)
Jill Howell [id number] DOB: 1919-05-20 DOA: 02/19/2016     PCP: Elsie Stain, MD   Outpatient Specialists: cardiology [place] Patient coming from:  home Lives alone,       Chief Complaint: not eating  HPI: Jill Howell is a 82 y.o. female with medical history significant of diastolic CHF, dementia, CAD sp CABG, frequent falls, uncontrolled diabetes, labile HTN    Presented with confusion for the past 24 hours. She has not been eating. She was worried that some one was poisoning her and stop eating. She still drives, but has occasional moments of confusion.  NO chest pain, denies any fever. Patient gets episodes of confusion and paranoid behavior and she is worried that people are trying to hurt her. She reports she's been drinking larger cups of water Family states that this morning she finally ate some eggs and since then has been feeling better but this still brought her in to emergency to have her further evaluated  Regarding pertinent Chronic problems: CAD sp CABG in [id number], diastolic CHF per echo [id number]. Left cardiac cath 2014 showed that the saphenous vein graft to the obtuse marginal was occluded and the body of the graft with a large thrombus burden she was found to have a patent LIMA to the LAD, patent saphenous vein graft to the diagonal, and patent saphenous vein graft to the posterior descending. She was not a candidate for PCI and medical therapy was recommended. Remote A.fib not a candidate for anticoagulation due to falls.  States she [place] have history of diabetes but she is unsure  IN ER: afebrile. Hr 84 BP 165/61 WBC 10.9 Na 126 trop 0.05 Chest showing stable mild cardiomegaly no pulmonary edema CT head no evidence of acute intracranial abnormality   Hospitalist was called for admission for hyponatremia.   Review of Systems:    Pertinent positives include:  Confusion, fatigue, paranoid behavior  Constitutional:  No weight loss, night sweats, Fevers, chills,  weight loss  HEENT:  No headaches, Difficulty swallowing,Tooth/dental problems,Sore throat,  No sneezing, itching, ear ache, nasal congestion, post nasal drip,  Cardio-vascular:  No chest pain, Orthopnea, PND, anasarca, dizziness, palpitations.no Bilateral lower extremity swelling  GI:  No heartburn, indigestion, abdominal pain, nausea, vomiting, diarrhea, change in bowel habits, loss of appetite, melena, blood in stool, hematemesis Resp:  no shortness of breath at rest. No dyspnea on exertion, No excess mucus, no productive cough, No non-productive cough, No coughing up of blood.No change in color of mucus.No wheezing. Skin:  no rash or lesions. No jaundice GU:  no dysuria, change in color of urine, no urgency or frequency. No straining to urinate.  No flank pain.  Musculoskeletal:  No joint pain or no joint swelling. No decreased range of motion. No back pain.  Psych:  No change in mood or affect. No depression or anxiety. No memory loss.  Neuro: no localizing neurological complaints, no tingling, no weakness, no double vision, no gait abnormality, no slurred speech,   As per HPI otherwise 10 point review of systems negative.   Past Medical History: Past Medical History  Diagnosis Date  . Unspecified hearing loss   . Hypothyroidism   . Hyperlipidemia   . Esophageal reflux   . Type II or unspecified type diabetes mellitus without mention of complication, not stated as uncontrolled   . CAD (coronary artery disease)     a. 2000 s/p MI/CABG x 4 (VG-PDA, VG-OM, VG-Diag, LIMA-LAD);  b. 2011 MI;  c. 08/2013  NSTEMI/Cath: LM 90-95, LAD 90/100p, LCX 99p, 122m, RCA 100ost, VG-PDA nl, VG-OM 100 (culprit w/ thrombus), VG-Diag nl, LIMA-LAD nl, EF 55-60%, mild inf HK, mod MR-->Med Rx.  . Chronic diastolic CHF (congestive heart failure) ([place])     a. 08/2013 Echo: EF 50-55%, no rwma, Gr2 DD, mod MR, mildly dil LA, PASP 65mmHg.  . Atrial fibrillation ([place])     a. remote->no anticoagulation 2/2 h/o  falls.  . Asthma   . Allergic rhinitis, cause unspecified   . Hypertension   . History of TIA (transient ischemic attack)   . Carotid arterial disease ([place])     a. 05/2013 s/p L CEA.  [name] Anemia   . Myocardial infarction [facility])     08-2013   Past Surgical History  Procedure Laterality Date  . Tonsillectomy    . Partial hysterectomy    . Dilation and curettage of uterus    . Bladder tack      x 2  . Rectocele repair      x 2  . Cholecystectomy  10/2001  . Rotator cuff repair    . Coronary artery bypass graft  2000    4 vessel  . Hemorrhoid surgery    . Cardiac catheterization  09/07/1999    EF 25%. THERE IS SEVERE MITRAL ANNULAR CALIFICATION, WITH  3 + INSUFFICIENCY  . US echocardiography  01/17/2005    EF 55-60%  . Cardiovascular stress test  06/30/2007    EF 63%, NO ISCHEMIA  . Eye surgery Bilateral     Cataract  . Endarterectomy Left 05/19/2013    Procedure: ENDARTERECTOMY CAROTID-LEFT;  Surgeon: Elam Dutch, MD;  Location: [facility] OR;  Service: Vascular;  Laterality: Left;  . Carotid endarterectomy Left 05-19-13    cea  . Left heart cath  Dec. 2, 2014    With  Coronary Graft  Angiogram  . Abdominal hysterectomy    . Left heart catheterization with coronary/graft angiogram  08/10/2013    Procedure: LEFT HEART CATHETERIZATION WITH Beatrix Fetters;  Surgeon: Peter M Martinique, MD;  Location: [facility] CATH LAB;  Service: Cardiovascular;;     Social History:  Ambulatory  walker      reports that she has never smoked. She has never used smokeless tobacco. She reports that she does not drink alcohol or use illicit drugs.  Allergies:   Allergies  Allergen Reactions  . Furosemide Other (See Comments)    Patient prefers not to take med, weakness and frequent urination  . Penicillins Other (See Comments)    unknown       Family History:    Family History  Problem Relation Age of Onset  . Heart attack Father     X's 3  . Heart disease Father   . Heart disease Son      before age 66  . Cancer Brother   . Hypertension Mother   . Alzheimer's disease Mother     Medications: Prior to Admission medications   Medication Sig Start Date End Date Taking? Authorizing Provider  aspirin 81 MG EC tablet Take 81 mg by mouth daily.     Yes Historical Provider, MD  atenolol (TENORMIN) 25 MG tablet Take 0.5 tablets (12.5 mg total) by mouth daily. 02/14/16  Yes Minna Merritts, MD  BIOTIN PO Take 1 capsule by mouth daily.    Yes Historical Provider, MD  Cholecalciferol (VITAMIN D3) 5000 UNITS CAPS Take 5,000 Units by mouth daily.   Yes Historical Provider, MD  digoxin (  DIGOX) 0.125 MG tablet Take 1 tablet (125 mcg total) by mouth daily. 09/05/14  Yes Darlin Coco, MD  isosorbide mononitrate (IMDUR) 30 MG 24 hr tablet TAKE 1 TABLET (30 MG TOTAL) BY MOUTH DAILY. 02/19/16  Yes Minna Merritts, MD  levothyroxine (SYNTHROID, LEVOTHROID) 100 MCG tablet TAKE 1 TABLET (100 MCG TOTAL) BY MOUTH DAILY. 02/15/16  Yes Tonia Ghent, MD  losartan (COZAAR) 25 MG tablet Take 1 tablet (25 mg total) by mouth daily. 12/20/15  Yes Minna Merritts, MD  Multiple Vitamins-Minerals (PRESERVISION AREDS PO) Take 1 tablet by mouth daily.   Yes Historical Provider, MD  nitroGLYCERIN (NITROSTAT) 0.4 MG SL tablet PLACE 1 TABLET UNDER THE TONGUE EVERY 5 MINUTES AS NEEDED FOR CHEST PAIN. 03/01/15  Yes Darlin Coco, MD  pantoprazole (PROTONIX) 40 MG tablet Take 1 tablet (40 mg total) by mouth daily. 02/14/16  Yes Minna Merritts, MD  simvastatin (ZOCOR) 40 MG tablet TAKE 1 TABLET (40 MG TOTAL) BY MOUTH EVERY EVENING. 05/01/15  Yes Darlin Coco, MD  vitamin B-12 (CYANOCOBALAMIN) 1000 MCG tablet Take 1,000 mcg by mouth daily.    Yes Historical Provider, MD  furosemide (LASIX) 20 MG tablet TAKE ONE TABLET (20MG ) BY MOUTH DAILY Patient not taking: Reported on 02/19/2016 03/30/15   Darlin Coco, MD    Physical Exam: Patient Vitals for the past 24 hrs:  BP Temp Temp src Pulse Resp SpO2 Height  Weight  02/19/16 1845 165/61 mmHg - - 84 22 98 % - -  02/19/16 1800 (!) 148/47 mmHg - - 82 15 97 % - -  02/19/16 1730 (!) 154/53 mmHg - - 83 16 100 % - -  02/19/16 1653 (!) 140/113 mmHg - - 96 18 99 % - -  02/19/16 1545 157/88 mmHg - - 77 13 98 % - -  02/19/16 1213 181/73 mmHg 98.3 F (36.8 C) Oral 85 - 98 % 5\' 3"  (1.6 m) 56.246 kg (124 lb)    1. General:  in No Acute distress 2. Psychological: Alert and  Oriented to self and situation 3. Head/ENT:     Dry Mucous Membranes                          Head Non traumatic, neck supple                            Poor Dentition 4. SKIN:   decreased Skin turgor,  Skin clean Dry and intact no rash 5. Heart: Regular rate and rhythm no  Murmur, Rub or gallop 6. Lungs:  Clear to auscultation bilaterally, no wheezes or crackles   7. Abdomen: Soft, non-tender, Non distended 8. Lower extremities: no clubbing, cyanosis, or edema 9. Neurologically Grossly intact, moving all 4 extremities equally 10. MSK: Normal range of motion   body mass index is 21.97 kg/(m^2).  Labs on Admission:   Labs on Admission: I have personally reviewed following labs and imaging studies  CBC:  Recent Labs Lab 02/19/16 1544  WBC 10.9*  NEUTROABS 5.9  HGB 13.0  HCT 38.4  MCV 85.3  PLT 99991111   Basic Metabolic Panel:  Recent Labs Lab 02/19/16 1225  NA 126*  K 3.8  CL 90*  CO2 24  GLUCOSE 111*  BUN 19  CREATININE 0.97  CALCIUM 9.7   GFR: Estimated Creatinine Clearance: 28.7 mL/min (by C-G formula based on Cr of 0.97). Liver Function Tests:  Recent Labs Lab 02/19/16 1225  AST 28  ALT 23  ALKPHOS 76  BILITOT 1.2  PROT 7.5  ALBUMIN 4.4   No results for input(s): LIPASE, AMYLASE in the last 168 hours. No results for input(s): AMMONIA in the last 168 hours. Coagulation Profile: No results for input(s): INR, PROTIME in the last 168 hours. Cardiac Enzymes:  Recent Labs Lab 02/19/16 1544  TROPONINI 0.05*   BNP (last 3 results) No results  for input(s): PROBNP in the last 8760 hours. HbA1C: No results for input(s): HGBA1C in the last 72 hours. CBG:  Recent Labs Lab 02/19/16 1223  GLUCAP 115*   Lipid Profile: No results for input(s): CHOL, HDL, LDLCALC, TRIG, CHOLHDL, LDLDIRECT in the last 72 hours. Thyroid Function Tests: No results for input(s): TSH, T4TOTAL, FREET4, T3FREE, THYROIDAB in the last 72 hours. Anemia Panel: No results for input(s): VITAMINB12, FOLATE, FERRITIN, TIBC, IRON, RETICCTPCT in the last 72 hours. Urine analysis:    Component Value Date/Time   COLORURINE YELLOW 02/19/2016 [place] 02/19/2016 1707   LABSPEC 1.009 02/19/2016 1707   PHURINE 6.5 02/19/2016 1707   GLUCOSEU NEGATIVE 02/19/2016 1707   HGBUR NEGATIVE 02/19/2016 1707   BILIRUBINUR NEGATIVE 02/19/2016 1707   BILIRUBINUR Negative 03/21/2011 1235   KETONESUR NEGATIVE 02/19/2016 1707   PROTEINUR NEGATIVE 02/19/2016 1707   PROTEINUR Trace 03/21/2011 1235   UROBILINOGEN 0.2 04/26/2015 1350   UROBILINOGEN 0.2 03/21/2011 1235   NITRITE NEGATIVE 02/19/2016 1707   NITRITE Negative 03/21/2011 1235   LEUKOCYTESUR NEGATIVE 02/19/2016 1707   Sepsis Labs: @LABRCNTIP (procalcitonin:4,lacticidven:4) )No results found for this or any previous visit (from the past 240 hour(s)).       UA    no evidence of UTI  Lab Results  Component Value Date   HGBA1C 5.8 08/16/2014    Estimated Creatinine Clearance: 28.7 mL/min (by C-G formula based on Cr of 0.97).  BNP (last 3 results) No results for input(s): PROBNP in the last 8760 hours.   ECG REPORT  Independently reviewed Rate:85  Rhythm: Normal sinus rhythm ST&T Change: No acute ischemic changes   QTC 461  Filed Weights   02/19/16 1213  Weight: 56.246 kg (124 lb)     Cultures:    Component Value Date/Time   SDES URINE, CLEAN CATCH 12/15/2010 1740   SPECREQUEST NONE 12/15/2010 1740   CULT  12/15/2010 1740    Multiple bacterial morphotypes present, none  predominant. Suggest appropriate recollection if clinically indicated.   REPTSTATUS 12/16/2010 FINAL 12/15/2010 1740     Radiological Exams on Admission: Dg Chest 2 View  02/19/2016  CLINICAL DATA:  Confusion.  Weakness. EXAM: CHEST  2 VIEW COMPARISON:  04/04/2015 chest radiograph. FINDINGS: Sternotomy wires appear aligned and intact. CABG clips overlie the mediastinum. Soft tissue anchors overlie the bilateral humeral heads. Stable cardiomediastinal silhouette with mild cardiomegaly. No pneumothorax. Slight blunting of the costophrenic angles posteriorly could indicate trace bilateral pleural effusions. No overt pulmonary edema. No acute consolidative airspace disease. Lungs appear hyperinflated. IMPRESSION: 1. Stable mild cardiomegaly without overt pulmonary edema. 2. Slight blunting of the posterior costophrenic angles bilaterally, which suggests trace bilateral pleural effusions. 3. No acute pulmonary disease. Lungs appear hyperinflated, unchanged, suggesting obstructive lung disease. Electronically Signed   By: Ilona Sorrel M.D.   On: 02/19/2016 16:29   Ct Head Wo Contrast  02/19/2016  CLINICAL DATA:  Weakness. Bilateral lower extremity pain. Confusion. EXAM: CT HEAD WITHOUT CONTRAST TECHNIQUE: Contiguous axial images were obtained from the base of the  skull through the vertex without intravenous contrast. COMPARISON:  01/27/2009 head CT. FINDINGS: No evidence of parenchymal hemorrhage or extra-axial fluid collection. No mass lesion, mass effect, or midline shift. No CT evidence of acute infarction. Intracranial atherosclerosis. Nonspecific mild to moderate subcortical and periventricular white matter hypodensity, most in keeping with chronic small vessel ischemic change. Cerebral volume is age appropriate. No ventriculomegaly. Minimal opacification of the ethmoidal air cells. No fluid levels in the visualized paranasal sinuses. The mastoid air cells are unopacified. No evidence of calvarial fracture.  IMPRESSION: 1.  No evidence of acute intracranial abnormality. 2. Mild-to-moderate chronic small vessel ischemia. Electronically Signed   By: Ilona Sorrel M.D.   On: 02/19/2016 16:15    Chart has been reviewed    Assessment/Plan   82 y.o. female with medical history significant of diastolic CHF, dementia, CAD sp CABG, frequent falls, uncontrolled diabetes, labile HTN . Dehydration secondary to decreased by mouth intake and increase water intake with evidence of hyponatremia  Present on Admission:    . Dementia stable continue to monitor expect some degree of sundowning while in hospital  . Hyponatremia - - likely secondary to dehydration, will give IVF, check Urine Na, Cr, Osmolarity. Monitor Na levels to avoid over aggressive correction. Check TSH. Stop offending medications. If no improvement with IVF will initiate further work up for SIADH if appropriate.  . Acute encephalopathy in a setting of dehydration currently improving. Patient has recurrent paranoid hematuria will have Behavioral Health see her in consult  . Diastolic CHF, chronic (HCC) currently appears to be fluid down hold Lasix patient is not taking  . Dehydration - hold Lasix patient doesn't try to take it at home anyway. We'll give IV fluids and recheck electrolytes check orthostatics prior to discharge . PAF (paroxysmal atrial fibrillation) (HCC) - CHA2DS2 vas score 6 out of anticoagulation secondary to history of falls . CAD (coronary artery disease) stable continue home medications  Elevated troponin mildly continue to monitor no chest pain no evidence of acute MI likely secondary to dehydration, cardiac enzymes repeat echogram since having had one for past 2 years Other plan as per orders.  DVT prophylaxis:    Lovenox     Code Status:  FULL CODE  as per patient   Family Communication:   Family   at  Bedside  plan of care was discussed with   Tilman Neat Heinle [phone] Also please call [phone] Disposition Plan:     To home once workup is complete and patient is stable   Consults called: Fever health consulted    Admission status:   inpatient      Level of care  tele        [place] 02/19/2016, 8:03 PM    [facility]  Pager [phone]   after 2 AM please page floor coverage PA If 7AM-7PM, please contact the day team taking care of the patient  [email or web address]  Password [email or web address]
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
## 2016-02-19 NOTE — Progress Notes (Signed)
This Probation officer received a call for inpatient consult and it was documented for the on coming psychiatrist in the morning.     Chesley Noon, MSW, Darlyn Read [facility], [place] Triage Specialist [phone]

## 2016-02-19 NOTE — ED Notes (Signed)
Pt ambulated to bathroom with walker and staff standing by. Pt had steady, normal gait and denied any dizziness, lightheadedness, but did c/o weakness.

## 2016-02-19 NOTE — ED Provider Notes (Signed)
CSN: [id number]     Arrival date & time 02/19/16  1053 History  By signing my name below, I, Eustaquio Maize, attest that this documentation has been prepared under the direction and in the presence of Quintella Reichert, MD. Electronically Signed: Eustaquio Maize, ED Scribe. 02/19/2016. 3:28 PM.   Chief Complaint  Patient presents with  . Altered Mental Status  . Weakness   The history is provided by the patient. No language interpreter was used.    HPI Comments: Jill Howell is a 82 y.o. female with PMHx HTN, HLD, DM, CAD s/p MI/CABG, Atrial fibrillation, and TIA  who presents to the Emergency Department complaining of constant bilateral leg pain that began yesterday. Pt reports that she is unable to walk due to being so weak which is new for her. She has not eaten much today but reports eating well yesterday. Son mentions that pt had a tick bite on 01/28/2016 (approximately 3 weeks ago) that was removed by pt. Pt states that it was only on her for a short period of time before she removed it and cleaned the area with rubbing alcohol. Pt is alert and oriented to person, place, and time. Pt lives at home alone but family checks up on her once per day. Denies fever, cough, chest pain, abdominal pain, nausea, vomiting, or any other associated symptoms.   Per son - Pt has hx of dementia. She does not eat much due to believing that her son who lives in [place] has been attempting to poison her. Pt has believed this for the past year.   Past Medical History  Diagnosis Date  . Unspecified hearing loss   . Hypothyroidism   . Hyperlipidemia   . Esophageal reflux   . Type II or unspecified type diabetes mellitus without mention of complication, not stated as uncontrolled   . CAD (coronary artery disease)     a. 2000 s/p MI/CABG x 4 (VG-PDA, VG-OM, VG-Diag, LIMA-LAD);  b. 2011 MI;  c. 08/2013 NSTEMI/Cath: LM 90-95, LAD 90/100p, LCX 99p, 122m, RCA 100ost, VG-PDA nl, VG-OM 100 (culprit w/ thrombus), VG-Diag  nl, LIMA-LAD nl, EF 55-60%, mild inf HK, mod MR-->Med Rx.  . Chronic diastolic CHF (congestive heart failure) ([place])     a. 08/2013 Echo: EF 50-55%, no rwma, Gr2 DD, mod MR, mildly dil LA, PASP 54mmHg.  . Atrial fibrillation ([place])     a. remote->no anticoagulation 2/2 h/o falls.  . Asthma   . Allergic rhinitis, cause unspecified   . Hypertension   . History of TIA (transient ischemic attack)   . Carotid arterial disease ([place])     a. 05/2013 s/p L CEA.  [name] Anemia   . Myocardial infarction [facility])     08-2013   Past Surgical History  Procedure Laterality Date  . Tonsillectomy    . Partial hysterectomy    . Dilation and curettage of uterus    . Bladder tack      x 2  . Rectocele repair      x 2  . Cholecystectomy  10/2001  . Rotator cuff repair    . Coronary artery bypass graft  2000    4 vessel  . Hemorrhoid surgery    . Cardiac catheterization  09/07/1999    EF 25%. THERE IS SEVERE MITRAL ANNULAR CALIFICATION, WITH  3 + INSUFFICIENCY  . US echocardiography  01/17/2005    EF 55-60%  . Cardiovascular stress test  06/30/2007    EF 63%, NO ISCHEMIA  .  Eye surgery Bilateral     Cataract  . Endarterectomy Left 05/19/2013    Procedure: ENDARTERECTOMY CAROTID-LEFT;  Surgeon: Elam Dutch, MD;  Location: [facility] OR;  Service: Vascular;  Laterality: Left;  . Carotid endarterectomy Left 05-19-13    cea  . Left heart cath  Dec. 2, 2014    With  Coronary Graft  Angiogram  . Abdominal hysterectomy    . Left heart catheterization with coronary/graft angiogram  08/10/2013    Procedure: LEFT HEART CATHETERIZATION WITH Beatrix Fetters;  Surgeon: Peter M Martinique, MD;  Location: [facility] CATH LAB;  Service: Cardiovascular;;   Family History  Problem Relation Age of Onset  . Heart attack Father     X's 3  . Heart disease Father   . Heart disease Son     before age 50  . Cancer Brother   . Hypertension Mother   . Alzheimer's disease Mother    Social History  Substance Use Topics  .  Smoking status: Never Smoker   . Smokeless tobacco: Never Used  . Alcohol Use: No   OB History    No data available     Review of Systems  Constitutional: Negative for fever.  Respiratory: Negative for cough.   Cardiovascular: Negative for chest pain.  Gastrointestinal: Negative for nausea, vomiting and abdominal pain.  Musculoskeletal: Positive for gait problem.  Neurological: Positive for weakness.  All other systems reviewed and are negative.  Allergies  Furosemide and Penicillins  Home Medications   Prior to Admission medications   Medication Sig Start Date End Date Taking? Authorizing Provider  aspirin 81 MG EC tablet Take 81 mg by mouth daily.     Yes Historical Provider, MD  atenolol (TENORMIN) 25 MG tablet Take 0.5 tablets (12.5 mg total) by mouth daily. 02/14/16  Yes Minna Merritts, MD  BIOTIN PO Take 1 capsule by mouth daily.    Yes Historical Provider, MD  digoxin (DIGOX) 0.125 MG tablet Take 1 tablet (125 mcg total) by mouth daily. 09/05/14  Yes Darlin Coco, MD  isosorbide mononitrate (IMDUR) 30 MG 24 hr tablet TAKE 1 TABLET (30 MG TOTAL) BY MOUTH DAILY. 02/19/16  Yes Minna Merritts, MD  levothyroxine (SYNTHROID, LEVOTHROID) 100 MCG tablet TAKE 1 TABLET (100 MCG TOTAL) BY MOUTH DAILY. 02/15/16  Yes Tonia Ghent, MD  losartan (COZAAR) 25 MG tablet Take 1 tablet (25 mg total) by mouth daily. 12/20/15  Yes Minna Merritts, MD  Multiple Vitamins-Minerals (PRESERVISION AREDS PO) Take 1 tablet by mouth daily.   Yes Historical Provider, MD  nitroGLYCERIN (NITROSTAT) 0.4 MG SL tablet PLACE 1 TABLET UNDER THE TONGUE EVERY 5 MINUTES AS NEEDED FOR CHEST PAIN. 03/01/15  Yes Darlin Coco, MD  pantoprazole (PROTONIX) 40 MG tablet Take 1 tablet (40 mg total) by mouth daily. 02/14/16  Yes Minna Merritts, MD  simvastatin (ZOCOR) 40 MG tablet TAKE 1 TABLET (40 MG TOTAL) BY MOUTH EVERY EVENING. 05/01/15  Yes Darlin Coco, MD  vitamin B-12 (CYANOCOBALAMIN) 1000 MCG tablet Take  1,000 mcg by mouth daily.    Yes Historical Provider, MD  furosemide (LASIX) 20 MG tablet TAKE ONE TABLET (20MG ) BY MOUTH DAILY Patient not taking: Reported on 02/19/2016 03/30/15   Darlin Coco, MD   BP 158/64 mmHg  Pulse 77  Temp(Src) 98.3 F (36.8 C) (Oral)  Resp 17  Ht 5\' 3"  (1.6 m)  Wt 124 lb (56.246 kg)  BMI 21.97 kg/m2  SpO2 96%   Physical Exam  Constitutional:  She is oriented to person, place, and time. She appears well-developed and well-nourished.  Hard of hearing  HENT:  Head: Normocephalic and atraumatic.  Cardiovascular: Normal rate and regular rhythm.   No murmur heard. Pulmonary/Chest: Effort normal and breath sounds normal. No respiratory distress.  Abdominal: Soft. There is no tenderness. There is no rebound and no guarding.  Musculoskeletal: She exhibits no edema or tenderness.  Neurological: She is alert and oriented to person, place, and time.  No focal weakness Mildly confused  Skin: Skin is warm and dry.  Psychiatric: She has a normal mood and affect. Her behavior is normal.  Nursing note and vitals reviewed.   ED Course  Procedures (including critical care time)  DIAGNOSTIC STUDIES: Oxygen Saturation is 98% on RA, normal by my interpretation.    COORDINATION OF CARE: 3:25 PM-Discussed treatment plan which includes CT Head, CXR, EKG with pt/son at bedside and pt/son agreed to plan.   Labs Review Labs Reviewed  COMPREHENSIVE METABOLIC PANEL - Abnormal; Notable for the following:    Sodium 126 (*)    Chloride 90 (*)    Glucose, Bld 111 (*)    GFR calc non Af Amer 48 (*)    GFR calc Af Amer 56 (*)    All other components within normal limits  CBC WITH DIFFERENTIAL/PLATELET - Abnormal; Notable for the following:    WBC 10.9 (*)    Monocytes Absolute 1.6 (*)    All other components within normal limits  TROPONIN I - Abnormal; Notable for the following:    Troponin I 0.05 (*)    All other components within normal limits  DIGOXIN LEVEL -  Abnormal; Notable for the following:    Digoxin Level 0.3 (*)    All other components within normal limits  CBG MONITORING, ED - Abnormal; Notable for the following:    Glucose-Capillary 115 (*)    All other components within normal limits  URINALYSIS, ROUTINE W REFLEX MICROSCOPIC (NOT AT [facility])    Imaging Review Dg Chest 2 View  02/19/2016  CLINICAL DATA:  Confusion.  Weakness. EXAM: CHEST  2 VIEW COMPARISON:  04/04/2015 chest radiograph. FINDINGS: Sternotomy wires appear aligned and intact. CABG clips overlie the mediastinum. Soft tissue anchors overlie the bilateral humeral heads. Stable cardiomediastinal silhouette with mild cardiomegaly. No pneumothorax. Slight blunting of the costophrenic angles posteriorly could indicate trace bilateral pleural effusions. No overt pulmonary edema. No acute consolidative airspace disease. Lungs appear hyperinflated. IMPRESSION: 1. Stable mild cardiomegaly without overt pulmonary edema. 2. Slight blunting of the posterior costophrenic angles bilaterally, which suggests trace bilateral pleural effusions. 3. No acute pulmonary disease. Lungs appear hyperinflated, unchanged, suggesting obstructive lung disease. Electronically Signed   By: Ilona Sorrel M.D.   On: 02/19/2016 16:29   Ct Head Wo Contrast  02/19/2016  CLINICAL DATA:  Weakness. Bilateral lower extremity pain. Confusion. EXAM: CT HEAD WITHOUT CONTRAST TECHNIQUE: Contiguous axial images were obtained from the base of the skull through the vertex without intravenous contrast. COMPARISON:  01/27/2009 head CT. FINDINGS: No evidence of parenchymal hemorrhage or extra-axial fluid collection. No mass lesion, mass effect, or midline shift. No CT evidence of acute infarction. Intracranial atherosclerosis. Nonspecific mild to moderate subcortical and periventricular white matter hypodensity, most in keeping with chronic small vessel ischemic change. Cerebral volume is age appropriate. No ventriculomegaly. Minimal  opacification of the ethmoidal air cells. No fluid levels in the visualized paranasal sinuses. The mastoid air cells are unopacified. No evidence of calvarial fracture. IMPRESSION: 1.  No evidence of acute intracranial abnormality. 2. Mild-to-moderate chronic small vessel ischemia. Electronically Signed   By: Ilona Sorrel M.D.   On: 02/19/2016 16:15   I have personally reviewed and evaluated these images and lab results as part of my medical decision-making.   EKG Interpretation   Date/Time:  Monday February 19 2016 12:24:08 EDT Ventricular Rate:  85 PR Interval:  190 QRS Duration: 96 QT Interval:  388 QTC Calculation: 461 R Axis:   44 Text Interpretation:  Sinus rhythm with occasional Premature ventricular  complexes Inferior infarct , age undetermined Abnormal ECG Confirmed by  Hazle Coca [phone]) on 02/19/2016 3:29:41 PM      MDM   Final diagnoses:  Hyponatremia  Altered mental status, unspecified altered mental status type   Patient here for evaluation of generalized weakness. Family states she is more confused and she is paranoid that they're poisoning her and refuses to eat or drink. She is mildly dehydrated on examination with hypernatremia. She is mildly confused with no focal neurologic deficits. Plan to admit for IV fluid hydration and further observation/treatment.  I personally performed the services described in this documentation, which was scribed in my presence. The recorded information has been reviewed and is accurate.     Quintella Reichert, MD 02/20/16 [phone]

## 2016-02-19 NOTE — ED Notes (Signed)
Pt. Transported to CT at this time.  

## 2016-02-20 ENCOUNTER — Other Ambulatory Visit (HOSPITAL_COMMUNITY): Payer: Self-pay

## 2016-02-20 DIAGNOSIS — L8990 Pressure ulcer of unspecified site, unspecified stage: Secondary | ICD-10-CM | POA: Insufficient documentation

## 2016-02-20 DIAGNOSIS — F39 Unspecified mood [affective] disorder: Secondary | ICD-10-CM | POA: Insufficient documentation

## 2016-02-20 DIAGNOSIS — F062 Psychotic disorder with delusions due to known physiological condition: Secondary | ICD-10-CM

## 2016-02-20 DIAGNOSIS — F29 Unspecified psychosis not due to a substance or known physiological condition: Secondary | ICD-10-CM | POA: Insufficient documentation

## 2016-02-20 LAB — CBC
HEMATOCRIT: 37.7 % (ref 36.0–46.0)
Hemoglobin: 12.6 g/dL (ref 12.0–15.0)
MCH: 28.5 pg (ref 26.0–34.0)
MCHC: 33.4 g/dL (ref 30.0–36.0)
MCV: 85.3 fL (ref 78.0–100.0)
Platelets: 191 10*3/uL (ref 150–400)
RBC: 4.42 MIL/uL (ref 3.87–5.11)
RDW: 13.2 % (ref 11.5–15.5)
WBC: 10.7 10*3/uL — ABNORMAL HIGH (ref 4.0–10.5)

## 2016-02-20 LAB — COMPREHENSIVE METABOLIC PANEL
ALT: 23 U/L (ref 14–54)
AST: 31 U/L (ref 15–41)
Albumin: 4.1 g/dL (ref 3.5–5.0)
Alkaline Phosphatase: 73 U/L (ref 38–126)
Anion gap: 9 (ref 5–15)
BILIRUBIN TOTAL: 1.3 mg/dL — AB (ref 0.3–1.2)
BUN: 15 mg/dL (ref 6–20)
CHLORIDE: 99 mmol/L — AB (ref 101–111)
CO2: 27 mmol/L (ref 22–32)
Calcium: 9.3 mg/dL (ref 8.9–10.3)
Creatinine, Ser: 0.81 mg/dL (ref 0.44–1.00)
GFR calc Af Amer: >60 mL/min (ref >60)
GFR calc non Af Amer: 60 mL/min — ABNORMAL LOW (ref >60)
GLUCOSE: 108 mg/dL — AB (ref 65–99)
POTASSIUM: 3.8 mmol/L (ref 3.5–5.1)
SODIUM: 135 mmol/L (ref 135–145)
Total Protein: 6.9 g/dL (ref 6.5–8.1)

## 2016-02-20 LAB — TROPONIN I
TROPONIN I: 0.04 ng/mL — AB (ref <0.031)
Troponin I: 0.06 ng/mL — ABNORMAL HIGH (ref <0.031)
Troponin I: 0.05 ng/mL — ABNORMAL HIGH (ref <0.031)

## 2016-02-20 LAB — GLUCOSE, CAPILLARY
GLUCOSE-CAPILLARY: 155 mg/dL — AB (ref 65–99)
Glucose-Capillary: 105 mg/dL — ABNORMAL HIGH (ref 65–99)
Glucose-Capillary: 124 mg/dL — ABNORMAL HIGH (ref 65–99)

## 2016-02-20 LAB — MAGNESIUM: MAGNESIUM: 2 mg/dL (ref 1.7–2.4)

## 2016-02-20 LAB — PHOSPHORUS: Phosphorus: 3.3 mg/dL (ref 2.5–4.6)

## 2016-02-20 LAB — TSH: TSH: 1.306 u[IU]/mL (ref 0.350–4.500)

## 2016-02-20 MED ORDER — SODIUM CHLORIDE 0.9 % IV SOLN
INTRAVENOUS | Status: DC
Start: 2016-02-20 — End: 2016-02-20
  Administered 2016-02-20: 11:00:00 via INTRAVENOUS

## 2016-02-20 MED ORDER — LEVOTHYROXINE SODIUM 100 MCG PO TABS
100.0000 ug | ORAL_TABLET | Freq: Every day | ORAL | Status: DC
  Administered 2016-02-21: 100 ug via ORAL
  Filled 2016-02-20: qty 1

## 2016-02-20 MED ORDER — ACETAMINOPHEN 325 MG PO TABS
650.0000 mg | ORAL_TABLET | Freq: Four times a day (QID) | ORAL | Status: DC | PRN
  Administered 2016-02-20: 650 mg via ORAL
  Filled 2016-02-20: qty 2

## 2016-02-20 MED ORDER — DIGOXIN 125 MCG PO TABS
125.0000 ug | ORAL_TABLET | Freq: Every day | ORAL | Status: DC
  Administered 2016-02-20 – 2016-02-21 (×2): 125 ug via ORAL
  Filled 2016-02-20 (×2): qty 1

## 2016-02-20 MED ORDER — ATENOLOL 12.5 MG HALF TABLET
12.5000 mg | ORAL_TABLET | Freq: Every day | ORAL | Status: DC
  Administered 2016-02-20 – 2016-02-21 (×2): 12.5 mg via ORAL
  Filled 2016-02-20 (×2): qty 1

## 2016-02-20 MED ORDER — INSULIN ASPART 100 UNIT/ML ~~LOC~~ SOLN: 0.0000 [IU] | Freq: Every day | SUBCUTANEOUS | Status: DC

## 2016-02-20 MED ORDER — ONDANSETRON HCL 4 MG PO TABS: 4.0000 mg | ORAL_TABLET | Freq: Four times a day (QID) | ORAL | Status: DC | PRN

## 2016-02-20 MED ORDER — SIMVASTATIN 40 MG PO TABS
40.0000 mg | ORAL_TABLET | Freq: Every day | ORAL | Status: DC
  Administered 2016-02-20: 40 mg via ORAL
  Filled 2016-02-20: qty 1

## 2016-02-20 MED ORDER — SODIUM CHLORIDE 0.9% FLUSH
3.0000 mL | Freq: Two times a day (BID) | INTRAVENOUS | Status: DC
  Administered 2016-02-20 – 2016-02-21 (×3): 3 mL via INTRAVENOUS

## 2016-02-20 MED ORDER — ACETAMINOPHEN 650 MG RE SUPP: 650.0000 mg | Freq: Four times a day (QID) | RECTAL | Status: DC | PRN

## 2016-02-20 MED ORDER — ISOSORBIDE MONONITRATE ER 30 MG PO TB24
30.0000 mg | ORAL_TABLET | Freq: Every day | ORAL | Status: DC
  Administered 2016-02-20 – 2016-02-21 (×2): 30 mg via ORAL
  Filled 2016-02-20 (×3): qty 1

## 2016-02-20 MED ORDER — ENOXAPARIN SODIUM 40 MG/0.4ML ~~LOC~~ SOLN
40.0000 mg | Freq: Every day | SUBCUTANEOUS | Status: DC
  Administered 2016-02-20: 40 mg via SUBCUTANEOUS
  Filled 2016-02-20 (×3): qty 0.4

## 2016-02-20 MED ORDER — ONDANSETRON HCL 4 MG/2ML IJ SOLN: 4.0000 mg | Freq: Four times a day (QID) | INTRAMUSCULAR | Status: DC | PRN

## 2016-02-20 MED ORDER — HYDROCODONE-ACETAMINOPHEN 5-325 MG PO TABS: 1.0000 | ORAL_TABLET | ORAL | Status: DC | PRN

## 2016-02-20 MED ORDER — LOSARTAN POTASSIUM 25 MG PO TABS
25.0000 mg | ORAL_TABLET | Freq: Every day | ORAL | Status: DC
  Administered 2016-02-20 – 2016-02-21 (×2): 25 mg via ORAL
  Filled 2016-02-20 (×3): qty 1

## 2016-02-20 MED ORDER — OLANZAPINE 2.5 MG PO TABS
2.5000 mg | ORAL_TABLET | Freq: Every day | ORAL | Status: DC
  Filled 2016-02-20 (×2): qty 1

## 2016-02-20 MED ORDER — PANTOPRAZOLE SODIUM 40 MG PO TBEC
40.0000 mg | DELAYED_RELEASE_TABLET | Freq: Every day | ORAL | Status: DC
  Administered 2016-02-20 – 2016-02-21 (×2): 40 mg via ORAL
  Filled 2016-02-20 (×2): qty 1

## 2016-02-20 MED ORDER — ASPIRIN EC 81 MG PO TBEC
81.0000 mg | DELAYED_RELEASE_TABLET | Freq: Every day | ORAL | Status: DC
  Administered 2016-02-20 – 2016-02-21 (×2): 81 mg via ORAL
  Filled 2016-02-20 (×2): qty 1

## 2016-02-20 MED ORDER — INSULIN ASPART 100 UNIT/ML ~~LOC~~ SOLN
0.0000 [IU] | Freq: Three times a day (TID) | SUBCUTANEOUS | Status: DC
  Administered 2016-02-20: 1 [IU] via SUBCUTANEOUS

## 2016-02-20 MED ORDER — LEVOTHYROXINE SODIUM 100 MCG PO TABS
100.0000 ug | ORAL_TABLET | Freq: Every day | ORAL | Status: DC
  Administered 2016-02-20: 100 ug via ORAL
  Filled 2016-02-20 (×2): qty 1

## 2016-02-20 MED ORDER — INSULIN ASPART 100 UNIT/ML ~~LOC~~ SOLN: 0.0000 [IU] | Freq: Three times a day (TID) | SUBCUTANEOUS | Status: DC

## 2016-02-20 NOTE — Progress Notes (Signed)
PROGRESS NOTE        PATIENT DETAILS Name: Jill Howell Age: 82 y.o. Sex: female Date of Birth: Oct 05, 1918 Admit Date: 02/19/2016 Admitting Physician Toy Baker, MD [id number] Damita Dunnings, MD  Brief Narrative: Patient is a 82 y.o. female dementia, chronic diastolic heart failure, CAD status post CABG who was brought in by family for evaluation of her weakness. Found to have hyponatremia and admitted for further evaluation and treatment  Subjective: Seems to be awake and alert this morning, no indication of any confusion at all.  Assessment/Plan: Principal Problem: Hyponatremia: Probably secondary to dehydration and excessive free water intake. Resolved with IV fluids, is currently clinically euvolemic on exam-stop all IV fluids and monitor. Encouraged oral intake. Have advised against excessive free water intake.  Active Problems: Acute encephalopathy: CT head negative for acute abnormalities, suspect mild acute encephalopathy due to dehydration and hyponatremia-it seems that she is significantly better and is close to her usual baseline. UA and chest x-ray negative for infection.  Chronic diastolic heart failure: Clinically compensated, likely euvolemic-suspect we could hold off on starting diuretics at this time.  Minimally elevated troponins: Trend is flat not consistent with ACS.   History of paroxysmal atrial fibrillation: Rate controlled with atenolol, digoxin-suspect not a anticoagulation candidate given dementia, frailty and fall risk.CHADS2VASC for at least 5  CAD-status post CABG: Chest pain free, no shortness of breath. Continue aspirin, beta blocker and statin  Dementia with history of paranoid delirium: Currently seems pleasant, and does not appear to have any paranoia. Await psychiatry evaluation-call by admitting M.D.  Hypertension: Controlled, continue atenolol, losartan and Imdur  Hypothyroidism: Continue levothyroxine  DVT  Prophylaxis: Prophylactic Lovenox   Code Status: Full code   Family Communication: Son at bedside  Disposition Plan: Remain inpatient- Home health vs SNF on discharge-in next 1-2 days. Await PT eval  Antimicrobial agents: None  Procedures: None  CONSULTS:  psychiatry  Time spent: 25 minutes-Greater than 50% of this time was spent in counseling, explanation of diagnosis, planning of further management, and coordination of care.  MEDICATIONS: Anti-infectives    None      Scheduled Meds: . aspirin EC  81 mg Oral Daily  . atenolol  12.5 mg Oral Daily  . digoxin  125 mcg Oral Daily  . enoxaparin (LOVENOX) injection  40 mg Subcutaneous Daily  . insulin aspart  0-5 Units Subcutaneous QHS  . insulin aspart  0-9 Units Subcutaneous TID WC  . isosorbide mononitrate  30 mg Oral Daily  . levothyroxine  100 mcg Oral QAC breakfast  . losartan  25 mg Oral Daily  . pantoprazole  40 mg Oral Daily  . simvastatin  40 mg Oral q1800  . sodium chloride flush  3 mL Intravenous Q12H   Continuous Infusions: . sodium chloride 75 mL/hr at 02/20/16 1030   PRN Meds:.acetaminophen **OR** acetaminophen, HYDROcodone-acetaminophen, ondansetron **OR** ondansetron (ZOFRAN) IV   PHYSICAL EXAM: Vital signs: Filed Vitals:   02/20/16 0200 02/20/16 0400 02/20/16 0851 02/20/16 1223  BP: 131/46 144/55 143/50 138/60  Pulse: 78 73 88 86  Temp:   97.7 F (36.5 C) 97.5 F (36.4 C)  TempSrc:   Oral Oral  Resp: 13 14 18 18   Height:   5\' 3"  (1.6 m)   Weight:   53.87 kg (118 lb 12.2 oz)   SpO2: 94% 100% 95% 97%  Filed Weights   02/19/16 1213 02/20/16 0851  Weight: 56.246 kg (124 lb) 53.87 kg (118 lb 12.2 oz)   Body mass index is 21.04 kg/(m^2).   Gen Exam: Awake and alert with clear speech. Not in any distress Neck: Supple, No JVD.   Chest: B/L Clear.   CVS: S1 S2 Regular, no murmurs.  Abdomen: soft, BS +, non tender, non distended.  Extremities: no edema, lower extremities warm to  touch. Neurologic: Non Focal.   Skin: No Rash or lesions   Wounds: N/A.   LABORATORY DATA: CBC:  Recent Labs Lab 02/19/16 1544 02/20/16 0528  WBC 10.9* 10.7*  NEUTROABS 5.9  --   HGB 13.0 12.6  HCT 38.4 37.7  MCV 85.3 85.3  PLT 189 [id number]    Basic Metabolic Panel:  Recent Labs Lab 02/19/16 1225 02/20/16 0528  NA 126* 135  K 3.8 3.8  CL 90* 99*  CO2 24 27  GLUCOSE 111* 108*  BUN 19 15  CREATININE 0.97 0.81  CALCIUM 9.7 9.3  MG  --  2.0  PHOS  --  3.3    GFR: Estimated Creatinine Clearance: 34.4 mL/min (by C-G formula based on Cr of 0.81).  Liver Function Tests:  Recent Labs Lab 02/19/16 1225 02/20/16 0528  AST 28 31  ALT 23 23  ALKPHOS 76 73  BILITOT 1.2 1.3*  PROT 7.5 6.9  ALBUMIN 4.4 4.1   No results for input(s): LIPASE, AMYLASE in the last 168 hours. No results for input(s): AMMONIA in the last 168 hours.  Coagulation Profile: No results for input(s): INR, PROTIME in the last 168 hours.  Cardiac Enzymes:  Recent Labs Lab 02/19/16 1544 02/20/16 0224 02/20/16 0757  TROPONINI 0.05* 0.06* 0.05*    BNP (last 3 results) No results for input(s): PROBNP in the last 8760 hours.  HbA1C: No results for input(s): HGBA1C in the last 72 hours.  CBG:  Recent Labs Lab 02/19/16 1223 02/20/16 1137  GLUCAP 115* 105*    Lipid Profile: No results for input(s): CHOL, HDL, LDLCALC, TRIG, CHOLHDL, LDLDIRECT in the last 72 hours.  Thyroid Function Tests:  Recent Labs  02/20/16 0528  TSH 1.306    Anemia Panel: No results for input(s): VITAMINB12, FOLATE, FERRITIN, TIBC, IRON, RETICCTPCT in the last 72 hours.  Urine analysis:    Component Value Date/Time   COLORURINE YELLOW 02/19/2016 [place] 02/19/2016 1707   LABSPEC 1.009 02/19/2016 1707   PHURINE 6.5 02/19/2016 1707   GLUCOSEU NEGATIVE 02/19/2016 1707   HGBUR NEGATIVE 02/19/2016 1707   BILIRUBINUR NEGATIVE 02/19/2016 1707   BILIRUBINUR Negative 03/21/2011 1235    KETONESUR NEGATIVE 02/19/2016 1707   PROTEINUR NEGATIVE 02/19/2016 1707   PROTEINUR Trace 03/21/2011 1235   UROBILINOGEN 0.2 04/26/2015 1350   UROBILINOGEN 0.2 03/21/2011 1235   NITRITE NEGATIVE 02/19/2016 1707   NITRITE Negative 03/21/2011 1235   LEUKOCYTESUR NEGATIVE 02/19/2016 1707    Sepsis Labs: Lactic Acid, Venous No results found for: LATICACIDVEN  MICROBIOLOGY: No results found for this or any previous visit (from the past 240 hour(s)).  RADIOLOGY STUDIES/RESULTS: Dg Chest 2 View  02/19/2016  CLINICAL DATA:  Confusion.  Weakness. EXAM: CHEST  2 VIEW COMPARISON:  04/04/2015 chest radiograph. FINDINGS: Sternotomy wires appear aligned and intact. CABG clips overlie the mediastinum. Soft tissue anchors overlie the bilateral humeral heads. Stable cardiomediastinal silhouette with mild cardiomegaly. No pneumothorax. Slight blunting of the costophrenic angles posteriorly could indicate trace bilateral pleural effusions. No overt pulmonary edema. No  acute consolidative airspace disease. Lungs appear hyperinflated. IMPRESSION: 1. Stable mild cardiomegaly without overt pulmonary edema. 2. Slight blunting of the posterior costophrenic angles bilaterally, which suggests trace bilateral pleural effusions. 3. No acute pulmonary disease. Lungs appear hyperinflated, unchanged, suggesting obstructive lung disease. Electronically Signed   By: Ilona Sorrel M.D.   On: 02/19/2016 16:29   Ct Head Wo Contrast  02/19/2016  CLINICAL DATA:  Weakness. Bilateral lower extremity pain. Confusion. EXAM: CT HEAD WITHOUT CONTRAST TECHNIQUE: Contiguous axial images were obtained from the base of the skull through the vertex without intravenous contrast. COMPARISON:  01/27/2009 head CT. FINDINGS: No evidence of parenchymal hemorrhage or extra-axial fluid collection. No mass lesion, mass effect, or midline shift. No CT evidence of acute infarction. Intracranial atherosclerosis. Nonspecific mild to moderate subcortical and  periventricular white matter hypodensity, most in keeping with chronic small vessel ischemic change. Cerebral volume is age appropriate. No ventriculomegaly. Minimal opacification of the ethmoidal air cells. No fluid levels in the visualized paranasal sinuses. The mastoid air cells are unopacified. No evidence of calvarial fracture. IMPRESSION: 1.  No evidence of acute intracranial abnormality. 2. Mild-to-moderate chronic small vessel ischemia. Electronically Signed   By: Ilona Sorrel M.D.   On: 02/19/2016 16:15     LOS: 1 day   Oren Binet, MD  [facility] Pager:[phone]  If 7PM-7AM, please contact night-coverage [email or web address] Password [facility] 02/20/2016, 2:39 PM

## 2016-02-20 NOTE — Evaluation (Signed)
Physical Therapy Evaluation Patient Details Name: Jill Howell MRN: [id number] DOB: Nov 25, 1918 Today's Date: 02/20/2016   History of Present Illness  Pt is a 82 y/o F admitted after 24 hr episode of confusion suspected to be due to dehydration.  Pt's PMH includes dementia, CAD, CHF, a-fib, TIA, anemia, MI, Rotator cuff repair.    Clinical Impression  Pt admitted with above diagnosis. Pt currently with functional limitations due to the deficits listed below (see PT Problem List). Jill Howell presents w/ balance impairments placing her at an increased risk for falls.  Supervision provided while ambulating w/ RW and min assist needed due to LOB x1 w/ stair training.  Discussed w/ pt and son that she will need 24/7 supervision at d/c and pt agreeable and understanding. Son reports he is unable to provide this level of assist. Pt says she will be trying to coordinate a friend to stay w/ her. Pt will benefit from skilled PT to increase their independence and safety with mobility to allow discharge to the venue listed below.      Follow Up Recommendations Home health PT;Supervision/Assistance - 24 hour    Equipment Recommendations  None recommended by PT    Recommendations for Other Services OT consult     Precautions / Restrictions Precautions Precautions: Fall Restrictions Weight Bearing Restrictions: No      Mobility  Bed Mobility Overal bed mobility: Modified Independent             General bed mobility comments: Increased time  Transfers Overall transfer level: Needs assistance Equipment used: Rolling walker (2 wheeled) Transfers: Sit to/from Stand Sit to Stand: Min assist         General transfer comment: Assist to boost up to standing, pt w/ slight posterior lean.  Cues for hand placement and to keep her RW with her when turning to sit.  Ambulation/Gait Ambulation/Gait assistance: Supervision Ambulation Distance (Feet): 200 Feet Assistive device: Rolling walker (2  wheeled) Gait Pattern/deviations: Trunk flexed;Decreased stride length;Step-through pattern   Gait velocity interpretation: at or above normal speed for age/gender General Gait Details: Trunk flexed which improves minimally w/ cues for upright posture.    Stairs Stairs: Yes Stairs assistance: Min assist Stair Management: One rail Right;Two rails;Step to pattern;Forwards Number of Stairs: 5 General stair comments: Pt holds onto Bil rails during ascent and Lt rail only during descent.  LOB x1 down last step, requiring assist to steady.  Wheelchair Mobility    Modified Rankin (Stroke Patients Only)       Balance Overall balance assessment: Needs assistance;History of Falls Sitting-balance support: Feet supported;No upper extremity supported Sitting balance-Leahy Scale: Good     Standing balance support: During functional activity;Bilateral upper extremity supported Standing balance-Leahy Scale: Poor Standing balance comment: Relies on UE support                             Pertinent Vitals/Pain Pain Assessment: No/denies pain    Home Living Family/patient expects to be discharged to:: Private residence Living Arrangements: Children Available Help at Discharge: Family;Available PRN/intermittently Type of Home: House Home Access: Stairs to enter Entrance Stairs-Rails: Right Entrance Stairs-Number of Steps: 2 Home Layout: One level Home Equipment: Walker - 2 wheels;Cane - single point;Bedside commode Additional Comments: Son lives w/ pt and works on same property as Dealer so is not w/ pt during the day.     Prior Function Level of Independence: Independent with assistive device(s)  Comments: Still driving.  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  Reports several falls over the past 6 months in which her legs "give way".  She uses her RW and has her BSC over top of her toilet.  Both sons working during the day and unable to provide 24/7  supervision.     Hand Dominance   Dominant Hand: Right    Extremity/Trunk Assessment   Upper Extremity Assessment: Overall WFL for tasks assessed           Lower Extremity Assessment: Overall WFL for tasks assessed      Cervical / Trunk Assessment: Kyphotic  Communication   Communication: HOH  Cognition Arousal/Alertness: Awake/alert Behavior During Therapy: WFL for tasks assessed/performed Overall Cognitive Status: Within Functional Limits for tasks assessed                      General Comments General comments (skin integrity, edema, etc.): Discussed w/ pt that she will need 24/7 supervision at d/c and pt agreeable and understanding.  She will be trying to coordinate a friend to stay w/ her.    Exercises General Exercises - Lower Extremity Ankle Circles/Pumps: AROM;Both;10 reps;Seated Long Arc Quad: AROM;Both;10 reps;Seated      Assessment/Plan    PT Assessment Patient needs continued PT services  PT Diagnosis Difficulty walking   PT Problem List Decreased balance;Decreased knowledge of use of DME;Decreased safety awareness  PT Treatment Interventions DME instruction;Gait training;Stair training;Functional mobility training;Therapeutic activities;Therapeutic exercise;Balance training;Patient/family education   PT Goals (Current goals can be found in the Care Plan section) Acute Rehab PT Goals Patient Stated Goal: to go home PT Goal Formulation: With patient Time For Goal Achievement: 03/05/16 Potential to Achieve Goals: Good    Frequency Min 3X/week   Barriers to discharge Inaccessible home environment;Decreased caregiver support Alone during the day and steps to enter home    Co-evaluation               End of Session Equipment Utilized During Treatment: Gait belt Activity Tolerance: Patient tolerated treatment well Patient left: in chair;with call bell/phone within reach;with chair alarm set Nurse Communication: Mobility status          Time: 1054-1130 PT Time Calculation (min) (ACUTE ONLY): 36 min   Charges:   PT Evaluation $PT Eval Low Complexity: 1 Procedure PT Treatments $Gait Training: 8-22 mins   PT G Codes:       Jill Howell PT, DPT  Pager: [phone] Phone: [phone] 02/20/2016, 11:52 AM

## 2016-02-20 NOTE — Consult Note (Signed)
[facility] Face-to-Face Psychiatry Consult   Reason for Consult:  Paranoid psychosis and history of dementia Referring Physician:  Dr. Sloan Leiter Patient Identification: Jill Howell MRN:  [id number] Principal Diagnosis: Dementia Diagnosis:   Patient Active Problem List   Diagnosis Date Noted  . CAD (coronary artery disease) [I25.10] 02/19/2016  . Dementia [F03.90] 02/19/2016  . Hyponatremia [E87.1] 02/19/2016  . Acute encephalopathy [G93.40] 02/19/2016  . Diastolic CHF, chronic ([place]) [I50.32] 02/19/2016  . Dehydration [E86.0] 02/19/2016  . Chest wall pain [R07.89] 04/05/2015  . Carotid stenosis [I65.29] 06/09/2014  . Aftercare following surgery of the circulatory system [Z48.812] 06/09/2014  . Asthmatic bronchitis , chronic ([place]) [J44.9, B01.751] 03/24/2014  . Family discord [Z63.9] 02/18/2014  . Aftercare following surgery of the circulatory system, [place] [Z48.812] 12/07/2013  . Malaise and fatigue [R53.81, R53.83] 12/01/2013  . Benign hypertensive heart disease without heart failure [I11.9] 08/17/2013  . At risk for falls [Z91.81] 08/12/2013  . Anemia [D64.9]   . Unstable angina ([place]) [I20.0] 08/10/2013  . NSTEMI (non-ST elevated myocardial infarction) ([place]) [I21.4] 08/10/2013  . TIA (transient ischemic attack) [G45.9] 05/17/2013  . Occlusion and stenosis of carotid artery without mention of cerebral infarction [I65.29] 05/13/2013  . Fall at home [W19.Merril Abbe, W25.852] 05/11/2013  . Leg pain [M79.606] 08/11/2012  . PAF (paroxysmal atrial fibrillation) ([place]) [I48.0] 08/11/2012  . Back pain [M54.9] 05/07/2012  . Carotid artery disease ([place]) [I77.9] 08/19/2011  . Dermatitis [L30.9] 05/09/2011  . HYPOTHYROIDISM [E03.9] 09/15/2007  . History of diabetes mellitus, type II [Z86.39] 09/15/2007  . Hyperlipidemia [E78.5] 09/15/2007  . HEARING IMPAIRMENT [H91.90] 09/15/2007  . Coronary atherosclerosis [I25.10] 09/15/2007  . Congestive heart failure ([name]) [I50.9] 09/15/2007  . ALLERGIC RHINITIS  [J30.9] 09/15/2007  . Unspecified asthma(493.90) [D78.242] 09/15/2007  . GERD [K21.9] 09/15/2007    Total Time spent with patient: 1 hour  Subjective:   Jill Howell is a 82 y.o. female patient admitted with dementia, confusion and paranoia.  HPI:  Jill Howell is a 82 y.o. female with medical history significant of diastolic CHF, dementia, CAD sp CABG, frequent falls, uncontrolled diabetes, labile HTNAnd [facility] with increased confusion for the past 24 hours which resulted in dehydration, hyponatremia decreased oral intake.She was worried that some one was poisoning her and stop eating. She still drives, but has occasional moments of confusion. Patient gets episodes of confusion and paranoid behavior and she is worried that people are trying to hurt her. She reports she's been drinking larger cups of water And also feeling dehydrated because she could not keep the fluids and she does not know what to do.  Patient stated her son who lives close by came home and saw her she been so weak, tired and not by herself seeking for appropriate medical care. Patient denies current symptoms of depression, anxiety, auditory or visual hallucinations. Patient has no active suicidal/homicidal ideation, intention or plans.  Patient stated sometimes she forgets for and could not name the streets that she drives and also forget the names of the grocery stores she has been shopping around. Patient stated that she is not ready to go to nursing home and she won't stay in her own home and he get the help she can. Patient reported that her primary care physician who he will be retiring soon think she is having multiple mini strokes. Patient also has multiple bypass surgeries in the past and Acute MI's and pneumonia  Past Psychiatric History: Patient denied history of acute psychiatric  hospitalization or psychiatric conditions.   Risk to Self:   Risk to Others:   Prior Inpatient Therapy:    Prior Outpatient Therapy:    Past Medical History:  Past Medical History  Diagnosis Date  . Unspecified hearing loss   . Hypothyroidism   . Hyperlipidemia   . Esophageal reflux   . Type II or unspecified type diabetes mellitus without mention of complication, not stated as uncontrolled   . CAD (coronary artery disease)     a. 2000 s/p MI/CABG x 4 (VG-PDA, VG-OM, VG-Diag, LIMA-LAD);  b. 2011 MI;  c. 08/2013 NSTEMI/Cath: LM 90-95, LAD 90/100p, LCX 99p, 158m RCA 100ost, VG-PDA nl, VG-OM 100 (culprit w/ thrombus), VG-Diag nl, LIMA-LAD nl, EF 55-60%, mild inf HK, mod MR-->Med Rx.  . Chronic diastolic CHF (congestive heart failure) ([place]     a. 08/2013 Echo: EF 50-55%, no rwma, Gr2 DD, mod MR, mildly dil LA, PASP 542mg.  . Atrial fibrillation ([place]    a. remote->no anticoagulation 2/2 h/o falls.  . Asthma   . Allergic rhinitis, cause unspecified   . Hypertension   . History of TIA (transient ischemic attack)   . Carotid arterial disease ([place]    a. 05/2013 s/p L CEA.  . Marland Kitchennemia   . [facility]    [facility]    Past Surgical History  Procedure Laterality Date  . Tonsillectomy    . Partial hysterectomy    . Dilation and curettage of uterus    . Bladder tack      x 2  . Rectocele repair      x 2  . Cholecystectomy  10/2001  . Rotator cuff repair    . Coronary artery bypass graft  2000    4 vessel  . Hemorrhoid surgery    . Cardiac catheterization  09/07/1999    EF 25%. THERE IS SEVERE MITRAL ANNULAR CALIFICATION, WITH  3 + INSUFFICIENCY  . UsKoreachocardiography  01/17/2005    EF 55-60%  . Cardiovascular stress test  06/30/2007    EF 63%, NO ISCHEMIA  . Eye surgery Bilateral     Cataract  . Endarterectomy Left 05/19/2013    Procedure: ENDARTERECTOMY CAROTID-LEFT;  Surgeon: ChElam DutchMD;  [facility];  Service: Vascular;  Laterality: Left;  . Carotid endarterectomy Left 05-19-13    cea  . Left heart cath  Dec. 2, 2014    With  Coronary Graft   Angiogram  . Abdominal hysterectomy    . Left heart catheterization with coronary/graft angiogram  08/10/2013    Procedure: LEFT HEART CATHETERIZATION WITH COBeatrix Fetters Surgeon: Peter M JoMartiniqueMD;  [facility];  Service: Cardiovascular;;   Family History:  Family History  Problem Relation Age of Onset  . Heart attack Father     X's 3  . Heart disease Father   . Heart disease Son     before age 80. Cancer Brother   . Hypertension Mother   . Alzheimer's disease Mother    Family Psychiatric  History: Patient has no family history of mental illness.  Social History:  History  Alcohol Use No     History  Drug Use No    Social History   Social History  . Marital Status: Widowed    Spouse Name: N/A  . Number of Children: 4  . Years of Education: N/A   Occupational History  . Retired    Social History Main Topics  .  Smoking status: Never Smoker   . Smokeless tobacco: Never Used  . Alcohol Use: No  . Drug Use: No  . Sexual Activity: Not Currently   Other Topics Concern  . None   Social History Narrative   Widowed      4 sons      Retired   Additional Social History:    Allergies:   Allergies  Allergen Reactions  . Furosemide Other (See Comments)    Patient prefers not to take med, weakness and frequent urination  . Penicillins Other (See Comments)    unknown    Labs:  Results for orders placed or performed during the hospital encounter of 02/19/16 (from the past 48 hour(s))  CBG monitoring, ED     Status: Abnormal   Collection Time: 02/19/16 12:23 PM  Result Value Ref Range   Glucose-Capillary 115 (H) 65 - 99 mg/dL  Comprehensive metabolic panel     Status: Abnormal   Collection Time: 02/19/16 12:25 PM  Result Value Ref Range   Sodium 126 (L) 135 - 145 mmol/L   Potassium 3.8 3.5 - 5.1 mmol/L   Chloride 90 (L) 101 - 111 mmol/L   CO2 24 22 - 32 mmol/L   Glucose, Bld 111 (H) 65 - 99 mg/dL   BUN 19 6 - 20 mg/dL   Creatinine,  Ser 0.97 0.44 - 1.00 mg/dL   Calcium 9.7 8.9 - 10.3 mg/dL   Total Protein 7.5 6.5 - 8.1 g/dL   Albumin 4.4 3.5 - 5.0 g/dL   AST 28 15 - 41 U/L   ALT 23 14 - 54 U/L   Alkaline Phosphatase 76 38 - 126 U/L   Total Bilirubin 1.2 0.3 - 1.2 mg/dL   GFR calc non Af Amer 48 (L) >60 mL/min   GFR calc Af Amer 56 (L) >60 mL/min    Comment: (NOTE) The eGFR has been calculated using the CKD EPI equation. This calculation has not been validated in all clinical situations. eGFR's persistently <60 mL/min signify possible Chronic Kidney Disease.    Anion gap 12 5 - 15  CBC with Differential     Status: Abnormal   Collection Time: 02/19/16  3:44 PM  Result Value Ref Range   WBC 10.9 (H) 4.0 - 10.5 K/uL   RBC 4.50 3.87 - 5.11 MIL/uL   Hemoglobin 13.0 12.0 - 15.0 g/dL   HCT 38.4 36.0 - 46.0 %   MCV 85.3 78.0 - 100.0 fL   MCH 28.9 26.0 - 34.0 pg   MCHC 33.9 30.0 - 36.0 g/dL   RDW 13.2 11.5 - 15.5 %   Platelets 189 150 - 400 K/uL   Neutrophils Relative % 53 %   Neutro Abs 5.9 1.7 - 7.7 K/uL   Lymphocytes Relative 31 %   Lymphs Abs 3.4 0.7 - 4.0 K/uL   Monocytes Relative 15 %   Monocytes Absolute 1.6 (H) 0.1 - 1.0 K/uL   Eosinophils Relative 1 %   Eosinophils Absolute 0.1 0.0 - 0.7 K/uL   Basophils Relative 0 %   Basophils Absolute 0.0 0.0 - 0.1 K/uL  Troponin I     Status: Abnormal   Collection Time: 02/19/16  3:44 PM  Result Value Ref Range   Troponin I 0.05 (H) <0.031 ng/mL    Comment:        PERSISTENTLY INCREASED TROPONIN VALUES IN THE RANGE OF 0.04-0.49 ng/mL CAN BE SEEN IN:       -UNSTABLE ANGINA       -  CONGESTIVE HEART FAILURE       -MYOCARDITIS       -CHEST TRAUMA       -ARRYHTHMIAS       -LATE PRESENTING MYOCARDIAL INFARCTION       -COPD   CLINICAL FOLLOW-UP RECOMMENDED.   Digoxin level     Status: Abnormal   Collection Time: 02/19/16  3:44 PM  Result Value Ref Range   Digoxin Level 0.3 (L) 0.8 - 2.0 ng/mL  Urinalysis, Routine w reflex microscopic     Status: None    Collection Time: 02/19/16  5:07 PM  Result Value Ref Range   Color, Urine YELLOW YELLOW   APPearance CLEAR CLEAR   Specific Gravity, Urine 1.009 1.005 - 1.030   pH 6.5 5.0 - 8.0   Glucose, UA NEGATIVE NEGATIVE mg/dL   Hgb urine dipstick NEGATIVE NEGATIVE   Bilirubin Urine NEGATIVE NEGATIVE   Ketones, ur NEGATIVE NEGATIVE mg/dL   Protein, ur NEGATIVE NEGATIVE mg/dL   Nitrite NEGATIVE NEGATIVE   Leukocytes, UA NEGATIVE NEGATIVE    Comment: MICROSCOPIC NOT DONE ON URINES WITH NEGATIVE PROTEIN, BLOOD, LEUKOCYTES, NITRITE, OR GLUCOSE <1000 mg/dL.  Troponin I     Status: Abnormal   Collection Time: 02/20/16  2:24 AM  Result Value Ref Range   Troponin I 0.06 (H) <0.031 ng/mL    Comment:        PERSISTENTLY INCREASED TROPONIN VALUES IN THE RANGE OF 0.04-0.49 ng/mL CAN BE SEEN IN:       -UNSTABLE ANGINA       -CONGESTIVE HEART FAILURE       -MYOCARDITIS       -CHEST TRAUMA       -ARRYHTHMIAS       -LATE PRESENTING MYOCARDIAL INFARCTION       -COPD   CLINICAL FOLLOW-UP RECOMMENDED.   Magnesium     Status: None   Collection Time: 02/20/16  5:28 AM  Result Value Ref Range   Magnesium 2.0 1.7 - 2.4 mg/dL  Phosphorus     Status: None   Collection Time: 02/20/16  5:28 AM  Result Value Ref Range   Phosphorus 3.3 2.5 - 4.6 mg/dL  TSH     Status: None   Collection Time: 02/20/16  5:28 AM  Result Value Ref Range   TSH 1.306 0.350 - 4.500 uIU/mL  Comprehensive metabolic panel     Status: Abnormal   Collection Time: 02/20/16  5:28 AM  Result Value Ref Range   Sodium 135 135 - 145 mmol/L    Comment: DELTA CHECK NOTED   Potassium 3.8 3.5 - 5.1 mmol/L   Chloride 99 (L) 101 - 111 mmol/L   CO2 27 22 - 32 mmol/L   Glucose, Bld 108 (H) 65 - 99 mg/dL   BUN 15 6 - 20 mg/dL   Creatinine, Ser 0.81 0.44 - 1.00 mg/dL   Calcium 9.3 8.9 - 10.3 mg/dL   Total Protein 6.9 6.5 - 8.1 g/dL   Albumin 4.1 3.5 - 5.0 g/dL   AST 31 15 - 41 U/L   ALT 23 14 - 54 U/L   Alkaline Phosphatase 73 38 - 126 U/L    Total Bilirubin 1.3 (H) 0.3 - 1.2 mg/dL   GFR calc non Af Amer 60 (L) >60 mL/min   GFR calc Af Amer >60 >60 mL/min    Comment: (NOTE) The eGFR has been calculated using the CKD EPI equation. This calculation has not been validated in all clinical situations. eGFR's persistently <60  mL/min signify possible Chronic Kidney Disease.    Anion gap 9 5 - 15  CBC     Status: Abnormal   Collection Time: 02/20/16  5:28 AM  Result Value Ref Range   WBC 10.7 (H) 4.0 - 10.5 K/uL   RBC 4.42 3.87 - 5.11 MIL/uL   Hemoglobin 12.6 12.0 - 15.0 g/dL   HCT 37.7 36.0 - 46.0 %   MCV 85.3 78.0 - 100.0 fL   MCH 28.5 26.0 - 34.0 pg   MCHC 33.4 30.0 - 36.0 g/dL   RDW 13.2 11.5 - 15.5 %   Platelets 191 150 - 400 K/uL  Troponin I     Status: Abnormal   Collection Time: 02/20/16  7:57 AM  Result Value Ref Range   Troponin I 0.05 (H) <0.031 ng/mL    Comment:        PERSISTENTLY INCREASED TROPONIN VALUES IN THE RANGE OF 0.04-0.49 ng/mL CAN BE SEEN IN:       -UNSTABLE ANGINA       -CONGESTIVE HEART FAILURE       -MYOCARDITIS       -CHEST TRAUMA       -ARRYHTHMIAS       -LATE PRESENTING MYOCARDIAL INFARCTION       -COPD   CLINICAL FOLLOW-UP RECOMMENDED.     Current Facility-Administered Medications  Medication Dose Route Frequency Provider Last Rate Last Dose  . 0.9 %  sodium chloride infusion   Intravenous Continuous Toy Baker, MD      . acetaminophen (TYLENOL) tablet 650 mg  650 mg Oral Q6H PRN Toy Baker, MD       Or  . acetaminophen (TYLENOL) suppository 650 mg  650 mg Rectal Q6H PRN Toy Baker, MD      . aspirin EC tablet 81 mg  81 mg Oral Daily Toy Baker, MD      . atenolol (TENORMIN) tablet 12.5 mg  12.5 mg Oral Daily Toy Baker, MD      . digoxin (LANOXIN) tablet 125 mcg  125 mcg Oral Daily Toy Baker, MD      . enoxaparin (LOVENOX) injection 40 mg  40 mg Subcutaneous Daily Toy Baker, MD      . HYDROcodone-acetaminophen  (NORCO/VICODIN) 5-325 MG per tablet 1-2 tablet  1-2 tablet Oral Q4H PRN Toy Baker, MD      . insulin aspart (novoLOG) injection 0-5 Units  0-5 Units Subcutaneous QHS Toy Baker, MD      . insulin aspart (novoLOG) injection 0-9 Units  0-9 Units Subcutaneous TID WC Toy Baker, MD      . isosorbide mononitrate (IMDUR) 24 hr tablet 30 mg  30 mg Oral Daily Toy Baker, MD      . levothyroxine (SYNTHROID, LEVOTHROID) tablet 100 mcg  100 mcg Oral QAC breakfast Toy Baker, MD      . losartan (COZAAR) tablet 25 mg  25 mg Oral Daily Toy Baker, MD      . ondansetron (ZOFRAN) tablet 4 mg  4 mg Oral Q6H PRN Toy Baker, MD       Or  . ondansetron (ZOFRAN) injection 4 mg  4 mg Intravenous Q6H PRN Toy Baker, MD      . pantoprazole (PROTONIX) EC tablet 40 mg  40 mg Oral Daily Toy Baker, MD      . simvastatin (ZOCOR) tablet 40 mg  40 mg Oral q1800 Anastassia Doutova, MD      . sodium chloride flush (NS) 0.9 % injection 3 mL  3 mL Intravenous Q12H Toy Baker, MD        Musculoskeletal: Strength & Muscle Tone: decreased Gait & Station: unable to stand Patient leans: N/A  Psychiatric Specialty Exam: Physical Exam as per history and physical   ROS generalized weakness, tired, does not walk alone any longer and feels like needed walker. Patient is anxious and worried about medication making her too sedated. No Fever-chills, No Headache, No changes with Vision or hearing, reports vertigo No problems swallowing food or Liquids, No Chest pain, Cough or Shortness of Breath, No Abdominal pain, No Nausea or Vommitting, Bowel movements are regular, No Blood in stool or Urine, No dysuria, No new skin rashes or bruises, No new joints pains-aches,  No new weakness, tingling, numbness in any extremity, No recent weight gain or loss, No polyuria, polydypsia or polyphagia,   A full 10 point Review of Systems was done, except as  stated above, all other Review of Systems were negative.  Blood pressure 143/50, pulse 88, temperature 97.7 F (36.5 C), temperature source Oral, resp. rate 18, height _0  (1.6 m), weight 53.87 kg (118 lb 12.2 oz), SpO2 95 %.Body mass index is 21.04 kg/(m^2).  General Appearance: Casual  Eye Contact:  Good  Speech:  Slow  Volume:  Decreased  Mood:  Anxious and Depressed  Affect:  Constricted and Depressed  Thought Process:  Coherent  Orientation:  Full (Time, Place, and Person)  Thought Content:  Delusions and Paranoid Ideation  Suicidal Thoughts:  No  Homicidal Thoughts:  No  Memory:  Immediate;   Fair Recent;   Fair  Judgement:  Impaired  Insight:  Shallow  Psychomotor Activity:  Decreased  Concentration:  Concentration: Fair and Attention Span: Fair  Recall:  [employer] of Knowledge:  Fair  Language:  Fair  Akathisia:  Negative  Handed:  Right  AIMS (if indicated):     Assets:  Communication Skills Desire for Improvement Financial Resources/Insurance Housing Leisure Time Resilience Social Support Transportation  ADL's:  Impaired  Cognition:  Impaired,  Mild  Sleep:        Treatment Plan Summary: Patient presented with acute onset of paranoid delusions feelings of food has been poisoned and reportedly drinking but being dehydrated and could not keep food and drink. Patient has multiple's medical problems including hypothyroidism, transient ischemic attacks, paroxysmal atrial fibrillation, coronary artery disease, hyponatremia etc.   Patient will benefit from smaller dose of antipsychotic medication Zyprexa 2.5 mg at bedtime which will help her controlling paranoid delusions and improving her appetite.  Appreciate psychiatric consultation and follow up as clinically required Please contact [phone] or [phone] if needs further assistance  Disposition: Patient does not meet criteria for psychiatric inpatient admission. Supportive therapy provided about ongoing  stressors.  Ambrose Finland, MD 02/20/2016 9:34 AM

## 2016-02-20 NOTE — Progress Notes (Signed)
   02/20/16 1100  Clinical Encounter Type  Visited With Patient and family together  Visit Type Initial  Referral From Chaplain  Consult/Referral To Chaplain  Spiritual Encounters  Spiritual Needs Emotional  CHP visited with patient and son.  Patient spoke about multiple hospitalizations and death of spouse in 1999/12/14. CHP provided supportive, active listening and reflecting. CHP available for follow-up as needed. Roe Coombs 02/20/2016

## 2016-02-21 ENCOUNTER — Inpatient Hospital Stay (HOSPITAL_COMMUNITY): Payer: Medicare Other

## 2016-02-21 DIAGNOSIS — G9340 Encephalopathy, unspecified: Secondary | ICD-10-CM

## 2016-02-21 DIAGNOSIS — F0390 Unspecified dementia without behavioral disturbance: Secondary | ICD-10-CM

## 2016-02-21 DIAGNOSIS — I5032 Chronic diastolic (congestive) heart failure: Secondary | ICD-10-CM

## 2016-02-21 LAB — ECHOCARDIOGRAM COMPLETE
EERAT: 15.89
EWDT: 289 ms
FS: 25 % — AB (ref 28–44)
HEIGHTINCHES: 63 in
IVS/LV PW RATIO, ED: 1.01
LA vol A4C: 89.2 ml
LA vol index: 63.8 mL/m2
LA vol: 98.9 mL
LADIAMINDEX: 2.65 cm/m2
LASIZE: 41 mm
LEFT ATRIUM END SYS DIAM: 41 mm
LV e' LATERAL: 6.42 cm/s
LVEEAVG: 15.89
LVEEMED: 15.89
LVOT area: 2.84 cm2
LVOT diameter: 19 mm
MV Dec: 289
MV pk A vel: 135 m/s
MV pk E vel: 102 m/s
MVPG: 4 mmHg
PW: 14.5 mm — AB (ref 0.6–1.1)
Reg peak vel: 262 cm/s
TDI e' lateral: 6.42
TDI e' medial: 5.22
TR max vel: 262 cm/s
WEIGHTICAEL: 1907.2 [oz_av]

## 2016-02-21 LAB — BASIC METABOLIC PANEL
Anion gap: 8 (ref 5–15)
BUN: 27 mg/dL — AB (ref 6–20)
CALCIUM: 8.4 mg/dL — AB (ref 8.9–10.3)
CO2: 25 mmol/L (ref 22–32)
CREATININE: 1.04 mg/dL — AB (ref 0.44–1.00)
Chloride: 101 mmol/L (ref 101–111)
GFR calc non Af Amer: 44 mL/min — ABNORMAL LOW (ref >60)
GFR, EST AFRICAN AMERICAN: 51 mL/min — AB (ref >60)
Glucose, Bld: 106 mg/dL — ABNORMAL HIGH (ref 65–99)
Potassium: 3.7 mmol/L (ref 3.5–5.1)
SODIUM: 134 mmol/L — AB (ref 135–145)

## 2016-02-21 LAB — HEMOGLOBIN A1C
HEMOGLOBIN A1C: 5.3 % (ref 4.8–5.6)
Mean Plasma Glucose: 105 mg/dL

## 2016-02-21 LAB — GLUCOSE, CAPILLARY
GLUCOSE-CAPILLARY: 113 mg/dL — AB (ref 65–99)
GLUCOSE-CAPILLARY: 110 mg/dL — AB (ref 65–99)

## 2016-02-21 LAB — OSMOLALITY, URINE: OSMOLALITY UR: 579 mosm/kg (ref 300–900)

## 2016-02-21 LAB — CREATININE, URINE, RANDOM: Creatinine, Urine: 145.14 mg/dL

## 2016-02-21 LAB — SODIUM, URINE, RANDOM: Sodium, Ur: 24 mmol/L

## 2016-02-21 MED ORDER — ENOXAPARIN SODIUM 30 MG/0.3ML ~~LOC~~ SOLN: 30.0000 mg | Freq: Every day | SUBCUTANEOUS | Status: DC

## 2016-02-21 MED ORDER — OLANZAPINE 2.5 MG PO TABS
2.5000 mg | ORAL_TABLET | Freq: Every day | ORAL | Status: DC
Start: 2016-02-21 — End: 2016-04-22

## 2016-02-21 NOTE — Discharge Summary (Signed)
Discharge Summary  Jill Howell [id number] DOB: [birth year]  PCP: Elsie Stain, MD  Admit date: 02/19/2016 Discharge date: 02/21/2016   Recommendations for Outpatient Follow-up:  1. F/u with cardiology in 1-2 weeks regarding diuretic use. 2. F/u with PCP in 1-2 weeks.   Discharge Diagnoses:  Active Hospital Problems   Diagnosis Date Noted  . Dementia 02/19/2016  . Pressure ulcer 02/20/2016  . Other affective psychosis   . Psychotic disorder with delusions   . CAD (coronary artery disease) 02/19/2016  . Hyponatremia 02/19/2016  . Acute encephalopathy 02/19/2016  . Diastolic CHF, chronic ([place]) 02/19/2016  . Dehydration 02/19/2016  . PAF (paroxysmal atrial fibrillation) ([place]) 08/11/2012    Resolved Hospital Problems   Diagnosis Date Noted Date Resolved  No resolved problems to display.    Discharge Condition: Stable   Diet recommendation: Diabetic heart healthy, with thin liquids. Fluid restrict 2500 cc per day.   Filed Vitals:   02/21/16 0357 02/21/16 1143  BP: 139/59 121/51  Pulse: 71 74  Temp: 97.9 F (36.6 C) 97.8 F (36.6 C)  Resp: 18 18    History of present illness:  Patient is a 82 y.o. female dementia, chronic diastolic heart failure, CAD status post CABG who was brought in by family for evaluation of her weakness. Found to have hyponatremia and admitted for further evaluation and treatment  Subjective: She is awake and alert this morning, no indication of any confusion at all, she is feeling back to normal.  Assessment/Plan: Hyponatremia: Probably secondary to dehydration and excessive free water intake. Resolved with IV fluids, is currently clinically euvolemic on exam-stop all IV fluids and monitor. Encouraged oral intake. Have advised against excessive free water intake, Na was checked this PM is 134 and she is stable for discharge.  Acute encephalopathy: CT head negative for acute abnormalities, suspect mild acute encephalopathy due to dehydration  and hyponatremia-it seems that she is significantly better and is close to her usual baseline. UA and chest x-ray negative for infection.  Chronic diastolic heart failure: Clinically compensated, likely euvolemic-suspect we could hold off on starting diuretics at this time.  Minimally elevated troponins: Trend is flat not consistent with ACS.   History of paroxysmal atrial fibrillation: Rate controlled with atenolol, digoxin-suspect not a anticoagulation candidate given dementia, frailty and fall risk.CHADS2VASC for at least 5  CAD-status post CABG: Chest pain free, no shortness of breath. Continue aspirin, beta blocker and statin  Dementia with history of paranoid delirium: Currently seems pleasant, and does not appear to have any paranoia. Await psychiatry evaluation-call by admitting M.D.  Hypertension: Controlled, continue atenolol, losartan and Imdur  Hypothyroidism: Continue levothyroxine  Procedures:  Echo 6/14   Consultations:  None   Discharge Exam: BP 121/51 mmHg  Pulse 74  Temp(Src) 97.8 F (36.6 C) (Oral)  Resp 18  Ht 5\' 3"  (1.6 m)  Wt 54.069 kg (119 lb 3.2 oz)  BMI 21.12 kg/m2  SpO2 98% General:  Alert, oriented, calm, in no acute distress  Eyes: pupils round and reactive to light and accomodation, clear sclerea Neck: supple, no masses, trachea mildline  Cardiovascular: RRR, no murmurs or rubs, no peripheral edema  Respiratory: clear to auscultation bilaterally, no wheezes, no crackles  Abdomen: soft, nontender, nondistended, normal bowel tones heard  Skin: dry, no rashes  Musculoskeletal: no joint effusions, normal range of motion  Psychiatric: appropriate affect, normal speech  Neurologic: extraocular muscles intact, clear speech, moving all extremities with intact sensorium    Discharge Instructions You were  cared for by a hospitalist during your hospital stay. If you have any questions about your discharge medications or the care you received while you  were in the hospital after you are discharged, you can call the unit and asked to speak with the hospitalist on call if the hospitalist that took care of you is not available. Once you are discharged, your primary care physician will handle any further medical issues. Please note that NO REFILLS for any discharge medications will be authorized once you are discharged, as it is imperative that you return to your primary care physician (or establish a relationship with a primary care physician if you do not have one) for your aftercare needs so that they can reassess your need for medications and monitor your lab values.  Discharge Instructions    Diet - low sodium heart healthy    Complete by:  As directed      Increase activity slowly    Complete by:  As directed             Medication List    STOP taking these medications        furosemide 20 MG tablet  Commonly known as:  LASIX      TAKE these medications        aspirin 81 MG EC tablet  Take 81 mg by mouth daily.     atenolol 25 MG tablet  Commonly known as:  TENORMIN  Take 0.5 tablets (12.5 mg total) by mouth daily.     BIOTIN PO  Take 1 capsule by mouth daily.     digoxin 0.125 MG tablet  Commonly known as:  DIGOX  Take 1 tablet (125 mcg total) by mouth daily.     isosorbide mononitrate 30 MG 24 hr tablet  Commonly known as:  IMDUR  TAKE 1 TABLET (30 MG TOTAL) BY MOUTH DAILY.     levothyroxine 100 MCG tablet  Commonly known as:  SYNTHROID, LEVOTHROID  TAKE 1 TABLET (100 MCG TOTAL) BY MOUTH DAILY.     losartan 25 MG tablet  Commonly known as:  COZAAR  Take 1 tablet (25 mg total) by mouth daily.     nitroGLYCERIN 0.4 MG SL tablet  Commonly known as:  NITROSTAT  PLACE 1 TABLET UNDER THE TONGUE EVERY 5 MINUTES AS NEEDED FOR CHEST PAIN.     OLANZapine 2.5 MG tablet  Commonly known as:  ZYPREXA  Take 1 tablet (2.5 mg total) by mouth at bedtime.     pantoprazole 40 MG tablet  Commonly known as:  PROTONIX  Take 1  tablet (40 mg total) by mouth daily.     PRESERVISION AREDS PO  Take 1 tablet by mouth daily.     simvastatin 40 MG tablet  Commonly known as:  ZOCOR  TAKE 1 TABLET (40 MG TOTAL) BY MOUTH EVERY EVENING.     vitamin B-12 1000 MCG tablet  Commonly known as:  CYANOCOBALAMIN  Take 1,000 mcg by mouth daily.       Allergies  Allergen Reactions  . Furosemide Other (See Comments)    Patient prefers not to take med, weakness and frequent urination  . Penicillins Other (See Comments)    unknown       Follow-up Information    Follow up with [facility].   Why:  They will do your home health care at your home   Contact information:   [address] [place]  [address] [phone]  Follow up with Elsie Stain, MD On 03/04/2016.   Specialty:  Family Medicine   Why:  At 4:00pm ... for hospital follow up   Contact information:   [place] [address] [phone]        The results of significant diagnostics from this hospitalization (including imaging, microbiology, ancillary and laboratory) are listed below for reference.    Significant Diagnostic Studies: Dg Chest 2 View  02/19/2016  CLINICAL DATA:  Confusion.  Weakness. EXAM: CHEST  2 VIEW COMPARISON:  04/04/2015 chest radiograph. FINDINGS: Sternotomy wires appear aligned and intact. CABG clips overlie the mediastinum. Soft tissue anchors overlie the bilateral humeral heads. Stable cardiomediastinal silhouette with mild cardiomegaly. No pneumothorax. Slight blunting of the costophrenic angles posteriorly could indicate trace bilateral pleural effusions. No overt pulmonary edema. No acute consolidative airspace disease. Lungs appear hyperinflated. IMPRESSION: 1. Stable mild cardiomegaly without overt pulmonary edema. 2. Slight blunting of the posterior costophrenic angles bilaterally, which suggests trace bilateral pleural effusions. 3. No acute pulmonary disease. Lungs appear  hyperinflated, unchanged, suggesting obstructive lung disease. Electronically Signed   By: Ilona Sorrel M.D.   On: 02/19/2016 16:29   Ct Head Wo Contrast  02/19/2016  CLINICAL DATA:  Weakness. Bilateral lower extremity pain. Confusion. EXAM: CT HEAD WITHOUT CONTRAST TECHNIQUE: Contiguous axial images were obtained from the base of the skull through the vertex without intravenous contrast. COMPARISON:  01/27/2009 head CT. FINDINGS: No evidence of parenchymal hemorrhage or extra-axial fluid collection. No mass lesion, mass effect, or midline shift. No CT evidence of acute infarction. Intracranial atherosclerosis. Nonspecific mild to moderate subcortical and periventricular white matter hypodensity, most in keeping with chronic small vessel ischemic change. Cerebral volume is age appropriate. No ventriculomegaly. Minimal opacification of the ethmoidal air cells. No fluid levels in the visualized paranasal sinuses. The mastoid air cells are unopacified. No evidence of calvarial fracture. IMPRESSION: 1.  No evidence of acute intracranial abnormality. 2. Mild-to-moderate chronic small vessel ischemia. Electronically Signed   By: Ilona Sorrel M.D.   On: 02/19/2016 16:15    Microbiology: No results found for this or any previous visit (from the past 240 hour(s)).   Labs: Basic Metabolic Panel:  Recent Labs Lab 02/19/16 1225 02/20/16 0528  NA 126* 135  K 3.8 3.8  CL 90* 99*  CO2 24 27  GLUCOSE 111* 108*  BUN 19 15  CREATININE 0.97 0.81  CALCIUM 9.7 9.3  MG  --  2.0  PHOS  --  3.3   Liver Function Tests:  Recent Labs Lab 02/19/16 1225 02/20/16 0528  AST 28 31  ALT 23 23  ALKPHOS 76 73  BILITOT 1.2 1.3*  PROT 7.5 6.9  ALBUMIN 4.4 4.1   No results for input(s): LIPASE, AMYLASE in the last 168 hours. No results for input(s): AMMONIA in the last 168 hours. CBC:  Recent Labs Lab 02/19/16 1544 02/20/16 0528  WBC 10.9* 10.7*  NEUTROABS 5.9  --   HGB 13.0 12.6  HCT 38.4 37.7  MCV  85.3 85.3  PLT 189 191   Cardiac Enzymes:  Recent Labs Lab 02/19/16 1544 02/20/16 0224 02/20/16 0757 02/20/16 1458  TROPONINI 0.05* 0.06* 0.05* 0.04*   BNP: BNP (last 3 results) No results for input(s): BNP in the last 8760 hours.  ProBNP (last 3 results) No results for input(s): PROBNP in the last 8760 hours.  CBG:  Recent Labs Lab 02/20/16 1137 02/20/16 1628 02/20/16 2101 02/21/16 0536 02/21/16 1142  GLUCAP 105* 124* 155*  113* 110*    Time spent: 35 minutes were spent in preparing this discharge including medication reconciliation, counseling, and coordination of care.  Signed:  [employer]  [facility] 02/21/2016, 1:08 PM

## 2016-02-21 NOTE — Progress Notes (Signed)
  Echocardiogram 2D Echocardiogram has been performed.  Diamond Nickel 02/21/2016, 10:47 AM

## 2016-02-21 NOTE — Progress Notes (Signed)
Orders received for pt discharge.  Discharge summary printed and reviewed with pt.  Explained medication regimen, and pt had no further questions at this time.  IV removed and site remains clean, dry, intact.  Telemetry removed.  Pt in stable condition and awaiting transport. 

## 2016-02-21 NOTE — Care Management Important Message (Signed)
Important Message  Patient Details  Name: Jill Howell MRN: [id number] Date of Birth: 19-Dec-1918   Medicare Important Message Given:  Yes    [name] 02/21/2016, 8:08 AM

## 2016-02-21 NOTE — Care Management Note (Signed)
Case Management Note  Patient Details  Name: Jill Howell MRN: [id number] Date of Birth: 1919/05/03              Action/Plan: CM talked to patient with her son Lenny Pastel present; pt lives alone with her son [name] next door [phone]); [place] choice offered, pt/ son chose [place]; Butch Penny with [facility] notified of referral. Lenny Pastel stated that her son [name] will be able to keep a close eye on her and will stay with her tonight. She has a walker and 5 canes at home,no other DME is needed at this time.  Expected Discharge Date:      02/21/2016            Expected Discharge Plan:  [place]  Discharge planning Services  CM Consult  Choice offered to:  Patient, Adult Children  HH Arranged:  PT, OT, Nurse's Aide [place] Agency:  [place]  Status of Service:  In process, will continue to follow  Medicare Important Message Given:  Yes  Sherrilyn Rist [phone] 02/21/2016, 11:19 AM

## 2016-02-21 NOTE — Progress Notes (Signed)
MEDICATION RELATED CONSULT NOTE - INITIAL   Pharmacy Consult:  [place] adjust VTE prophylaxis Lovenox dose Indication: VTE prophylaxis  Allergies  Allergen Reactions  . Furosemide Other (See Comments)    Patient prefers not to take med, weakness and frequent urination  . Penicillins Other (See Comments)    unknown    Patient Measurements: Height: 5\' 3"  (160 cm) Weight: 119 lb 3.2 oz (54.069 kg) (c scale) IBW/kg (Calculated) : 52.4   Vital Signs: Temp: 97.8 F (36.6 C) (06/14 1143) Temp Source: Oral (06/14 1143) BP: 121/51 mmHg (06/14 1143) Pulse Rate: 74 (06/14 1143) Intake/Output from previous day: 06/13 0701 - 06/14 0700 In: 1190 [P.O.:1190] Out: 350 [Urine:350] Intake/Output from this shift: Total I/O In: 535 [P.O.:535] Out: -   Labs:  Recent Labs  02/19/16 1225 02/19/16 1544 02/20/16 0528 02/21/16 0350 02/21/16 1328  WBC  --  10.9* 10.7*  --   --   HGB  --  13.0 12.6  --   --   HCT  --  38.4 37.7  --   --   PLT  --  189 191  --   --   CREATININE 0.97  --  0.81  --  1.04*  LABCREA  --   --   --  145.14  --   MG  --   --  2.0  --   --   PHOS  --   --  3.3  --   --   ALBUMIN 4.4  --  4.1  --   --   PROT 7.5  --  6.9  --   --   AST 28  --  31  --   --   ALT 23  --  23  --   --   ALKPHOS 76  --  73  --   --   BILITOT 1.2  --  1.3*  --   --    Estimated Creatinine Clearance: 26.8 mL/min (by C-G formula based on Cr of 1.04).   Microbiology: No results found for this or any previous visit (from the past 720 hour(s)).  Medical History: Past Medical History  Diagnosis Date  . Unspecified hearing loss   . Hypothyroidism   . Hyperlipidemia   . Esophageal reflux   . Type II or unspecified type diabetes mellitus without mention of complication, not stated as uncontrolled   . CAD (coronary artery disease)     a. 2000 s/p MI/CABG x 4 (VG-PDA, VG-OM, VG-Diag, LIMA-LAD);  b. 2011 MI;  c. 08/2013 NSTEMI/Cath: LM 90-95, LAD 90/100p, LCX 99p, 171m, RCA 100ost,  VG-PDA nl, VG-OM 100 (culprit w/ thrombus), VG-Diag nl, LIMA-LAD nl, EF 55-60%, mild inf HK, mod MR-->Med Rx.  . Chronic diastolic CHF (congestive heart failure) ([place])     a. 08/2013 Echo: EF 50-55%, no rwma, Gr2 DD, mod MR, mildly dil LA, PASP 85mmHg.  . Atrial fibrillation ([place])     a. remote->no anticoagulation 2/2 h/o falls.  . Asthma   . Allergic rhinitis, cause unspecified   . Hypertension   . History of TIA (transient ischemic attack)   . Carotid arterial disease ([place])     a. 05/2013 s/p L CEA.  [name] Anemia   . Myocardial infarction ([place])     08-2013    Medications:  Scheduled:  . aspirin EC  81 mg Oral Daily  . atenolol  12.5 mg Oral Daily  . digoxin  125 mcg Oral Daily  . enoxaparin (LOVENOX) injection  30 mg Subcutaneous Daily  . insulin aspart  0-5 Units Subcutaneous QHS  . insulin aspart  0-9 Units Subcutaneous TID WC  . isosorbide mononitrate  30 mg Oral Daily  . levothyroxine  100 mcg Oral QAC breakfast  . losartan  25 mg Oral Daily  . OLANZapine  2.5 mg Oral QHS  . pantoprazole  40 mg Oral Daily  . simvastatin  40 mg Oral q1800  . sodium chloride flush  3 mL Intravenous Q12H    Assessment/Plan:  82 y.o female presented on 02/19/16 for evaluation of her weakness. Found to have hyponatremia.  VTE prophylaxis with Lovenox 40mg  SQ daily ordered- Pharmacy [facility] adjust dose.  SCr 1.04 today, estimated CrCl is 46ml/min.  Pharmacy has adjusted the lovenox SQ dose to 30mg  daily.   Thank you for allowing pharmacy to be part of this patients care team. Nicole Cella, RPh Clinical Pharmacist Pager: [phone] 02/21/2016,2:30 PM

## 2016-02-22 ENCOUNTER — Telehealth: Payer: Self-pay
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
## 2016-02-26 ENCOUNTER — Other Ambulatory Visit: Payer: Self-pay

## 2016-02-26 DIAGNOSIS — F0390 Unspecified dementia without behavioral disturbance: Secondary | ICD-10-CM | POA: Diagnosis not present

## 2016-02-26 DIAGNOSIS — I2510 Atherosclerotic heart disease of native coronary artery without angina pectoris: Secondary | ICD-10-CM | POA: Diagnosis not present

## 2016-02-26 DIAGNOSIS — I110 Hypertensive heart disease with heart failure: Secondary | ICD-10-CM | POA: Diagnosis not present

## 2016-02-26 DIAGNOSIS — R531 Weakness: Secondary | ICD-10-CM | POA: Diagnosis not present

## 2016-02-26 DIAGNOSIS — F05 Delirium due to known physiological condition: Secondary | ICD-10-CM | POA: Diagnosis not present

## 2016-02-26 DIAGNOSIS — I5032 Chronic diastolic (congestive) heart failure: Secondary | ICD-10-CM | POA: Diagnosis not present

## 2016-02-27 DIAGNOSIS — H401132 Primary open-angle glaucoma, bilateral, moderate stage: Secondary | ICD-10-CM | POA: Diagnosis not present

## 2016-02-27 LAB — HM DIABETES EYE EXAM

## 2016-02-28 DIAGNOSIS — I5032 Chronic diastolic (congestive) heart failure: Secondary | ICD-10-CM | POA: Diagnosis not present

## 2016-02-28 DIAGNOSIS — F05 Delirium due to known physiological condition: Secondary | ICD-10-CM | POA: Diagnosis not present

## 2016-02-28 DIAGNOSIS — I110 Hypertensive heart disease with heart failure: Secondary | ICD-10-CM | POA: Diagnosis not present

## 2016-02-28 DIAGNOSIS — I2510 Atherosclerotic heart disease of native coronary artery without angina pectoris: Secondary | ICD-10-CM | POA: Diagnosis not present

## 2016-02-28 DIAGNOSIS — R531 Weakness: Secondary | ICD-10-CM | POA: Diagnosis not present

## 2016-02-28 DIAGNOSIS — F0390 Unspecified dementia without behavioral disturbance: Secondary | ICD-10-CM | POA: Diagnosis not present

## 2016-03-01 DIAGNOSIS — R531 Weakness: Secondary | ICD-10-CM | POA: Diagnosis not present

## 2016-03-01 DIAGNOSIS — F0390 Unspecified dementia without behavioral disturbance: Secondary | ICD-10-CM | POA: Diagnosis not present

## 2016-03-01 DIAGNOSIS — F05 Delirium due to known physiological condition: Secondary | ICD-10-CM | POA: Diagnosis not present

## 2016-03-01 DIAGNOSIS — I2510 Atherosclerotic heart disease of native coronary artery without angina pectoris: Secondary | ICD-10-CM | POA: Diagnosis not present

## 2016-03-01 DIAGNOSIS — I5032 Chronic diastolic (congestive) heart failure: Secondary | ICD-10-CM | POA: Diagnosis not present

## 2016-03-01 DIAGNOSIS — I110 Hypertensive heart disease with heart failure: Secondary | ICD-10-CM | POA: Diagnosis not present

## 2016-03-04 ENCOUNTER — Encounter: Payer: Self-pay | Admitting: Family Medicine

## 2016-03-04 ENCOUNTER — Ambulatory Visit (INDEPENDENT_AMBULATORY_CARE_PROVIDER_SITE_OTHER): Payer: Medicare Other | Admitting: Family Medicine

## 2016-03-04 ENCOUNTER — Telehealth: Payer: Self-pay | Admitting: Family Medicine

## 2016-03-04 VITALS — BP 148/52 | HR 68 | Temp 97.7°F | Wt 125.5 lb

## 2016-03-04 DIAGNOSIS — R531 Weakness: Secondary | ICD-10-CM | POA: Diagnosis not present

## 2016-03-04 DIAGNOSIS — W19XXXA Unspecified fall, initial encounter: Secondary | ICD-10-CM | POA: Diagnosis not present

## 2016-03-04 DIAGNOSIS — Y92099 Unspecified place in other non-institutional residence as the place of occurrence of the external cause: Secondary | ICD-10-CM | POA: Diagnosis not present

## 2016-03-04 DIAGNOSIS — F05 Delirium due to known physiological condition: Secondary | ICD-10-CM | POA: Diagnosis not present

## 2016-03-04 DIAGNOSIS — Z638 Other specified problems related to primary support group: Secondary | ICD-10-CM

## 2016-03-04 DIAGNOSIS — E871 Hypo-osmolality and hyponatremia: Secondary | ICD-10-CM

## 2016-03-04 DIAGNOSIS — Y92009 Unspecified place in unspecified non-institutional (private) residence as the place of occurrence of the external cause: Secondary | ICD-10-CM

## 2016-03-04 DIAGNOSIS — I200 Unstable angina: Secondary | ICD-10-CM | POA: Diagnosis not present

## 2016-03-04 DIAGNOSIS — I2510 Atherosclerotic heart disease of native coronary artery without angina pectoris: Secondary | ICD-10-CM | POA: Diagnosis not present

## 2016-03-04 DIAGNOSIS — I5032 Chronic diastolic (congestive) heart failure: Secondary | ICD-10-CM | POA: Diagnosis not present

## 2016-03-04 DIAGNOSIS — Z639 Problem related to primary support group, unspecified: Secondary | ICD-10-CM | POA: Diagnosis not present

## 2016-03-04 DIAGNOSIS — I110 Hypertensive heart disease with heart failure: Secondary | ICD-10-CM | POA: Diagnosis not present

## 2016-03-04 DIAGNOSIS — F0390 Unspecified dementia without behavioral disturbance: Secondary | ICD-10-CM | POA: Diagnosis not present
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
## 2016-03-04 NOTE — Assessment & Plan Note (Signed)
Will ask for family input.  Has fall alert button.
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
## 2016-03-04 NOTE — Patient Instructions (Signed)
Don't change your meds for now.  Go to the lab on the way out.  We'll contact you with your lab report. Take care.  Glad to see you.  

## 2016-03-05 ENCOUNTER — Encounter: Payer: Self-pay | Admitting: Family Medicine

## 2016-03-05 LAB — BASIC METABOLIC PANEL
BUN: 23 mg/dL (ref 6–23)
CHLORIDE: 91 meq/L — AB (ref 96–112)
CO2: 27 meq/L (ref 19–32)
Calcium: 9.7 mg/dL (ref 8.4–10.5)
Creatinine, Ser: 0.93 mg/dL (ref 0.40–1.20)
GFR: 59.51 mL/min — ABNORMAL LOW (ref >60.00)
GLUCOSE: 96 mg/dL (ref 70–99)
POTASSIUM: 4.8 meq/L (ref 3.5–5.1)
SODIUM: 123 meq/L — AB (ref 135–145)

## 2016-03-05 NOTE — Telephone Encounter (Signed)
[name] (son) returned your call - please call [phone] Thank you
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
## 2016-03-05 NOTE — Telephone Encounter (Signed)
Left message on patient's voicemail to return call

## 2016-03-06 DIAGNOSIS — I5032 Chronic diastolic (congestive) heart failure: Secondary | ICD-10-CM | POA: Diagnosis not present

## 2016-03-06 DIAGNOSIS — I2510 Atherosclerotic heart disease of native coronary artery without angina pectoris: Secondary | ICD-10-CM | POA: Diagnosis not present

## 2016-03-06 DIAGNOSIS — R531 Weakness: Secondary | ICD-10-CM | POA: Diagnosis not present

## 2016-03-06 DIAGNOSIS — I110 Hypertensive heart disease with heart failure: Secondary | ICD-10-CM | POA: Diagnosis not present

## 2016-03-06 DIAGNOSIS — F0390 Unspecified dementia without behavioral disturbance: Secondary | ICD-10-CM | POA: Diagnosis not present

## 2016-03-06 DIAGNOSIS — F05 Delirium due to known physiological condition: Secondary | ICD-10-CM | POA: Diagnosis not present

## 2016-03-06 NOTE — Telephone Encounter (Signed)
Duly noted.  I appreciate family input/help.  If patient is able to function at home with family help, is still able to make some of her own decisions, and isn't willing to make sig changes at this point, then it appears that we are obligated to follow along as is.  Please have family update me as needed.   Thanks.  Also see lab report.

## 2016-03-06 NOTE — Telephone Encounter (Signed)
Patient's son notified as instructed by telephone and verbalized understanding. Doreatha Massed that he needs to get his mom to sign a DPR to be put on file so that we can talk with him when he calls.   Called patient with lab results and she stated that Jill Howell is in charge and helps her and she will sign DPR next time she is in the office.

## 2016-03-07 ENCOUNTER — Telehealth: Payer: Self-pay | Admitting: Cardiovascular Disease

## 2016-03-07 DIAGNOSIS — F0390 Unspecified dementia without behavioral disturbance: Secondary | ICD-10-CM | POA: Diagnosis not present

## 2016-03-07 DIAGNOSIS — I5032 Chronic diastolic (congestive) heart failure: Secondary | ICD-10-CM | POA: Diagnosis not present

## 2016-03-07 DIAGNOSIS — I110 Hypertensive heart disease with heart failure: Secondary | ICD-10-CM | POA: Diagnosis not present

## 2016-03-07 DIAGNOSIS — R531 Weakness: Secondary | ICD-10-CM | POA: Diagnosis not present

## 2016-03-07 DIAGNOSIS — I2510 Atherosclerotic heart disease of native coronary artery without angina pectoris: Secondary | ICD-10-CM | POA: Diagnosis not present

## 2016-03-07 DIAGNOSIS — F05 Delirium due to known physiological condition: Secondary | ICD-10-CM | POA: Diagnosis not present
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
## 2016-03-07 NOTE — Telephone Encounter (Signed)
Pt states she thinks she is dehydrated. States she is weak. Pt states the hospital released her and she was still dehydrated. Pt claims they only fed 3 meals a day when she was in the hospital. Please call.

## 2016-03-08 DIAGNOSIS — R531 Weakness: Secondary | ICD-10-CM | POA: Diagnosis not present

## 2016-03-08 DIAGNOSIS — I2510 Atherosclerotic heart disease of native coronary artery without angina pectoris: Secondary | ICD-10-CM | POA: Diagnosis not present

## 2016-03-08 DIAGNOSIS — F0390 Unspecified dementia without behavioral disturbance: Secondary | ICD-10-CM | POA: Diagnosis not present

## 2016-03-08 DIAGNOSIS — I5032 Chronic diastolic (congestive) heart failure: Secondary | ICD-10-CM | POA: Diagnosis not present

## 2016-03-08 DIAGNOSIS — I110 Hypertensive heart disease with heart failure: Secondary | ICD-10-CM | POA: Diagnosis not present

## 2016-03-08 DIAGNOSIS — F05 Delirium due to known physiological condition: Secondary | ICD-10-CM | POA: Diagnosis not present

## 2016-03-11 ENCOUNTER — Telehealth: Payer: Self-pay

## 2016-03-11 NOTE — Telephone Encounter (Signed)
Order given to [place] with [facility].

## 2016-03-11 NOTE — Telephone Encounter (Signed)
Please give the order.  Thanks.   

## 2016-03-11 NOTE — Telephone Encounter (Signed)
Jill Howell OT with [facility] at [facility] left v/m requesting verbal orders for home health OT 1 x a week for 4 weeks.

## 2016-03-13 DIAGNOSIS — R531 Weakness: Secondary | ICD-10-CM | POA: Diagnosis not present

## 2016-03-13 DIAGNOSIS — F0390 Unspecified dementia without behavioral disturbance: Secondary | ICD-10-CM | POA: Diagnosis not present

## 2016-03-13 DIAGNOSIS — F05 Delirium due to known physiological condition: Secondary | ICD-10-CM | POA: Diagnosis not present

## 2016-03-13 DIAGNOSIS — I110 Hypertensive heart disease with heart failure: Secondary | ICD-10-CM | POA: Diagnosis not present

## 2016-03-13 DIAGNOSIS — I5032 Chronic diastolic (congestive) heart failure: Secondary | ICD-10-CM | POA: Diagnosis not present

## 2016-03-13 DIAGNOSIS — I2510 Atherosclerotic heart disease of native coronary artery without angina pectoris: Secondary | ICD-10-CM | POA: Diagnosis not present

## 2016-03-15 DIAGNOSIS — I5032 Chronic diastolic (congestive) heart failure: Secondary | ICD-10-CM | POA: Diagnosis not present

## 2016-03-15 DIAGNOSIS — F05 Delirium due to known physiological condition: Secondary | ICD-10-CM | POA: Diagnosis not present

## 2016-03-15 DIAGNOSIS — R531 Weakness: Secondary | ICD-10-CM | POA: Diagnosis not present

## 2016-03-15 DIAGNOSIS — F0390 Unspecified dementia without behavioral disturbance: Secondary | ICD-10-CM | POA: Diagnosis not present

## 2016-03-15 DIAGNOSIS — I2510 Atherosclerotic heart disease of native coronary artery without angina pectoris: Secondary | ICD-10-CM | POA: Diagnosis not present

## 2016-03-15 DIAGNOSIS — I110 Hypertensive heart disease with heart failure: Secondary | ICD-10-CM | POA: Diagnosis not present

## 2016-03-18 DIAGNOSIS — F05 Delirium due to known physiological condition: Secondary | ICD-10-CM | POA: Diagnosis not present

## 2016-03-18 DIAGNOSIS — I5032 Chronic diastolic (congestive) heart failure: Secondary | ICD-10-CM | POA: Diagnosis not present

## 2016-03-18 DIAGNOSIS — I2510 Atherosclerotic heart disease of native coronary artery without angina pectoris: Secondary | ICD-10-CM | POA: Diagnosis not present

## 2016-03-18 DIAGNOSIS — F0390 Unspecified dementia without behavioral disturbance: Secondary | ICD-10-CM | POA: Diagnosis not present

## 2016-03-18 DIAGNOSIS — I110 Hypertensive heart disease with heart failure: Secondary | ICD-10-CM | POA: Diagnosis not present

## 2016-03-18 DIAGNOSIS — R531 Weakness: Secondary | ICD-10-CM | POA: Diagnosis not present

## 2016-03-20 ENCOUNTER — Other Ambulatory Visit: Payer: Self-pay | Admitting: Cardiovascular Disease

## 2016-03-20 DIAGNOSIS — F05 Delirium due to known physiological condition: Secondary | ICD-10-CM | POA: Diagnosis not present

## 2016-03-20 DIAGNOSIS — I2510 Atherosclerotic heart disease of native coronary artery without angina pectoris: Secondary | ICD-10-CM | POA: Diagnosis not present

## 2016-03-20 DIAGNOSIS — F0390 Unspecified dementia without behavioral disturbance: Secondary | ICD-10-CM | POA: Diagnosis not present

## 2016-03-20 DIAGNOSIS — I5032 Chronic diastolic (congestive) heart failure: Secondary | ICD-10-CM | POA: Diagnosis not present

## 2016-03-20 DIAGNOSIS — I110 Hypertensive heart disease with heart failure: Secondary | ICD-10-CM | POA: Diagnosis not present

## 2016-03-20 DIAGNOSIS — R531 Weakness: Secondary | ICD-10-CM | POA: Diagnosis not present

## 2016-03-26 DIAGNOSIS — R531 Weakness: Secondary | ICD-10-CM | POA: Diagnosis not present

## 2016-03-26 DIAGNOSIS — I110 Hypertensive heart disease with heart failure: Secondary | ICD-10-CM | POA: Diagnosis not present

## 2016-03-26 DIAGNOSIS — F0390 Unspecified dementia without behavioral disturbance: Secondary | ICD-10-CM | POA: Diagnosis not present

## 2016-03-26 DIAGNOSIS — I5032 Chronic diastolic (congestive) heart failure: Secondary | ICD-10-CM | POA: Diagnosis not present

## 2016-03-26 DIAGNOSIS — I2510 Atherosclerotic heart disease of native coronary artery without angina pectoris: Secondary | ICD-10-CM | POA: Diagnosis not present

## 2016-03-26 DIAGNOSIS — H401132 Primary open-angle glaucoma, bilateral, moderate stage: Secondary | ICD-10-CM | POA: Diagnosis not present

## 2016-03-26 DIAGNOSIS — F05 Delirium due to known physiological condition: Secondary | ICD-10-CM | POA: Diagnosis not present

## 2016-03-27 DIAGNOSIS — F05 Delirium due to known physiological condition: Secondary | ICD-10-CM | POA: Diagnosis not present

## 2016-03-27 DIAGNOSIS — F0390 Unspecified dementia without behavioral disturbance: Secondary | ICD-10-CM | POA: Diagnosis not present

## 2016-03-27 DIAGNOSIS — I2510 Atherosclerotic heart disease of native coronary artery without angina pectoris: Secondary | ICD-10-CM | POA: Diagnosis not present

## 2016-03-27 DIAGNOSIS — I110 Hypertensive heart disease with heart failure: Secondary | ICD-10-CM | POA: Diagnosis not present

## 2016-03-27 DIAGNOSIS — I5032 Chronic diastolic (congestive) heart failure: Secondary | ICD-10-CM | POA: Diagnosis not present

## 2016-03-27 DIAGNOSIS — R531 Weakness: Secondary | ICD-10-CM | POA: Diagnosis not present

## 2016-03-29 DIAGNOSIS — I5032 Chronic diastolic (congestive) heart failure: Secondary | ICD-10-CM | POA: Diagnosis not present

## 2016-03-29 DIAGNOSIS — F05 Delirium due to known physiological condition: Secondary | ICD-10-CM | POA: Diagnosis not present

## 2016-03-29 DIAGNOSIS — I110 Hypertensive heart disease with heart failure: Secondary | ICD-10-CM | POA: Diagnosis not present

## 2016-03-29 DIAGNOSIS — I2510 Atherosclerotic heart disease of native coronary artery without angina pectoris: Secondary | ICD-10-CM | POA: Diagnosis not present

## 2016-03-29 DIAGNOSIS — F0390 Unspecified dementia without behavioral disturbance: Secondary | ICD-10-CM | POA: Diagnosis not present

## 2016-03-29 DIAGNOSIS — R531 Weakness: Secondary | ICD-10-CM | POA: Diagnosis not present

## 2016-04-04 ENCOUNTER — Telehealth: Payer: Self-pay | Admitting: Internal Medicine

## 2016-04-04 ENCOUNTER — Ambulatory Visit (INDEPENDENT_AMBULATORY_CARE_PROVIDER_SITE_OTHER)
Admission: RE | Admit: 2016-04-04 | Discharge: 2016-04-04 | Disposition: A | Discharge status: Home / Self Care | Payer: Medicare Other | Source: Ambulatory Visit | Attending: Internal Medicine | Admitting: Internal Medicine

## 2016-04-04 ENCOUNTER — Encounter: Payer: Self-pay | Admitting: Internal Medicine

## 2016-04-04 ENCOUNTER — Ambulatory Visit (INDEPENDENT_AMBULATORY_CARE_PROVIDER_SITE_OTHER): Payer: Medicare Other | Admitting: Internal Medicine

## 2016-04-04 ENCOUNTER — Other Ambulatory Visit (INDEPENDENT_AMBULATORY_CARE_PROVIDER_SITE_OTHER): Payer: Medicare Other

## 2016-04-04 VITALS — BP 120/64 | HR 70 | Ht 63.5 in | Wt 123.2 lb

## 2016-04-04 DIAGNOSIS — J449 Chronic obstructive pulmonary disease, unspecified: Secondary | ICD-10-CM

## 2016-04-04 DIAGNOSIS — J45909 Unspecified asthma, uncomplicated: Secondary | ICD-10-CM

## 2016-04-04 DIAGNOSIS — E871 Hypo-osmolality and hyponatremia: Secondary | ICD-10-CM

## 2016-04-04 DIAGNOSIS — I200 Unstable angina: Secondary | ICD-10-CM | POA: Diagnosis not present

## 2016-04-04 DIAGNOSIS — R05 Cough: Secondary | ICD-10-CM

## 2016-04-04 DIAGNOSIS — J40 Bronchitis, not specified as acute or chronic: Secondary | ICD-10-CM | POA: Diagnosis not present

## 2016-04-04 DIAGNOSIS — J42 Unspecified chronic bronchitis: Secondary | ICD-10-CM

## 2016-04-04 DIAGNOSIS — R059 Cough, unspecified: Secondary | ICD-10-CM

## 2016-04-04 LAB — CBC WITH DIFFERENTIAL/PLATELET
Basophils Absolute: 0 10*3/uL (ref 0.0–0.1)
Basophils Relative: 0.2 % (ref 0.0–3.0)
EOS PCT: 0.9 % (ref 0.0–5.0)
Eosinophils Absolute: 0.1 10*3/uL (ref 0.0–0.7)
HEMATOCRIT: 35.3 % — AB (ref 36.0–46.0)
HEMOGLOBIN: 12.3 g/dL (ref 12.0–15.0)
LYMPHS PCT: 28.5 % (ref 12.0–46.0)
Lymphs Abs: 3.4 10*3/uL (ref 0.7–4.0)
MCHC: 34.7 g/dL (ref 30.0–36.0)
MCV: 86.9 fl (ref 78.0–100.0)
MONOS PCT: 12.2 % — AB (ref 3.0–12.0)
Monocytes Absolute: 1.5 10*3/uL — ABNORMAL HIGH (ref 0.1–1.0)
Neutro Abs: 7 10*3/uL (ref 1.4–7.7)
Neutrophils Relative %: 58.2 % (ref 43.0–77.0)
Platelets: 256 10*3/uL (ref 150.0–400.0)
RBC: 4.07 Mil/uL (ref 3.87–5.11)
RDW: 13.9 % (ref 11.5–15.5)
WBC: 12.1 10*3/uL — AB (ref 4.0–10.5)

## 2016-04-04 LAB — BASIC METABOLIC PANEL
BUN: 21 mg/dL (ref 6–23)
CALCIUM: 9.6 mg/dL (ref 8.4–10.5)
CO2: 30 meq/L (ref 19–32)
CREATININE: 1.05 mg/dL (ref 0.40–1.20)
Chloride: 91 mEq/L — ABNORMAL LOW (ref 96–112)
GFR: 51.72 mL/min — ABNORMAL LOW (ref >60.00)
GLUCOSE: 82 mg/dL (ref 70–99)
Potassium: 5.5 mEq/L — ABNORMAL HIGH (ref 3.5–5.1)
SODIUM: 126 meq/L — AB (ref 135–145)

## 2016-04-04 NOTE — Patient Instructions (Addendum)
Order- CXR    Dx chronic bronchitis with acute exacerbation  Order- lab- CBC w diff, BMET    Dx hyponatremia  You can follow-up with Dr Damita Dunnings as planned

## 2016-04-04 NOTE — Telephone Encounter (Signed)
Per Joellen Jersey, okay to work in this afternoon [place] to make the appt Patient is aware Will sign off

## 2016-04-04 NOTE — Progress Notes (Signed)
Subjective:    Patient ID: Jill Howell, female    DOB: [birth year], 82 y.o.   MRN: [id number]  HPI Female Never smoker with known hx of AB, AR  06/29/15- 82 year old female never smoker followed for Asthmatic bronchitis, rhinitis, complicated by CAD/ MI/ AF CHF/ CABG/ CEA, DM, GERD FOLLOWS FOR: Pt states she continues to have cough; wants flu shot today as well and Prevnar if possible. Transportation is difficult. She harvests walnuts each year and fingers are stained from. She asks if we can check for pecan allergy. Questions possible association with itching. Chronic mild cough usually nonproductive, without wheeze. She remains active for her age. CXR 04/04/15-  IMPRESSION: COPD, cardiomegaly. No active disease. Electronically Signed  By: Rolm Baptise M.D.  On: 04/04/2015 13:54  04/04/2016-82 year old female never smoker followed for asthmatic bronchitis, rhinitis, complicated by CAD/MI/AF/CHF/CABG/CEA, DM, GERD ACUTE VISIT: Pt walked in; continues to have cough since being in hospital recently. Denies any chest congestion, fever, chills, SOB, or wheezing. Son dropped her off and is coming back for her. He was going to visit somebody at the hospital so she thought she would come a long. Hospital [id number] with complications of dementia/acute encephalopathy, chronic diastolic CHF and dehydration with hyponatremia She describes mostly dry cough with little wheeze. Denies choking or strangling with food or recognized reflux. CXR 02/19/2016 IMPRESSION: 1. Stable mild cardiomegaly without overt pulmonary edema. 2. Slight blunting of the posterior costophrenic angles bilaterally, which suggests trace bilateral pleural effusions. 3. No acute pulmonary disease. Lungs appear hyperinflated, unchanged, suggesting obstructive lung disease. Electronically Signed   By: Ilona Sorrel M.D.   On: 02/19/2016 16:29  Review of Systems- see HPI Constitutional:   No-   weight loss, night sweats,  fevers, chills, fatigue, lassitude. HEENT:   No-  headaches, difficulty swallowing, tooth/dental problems, sore throat,       No-sneezing, no- itching, ear ache, nasal congestion, post nasal drip,  CV:  No-   chest pain, orthopnea, PND, swelling in lower extremities, anasarca, dizziness, palpitations Resp: No-   shortness of breath with exertion or at rest.              No-  productive cough,  + non-productive cough,  No-  coughing up of blood.              No-   change in color of mucus.  No- wheezing.   Skin: No-   rash or lesions. GI:  No-   heartburn, indigestion, abdominal pain, nausea, vomiting,  GU:  MS:  No-   joint pain or swelling.  . Neuro-   Psych:  No- change in mood or affect. No depression or anxiety.  No memory loss.  Objective:   Physical Exam General- Alert, Oriented, Affect-appropriate, Distress- none acute, slender, very alert, talkative Skin- + hands stained by walnuts  Lymphadenopathy- none Head- atraumatic            Eyes- Gross vision intact, PERRLA, conjunctivae clear secretions            Ears- Hearing, canals-normal for age            Nose- Clear, no-Septal dev, mucus, polyps, erosion, perforation             Throat- Mallampati II , mucosa clear , drainage- none, tonsils- atrophic.  Neck- flexible , trachea midline, no stridor , thyroid nl, carotid no bruit.  Chest - symmetrical excursion , unlabored  Heart/CV- RRR , Murmur-none, no gallop  , no rub, nl s1 s2                           - JVD- none , edema- none, stasis changes- none, varices- none           Lung- + few crackles R base, unlabored, wheeze- none, cough-none , dullness-none, rub- none           Chest wall-  Abd-  Br/ Gen/ Rectal- Not done, not indicated Extrem- cyanosis- none, clubbing, none, atrophy- none, strength- nl Neuro- grossly intact to observation

## 2016-04-04 NOTE — Telephone Encounter (Signed)
Martin Majestic out to lobby to speak with patient She is c/o prod cough with clear mucus since she was hospitalized for dehydration June 2016.  Son mentions some increased SOB but pt denied this symptom.  She also denies any wheezing, tightness, f/c/s, hemoptysis or CP.  Last ov 10.20.16 w/ CY: Instructions     Return in about 1 year (around 06/28/2016).  Flu vax   Prevnar 13 pneumonia vaccine ( you won't need to repeat this one)   Dr Annamaria Boots please advise, thank you.  Allergies  Allergen Reactions  . Furosemide Other (See Comments)    Patient prefers not to take med, weakness and frequent urination  . Penicillins Other (See Comments)    unknown

## 2016-04-05 ENCOUNTER — Telehealth: Payer: Self-pay | Admitting: Internal Medicine

## 2016-04-05 NOTE — Telephone Encounter (Signed)
Spoke with pt's son, Horton Finer. They are wanting results from pt's CXR on 04/04/16.  CY - please advise. Thanks.

## 2016-04-10 NOTE — Telephone Encounter (Signed)
Pt is aware of results of cxr per note from 8/1

## 2016-04-15 ENCOUNTER — Telehealth: Payer: Self-pay | Admitting: Family Medicine

## 2016-04-15 ENCOUNTER — Telehealth: Payer: Self-pay | Admitting: Cardiovascular Disease

## 2016-04-15 NOTE — Telephone Encounter (Signed)
S/w Arbie Cookey, at [facility], who states pt thinks she is to take atenolol 12.5mg  (1/2 tablet) twice daily. When pharmacist explained dosage was qd, she asks for someone from our office to call and clarify. According to the prescription, pt is to take 12.5mg  once daily.  Attempted to contact pt. No answer, no VM on either phone number. Will call again.

## 2016-04-15 NOTE — Telephone Encounter (Signed)
Spoke with pt; pt is weak feeling and wonders if Na might be low and wonders if could have lab test done to ck Na. Pt wants to send note before making appt to see Dr Damita Dunnings. Pt request cb. No CP,SOB or dizziness.

## 2016-04-15 NOTE — Telephone Encounter (Signed)
See if she can come in tomorrow for OV. Thanks.

## 2016-04-15 NOTE — Telephone Encounter (Signed)
Pharmacist calling regarding doseage on Atenolol 25 mg.  Please call.

## 2016-04-15 NOTE — Telephone Encounter (Signed)
Patient advised. Appt scheduled. 

## 2016-04-15 NOTE — Telephone Encounter (Signed)
Patient Name: Jill Howell DOB: [birth year] Initial Comment Caller states wants appt for Dr Damita Dunnings to check sodium; thinks sodium is down; Nurse Assessment Nurse: Ronnald Ramp, RN, Miranda Date/Time (Eastern Time): 04/15/2016 2:49:08 PM Confirm and document reason for call. If symptomatic, describe symptoms. You must click the next button to save text entered. ---Caller states she fell on Saturday and she fell again today. Has the patient traveled out of the country within the last 30 days? ---Not Applicable Does the patient have any new or worsening symptoms? ---Yes Will a triage be completed? ---No Select reason for no triage. ---Other Please document clinical information provided and list any resource used. ---Caller had a visitor come to her home while on the phone and states she will call back. Guidelines Guideline Title Affirmed Question Affirmed Notes Final Disposition User Clinical Call Ronnald Ramp, RN, [employer]

## 2016-04-16 ENCOUNTER — Encounter: Payer: Self-pay | Admitting: Family Medicine

## 2016-04-16 ENCOUNTER — Telehealth: Payer: Self-pay

## 2016-04-16 ENCOUNTER — Ambulatory Visit (INDEPENDENT_AMBULATORY_CARE_PROVIDER_SITE_OTHER): Payer: Medicare Other | Admitting: Family Medicine

## 2016-04-16 VITALS — BP 150/60 | HR 78 | Temp 97.3°F | Wt 122.0 lb

## 2016-04-16 DIAGNOSIS — R531 Weakness: Secondary | ICD-10-CM | POA: Diagnosis not present

## 2016-04-16 DIAGNOSIS — I200 Unstable angina: Secondary | ICD-10-CM

## 2016-04-16 LAB — CBC WITH DIFFERENTIAL/PLATELET
BASOS ABS: 0 10*3/uL (ref 0.0–0.1)
BASOS PCT: 0.4 % (ref 0.0–3.0)
EOS PCT: 0.8 % (ref 0.0–5.0)
Eosinophils Absolute: 0.1 10*3/uL (ref 0.0–0.7)
HEMATOCRIT: 34 % — AB (ref 36.0–46.0)
Hemoglobin: 11.7 g/dL — ABNORMAL LOW (ref 12.0–15.0)
LYMPHS ABS: 2.2 10*3/uL (ref 0.7–4.0)
LYMPHS PCT: 25.1 % (ref 12.0–46.0)
MCHC: 34.4 g/dL (ref 30.0–36.0)
MCV: 86.6 fl (ref 78.0–100.0)
MONOS PCT: 10.9 % (ref 3.0–12.0)
Monocytes Absolute: 1 10*3/uL (ref 0.1–1.0)
NEUTROS ABS: 5.5 10*3/uL (ref 1.4–7.7)
NEUTROS PCT: 62.8 % (ref 43.0–77.0)
PLATELETS: 189 10*3/uL (ref 150.0–400.0)
RBC: 3.93 Mil/uL (ref 3.87–5.11)
RDW: 14.1 % (ref 11.5–15.5)
WBC: 8.7 10*3/uL (ref 4.0–10.5)

## 2016-04-16 LAB — BASIC METABOLIC PANEL
BUN: 19 mg/dL (ref 6–23)
CALCIUM: 9.5 mg/dL (ref 8.4–10.5)
CHLORIDE: 92 meq/L — AB (ref 96–112)
CO2: 28 mEq/L (ref 19–32)
CREATININE: 0.9 mg/dL (ref 0.40–1.20)
GFR: 61.79 mL/min (ref >60.00)
Glucose, Bld: 119 mg/dL — ABNORMAL HIGH (ref 70–99)
Potassium: 5.1 mEq/L (ref 3.5–5.1)
Sodium: 126 mEq/L — ABNORMAL LOW (ref 135–145)

## 2016-04-16 LAB — TSH: TSH: 0.73 u[IU]/mL (ref 0.35–4.50)

## 2016-04-16 NOTE — Telephone Encounter (Signed)
Attempted to contact patient again. No answer, no VM on home or cell phone. Called both sons who are her Emergency Contacts. No answer.

## 2016-04-16 NOTE — Telephone Encounter (Signed)
Called pt home and cell. No answer, no VM.

## 2016-04-16 NOTE — Patient Instructions (Signed)
Go to the lab on the way out.  We'll contact you with your lab report. Check your med list at home. Please don't stop any meds without talking to a doc first.  Take care.  Glad to see you.

## 2016-04-16 NOTE — Telephone Encounter (Signed)
Jill Howell from [facility] left v/m requesting cb to verify dosage for levothyroxine; I verified with Dr Damita Dunnings and pt should be taking levothyroxine 100 mcg daily. I called [facility] and notified as instructed. [facility] voiced understanding.

## 2016-04-16 NOTE — Progress Notes (Signed)
She had stopped "a sy-" med "a while ago".  She couldn't give a specific age.  It could be her synthroid but it [place] not be.  D/w pt.  It is unclear about her med list.  She has been feeling "weak" recently w/o focal change.  She couldn't elaborate otherwise.  H/o low Na, had been drinking sports drinks in the meantime.  No FCNAVD.    Meds, vitals, and allergies reviewed.   ROS: Per HPI unless specifically indicated in ROS section   GEN: nad, alert and oriented HEENT: mucous membranes moist NECK: supple w/o LA CV: rrr. PULM: ctab, no inc wob ABD: soft, +bs EXT: no edema CN 2-12 wnl B, S/S wnl x4 Speech wnl.

## 2016-04-16 NOTE — Progress Notes (Signed)
Pre visit review using our clinic review tool, if applicable. No additional management support is needed unless otherwise documented below in the visit note. 

## 2016-04-17 DIAGNOSIS — R5383 Other fatigue: Secondary | ICD-10-CM | POA: Insufficient documentation

## 2016-04-17 NOTE — Assessment & Plan Note (Signed)
Diffuse, per patient, w/o focal finding.  She appears to be at baseline o/w.  No slurred speech.  D/w pt about her med list.   At this point, would recheck routine labs and then notify her/her family.  She'll likely need help with med mgmt at home and I'll ask for family help with that.  She doesn't appear toxic or ill.

## 2016-04-18 ENCOUNTER — Telehealth: Payer: Self-pay | Admitting: Family Medicine

## 2016-04-18 NOTE — Telephone Encounter (Signed)
Refer to lab notes 

## 2016-04-18 NOTE — Telephone Encounter (Signed)
Patient's son returned Araceli's call.  Please call patient back at work.

## 2016-04-22 ENCOUNTER — Emergency Department: Payer: Medicare Other

## 2016-04-22 ENCOUNTER — Inpatient Hospital Stay
Admission: EM | Admit: 2016-04-22 | Discharge: 2016-04-25 | DRG: 640 | Disposition: A | Discharge status: Skilled Nursing Facility | Payer: Medicare Other | Attending: Specialist | Admitting: Specialist

## 2016-04-22 DIAGNOSIS — Z8249 Family history of ischemic heart disease and other diseases of the circulatory system: Secondary | ICD-10-CM

## 2016-04-22 DIAGNOSIS — E785 Hyperlipidemia, unspecified: Secondary | ICD-10-CM | POA: Diagnosis present

## 2016-04-22 DIAGNOSIS — Z7982 Long term (current) use of aspirin: Secondary | ICD-10-CM

## 2016-04-22 DIAGNOSIS — Z880 Allergy status to penicillin: Secondary | ICD-10-CM

## 2016-04-22 DIAGNOSIS — Z888 Allergy status to other drugs, medicaments and biological substances status: Secondary | ICD-10-CM | POA: Diagnosis not present

## 2016-04-22 DIAGNOSIS — E871 Hypo-osmolality and hyponatremia: Secondary | ICD-10-CM | POA: Diagnosis present

## 2016-04-22 DIAGNOSIS — Z820 Family history of epilepsy and other diseases of the nervous system: Secondary | ICD-10-CM | POA: Diagnosis not present

## 2016-04-22 DIAGNOSIS — I2510 Atherosclerotic heart disease of native coronary artery without angina pectoris: Secondary | ICD-10-CM | POA: Diagnosis present

## 2016-04-22 DIAGNOSIS — Z809 Family history of malignant neoplasm, unspecified: Secondary | ICD-10-CM | POA: Diagnosis not present

## 2016-04-22 DIAGNOSIS — I110 Hypertensive heart disease with heart failure: Secondary | ICD-10-CM | POA: Diagnosis present

## 2016-04-22 DIAGNOSIS — F05 Delirium due to known physiological condition: Secondary | ICD-10-CM | POA: Diagnosis not present

## 2016-04-22 DIAGNOSIS — Z951 Presence of aortocoronary bypass graft: Secondary | ICD-10-CM | POA: Diagnosis not present

## 2016-04-22 DIAGNOSIS — R410 Disorientation, unspecified: Secondary | ICD-10-CM | POA: Diagnosis not present

## 2016-04-22 DIAGNOSIS — E039 Hypothyroidism, unspecified: Secondary | ICD-10-CM | POA: Diagnosis present

## 2016-04-22 DIAGNOSIS — Z8673 Personal history of transient ischemic attack (TIA), and cerebral infarction without residual deficits: Secondary | ICD-10-CM

## 2016-04-22 DIAGNOSIS — I5032 Chronic diastolic (congestive) heart failure: Secondary | ICD-10-CM | POA: Diagnosis present

## 2016-04-22 DIAGNOSIS — I10 Essential (primary) hypertension: Secondary | ICD-10-CM | POA: Diagnosis not present

## 2016-04-22 DIAGNOSIS — I252 Old myocardial infarction: Secondary | ICD-10-CM

## 2016-04-22 DIAGNOSIS — K219 Gastro-esophageal reflux disease without esophagitis: Secondary | ICD-10-CM | POA: Diagnosis present

## 2016-04-22 DIAGNOSIS — Z90710 Acquired absence of both cervix and uterus: Secondary | ICD-10-CM

## 2016-04-22 DIAGNOSIS — G9341 Metabolic encephalopathy: Secondary | ICD-10-CM | POA: Diagnosis present

## 2016-04-22 DIAGNOSIS — E875 Hyperkalemia: Secondary | ICD-10-CM | POA: Diagnosis present

## 2016-04-22 DIAGNOSIS — E876 Hypokalemia: Secondary | ICD-10-CM | POA: Diagnosis present

## 2016-04-22 DIAGNOSIS — R404 Transient alteration of awareness: Secondary | ICD-10-CM | POA: Diagnosis not present

## 2016-04-22 DIAGNOSIS — E861 Hypovolemia: Secondary | ICD-10-CM | POA: Diagnosis present

## 2016-04-22 DIAGNOSIS — J45909 Unspecified asthma, uncomplicated: Secondary | ICD-10-CM | POA: Diagnosis present

## 2016-04-22 DIAGNOSIS — Z79899 Other long term (current) drug therapy: Secondary | ICD-10-CM

## 2016-04-22 DIAGNOSIS — R4182 Altered mental status, unspecified: Secondary | ICD-10-CM | POA: Diagnosis not present

## 2016-04-22 DIAGNOSIS — F0390 Unspecified dementia without behavioral disturbance: Secondary | ICD-10-CM | POA: Diagnosis present

## 2016-04-22 DIAGNOSIS — Z9049 Acquired absence of other specified parts of digestive tract: Secondary | ICD-10-CM | POA: Diagnosis not present

## 2016-04-22 DIAGNOSIS — E539 Vitamin B deficiency, unspecified: Secondary | ICD-10-CM | POA: Diagnosis not present

## 2016-04-22 DIAGNOSIS — Z9889 Other specified postprocedural states: Secondary | ICD-10-CM

## 2016-04-22 DIAGNOSIS — E119 Type 2 diabetes mellitus without complications: Secondary | ICD-10-CM | POA: Diagnosis present

## 2016-04-22 LAB — COMPREHENSIVE METABOLIC PANEL
ALT: 29 U/L (ref 14–54)
AST: 50 U/L — AB (ref 15–41)
Albumin: 4.4 g/dL (ref 3.5–5.0)
Alkaline Phosphatase: 65 U/L (ref 38–126)
Anion gap: 10 (ref 5–15)
BUN: 24 mg/dL — ABNORMAL HIGH (ref 6–20)
CHLORIDE: 90 mmol/L — AB (ref 101–111)
CO2: 24 mmol/L (ref 22–32)
CREATININE: 1.02 mg/dL — AB (ref 0.44–1.00)
Calcium: 9.5 mg/dL (ref 8.9–10.3)
GFR, EST AFRICAN AMERICAN: 52 mL/min — AB (ref >60)
GFR, EST NON AFRICAN AMERICAN: 45 mL/min — AB (ref >60)
Glucose, Bld: 90 mg/dL (ref 65–99)
POTASSIUM: 5.9 mmol/L — AB (ref 3.5–5.1)
SODIUM: 124 mmol/L — AB (ref 135–145)
Total Bilirubin: 1.1 mg/dL (ref 0.3–1.2)
Total Protein: 7.2 g/dL (ref 6.5–8.1)

## 2016-04-22 LAB — CBC
HEMATOCRIT: 35.8 % (ref 35.0–47.0)
Hemoglobin: 12.5 g/dL (ref 12.0–16.0)
MCH: 29.8 pg (ref 26.0–34.0)
MCHC: 35 g/dL (ref 32.0–36.0)
MCV: 85.2 fL (ref 80.0–100.0)
PLATELETS: 182 10*3/uL (ref 150–440)
RBC: 4.2 MIL/uL (ref 3.80–5.20)
RDW: 14.3 % (ref 11.5–14.5)
WBC: 12.9 10*3/uL — AB (ref 3.6–11.0)

## 2016-04-22 LAB — URINALYSIS COMPLETE WITH MICROSCOPIC (ARMC ONLY)
BACTERIA UA: NONE SEEN
BILIRUBIN URINE: NEGATIVE
Glucose, UA: NEGATIVE mg/dL
HGB URINE DIPSTICK: NEGATIVE
Ketones, ur: NEGATIVE mg/dL
LEUKOCYTES UA: NEGATIVE
NITRITE: NEGATIVE
PH: 7 (ref 5.0–8.0)
PROTEIN: NEGATIVE mg/dL
SQUAMOUS EPITHELIAL / LPF: NONE SEEN
Specific Gravity, Urine: 1.006 (ref 1.005–1.030)

## 2016-04-22 LAB — TROPONIN I: Troponin I: <0.03 ng/mL (ref <0.03)

## 2016-04-22 LAB — DIGOXIN LEVEL: Digoxin Level: 0.8 ng/mL (ref 0.8–2.0)

## 2016-04-22 LAB — TSH: TSH: 0.893 u[IU]/mL (ref 0.350–4.500)

## 2016-04-22 MED ORDER — ACETAMINOPHEN 325 MG PO TABS
650.0000 mg | ORAL_TABLET | Freq: Four times a day (QID) | ORAL | Status: DC | PRN
  Administered 2016-04-24: 20:00:00 650 mg via ORAL
  Filled 2016-04-22: qty 2

## 2016-04-22 MED ORDER — SODIUM CHLORIDE 0.9 % IV SOLN
INTRAVENOUS | Status: DC
  Administered 2016-04-22 – 2016-04-23 (×3): via INTRAVENOUS

## 2016-04-22 MED ORDER — ENOXAPARIN SODIUM 30 MG/0.3ML ~~LOC~~ SOLN
30.0000 mg | SUBCUTANEOUS | Status: DC
  Administered 2016-04-22: 30 mg via SUBCUTANEOUS
  Filled 2016-04-22: qty 0.3

## 2016-04-22 MED ORDER — DIGOXIN 125 MCG PO TABS
125.0000 ug | ORAL_TABLET | Freq: Every day | ORAL | Status: DC
  Administered 2016-04-23 – 2016-04-25 (×3): 125 ug via ORAL
  Filled 2016-04-22 (×3): qty 1

## 2016-04-22 MED ORDER — VITAMIN B-12 1000 MCG PO TABS
1000.0000 ug | ORAL_TABLET | Freq: Every day | ORAL | Status: DC
  Administered 2016-04-23 – 2016-04-25 (×3): 1000 ug via ORAL
  Filled 2016-04-22 (×3): qty 1

## 2016-04-22 MED ORDER — PANTOPRAZOLE SODIUM 40 MG PO TBEC
40.0000 mg | DELAYED_RELEASE_TABLET | Freq: Every day | ORAL | Status: DC
  Administered 2016-04-23 – 2016-04-25 (×3): 40 mg via ORAL
  Filled 2016-04-22 (×3): qty 1

## 2016-04-22 MED ORDER — LEVOTHYROXINE SODIUM 50 MCG PO TABS
100.0000 ug | ORAL_TABLET | Freq: Every day | ORAL | Status: DC
  Administered 2016-04-23 – 2016-04-25 (×3): 100 ug via ORAL
  Filled 2016-04-22 (×3): qty 2

## 2016-04-22 MED ORDER — ASPIRIN EC 81 MG PO TBEC
81.0000 mg | DELAYED_RELEASE_TABLET | Freq: Every day | ORAL | Status: DC
  Administered 2016-04-23 – 2016-04-25 (×3): 81 mg via ORAL
  Filled 2016-04-22 (×3): qty 1

## 2016-04-22 MED ORDER — HYDRALAZINE HCL 20 MG/ML IJ SOLN
10.0000 mg | INTRAMUSCULAR | Status: DC | PRN
  Filled 2016-04-22: qty 1

## 2016-04-22 MED ORDER — SIMVASTATIN 40 MG PO TABS
40.0000 mg | ORAL_TABLET | Freq: Every day | ORAL | Status: DC
Start: 2016-04-23 — End: 2016-04-25
  Administered 2016-04-23 – 2016-04-24 (×2): 40 mg via ORAL
  Filled 2016-04-22 (×2): qty 1

## 2016-04-22 MED ORDER — OLANZAPINE 2.5 MG PO TABS
2.5000 mg | ORAL_TABLET | Freq: Every day | ORAL | Status: DC
Start: 2016-04-23 — End: 2016-04-25
  Administered 2016-04-23 – 2016-04-24 (×3): 2.5 mg via ORAL
  Filled 2016-04-22 (×4): qty 1

## 2016-04-22 MED ORDER — ACETAMINOPHEN 650 MG RE SUPP: 650.0000 mg | Freq: Four times a day (QID) | RECTAL | Status: DC | PRN

## 2016-04-22 MED ORDER — ISOSORBIDE MONONITRATE ER 30 MG PO TB24
30.0000 mg | ORAL_TABLET | Freq: Every day | ORAL | Status: DC
  Administered 2016-04-23 – 2016-04-25 (×3): 30 mg via ORAL
  Filled 2016-04-22 (×3): qty 1

## 2016-04-22 MED ORDER — OXYCODONE HCL 5 MG PO TABS
5.0000 mg | ORAL_TABLET | ORAL | Status: DC | PRN
  Administered 2016-04-22: 5 mg via ORAL
  Filled 2016-04-22: qty 1

## 2016-04-22 MED ORDER — SODIUM POLYSTYRENE SULFONATE 15 GM/60ML PO SUSP
15.0000 g | Freq: Once | ORAL | Status: AC
  Administered 2016-04-22: 15 g via ORAL
  Filled 2016-04-22: qty 60

## 2016-04-22 MED ORDER — ATENOLOL 25 MG PO TABS
12.5000 mg | ORAL_TABLET | Freq: Every day | ORAL | Status: DC
  Administered 2016-04-23 – 2016-04-25 (×3): 12.5 mg via ORAL
  Filled 2016-04-22 (×3): qty 1

## 2016-04-22 MED ORDER — ACETAMINOPHEN 500 MG PO TABS
1000.0000 mg | ORAL_TABLET | Freq: Once | ORAL | Status: AC
  Administered 2016-04-22: 1000 mg via ORAL
  Filled 2016-04-22: qty 2

## 2016-04-22 NOTE — ED Provider Notes (Signed)
[facility] Emergency Department Provider Note  ____________________________________________  Time seen: Approximately 4:05 PM  I have reviewed the triage vital signs and the nursing notes.   HISTORY  Chief Complaint Altered Mental Status  Level 5 caveat:  Portions of the history and physical were unable to be obtained due to AMS   HPI Jill Howell is a 82 y.o. female a history of atrial fibrillation on anticoagulants, CAD, anemia, diastolic CHF, carotid artery disease, hypertension, hyperlipidemia, TIA, diabetes who presents for evaluation of altered mental status. Patient is brought in by EMS. She was at her pharmacy earlier today where she is very well known and is usually alert and oriented 3. They noticed that she was trying to back up multiple times into the same parking space. One of the members of the pharmacy approach her and noticed the patient was confused which is not her baseline. EMS was called. Patient does not know where she is at this time. She is answering questions and following commands. She is complaining of a right-sided headache, moderate, throbbing. She is unable to give me any more details on her headache.  Past Medical History:  Diagnosis Date  . Allergic rhinitis, cause unspecified   . Anemia   . Asthma   . Atrial fibrillation ([place])    a. remote->no anticoagulation 2/2 h/o falls.  [name] CAD (coronary artery disease)    a. 2000 s/p MI/CABG x 4 (VG-PDA, VG-OM, VG-Diag, LIMA-LAD);  b. 2011 MI;  c. 08/2013 NSTEMI/Cath: LM 90-95, LAD 90/100p, LCX 99p, 172m, RCA 100ost, VG-PDA nl, VG-OM 100 (culprit w/ thrombus), VG-Diag nl, LIMA-LAD nl, EF 55-60%, mild inf HK, mod MR-->Med Rx.  . Carotid arterial disease ([place])    a. 05/2013 s/p L CEA.  . Chronic diastolic CHF (congestive heart failure) ([place])    a. 08/2013 Echo: EF 50-55%, no rwma, Gr2 DD, mod MR, mildly dil LA, PASP 65mmHg.  [name] Esophageal reflux   . History of TIA (transient ischemic attack)    . Hyperlipidemia   . Hypertension   . Hypothyroidism   . Myocardial infarction ([place])    08-2013  . Type II or unspecified type diabetes mellitus without mention of complication, not stated as uncontrolled   . Unspecified hearing loss     Patient Active Problem List   Diagnosis Date Noted  . Weakness 04/17/2016  . Pressure ulcer 02/20/2016  . Other affective psychosis   . Psychotic disorder with delusions   . CAD (coronary artery disease) 02/19/2016  . Dementia 02/19/2016  . Hyponatremia 02/19/2016  . Acute encephalopathy 02/19/2016  . Diastolic CHF, chronic ([place]) 02/19/2016  . Dehydration 02/19/2016  . Chest wall pain 04/05/2015  . Carotid stenosis 06/09/2014  . Aftercare following surgery of the circulatory system 06/09/2014  . Asthmatic bronchitis , chronic ([place]) 03/24/2014  . Family discord 02/18/2014  . Aftercare following surgery of the circulatory system, [place] 12/07/2013  . Malaise and fatigue 12/01/2013  . Benign hypertensive heart disease without heart failure 08/17/2013  . At risk for falls 08/12/2013  . Anemia   . Unstable angina ([place]) 08/10/2013  . NSTEMI (non-ST elevated myocardial infarction) ([place]) 08/10/2013  . TIA (transient ischemic attack) 05/17/2013  . Occlusion and stenosis of carotid artery without mention of cerebral infarction 05/13/2013  . Fall at home 05/11/2013  . Leg pain 08/11/2012  . PAF (paroxysmal atrial fibrillation) ([place]) 08/11/2012  . Back pain 05/07/2012  . Carotid artery disease ([place]) 08/19/2011  . Dermatitis 05/09/2011  .  HYPOTHYROIDISM 09/15/2007  . History of diabetes mellitus, type II 09/15/2007  . Hyperlipidemia 09/15/2007  . HEARING IMPAIRMENT 09/15/2007  . Coronary atherosclerosis 09/15/2007  . Congestive heart failure ([place]) 09/15/2007  . ALLERGIC RHINITIS 09/15/2007  . Unspecified asthma(493.90) 09/15/2007  . GERD 09/15/2007    Past Surgical History:  Procedure Laterality Date  . ABDOMINAL HYSTERECTOMY    . Bladder  tack     x 2  . CARDIAC CATHETERIZATION  09/07/1999   EF 25%. THERE IS SEVERE MITRAL ANNULAR CALIFICATION, WITH  3 + INSUFFICIENCY  . CARDIOVASCULAR STRESS TEST  06/30/2007   EF 63%, NO ISCHEMIA  . CAROTID ENDARTERECTOMY Left 05-19-13   cea  . CHOLECYSTECTOMY  10/2001  . CORONARY ARTERY BYPASS GRAFT  2000   4 vessel  . DILATION AND CURETTAGE OF UTERUS    . ENDARTERECTOMY Left 05/19/2013   Procedure: ENDARTERECTOMY CAROTID-LEFT;  Surgeon: Elam Dutch, MD;  Location: [facility];  Service: Vascular;  Laterality: Left;  . EYE SURGERY Bilateral    Cataract  . HEMORRHOID SURGERY    . LEFT HEART CATH  Dec. 2, 2014   With  Coronary Graft  Angiogram  . LEFT HEART CATHETERIZATION WITH CORONARY/GRAFT ANGIOGRAM  08/10/2013   Procedure: LEFT HEART CATHETERIZATION WITH Beatrix Fetters;  Surgeon: Peter M Martinique, MD;  Location: [facility] CATH LAB;  Service: Cardiovascular;;  . PARTIAL HYSTERECTOMY    . RECTOCELE REPAIR     x 2  . ROTATOR CUFF REPAIR    . TONSILLECTOMY    . US ECHOCARDIOGRAPHY  01/17/2005   EF 55-60%    Prior to Admission medications   Medication Sig Start Date End Date Taking? Authorizing Provider  aspirin 81 MG EC tablet Take 81 mg by mouth daily.      Historical Provider, MD  atenolol (TENORMIN) 25 MG tablet Take 0.5 tablets (12.5 mg total) by mouth daily. 02/14/16   Minna Merritts, MD  BIOTIN PO Take 1 capsule by mouth daily.     Historical Provider, MD  digoxin (DIGOX) 0.125 MG tablet Take 1 tablet (125 mcg total) by mouth daily. 09/05/14   Darlin Coco, MD  isosorbide mononitrate (IMDUR) 30 MG 24 hr tablet TAKE 1 TABLET (30 MG TOTAL) BY MOUTH DAILY. 02/19/16   Minna Merritts, MD  levothyroxine (SYNTHROID, LEVOTHROID) 100 MCG tablet TAKE 1 TABLET (100 MCG TOTAL) BY MOUTH DAILY. 02/15/16   Tonia Ghent, MD  losartan (COZAAR) 25 MG tablet TAKE 1 TABLET (25 MG TOTAL) BY MOUTH DAILY. 03/20/16   Minna Merritts, MD  Multiple Vitamins-Minerals (PRESERVISION AREDS PO) Take 1  tablet by mouth daily.    Historical Provider, MD  nitroGLYCERIN (NITROSTAT) 0.4 MG SL tablet PLACE 1 TABLET UNDER THE TONGUE EVERY 5 MINUTES AS NEEDED FOR CHEST PAIN. 03/01/15   Darlin Coco, MD  OLANZapine (ZYPREXA) 2.5 MG tablet Take 1 tablet (2.5 mg total) by mouth at bedtime. 02/21/16   Mir Marry Guan, MD  pantoprazole (PROTONIX) 40 MG tablet Take 1 tablet (40 mg total) by mouth daily. 02/14/16   Minna Merritts, MD  simvastatin (ZOCOR) 40 MG tablet TAKE 1 TABLET (40 MG TOTAL) BY MOUTH EVERY EVENING. 05/01/15   Darlin Coco, MD  vitamin B-12 (CYANOCOBALAMIN) 1000 MCG tablet Take 1,000 mcg by mouth daily.     Historical Provider, MD    Allergies Furosemide and Penicillins  Family History  Problem Relation Age of Onset  . Heart attack Father     X's 3  .  Heart disease Father   . Heart disease Son     before age 50  . Cancer Brother   . Hypertension Mother   . Alzheimer's disease Mother     Social History Social History  Substance Use Topics  . Smoking status: Never Smoker  . Smokeless tobacco: Never Used  . Alcohol use No    Review of Systems  Constitutional: Negative for fever. Eyes: Negative for visual changes. ENT: Negative for sore throat. Cardiovascular: Negative for chest pain. Respiratory: Negative for shortness of breath. Gastrointestinal: Negative for abdominal pain, vomiting or diarrhea. Genitourinary: Negative for dysuria. Musculoskeletal: Negative for back pain. Skin: Negative for rash. Neurological: Negative for weakness or numbness. + HA and confusion  ____________________________________________   PHYSICAL EXAM:  VITAL SIGNS: ED Triage Vitals [04/22/16 1554]  Enc Vitals Group     BP (!) 188/83     Pulse Rate 90     Resp 18     Temp 98.1 F (36.7 C)     Temp Source Oral     SpO2 96 %     Weight 130 lb (59 kg)     Height      Head Circumference      Peak Flow      Pain Score 5     Pain Loc      Pain Edu?      Excl. in [place]?      Constitutional: Alert and oriented x 1. Well appearing and in no apparent distress. HEENT:      Head: Normocephalic and atraumatic.         Eyes: Conjunctivae are normal. Sclera is non-icteric. EOMI. PERRL      Mouth/Throat: Mucous membranes are moist.       Neck: Supple with no signs of meningismus. Cardiovascular: Regular rate and rhythm. No murmurs, gallops, or rubs. 2+ symmetrical distal pulses are present in all extremities. No JVD. Respiratory: Normal respiratory effort. Lungs are clear to auscultation bilaterally. No wheezes, crackles, or rhonchi.  Gastrointestinal: Soft, non tender, and non distended with positive bowel sounds. No rebound or guarding. Genitourinary: No CVA tenderness. Musculoskeletal: Nontender with normal range of motion in all extremities. No edema, cyanosis, or erythema of extremities. Neurologic: Normal speech and language. Face is symmetric. Moving all extremities. No gross focal neurologic deficits are appreciated. CN II-XII intact. Skin: Skin is warm, dry and intact. No rash noted.   ____________________________________________   LABS (all labs ordered are listed, but only abnormal results are displayed)  Labs Reviewed  COMPREHENSIVE METABOLIC PANEL - Abnormal; Notable for the following:       Result Value   Sodium 124 (*)    Potassium 5.9 (*)    Chloride 90 (*)    BUN 24 (*)    Creatinine, Ser 1.02 (*)    AST 50 (*)    GFR calc non Af Amer 45 (*)    GFR calc Af Amer 52 (*)    All other components within normal limits  CBC - Abnormal; Notable for the following:    WBC 12.9 (*)    All other components within normal limits  URINALYSIS COMPLETEWITH MICROSCOPIC ([facility] ONLY) - Abnormal; Notable for the following:    Color, Urine STRAW (*)    APPearance CLEAR (*)    All other components within normal limits  URINE CULTURE  TROPONIN I  TSH  CBG MONITORING, ED   ____________________________________________  EKG  ED ECG REPORT I, Rudene Re, the attending physician,  personally viewed and interpreted this ECG.  Normal sinus rhythm, rate of 91, first-degree AV block, normal QRS and QTc intervals, normal axis, ST depressions on inferior and lateral leads with ST elevation in aVR. These findings are old from prior. ____________________________________________  RADIOLOGY  Head CT: negative CXR: negative ____________________________________________   PROCEDURES  Procedure(s) performed: None Procedures Critical Care performed:  None ____________________________________________   INITIAL IMPRESSION / ASSESSMENT AND PLAN / ED COURSE  82 y.o. female a history of atrial fibrillation on anticoagulants, CAD, anemia, diastolic CHF, carotid artery disease, hypertension, hyperlipidemia, TIA, diabetes who presents for evaluation of confusion and HA. Patient usually A&O x 3 but was found to be confused at pharmacy today. No focal neuro deficits on exam. Patient oriented to self only. Complaining of HA. Will get head CT, labs, EKG, urine. Obs on telemetry.  Clinical Course   _________________________ 6:08 PM on 04/22/2016 -----------------------------------------  Labs showing mild hyponatremia at 124. head CT is negative. UA is negative. Patient continues to be confused. Family is at the bedside and reports this is definitely not patient's baseline. She remains neuro intact and moving all 4 extremities, answering to questions, following commands, and face is symmetric. Unclear etiology for her symptoms. Unclear if this is a possible ischemic stroke however very odd presentation with just confusion and no other neuro deficits. Therefore a few little hesitant at lowering patient's blood pressure at this time. I do feel more like she is delirious. Will discuss with the hospitalist for admission.    Pertinent labs & imaging results that were available during my care of the patient were reviewed by me and considered in my medical  decision making (see chart for details).    ____________________________________________   FINAL CLINICAL IMPRESSION(S) / ED DIAGNOSES  Final diagnoses:  Altered mental status, unspecified altered mental status type  Hyponatremia      NEW MEDICATIONS STARTED DURING THIS VISIT:  New Prescriptions   No medications on file     Note:  This document was prepared using Dragon voice recognition software and [place] include unintentional dictation errors.    Rudene Re, MD 04/22/16 [phone]
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
## 2016-04-22 NOTE — H&P (Signed)
[facility] NAME: Jill Howell    MR#:  [id number]  DATE OF BIRTH:  04-14-1919   DATE OF ADMISSION:  04/22/2016  PRIMARY CARE PHYSICIAN: Elsie Stain, MD   REQUESTING/REFERRING PHYSICIAN: Alfred Levins  CHIEF COMPLAINT:   Chief Complaint  Patient presents with  . Altered Mental Status    HISTORY OF PRESENT ILLNESS:  Jill Howell  is a 82 y.o. female with a known history of GERD without esophagitis, hypertension essential has presented with altered mental status. Patient is relatively poor historian however normally lives alone and takes care of herself. History obtained from emergency department staff. Patient was found driving to the drugstore having issues repeatedly removing her car back and forth in the parking lot she was approached by workers of the store seemed markedly confused sent to Hospital further workup and evaluation. Where she remains confused. Once again she is a relatively poor historian unable to provide much meaningful information.  PAST MEDICAL HISTORY:   Past Medical History:  Diagnosis Date  . Allergic rhinitis, cause unspecified   . Anemia   . Asthma   . Atrial fibrillation ([place])    a. remote->no anticoagulation 2/2 h/o falls.  [name] CAD (coronary artery disease)    a. 2000 s/p MI/CABG x 4 (VG-PDA, VG-OM, VG-Diag, LIMA-LAD);  b. 2011 MI;  c. 08/2013 NSTEMI/Cath: LM 90-95, LAD 90/100p, LCX 99p, 169m, RCA 100ost, VG-PDA nl, VG-OM 100 (culprit w/ thrombus), VG-Diag nl, LIMA-LAD nl, EF 55-60%, mild inf HK, mod MR-->Med Rx.  . Carotid arterial disease ([place])    a. 05/2013 s/p L CEA.  . Chronic diastolic CHF (congestive heart failure) ([place])    a. 08/2013 Echo: EF 50-55%, no rwma, Gr2 DD, mod MR, mildly dil LA, PASP 30mmHg.  [name] Esophageal reflux   . History of TIA (transient ischemic attack)   . Hyperlipidemia   . Hypertension   . Hypothyroidism   . Myocardial infarction ([place])    08-2013  . Type II or unspecified type  diabetes mellitus without mention of complication, not stated as uncontrolled   . Unspecified hearing loss     PAST SURGICAL HISTORY:   Past Surgical History:  Procedure Laterality Date  . ABDOMINAL HYSTERECTOMY    . Bladder tack     x 2  . CARDIAC CATHETERIZATION  09/07/1999   EF 25%. THERE IS SEVERE MITRAL ANNULAR CALIFICATION, WITH  3 + INSUFFICIENCY  . CARDIOVASCULAR STRESS TEST  06/30/2007   EF 63%, NO ISCHEMIA  . CAROTID ENDARTERECTOMY Left 05-19-13   cea  . CHOLECYSTECTOMY  10/2001  . CORONARY ARTERY BYPASS GRAFT  2000   4 vessel  . DILATION AND CURETTAGE OF UTERUS    . ENDARTERECTOMY Left 05/19/2013   Procedure: ENDARTERECTOMY CAROTID-LEFT;  Surgeon: Elam Dutch, MD;  Location: [facility];  Service: Vascular;  Laterality: Left;  . EYE SURGERY Bilateral    Cataract  . HEMORRHOID SURGERY    . LEFT HEART CATH  Dec. 2, 2014   With  Coronary Graft  Angiogram  . LEFT HEART CATHETERIZATION WITH CORONARY/GRAFT ANGIOGRAM  08/10/2013   Procedure: LEFT HEART CATHETERIZATION WITH Beatrix Fetters;  Surgeon: Peter M Martinique, MD;  Location: [facility] CATH LAB;  Service: Cardiovascular;;  . PARTIAL HYSTERECTOMY    . RECTOCELE REPAIR     x 2  . ROTATOR CUFF REPAIR    . TONSILLECTOMY    . US ECHOCARDIOGRAPHY  01/17/2005   EF 55-60%  SOCIAL HISTORY:   Social History  Substance Use Topics  . Smoking status: Never Smoker  . Smokeless tobacco: Never Used  . Alcohol use No    FAMILY HISTORY:   Family History  Problem Relation Age of Onset  . Heart attack Father     X's 3  . Heart disease Father   . Heart disease Son     before age 30  . Cancer Brother   . Hypertension Mother   . Alzheimer's disease Mother     DRUG ALLERGIES:   Allergies  Allergen Reactions  . Furosemide Other (See Comments)    Patient prefers not to take med, weakness and frequent urination  . Penicillins Other (See Comments)    unknown    REVIEW OF SYSTEMS:  Unable to accurately obtain given  patient's mental status  MEDICATIONS AT HOME:   Prior to Admission medications   Medication Sig Start Date End Date Taking? Authorizing Provider  aspirin 81 MG EC tablet Take 81 mg by mouth daily.      Historical Provider, MD  atenolol (TENORMIN) 25 MG tablet Take 0.5 tablets (12.5 mg total) by mouth daily. 02/14/16   Minna Merritts, MD  BIOTIN PO Take 1 capsule by mouth daily.     Historical Provider, MD  digoxin (DIGOX) 0.125 MG tablet Take 1 tablet (125 mcg total) by mouth daily. 09/05/14   Darlin Coco, MD  isosorbide mononitrate (IMDUR) 30 MG 24 hr tablet TAKE 1 TABLET (30 MG TOTAL) BY MOUTH DAILY. 02/19/16   Minna Merritts, MD  levothyroxine (SYNTHROID, LEVOTHROID) 100 MCG tablet TAKE 1 TABLET (100 MCG TOTAL) BY MOUTH DAILY. 02/15/16   Tonia Ghent, MD  losartan (COZAAR) 25 MG tablet TAKE 1 TABLET (25 MG TOTAL) BY MOUTH DAILY. 03/20/16   Minna Merritts, MD  Multiple Vitamins-Minerals (PRESERVISION AREDS PO) Take 1 tablet by mouth daily.    Historical Provider, MD  nitroGLYCERIN (NITROSTAT) 0.4 MG SL tablet PLACE 1 TABLET UNDER THE TONGUE EVERY 5 MINUTES AS NEEDED FOR CHEST PAIN. 03/01/15   Darlin Coco, MD  OLANZapine (ZYPREXA) 2.5 MG tablet Take 1 tablet (2.5 mg total) by mouth at bedtime. 02/21/16   Mir Marry Guan, MD  pantoprazole (PROTONIX) 40 MG tablet Take 1 tablet (40 mg total) by mouth daily. 02/14/16   Minna Merritts, MD  simvastatin (ZOCOR) 40 MG tablet TAKE 1 TABLET (40 MG TOTAL) BY MOUTH EVERY EVENING. 05/01/15   Darlin Coco, MD  vitamin B-12 (CYANOCOBALAMIN) 1000 MCG tablet Take 1,000 mcg by mouth daily.     Historical Provider, MD      VITAL SIGNS:  Blood pressure (!) 184/80, pulse 98, temperature 98.1 F (36.7 C), temperature source Oral, resp. rate (!) 24, weight 59 kg (130 lb), SpO2 96 %.  PHYSICAL EXAMINATION:  VITAL SIGNS: Vitals:   04/22/16 1700 04/22/16 1800  BP:  (!) 184/80  Pulse: 95 98  Resp: (!) 21 (!) 24  Temp:     GENERAL:82  y.o.female currently in Mild acute distress.  HEAD: Normocephalic, atraumatic.  EYES: Pupils equal, round, reactive to light. Extraocular muscles intact. No scleral icterus.  MOUTH: Moist mucosal membrane. Dentition intact. No abscess noted.  EAR, NOSE, THROAT: Clear without exudates. No external lesions.  NECK: Supple. No thyromegaly. No nodules. No JVD.  PULMONARY: Clear to ascultation, without wheeze rails or rhonci. No use of accessory muscles, Good respiratory effort. good air entry bilaterally CHEST: Nontender to palpation.  CARDIOVASCULAR: S1 and S2. Regular  rate and rhythm. No murmurs, rubs, or gallops. No edema. Pedal pulses 2+ bilaterally.  GASTROINTESTINAL: Soft, nontender, nondistended. No masses. Positive bowel sounds. No hepatosplenomegaly.  MUSCULOSKELETAL: No swelling, clubbing, or edema. Range of motion full in all extremities.  NEUROLOGIC: Cranial nerves II through XII are intact. No gross focal neurological deficits. Sensation intact. Reflexes intact.  SKIN: No ulceration, lesions, rashes, or cyanosis. Skin warm and dry. Turgor intact.  PSYCHIATRIC: Mood, affect flat. The patient is awake, alert and oriented self only. Insight, judgment poor at this time.    LABORATORY PANEL:   CBC  Recent Labs Lab 04/22/16 1602  WBC 12.9*  HGB 12.5  HCT 35.8  PLT 182   ------------------------------------------------------------------------------------------------------------------  Chemistries   Recent Labs Lab 04/22/16 1602  NA 124*  K 5.9*  CL 90*  CO2 24  GLUCOSE 90  BUN 24*  CREATININE 1.02*  CALCIUM 9.5  AST 50*  ALT 29  ALKPHOS 65  BILITOT 1.1   ------------------------------------------------------------------------------------------------------------------  Cardiac Enzymes  Recent Labs Lab 04/22/16 1602  TROPONINI <0.03   ------------------------------------------------------------------------------------------------------------------  RADIOLOGY:   Ct Head Wo Contrast  Result Date: 04/22/2016 CLINICAL DATA:  Confusion EXAM: CT HEAD WITHOUT CONTRAST TECHNIQUE: Contiguous axial images were obtained from the base of the skull through the vertex without intravenous contrast. COMPARISON:  02/19/2016 FINDINGS: Bony calvarium is intact. Diffuse atrophic changes are noted. Mild chronic white matter ischemic change is seen. No findings to suggest acute hemorrhage, acute infarction or space-occupying mass lesion are noted. IMPRESSION: Chronic atrophic and ischemic changes without acute abnormality. Electronically Signed   By: Inez Catalina M.D.   On: 04/22/2016 16:24   Dg Chest Portable 1 View  Result Date: 04/22/2016 CLINICAL DATA:  Altered mental status. EXAM: PORTABLE CHEST 1 VIEW COMPARISON:  Chest radiograph 04/04/2016 FINDINGS: Shallow lung inflation. Cardiomediastinal contours are unchanged. No pleural effusion or pneumothorax. Diffusely increased lung markings are unchanged. No focal airspace consolidation. No evidence of pulmonary edema. Mild right hilar prominence is similar to the prior study. IMPRESSION: No focal airspace disease. Electronically Signed   By: Ulyses Jarred M.D.   On: 04/22/2016 18:27    EKG:   Orders placed or performed during the hospital encounter of 04/22/16  . ED EKG  . ED EKG    IMPRESSION AND PLAN:   82 year old Caucasian female history of hypertension essential who is presenting with altered mental status.  1. Hyponatremia: Gentle IV fluid hydration sodium deficit of around 250, follow sodium levels 2. Hyperkalemia: Give Kayexalate follow potassium level III. Essential hypertension: Atenolol 4. Hypothyroidism unspecified Synthroid 5. Hyperlipidemia unspecified statin therapy 6. GERD without esophagitis: PPI    All the records are reviewed and case discussed with ED provider. Management plans discussed with the patient, family and they are in agreement.  CODE STATUS: full  TOTAL TIME TAKING CARE OF  THIS PATIENT: 33 minutes.    Hower,  Karenann Cai.D on 04/22/2016 at 6:35 PM  Between 7am to 6pm - Pager - [phone]  After 6pm: House Pager: - [phone]  [facility]  Office  [phone]  CC: Primary care physician; Elsie Stain, MD

## 2016-04-22 NOTE — ED Notes (Signed)
Patient transported to CT 

## 2016-04-22 NOTE — Progress Notes (Signed)
Anticoagulation monitoring(Lovenox):  82 yo  female ordered Lovenox 40 mg Q24h  Filed Weights   04/22/16 1554  Weight: 130 lb (59 kg)   BMI    Lab Results  Component Value Date   CREATININE 1.02 (H) 04/22/2016   CREATININE 0.90 04/16/2016   CREATININE 1.05 04/04/2016   Estimated Creatinine Clearance: 27.9 mL/min (by C-G formula based on SCr of 1.02 mg/dL). Hemoglobin & Hematocrit     Component Value Date/Time   HGB 12.5 04/22/2016 1602   HCT 35.8 04/22/2016 1602     Per Protocol for Patient with estCrcl < 30 ml/min and BMI < 40, will transition to Lovenox 30 mg Q24h.

## 2016-04-23 LAB — URINE CULTURE: CULTURE: NO GROWTH

## 2016-04-23 LAB — BASIC METABOLIC PANEL
ANION GAP: 5 (ref 5–15)
BUN: 19 mg/dL (ref 6–20)
CALCIUM: 8.1 mg/dL — AB (ref 8.9–10.3)
CO2: 29 mmol/L (ref 22–32)
Chloride: 94 mmol/L — ABNORMAL LOW (ref 101–111)
Creatinine, Ser: 0.9 mg/dL (ref 0.44–1.00)
GFR, EST NON AFRICAN AMERICAN: 53 mL/min — AB (ref >60)
GLUCOSE: 91 mg/dL (ref 65–99)
POTASSIUM: 3.7 mmol/L (ref 3.5–5.1)
Sodium: 128 mmol/L — ABNORMAL LOW (ref 135–145)

## 2016-04-23 MED ORDER — ENSURE ENLIVE PO LIQD
237.0000 mL | Freq: Two times a day (BID) | ORAL | Status: DC
  Administered 2016-04-23 – 2016-04-25 (×4): 237 mL via ORAL

## 2016-04-23 MED ORDER — ENOXAPARIN SODIUM 40 MG/0.4ML ~~LOC~~ SOLN
40.0000 mg | SUBCUTANEOUS | Status: DC
  Administered 2016-04-23 – 2016-04-24 (×2): 40 mg via SUBCUTANEOUS
  Filled 2016-04-23 (×2): qty 0.4

## 2016-04-23 NOTE — Care Management (Signed)
Admitted to [facility] with the diagnosis of hyponatremia. Lives alone. Sons are [name] and Joneen Caraway [phone]). Last seen Dr, Damita Dunnings last week. Home Health in the past. Doesn't remember name of agency. No skilled facility. No home oxygen. Cane and rolling walker in the home, but doesn't use them. Takes are of all basic and instrumental activities of daily living herself, drives. Fell last Saturday. Fair appetite. Prescriptions are filled at [facility] and [facility]. Family will transport. Shelbie Ammons RN MSN CCM Care Management [phone]

## 2016-04-23 NOTE — Care Management Important Message (Signed)
Important Message  Patient Details  Name: Jill Howell MRN: [id number] Date of Birth: 1919/02/09   Medicare Important Message Given:  Yes    Shelbie Ammons, RN 04/23/2016, 9:40 AM

## 2016-04-23 NOTE — Progress Notes (Signed)
Initial Nutrition Assessment  DOCUMENTATION CODES:   Not applicable  INTERVENTION:  -Recommend Ensure Enlive po BID, each supplement provides 350 kcal and 20 grams of protein -Cater to pt preferences; pt does not like heart healthy diet and Mrs.DASH; Vavrek benefit from liberalizing diet  NUTRITION DIAGNOSIS:   No nutrition diagnosis at this time  GOAL:   Patient will meet greater than or equal to 90% of their needs  MONITOR:   PO intake, Supplement acceptance, Labs, Weight trends  REASON FOR ASSESSMENT:   Malnutrition Screening Tool    ASSESSMENT:    82 yo female admitted with AMS with hyponatremia and hyperkalemia  Pt reports good appetite at home; pt lives along and does her own cooking, although recently has not felt like cooking. Pt does reports that her son prepares food and has been bringing meals to her (pt reports he is a "good cook."). Pt also tries to drink at least 1 Boost/Ensure per day at home. Weight has been stable  Nutrition-Focused physical exam completed. Findings are WDL for fat depletion, muscle depletion, and edema.    Past Medical History:  Diagnosis Date  . Allergic rhinitis, cause unspecified   . Anemia   . Asthma   . Atrial fibrillation ([place])    a. remote->no anticoagulation 2/2 h/o falls.  [name] CAD (coronary artery disease)    a. 2000 s/p MI/CABG x 4 (VG-PDA, VG-OM, VG-Diag, LIMA-LAD);  b. 2011 MI;  c. 08/2013 NSTEMI/Cath: LM 90-95, LAD 90/100p, LCX 99p, 140m, RCA 100ost, VG-PDA nl, VG-OM 100 (culprit w/ thrombus), VG-Diag nl, LIMA-LAD nl, EF 55-60%, mild inf HK, mod MR-->Med Rx.  . Carotid arterial disease ([place])    a. 05/2013 s/p L CEA.  . Chronic diastolic CHF (congestive heart failure) ([place])    a. 08/2013 Echo: EF 50-55%, no rwma, Gr2 DD, mod MR, mildly dil LA, PASP 54mmHg.  [name] Esophageal reflux   . History of TIA (transient ischemic attack)   . Hyperlipidemia   . Hypertension   . Hypothyroidism   . Myocardial infarction ([place])    08-2013   . Type II or unspecified type diabetes mellitus without mention of complication, not stated as uncontrolled   . Unspecified hearing loss     Diet Order:  Diet Heart Room service appropriate? Yes; Fluid consistency: Thin   Energy Intake: recorded po intake 50% at breakfast this AM; for lunch today, pt ate most of the chicken, all of the mashed potatoes and peaches and drank all of the milk. Pt reports appetite is good  Skin:  Reviewed, no issues  Last BM:  no documented BM   Labs: sodium 128, potassium wdl  Meds:  NS at 75 ml/hr  Height:   Ht Readings from Last 1 Encounters:  04/22/16 5\' 7"  (1.702 m)    Weight:   Wt Readings from Last 1 Encounters:  04/22/16 127 lb 4.8 oz (57.7 kg)   Wt Readings from Last 10 Encounters:  04/22/16 127 lb 4.8 oz (57.7 kg)  04/16/16 122 lb (55.3 kg)  04/04/16 123 lb 3.2 oz (55.9 kg)  03/04/16 125 lb 8 oz (56.9 kg)  02/21/16 119 lb 3.2 oz (54.1 kg)  01/29/16 126 lb (57.2 kg)  01/15/16 125 lb (56.7 kg)  09/15/15 123 lb 12.8 oz (56.2 kg)  06/29/15 126 lb 6.4 oz (57.3 kg)  06/15/15 124 lb (56.2 kg)    BMI:  Body mass index is 19.94 kg/m.  Estimated Nutritional Needs:   Kcal:  1445-1730 kcals  Protein:  70-85 g  Fluid:  >/= 1.5 L  EDUCATION NEEDS:   Education needs no appropriate at this time  [place], [place], [place] [phone] Pager  [phone] Weekend/On-Call Pager

## 2016-04-23 NOTE — NC FL2 (Signed)
[place] LEVEL OF CARE SCREENING TOOL     IDENTIFICATION  Patient Name: Jill Howell Birthdate: April 23, 1919 Sex: female Admission Date (Current Location): 04/22/2016  [place] and [place] Number:  Engineering geologist and Address:  [facility], [address], [place], [place] [address]      Provider Number: [id number]  Attending Physician Name and Address:  Henreitta Leber, MD  Relative Name and Phone Number:       Current Level of Care: Hospital Recommended Level of Care: [place] Prior Approval Number:    Date Approved/Denied:   PASRR Number:  ( [id number] )  Discharge Plan: SNF    Current Diagnoses: Patient Active Problem List   Diagnosis Date Noted  . Hyperkalemia 04/22/2016  . Pressure ulcer 02/20/2016  . CAD (coronary artery disease) 02/19/2016  . Dementia 02/19/2016  . Hyponatremia 02/19/2016  . Diastolic CHF, chronic ([place]) 02/19/2016  . Carotid stenosis 06/09/2014  . Family discord 02/18/2014  . Aftercare following surgery of the circulatory system, [place] 12/07/2013  . Malaise and fatigue 12/01/2013  . Benign hypertensive heart disease without heart failure 08/17/2013  . At risk for falls 08/12/2013  . Anemia   . TIA (transient ischemic attack) 05/17/2013  . Occlusion and stenosis of carotid artery without mention of cerebral infarction 05/13/2013  . Fall at home 05/11/2013  . PAF (paroxysmal atrial fibrillation) ([place]) 08/11/2012  . Carotid artery disease ([place]) 08/19/2011  . Dermatitis 05/09/2011  . HYPOTHYROIDISM 09/15/2007  . History of diabetes mellitus, type II 09/15/2007  . Hyperlipidemia 09/15/2007  . HEARING IMPAIRMENT 09/15/2007  . Coronary atherosclerosis 09/15/2007  . Congestive heart failure ([place]) 09/15/2007  . ALLERGIC RHINITIS 09/15/2007  . GERD 09/15/2007    Orientation RESPIRATION BLADDER Height & Weight     Self, Place  Normal Incontinent Weight: 127 lb 4.8 oz (57.7  kg) Height:  5\' 7"  (170.2 cm)  BEHAVIORAL SYMPTOMS/MOOD NEUROLOGICAL BOWEL NUTRITION STATUS   (none )  (none) Continent Diet (Diet: Heart Healthy )  AMBULATORY STATUS COMMUNICATION OF NEEDS Skin   Extensive Assist Verbally Normal                       Personal Care Assistance Level of Assistance  Bathing, Feeding, Dressing Bathing Assistance: Limited assistance Feeding assistance: Independent Dressing Assistance: Limited assistance     Functional Limitations Info  Sight, Hearing, Speech Sight Info: Adequate Hearing Info: Impaired Speech Info: Adequate    SPECIAL CARE FACTORS FREQUENCY  PT (By licensed PT)     PT Frequency:  (5)              Contractures      Additional Factors Info  Code Status, Allergies Code Status Info:  (Full Code. ) Allergies Info:  (Furosemide, Penicillins)           Current Medications (04/23/2016):  This is the current hospital active medication list Current Facility-Administered Medications  Medication Dose Route Frequency Provider Last Rate Last Dose  . 0.9 %  sodium chloride infusion   Intravenous Continuous Lytle Butte, MD 75 mL/hr at 04/23/16 1042    . acetaminophen (TYLENOL) tablet 650 mg  650 mg Oral Q6H PRN Lytle Butte, MD       Or  . acetaminophen (TYLENOL) suppository 650 mg  650 mg Rectal Q6H PRN Lytle Butte, MD      . aspirin EC tablet 81 mg  81 mg Oral Daily Aaron Mose  Hower, MD   81 mg at 04/23/16 0810  . atenolol (TENORMIN) tablet 12.5 mg  12.5 mg Oral Daily Lytle Butte, MD   12.5 mg at 04/23/16 [id number]  . digoxin (LANOXIN) tablet 125 mcg  125 mcg Oral Daily Lytle Butte, MD   125 mcg at 04/23/16 0810  . enoxaparin (LOVENOX) injection 40 mg  40 mg Subcutaneous Q24H Sheema M Hallaji, RPH      . hydrALAZINE (APRESOLINE) injection 10 mg  10 mg Intravenous Q4H PRN Lytle Butte, MD      . isosorbide mononitrate (IMDUR) 24 hr tablet 30 mg  30 mg Oral Daily Lytle Butte, MD   30 mg at 04/23/16 [id number]  . levothyroxine  (SYNTHROID, LEVOTHROID) tablet 100 mcg  100 mcg Oral QAC breakfast Lytle Butte, MD   100 mcg at 04/23/16 0810  . OLANZapine (ZYPREXA) tablet 2.5 mg  2.5 mg Oral QHS Lytle Butte, MD   2.5 mg at 04/23/16 0120  . oxyCODONE (Oxy IR/ROXICODONE) immediate release tablet 5 mg  5 mg Oral Q4H PRN Lytle Butte, MD   5 mg at 04/22/16 2121  . pantoprazole (PROTONIX) EC tablet 40 mg  40 mg Oral QAC breakfast Lytle Butte, MD   40 mg at 04/23/16 [id number]  . simvastatin (ZOCOR) tablet 40 mg  40 mg Oral q1800 Lytle Butte, MD      . vitamin B-12 (CYANOCOBALAMIN) tablet 1,000 mcg  1,000 mcg Oral Daily Lytle Butte, MD   1,000 mcg at 04/23/16 [id number]     Discharge Medications: Please see discharge summary for a list of discharge medications.  Relevant Imaging Results:  Relevant Lab Results:   Additional Information  (SSN: [id number])  Seger Jani, Veronia Beets, LCSW

## 2016-04-23 NOTE — Clinical Social Work Placement (Signed)
   CLINICAL SOCIAL WORK PLACEMENT  NOTE  Date:  04/23/2016  Patient Details  Name: Jill Howell MRN: [id number] Date of Birth: June 14, 1919  Clinical Social Work is seeking post-discharge placement for this patient at the Central level of care (*CSW will initial, date and re-position this form in  chart as items are completed):  Yes   Patient/family provided with [facility] Work Department's list of facilities offering this level of care within the geographic area requested by the patient (or if unable, by the patient's family).  Yes   Patient/family informed of their freedom to choose among providers that offer the needed level of care, that participate in Medicare, Medicaid or managed care program needed by the patient, have an available bed and are willing to accept the patient.  Yes   Patient/family informed of [facility]'s ownership interest in [facility] and [facility], as well as of the fact that they are under no obligation to receive care at these facilities.  PASRR submitted to EDS on       PASRR number received on       Existing PASRR number confirmed on 04/23/16     FL2 transmitted to all facilities in geographic area requested by pt/family on 04/23/16     FL2 transmitted to all facilities within larger geographic area on       Patient informed that his/her managed care company has contracts with or will negotiate with certain facilities, including the following:            Patient/family informed of bed offers received.  Patient chooses bed at       Physician recommends and patient chooses bed at      Patient to be transferred to   on  .  Patient to be transferred to facility by       Patient family notified on   of transfer.  Name of family member notified:        PHYSICIAN       Additional Comment:    _______________________________________________ Daundre Biel, Veronia Beets, LCSW 04/23/2016, 1:45 PM

## 2016-04-23 NOTE — Evaluation (Signed)
Physical Therapy Evaluation Patient Details Name: Jill Howell MRN: [id number] DOB: Oct 17, 1918 Today's Date: 04/23/2016   History of Present Illness  presented to ER secondary to AMS; admitted with hyponatremia and hypokalemia (current NA 128, K 3.7).  PMH significant for asthma, Afib, CAD s/p CABG, TIA, MI, DM  Clinical Impression  Upon evaluation, patient alert and oriented to self, location and general situation; disoriented to date with noted deficits in STM.  Bilat UE/LE strength generally weak and deconditioned; standing balance poor, requiring min assist from therapist with standing activities to recover multiple posterior LOB/prevent fall.  Patient with limited insight/awareness of deficits; generally impulsive and at high fall risk as result.  Does improve to cga/min assist with use of RW; do recommend continued use of RW with all mobility at this time. Would benefit from skilled PT to address above deficits and promote optimal return to PLOF; recommend transition to STR upon discharge from acute hospitalization, as patient unsafe to return home alone.  Will continue to monitor progress as medical status improves.     Follow Up Recommendations SNF;Supervision/Assistance - 24 hour      Equipment Recommendations       Recommendations for Other Services       Precautions / Restrictions Precautions Precautions: Fall Restrictions Weight Bearing Restrictions: No      Mobility  Bed Mobility Overal bed mobility: Needs Assistance Bed Mobility: Supine to Sit     Supine to sit: Min assist        Transfers Overall transfer level: Needs assistance Equipment used: Rolling walker (2 wheeled) Transfers: Sit to/from Stand Sit to Stand: Min assist         General transfer comment: requires multiple attempts and use of bilat UEs to complete; decreased power apparent in bilat LEs, tends to maintain post pelvic tilt throughout movement transition  Ambulation/Gait Ambulation/Gait  assistance: Min assist Ambulation Distance (Feet): 50 Feet Assistive device: None       General Gait Details: forward flexed posture with excessive forward weight shift; staggering and unsteady, min assist to prevent LOB  Stairs            Wheelchair Mobility    Modified Rankin (Stroke Patients Only)       Balance Overall balance assessment: Needs assistance Sitting-balance support: No upper extremity supported;Feet supported Sitting balance-Leahy Scale: Good     Standing balance support: No upper extremity supported Standing balance-Leahy Scale: Poor                               Pertinent Vitals/Pain Pain Assessment: No/denies pain    Home Living Family/patient expects to be discharged to:: Private residence Living Arrangements: Alone Available Help at Discharge: Family;Available PRN/intermittently (son lives behind patient and provides frequent check in) Type of Home: House Home Access: Stairs to enter Entrance Stairs-Rails: Left Entrance Stairs-Number of Steps: 2-3 Home Layout: One level Home Equipment: Walker - 2 wheels;Cane - single point;Bedside commode      Prior Function Level of Independence: Independent         Comments: Indep for ADLs, household and community mobility without assist device; + driving.  Does endorse several falls in recent months, two within the previous week.     Hand Dominance        Extremity/Trunk Assessment   Upper Extremity Assessment: Generalized weakness           Lower Extremity Assessment: Generalized weakness (grossly at least  3+ to 4-/5 throughout)         Communication   Communication: HOH  Cognition Arousal/Alertness: Awake/alert Behavior During Therapy: WFL for tasks assessed/performed Overall Cognitive Status: Impaired/Different from baseline (oriented to self, location, general situation; confused to date, limited STM)                      General Comments       Exercises Other Exercises Other Exercises: Toilet transfer, SPT without assist device, min assist; sit/stand from [facility], min assist.  2-3 posterior LOB with standing (during clothing management), requiring min assist from therapist to recover/prevent fall Other Exercises: 150' with RW, cga/min assist--improved fludity, safety and overall stability with use of RW.  Recommend continued use of RW with all mobility at this time.      Assessment/Plan    PT Assessment Patient needs continued PT services  PT Diagnosis Difficulty walking;Generalized weakness   PT Problem List Decreased strength;Decreased activity tolerance;Decreased balance;Decreased mobility;Decreased cognition;Decreased safety awareness;Decreased knowledge of precautions  PT Treatment Interventions DME instruction;Gait training;Stair training;Therapeutic activities;Functional mobility training;Therapeutic exercise;Balance training;Patient/family education   PT Goals (Current goals can be found in the Care Plan section) Acute Rehab PT Goals Patient Stated Goal: to get my strength up PT Goal Formulation: With patient Time For Goal Achievement: 05/07/16 Potential to Achieve Goals: Good    Frequency Min 2X/week   Barriers to discharge Decreased caregiver support      Co-evaluation               End of Session Equipment Utilized During Treatment: Gait belt Activity Tolerance: Patient tolerated treatment well Patient left: in chair;with call bell/phone within reach;with chair alarm set           Time: [id number] PT Time Calculation (min) (ACUTE ONLY): 25 min   Charges:   PT Evaluation $PT Eval Low Complexity: 1 Procedure PT Treatments $Gait Training: 8-22 mins   PT G Codes:        Jill Howell, PT, DPT, NCS 04/23/16, 10:30 AM [phone]
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
## 2016-04-23 NOTE — Progress Notes (Signed)
[place] at [place] NAME: Jill Howell    MR#:  [id number]  DATE OF BIRTH:  March 21, 1919  SUBJECTIVE:   Pt. Here due to AMS and noted to be hyponatremic.  Sodium has improved.  Remains a bit confused. Seem by PT and they recommend STR.    REVIEW OF SYSTEMS:    Review of Systems  Unable to perform ROS: Mental acuity    Nutrition: heart healthy Tolerating Diet: Yes Tolerating PT: Eval Noted.   DRUG ALLERGIES:   Allergies  Allergen Reactions  . Furosemide Other (See Comments)    Patient prefers not to take med, weakness and frequent urination  . Penicillins Other (See Comments)    unknown    VITALS:  Blood pressure (!) 115/44, pulse 62, temperature 98.2 F (36.8 C), temperature source Oral, resp. rate 20, height 5\' 7"  (1.702 m), weight 57.7 kg (127 lb 4.8 oz), SpO2 96 %.  PHYSICAL EXAMINATION:   Physical Exam  GENERAL:  82 y.o.-year-old patient lying in bed in no acute distress.  EYES: Pupils equal, round, reactive to light and accommodation. No scleral icterus. Extraocular muscles intact.  HEENT: Head atraumatic, normocephalic. Oropharynx and nasopharynx clear.  NECK:  Supple, no jugular venous distention. No thyroid enlargement, no tenderness.  LUNGS: Normal breath sounds bilaterally, no wheezing, rales, rhonchi. No use of accessory muscles of respiration.  CARDIOVASCULAR: S1, S2 normal. II/VI SEM at LSB, rubs, or gallops.  ABDOMEN: Soft, nontender, nondistended. Bowel sounds present. No organomegaly or mass.  EXTREMITIES: No cyanosis, clubbing or edema b/l.    NEUROLOGIC: Cranial nerves II through XII are intact. No focal Motor or sensory deficits b/l.  Globally weak. PSYCHIATRIC: The patient is alert and oriented x 1.  SKIN: No obvious rash, lesion, or ulcer.    LABORATORY PANEL:   CBC  Recent Labs Lab 04/22/16 1602  WBC 12.9*  HGB 12.5  HCT 35.8  PLT 182    ------------------------------------------------------------------------------------------------------------------  Chemistries   Recent Labs Lab 04/22/16 1602 04/23/16 0600  NA 124* 128*  K 5.9* 3.7  CL 90* 94*  CO2 24 29  GLUCOSE 90 91  BUN 24* 19  CREATININE 1.02* 0.90  CALCIUM 9.5 8.1*  AST 50*  --   ALT 29  --   ALKPHOS 65  --   BILITOT 1.1  --    ------------------------------------------------------------------------------------------------------------------  Cardiac Enzymes  Recent Labs Lab 04/22/16 1602  TROPONINI <0.03   ------------------------------------------------------------------------------------------------------------------  RADIOLOGY:  Ct Head Wo Contrast  Result Date: 04/22/2016 CLINICAL DATA:  Confusion EXAM: CT HEAD WITHOUT CONTRAST TECHNIQUE: Contiguous axial images were obtained from the base of the skull through the vertex without intravenous contrast. COMPARISON:  02/19/2016 FINDINGS: Bony calvarium is intact. Diffuse atrophic changes are noted. Mild chronic white matter ischemic change is seen. No findings to suggest acute hemorrhage, acute infarction or space-occupying mass lesion are noted. IMPRESSION: Chronic atrophic and ischemic changes without acute abnormality. Electronically Signed   By: Inez Catalina M.D.   On: 04/22/2016 16:24   Dg Chest Portable 1 View  Result Date: 04/22/2016 CLINICAL DATA:  Altered mental status. EXAM: PORTABLE CHEST 1 VIEW COMPARISON:  Chest radiograph 04/04/2016 FINDINGS: Shallow lung inflation. Cardiomediastinal contours are unchanged. No pleural effusion or pneumothorax. Diffusely increased lung markings are unchanged. No focal airspace consolidation. No evidence of pulmonary edema. Mild right hilar prominence is similar to the prior study. IMPRESSION: No focal airspace disease. Electronically Signed   By: Cletus Gash.D.  On: 04/22/2016 18:27     ASSESSMENT AND PLAN:   82 year old female with past  medical history of hypertension, DM, Dementia, Hypothyroidism, hx of TIA, Carotid Artery Disease, hx of CHF, who presented to the hospital due to AMS.  1. AMS - metabolic encephalopathy due to dementia complicated w/ underlying Hyponatremia.  - no infectious source. CT head (-). Sodium improving and follow mental status.   2. Hyponatremia - hypovolemic hypotonic in nature. -Continue gentle IV fluids. Sodium improving.  3. Hypothyroidism-continue Synthroid.  4. Essential hypertension-continue atenolol, Imdur, as needed hydralazine.  5. Hypokalemia-improved with Kayexalate.  6. Hyperlipidemia-continue Zocor.  Seen by physical therapy and recommended short-term rehabilitation. Social worker aware and working on placement.   All the records are reviewed and case discussed with Care Management/Social Workerr. Management plans discussed with the patient, family and they are in agreement.  CODE STATUS: Full  DVT Prophylaxis: Lovenox  TOTAL TIME TAKING CARE OF THIS PATIENT: 30 minutes.   POSSIBLE D/C IN 1-2 DAYS, DEPENDING ON CLINICAL CONDITION.   Henreitta Leber M.D on 04/23/2016 at 2:28 PM  Between 7am to 6pm - Pager - [phone]  After 6pm go to [email or web address] - password [email or web address] [facility]  Office  [phone]  CC: Primary care physician; Elsie Stain, MD

## 2016-04-24 LAB — BASIC METABOLIC PANEL
Anion gap: 4 — ABNORMAL LOW (ref 5–15)
BUN: 20 mg/dL (ref 6–20)
CALCIUM: 8.1 mg/dL — AB (ref 8.9–10.3)
CO2: 26 mmol/L (ref 22–32)
CREATININE: 0.84 mg/dL (ref 0.44–1.00)
Chloride: 102 mmol/L (ref 101–111)
GFR, EST NON AFRICAN AMERICAN: 57 mL/min — AB (ref >60)
Glucose, Bld: 98 mg/dL (ref 65–99)
Potassium: 4.3 mmol/L (ref 3.5–5.1)
SODIUM: 132 mmol/L — AB (ref 135–145)

## 2016-04-24 MED ORDER — POLYETHYLENE GLYCOL 3350 17 G PO PACK: 17.0000 g | PACK | Freq: Every day | ORAL | Status: DC | PRN

## 2016-04-24 MED ORDER — DOCUSATE SODIUM 100 MG PO CAPS
100.0000 mg | ORAL_CAPSULE | Freq: Two times a day (BID) | ORAL | Status: DC
  Administered 2016-04-25: 100 mg via ORAL
  Filled 2016-04-24 (×3): qty 1

## 2016-04-24 NOTE — Progress Notes (Signed)
CSW presented bed offers to patient. Patient is confused and is oriented to self. She granted CSW verbal permission to contact her son [name]. CSW contacted [name] [phone]. Presented bed offers. Accepted a bed offer at [facility]. Stated they are not interested in EMS transport at discharge. Stated family will transport. CSW contacted [employer]- Development worker, international aid at [facility] and informed him of above. Also, informed him that patient will possibly discharge tomorrow 04/25/16.  Ernest Pine, MSW, LCSW, [place] Clinical Social Worker [phone]

## 2016-04-24 NOTE — Plan of Care (Signed)
Problem: Nutrition: Goal: Adequate nutrition will be maintained Outcome: Progressing Discussed importance of Ensure supplements, pt stated she prefers chocolate flavor.  Problem: Bowel/Gastric: Goal: Will not experience complications related to bowel motility Outcome: Progressing Pt requested laxative/stool softener for constipation.  Pt states hasn't had BM since admission 8/14.  Dr. Verdell Carmine paged.

## 2016-04-24 NOTE — Progress Notes (Signed)
[place] at [place] NAME: Jill Howell    MR#:  [id number]  DATE OF BIRTH:  10/27/1918  SUBJECTIVE:   Pt. Here due to AMS and noted to be hyponatremic.  Sodium much improved and normalized today.  Mental status improved.  Will d/c to SNF tomorrow.   REVIEW OF SYSTEMS:    Review of Systems  Constitutional: Negative for chills and fever.  HENT: Negative for congestion and tinnitus.   Eyes: Negative for blurred vision and double vision.  Respiratory: Negative for cough, shortness of breath and wheezing.   Cardiovascular: Negative for chest pain, orthopnea and PND.  Gastrointestinal: Negative for abdominal pain, diarrhea, nausea and vomiting.  Genitourinary: Negative for dysuria and hematuria.  Neurological: Negative for dizziness, sensory change and focal weakness.  All other systems reviewed and are negative.   Nutrition: heart healthy Tolerating Diet: Yes Tolerating PT: Eval Noted.   DRUG ALLERGIES:   Allergies  Allergen Reactions  . Furosemide Other (See Comments)    Patient prefers not to take med, weakness and frequent urination  . Penicillins Other (See Comments)    unknown    VITALS:  Blood pressure (!) 143/40, pulse 65, temperature 98.7 F (37.1 C), resp. rate 20, height 5\' 7"  (1.702 m), weight 57.7 kg (127 lb 4.8 oz), SpO2 97 %.  PHYSICAL EXAMINATION:   Physical Exam  GENERAL:  82 y.o.-year-old patient lying in bed in no acute distress.  EYES: Pupils equal, round, reactive to light and accommodation. No scleral icterus. Extraocular muscles intact.  HEENT: Head atraumatic, normocephalic. Oropharynx and nasopharynx clear.  NECK:  Supple, no jugular venous distention. No thyroid enlargement, no tenderness.  LUNGS: Normal breath sounds bilaterally, no wheezing, rales, rhonchi. No use of accessory muscles of respiration.  CARDIOVASCULAR: S1, S2 normal. II/VI SEM at LSB, rubs, or gallops.  ABDOMEN: Soft, nontender,  nondistended. Bowel sounds present. No organomegaly or mass.  EXTREMITIES: No cyanosis, clubbing or edema b/l.    NEUROLOGIC: Cranial nerves II through XII are intact. No focal Motor or sensory deficits b/l.  Globally weak. PSYCHIATRIC: The patient is alert and oriented x 2.  SKIN: No obvious rash, lesion, or ulcer.    LABORATORY PANEL:   CBC  Recent Labs Lab 04/22/16 1602  WBC 12.9*  HGB 12.5  HCT 35.8  PLT 182   ------------------------------------------------------------------------------------------------------------------  Chemistries   Recent Labs Lab 04/22/16 1602  04/24/16 0412  NA 124*  < > 132*  K 5.9*  < > 4.3  CL 90*  < > 102  CO2 24  < > 26  GLUCOSE 90  < > 98  BUN 24*  < > 20  CREATININE 1.02*  < > 0.84  CALCIUM 9.5  < > 8.1*  AST 50*  --   --   ALT 29  --   --   ALKPHOS 65  --   --   BILITOT 1.1  --   --   < > = values in this interval not displayed. ------------------------------------------------------------------------------------------------------------------  Cardiac Enzymes  Recent Labs Lab 04/22/16 1602  TROPONINI <0.03   ------------------------------------------------------------------------------------------------------------------  RADIOLOGY:  Ct Head Wo Contrast  Result Date: 04/22/2016 CLINICAL DATA:  Confusion EXAM: CT HEAD WITHOUT CONTRAST TECHNIQUE: Contiguous axial images were obtained from the base of the skull through the vertex without intravenous contrast. COMPARISON:  02/19/2016 FINDINGS: Bony calvarium is intact. Diffuse atrophic changes are noted. Mild chronic white matter ischemic change is seen. No findings to suggest  acute hemorrhage, acute infarction or space-occupying mass lesion are noted. IMPRESSION: Chronic atrophic and ischemic changes without acute abnormality. Electronically Signed   By: Inez Catalina M.D.   On: 04/22/2016 16:24   Dg Chest Portable 1 View  Result Date: 04/22/2016 CLINICAL DATA:  Altered mental  status. EXAM: PORTABLE CHEST 1 VIEW COMPARISON:  Chest radiograph 04/04/2016 FINDINGS: Shallow lung inflation. Cardiomediastinal contours are unchanged. No pleural effusion or pneumothorax. Diffusely increased lung markings are unchanged. No focal airspace consolidation. No evidence of pulmonary edema. Mild right hilar prominence is similar to the prior study. IMPRESSION: No focal airspace disease. Electronically Signed   By: Ulyses Jarred M.D.   On: 04/22/2016 18:27     ASSESSMENT AND PLAN:   82 year old female with past medical history of hypertension, DM, Dementia, Hypothyroidism, hx of TIA, Carotid Artery Disease, hx of CHF, who presented to the hospital due to [place].  1. AMS - metabolic encephalopathy due to dementia complicated w/ underlying Hyponatremia.  - Mental status much improved and back to baseline. Sodium normalized today. - no infectious source. CT head (-). Sodium improving and follow mental status.   2. Hyponatremia - hypovolemic hypotonic in nature. - improved w/ IV fluids and now normalized.    3. Hypothyroidism-continue Synthroid.  4. Essential hypertension-continue atenolol, Imdur,  - BP improved.    5. Hyperkalemia-improved with Kayexalate.  6. Hyperlipidemia-continue Zocor.  Seen by physical therapy and recommended short-term rehabilitation. D/c to Rehab tomorrow.   All the records are reviewed and case discussed with Care Management/Social Workerr. Management plans discussed with the patient, family and they are in agreement.  CODE STATUS: Full  DVT Prophylaxis: Lovenox  TOTAL TIME TAKING CARE OF THIS PATIENT: 25 minutes.   POSSIBLE D/C IN 1-2 DAYS, DEPENDING ON CLINICAL CONDITION.   Henreitta Leber M.D on 04/24/2016 at 2:43 PM  Between 7am to 6pm - Pager - [phone]  After 6pm go to [email or web address] - password [email or web address]  Office  [phone]  CC: Primary care physician; Elsie Stain, MD

## 2016-04-25 DIAGNOSIS — F39 Unspecified mood [affective] disorder: Secondary | ICD-10-CM | POA: Diagnosis not present

## 2016-04-25 DIAGNOSIS — E039 Hypothyroidism, unspecified: Secondary | ICD-10-CM | POA: Diagnosis not present

## 2016-04-25 DIAGNOSIS — R4182 Altered mental status, unspecified: Secondary | ICD-10-CM | POA: Diagnosis not present

## 2016-04-25 DIAGNOSIS — E539 Vitamin B deficiency, unspecified: Secondary | ICD-10-CM | POA: Diagnosis not present

## 2016-04-25 DIAGNOSIS — Z7982 Long term (current) use of aspirin: Secondary | ICD-10-CM | POA: Diagnosis not present

## 2016-04-25 DIAGNOSIS — I509 Heart failure, unspecified: Secondary | ICD-10-CM | POA: Diagnosis not present

## 2016-04-25 DIAGNOSIS — I2510 Atherosclerotic heart disease of native coronary artery without angina pectoris: Secondary | ICD-10-CM | POA: Diagnosis not present

## 2016-04-25 DIAGNOSIS — F0391 Unspecified dementia with behavioral disturbance: Secondary | ICD-10-CM | POA: Diagnosis not present

## 2016-04-25 DIAGNOSIS — I10 Essential (primary) hypertension: Secondary | ICD-10-CM | POA: Diagnosis not present

## 2016-04-25 DIAGNOSIS — E875 Hyperkalemia: Secondary | ICD-10-CM | POA: Diagnosis not present

## 2016-04-25 DIAGNOSIS — J45901 Unspecified asthma with (acute) exacerbation: Secondary | ICD-10-CM | POA: Diagnosis not present

## 2016-04-25 DIAGNOSIS — G9340 Encephalopathy, unspecified: Secondary | ICD-10-CM | POA: Diagnosis not present

## 2016-04-25 DIAGNOSIS — E785 Hyperlipidemia, unspecified: Secondary | ICD-10-CM | POA: Diagnosis not present

## 2016-04-25 DIAGNOSIS — Z8673 Personal history of transient ischemic attack (TIA), and cerebral infarction without residual deficits: Secondary | ICD-10-CM | POA: Diagnosis not present

## 2016-04-25 DIAGNOSIS — F0390 Unspecified dementia without behavioral disturbance: Secondary | ICD-10-CM | POA: Diagnosis not present

## 2016-04-25 DIAGNOSIS — K219 Gastro-esophageal reflux disease without esophagitis: Secondary | ICD-10-CM | POA: Diagnosis not present

## 2016-04-25 DIAGNOSIS — E871 Hypo-osmolality and hyponatremia: Secondary | ICD-10-CM | POA: Diagnosis not present

## 2016-04-25 DIAGNOSIS — I4891 Unspecified atrial fibrillation: Secondary | ICD-10-CM | POA: Diagnosis not present

## 2016-04-25 DIAGNOSIS — E119 Type 2 diabetes mellitus without complications: Secondary | ICD-10-CM | POA: Diagnosis not present

## 2016-04-25 NOTE — Progress Notes (Signed)
Patient is alert and oriented, forgetfulness at times, up to bathroom with stand by assist and walker, good appetite, on room air, bm during shift, pt is d/c to [facility] for rehab, denies pain, report given to [facility], patient d/c via son, son updated at bedside. Sodium serum improved to 132. Pt pushed to visitor entrance via volunteer staff, uneventful shift.

## 2016-04-25 NOTE — Discharge Summary (Signed)
[facility] [place] NAME: Jill Howell    MR#:  [id number]  DATE OF BIRTH:  [birth year]  DATE OF ADMISSION:  04/22/2016 ADMITTING PHYSICIAN: Lytle Butte, MD  DATE OF DISCHARGE: 04/25/2016 = PRIMARY CARE PHYSICIAN: Elsie Stain, MD    ADMISSION DIAGNOSIS:  Hyponatremia [E87.1] Altered mental status, unspecified altered mental status type [R41.82]  DISCHARGE DIAGNOSIS:  Active Problems:   Hyponatremia   Hyperkalemia   SECONDARY DIAGNOSIS:   Past Medical History:  Diagnosis Date  . Allergic rhinitis, cause unspecified   . Anemia   . Asthma   . Atrial fibrillation ([place])    a. remote->no anticoagulation 2/2 h/o falls.  [name] CAD (coronary artery disease)    a. 2000 s/p MI/CABG x 4 (VG-PDA, VG-OM, VG-Diag, LIMA-LAD);  b. 2011 MI;  c. 08/2013 NSTEMI/Cath: LM 90-95, LAD 90/100p, LCX 99p, 174m, RCA 100ost, VG-PDA nl, VG-OM 100 (culprit w/ thrombus), VG-Diag nl, LIMA-LAD nl, EF 55-60%, mild inf HK, mod MR-->Med Rx.  . Carotid arterial disease ([place])    a. 05/2013 s/p L CEA.  . Chronic diastolic CHF (congestive heart failure) ([place])    a. 08/2013 Echo: EF 50-55%, no rwma, Gr2 DD, mod MR, mildly dil LA, PASP 28mmHg.  [name] Esophageal reflux   . History of TIA (transient ischemic attack)   . Hyperlipidemia   . Hypertension   . Hypothyroidism   . Myocardial infarction ([place])    08-2013  . Type II or unspecified type diabetes mellitus without mention of complication, not stated as uncontrolled   . Unspecified hearing loss     HOSPITAL COURSE:   82 year old female with past medical history of hypertension, DM, Dementia, Hypothyroidism, hx of TIA, Carotid Artery Disease, hx of CHF, who presented to the hospital due to [place].  1. AMS - metabolic encephalopathy due to dementia complicated w/ underlying Hyponatremia.  - pt. Was hydrated with IV fluids and her mental status has improved and back to baseline as her sodium has normalized.  - no infectious  source. CT head was (-) on admission.    2. Hyponatremia - hypovolemic hypotonic in nature. - pt. Received some IV fluids and her sodium has now normalized.    3. Hypothyroidism- she willcontinue Synthroid.  4. Essential hypertension- she will continue atenolol, Imdur, Losartan.  - BP stable   5. Hyperkalemia- this has improved w/ Kayexylate and now normalized.   6. Hyperlipidemia- she will continue Zocor.  Pt. Was seen by PT and they recommended SNF and she is being discharged there presently.   DISCHARGE CONDITIONS:   Stable.   CONSULTS OBTAINED:  Treatment Team:  Lytle Butte, MD  DRUG ALLERGIES:   Allergies  Allergen Reactions  . Furosemide Other (See Comments)    Patient prefers not to take med, weakness and frequent urination  . Penicillins Other (See Comments)    unknown    DISCHARGE MEDICATIONS:     Medication List    TAKE these medications   aspirin 81 MG EC tablet Take 81 mg by mouth daily.   atenolol 25 MG tablet Commonly known as:  TENORMIN Take 0.5 tablets (12.5 mg total) by mouth daily.   BIOTIN PO Take 1 capsule by mouth daily.   digoxin 0.125 MG tablet Commonly known as:  DIGOX Take 1 tablet (125 mcg total) by mouth daily.   isosorbide mononitrate 30 MG 24 hr tablet Commonly known as:  IMDUR Take 30 mg by mouth daily.   levothyroxine  100 MCG tablet Commonly known as:  SYNTHROID, LEVOTHROID Take 100 mcg by mouth daily before breakfast.   losartan 25 MG tablet Commonly known as:  COZAAR Take 25 mg by mouth daily.   nitroGLYCERIN 0.4 MG SL tablet Commonly known as:  NITROSTAT PLACE 1 TABLET UNDER THE TONGUE EVERY 5 MINUTES AS NEEDED FOR CHEST PAIN.   pantoprazole 40 MG tablet Commonly known as:  PROTONIX Take 1 tablet (40 mg total) by mouth daily.   PRESERVISION AREDS PO Take 1 tablet by mouth daily.   simvastatin 40 MG tablet Commonly known as:  ZOCOR Take 40 mg by mouth daily at 6 PM.   vitamin B-12 1000 MCG  tablet Commonly known as:  CYANOCOBALAMIN Take 1,000 mcg by mouth daily.         DISCHARGE INSTRUCTIONS:   DIET:  Cardiac diet  DISCHARGE CONDITION:  Stable  ACTIVITY:  Activity as tolerated  OXYGEN:  Home Oxygen: No.   Oxygen Delivery: room air  DISCHARGE LOCATION:  nursing home   If you experience worsening of your admission symptoms, develop shortness of breath, life threatening emergency, suicidal or homicidal thoughts you must seek medical attention immediately by calling 911 or calling your MD immediately  if symptoms less severe.  You Must read complete instructions/literature along with all the possible adverse reactions/side effects for all the Medicines you take and that have been prescribed to you. Take any new Medicines after you have completely understood and accpet all the possible adverse reactions/side effects.   Please note  You were cared for by a hospitalist during your hospital stay. If you have any questions about your discharge medications or the care you received while you were in the hospital after you are discharged, you can call the unit and asked to speak with the hospitalist on call if the hospitalist that took care of you is not available. Once you are discharged, your primary care physician will handle any further medical issues. Please note that NO REFILLS for any discharge medications will be authorized once you are discharged, as it is imperative that you return to your primary care physician (or establish a relationship with a primary care physician if you do not have one) for your aftercare needs so that they can reassess your need for medications and monitor your lab values.     Today   No acute complaints presently.  Mental status back to baseline.  NO acute events overnight.   VITAL SIGNS:  Blood pressure (!) 143/60, pulse 65, temperature 98 F (36.7 C), temperature source Oral, resp. rate 18, height 5\' 7"  (1.702 m), weight 57.7 kg  (127 lb 4.8 oz), SpO2 97 %.  I/O:   Intake/Output Summary (Last 24 hours) at 04/25/16 0844 Last data filed at 04/24/16 0900  Gross per 24 hour  Intake              240 ml  Output                0 ml  Net              240 ml    PHYSICAL EXAMINATION:   GENERAL:  82 y.o.-year-old patient lying in bed in no acute distress.  EYES: Pupils equal, round, reactive to light and accommodation. No scleral icterus. Extraocular muscles intact.  HEENT: Head atraumatic, normocephalic. Oropharynx and nasopharynx clear.  NECK:  Supple, no jugular venous distention. No thyroid enlargement, no tenderness.  LUNGS: Normal breath sounds bilaterally, no wheezing,  rales, rhonchi. No use of accessory muscles of respiration.  CARDIOVASCULAR: S1, S2 normal. II/VI SEM at LSB, rubs, or gallops.  ABDOMEN: Soft, nontender, nondistended. Bowel sounds present. No organomegaly or mass.  EXTREMITIES: No cyanosis, clubbing or edema b/l.    NEUROLOGIC: Cranial nerves II through XII are intact. No focal Motor or sensory deficits b/l.  Globally weak. PSYCHIATRIC: The patient is alert and oriented x 2.  SKIN: No obvious rash, lesion, or ulcer.   DATA REVIEW:   CBC  Recent Labs Lab 04/22/16 1602  WBC 12.9*  HGB 12.5  HCT 35.8  PLT 182    Chemistries   Recent Labs Lab 04/22/16 1602  04/24/16 0412  NA 124*  < > 132*  K 5.9*  < > 4.3  CL 90*  < > 102  CO2 24  < > 26  GLUCOSE 90  < > 98  BUN 24*  < > 20  CREATININE 1.02*  < > 0.84  CALCIUM 9.5  < > 8.1*  AST 50*  --   --   ALT 29  --   --   ALKPHOS 65  --   --   BILITOT 1.1  --   --   < > = values in this interval not displayed.  Cardiac Enzymes  Recent Labs Lab 04/22/16 1602  TROPONINI <0.03    Microbiology Results  Results for orders placed or performed during the hospital encounter of 04/22/16  Urine culture     Status: None   Collection Time: 04/22/16  4:02 PM  Result Value Ref Range Status   Specimen Description URINE, RANDOM  Final    Special Requests NONE  Final   Culture NO GROWTH Performed at [facility]   Final   Report Status 04/23/2016 FINAL  Final    RADIOLOGY:  No results found.    Management plans discussed with the patient, family and they are in agreement.  CODE STATUS:     Code Status Orders        Start     Ordered   04/22/16 1815  Full code  Continuous     04/22/16 1816    Code Status History    Date Active Date Inactive Code Status Order ID Comments User Context   02/20/2016  4:49 AM 02/21/2016  6:31 PM Full Code [id number]  Toy Baker, MD ED   08/10/2013  1:25 PM 08/12/2013  3:46 PM Full Code [id number]  Rogelia Mire, NP Inpatient    Advance Directive Documentation   Flowsheet Row Most Recent Value  Type of Advance Directive  Healthcare Power of Attorney, Living will  Pre-existing out of facility DNR order (yellow form or pink MOST form)  No data  "MOST" Form in Place?  No data      TOTAL TIME TAKING CARE OF THIS PATIENT: 40 minutes.    Henreitta Leber M.D on 04/25/2016 at 8:44 AM  Between 7am to 6pm - Pager - [phone]  After 6pm go to [email or web address] - Proofreader  [facility]  Hospitalists  Office  [phone]  CC: Primary care physician; Elsie Stain, MD

## 2016-04-25 NOTE — Progress Notes (Signed)
Clinical Social Worker was informed that patient will be medically ready to discharge to [facility]. Patient and her son- Jill Howell are in a agreement with plan. CSW called Doug- Admissions Coordinator at [facility] to confirm that patient's bed is ready. Provided patient's room number 407 and number to call for report [phone] . All discharge information faxed to [facility] via [place].    Call to patient's son- Jill Howell, to inform him patient would discharge to [facility]. He reported he'd transport her at [facility] will call report and patient will discharge to [facility] via her son Jill Howell [phone].  Ernest Pine, MSW, LCSW, [place] Clinical Social Worker [phone]

## 2016-04-25 NOTE — Clinical Social Work Placement (Signed)
   CLINICAL SOCIAL WORK PLACEMENT  NOTE  Date:  04/25/2016  Patient Details  Name: Jill Howell MRN: [id number] Date of Birth: November 06, 1918  Clinical Social Work is seeking post-discharge placement for this patient at the [place] level of care (*CSW will initial, date and re-position this form in  chart as items are completed):  Yes   Patient/family provided with [place] Work Department's list of facilities offering this level of care within the geographic area requested by the patient (or if unable, by the patient's family).  Yes   Patient/family informed of their freedom to choose among providers that offer the needed level of care, that participate in Medicare, Medicaid or managed care program needed by the patient, have an available bed and are willing to accept the patient.  Yes   Patient/family informed of [facility]'s ownership interest in [facility] and [facility], as well as of the fact that they are under no obligation to receive care at these facilities.  PASRR submitted to EDS on       PASRR number received on       Existing PASRR number confirmed on 04/23/16     FL2 transmitted to all facilities in geographic area requested by pt/family on 04/23/16     FL2 transmitted to all facilities within larger geographic area on       Patient informed that his/her managed care company has contracts with or will negotiate with certain facilities, including the following:        Yes   Patient/family informed of bed offers received.  Patient chooses bed at  [facility])     Physician recommends and patient chooses bed at      Patient to be transferred to  [facility]) on 04/25/16.  Patient to be transferred to facility by  [name]- Son)     Patient family notified on 04/25/16 of transfer.  Name of family member notified:   [name]- Son)     PHYSICIAN       Additional Comment:     _______________________________________________ Baldemar Lenis, LCSW 04/25/2016, 11:02 AM

## 2016-04-26 DIAGNOSIS — I509 Heart failure, unspecified: Secondary | ICD-10-CM | POA: Diagnosis not present

## 2016-04-26 DIAGNOSIS — J45901 Unspecified asthma with (acute) exacerbation: Secondary | ICD-10-CM | POA: Diagnosis not present

## 2016-04-26 DIAGNOSIS — I4891 Unspecified atrial fibrillation: Secondary | ICD-10-CM | POA: Diagnosis not present

## 2016-04-26 DIAGNOSIS — E871 Hypo-osmolality and hyponatremia: Secondary | ICD-10-CM | POA: Diagnosis not present

## 2016-04-26 DIAGNOSIS — G9340 Encephalopathy, unspecified: Secondary | ICD-10-CM | POA: Diagnosis not present

## 2016-04-28 DIAGNOSIS — R059 Cough, unspecified: Secondary | ICD-10-CM | POA: Insufficient documentation

## 2016-04-28 DIAGNOSIS — R05 Cough: Secondary | ICD-10-CM | POA: Insufficient documentation

## 2016-04-28 NOTE — Assessment & Plan Note (Signed)
Hyponatremia noted in hospital likely to aggravate background dementia. She'll be following up with her other physicians. We can get labs while she is here today, to help them. Plan- BMET, CBC

## 2016-04-28 NOTE — Assessment & Plan Note (Signed)
Chest x-ray did not show an acute process. I don't get a history of obvious reflux, postnasal drainage or wheezing. Plan-chest x-ray recheck

## 2016-05-02 ENCOUNTER — Telehealth: Payer: Self-pay

## 2016-05-02 NOTE — Telephone Encounter (Signed)
Was she placed at SNF?  I thought she was.  If so, then this needs to go through the attending doc there.  Let me know if/what I can do otherwise.   Thanks.

## 2016-05-02 NOTE — Telephone Encounter (Signed)
Jill Howell left v/m and request cb by Dr Damita Dunnings; pt is not thinking clearly to the point of delusional at times. Last seen 04/16/16.

## 2016-05-02 NOTE — Telephone Encounter (Signed)
Left detailed message on voicemail.  

## 2016-05-09 DIAGNOSIS — E785 Hyperlipidemia, unspecified: Secondary | ICD-10-CM | POA: Diagnosis not present

## 2016-05-09 DIAGNOSIS — I4891 Unspecified atrial fibrillation: Secondary | ICD-10-CM | POA: Diagnosis not present

## 2016-05-10 ENCOUNTER — Other Ambulatory Visit: Payer: Self-pay

## 2016-05-10 DIAGNOSIS — E119 Type 2 diabetes mellitus without complications: Secondary | ICD-10-CM | POA: Diagnosis not present

## 2016-05-10 DIAGNOSIS — F0390 Unspecified dementia without behavioral disturbance: Secondary | ICD-10-CM | POA: Diagnosis not present

## 2016-05-10 DIAGNOSIS — I4891 Unspecified atrial fibrillation: Secondary | ICD-10-CM | POA: Diagnosis not present

## 2016-05-10 DIAGNOSIS — I110 Hypertensive heart disease with heart failure: Secondary | ICD-10-CM | POA: Diagnosis not present

## 2016-05-10 DIAGNOSIS — I5032 Chronic diastolic (congestive) heart failure: Secondary | ICD-10-CM | POA: Diagnosis not present

## 2016-05-10 DIAGNOSIS — I2510 Atherosclerotic heart disease of native coronary artery without angina pectoris: Secondary | ICD-10-CM | POA: Diagnosis not present

## 2016-05-13 DIAGNOSIS — F0390 Unspecified dementia without behavioral disturbance: Secondary | ICD-10-CM | POA: Diagnosis not present

## 2016-05-13 DIAGNOSIS — E119 Type 2 diabetes mellitus without complications: Secondary | ICD-10-CM | POA: Diagnosis not present

## 2016-05-13 DIAGNOSIS — I110 Hypertensive heart disease with heart failure: Secondary | ICD-10-CM | POA: Diagnosis not present

## 2016-05-13 DIAGNOSIS — I5032 Chronic diastolic (congestive) heart failure: Secondary | ICD-10-CM | POA: Diagnosis not present

## 2016-05-13 DIAGNOSIS — I2510 Atherosclerotic heart disease of native coronary artery without angina pectoris: Secondary | ICD-10-CM | POA: Diagnosis not present

## 2016-05-13 DIAGNOSIS — I4891 Unspecified atrial fibrillation: Secondary | ICD-10-CM | POA: Diagnosis not present

## 2016-05-15 DIAGNOSIS — I5032 Chronic diastolic (congestive) heart failure: Secondary | ICD-10-CM | POA: Diagnosis not present

## 2016-05-15 DIAGNOSIS — I110 Hypertensive heart disease with heart failure: Secondary | ICD-10-CM | POA: Diagnosis not present

## 2016-05-15 DIAGNOSIS — F0390 Unspecified dementia without behavioral disturbance: Secondary | ICD-10-CM | POA: Diagnosis not present

## 2016-05-15 DIAGNOSIS — E119 Type 2 diabetes mellitus without complications: Secondary | ICD-10-CM | POA: Diagnosis not present

## 2016-05-15 DIAGNOSIS — I4891 Unspecified atrial fibrillation: Secondary | ICD-10-CM | POA: Diagnosis not present

## 2016-05-15 DIAGNOSIS — I2510 Atherosclerotic heart disease of native coronary artery without angina pectoris: Secondary | ICD-10-CM | POA: Diagnosis not present

## 2016-05-17 ENCOUNTER — Encounter: Payer: Self-pay | Admitting: Cardiovascular Disease

## 2016-05-17 ENCOUNTER — Ambulatory Visit (INDEPENDENT_AMBULATORY_CARE_PROVIDER_SITE_OTHER): Payer: Medicare Other | Admitting: Cardiovascular Disease

## 2016-05-17 VITALS — BP 140/62 | HR 69 | Ht 63.5 in | Wt 124.0 lb

## 2016-05-17 DIAGNOSIS — E785 Hyperlipidemia, unspecified: Secondary | ICD-10-CM | POA: Diagnosis not present

## 2016-05-17 DIAGNOSIS — E871 Hypo-osmolality and hyponatremia: Secondary | ICD-10-CM

## 2016-05-17 DIAGNOSIS — I2510 Atherosclerotic heart disease of native coronary artery without angina pectoris: Secondary | ICD-10-CM | POA: Diagnosis not present

## 2016-05-17 DIAGNOSIS — I5032 Chronic diastolic (congestive) heart failure: Secondary | ICD-10-CM

## 2016-05-17 DIAGNOSIS — I480 Paroxysmal atrial fibrillation: Secondary | ICD-10-CM | POA: Diagnosis not present

## 2016-05-17 NOTE — Progress Notes (Signed)
Cardiology Office Note  Date:  05/17/2016   ID:  Jill Howell, DOB [birth year], MRN [id number]  PCP:  Elsie Stain, MD   Chief Complaint  Patient presents with  . Other    4 month follow up. "doing well." Meds reviewed by the pt's bottles.     HPI:  Jill Howell Record is a 82 y.o. Female with a history of ischemic heart disease, coronary artery disease, coronary artery bypass graft surgery in 2000,   history of diastolic congestive heart failure and high cholesterol, GERD, hypothyroidism, carotid arterial disease with successful left carotid endarterectomy on 05/19/2013, who presents For routine follow-up of her coronary artery disease and recent hospital admission for hyponatremia and weakness Notes indicating she has diabetes Underlying pulmonary fibrosis, history of several pneumonias, followed by pulmonary  Recent lab work on 04/16/16 showed Low sodium 126 Several days later had progressive weakness, seen at [facility], sent to ER sodium 124, was given IV fluids Head CT scan showing atrophy no acute changes d/c 04/25/16 Then [facility] Last sodium 132 Lasix held at the time of discharge  Feels back to her baseline Denies any significant leg edema, shortness of breath, PND orthopnea No chest pain concerning for angina Legs still weak  Other lab work reviewed A1C 5.3 [facility] 142 in 2016  EKG on today's visit shows normal sinus rhythm with rate 70 bpm, nonspecific ST abnormality, old inferior MI  Other past medical history reviewed 08/10/13 presented to the office with history of crescendo angina.   left heart cardiac catheterization  showed that the saphenous vein graft to the obtuse marginal was occluded and the body of the graft with a large thrombus burden she was found to have a patent LIMA to the LAD, patent saphenous vein graft to the diagonal, and patent saphenous vein graft to the posterior descending. Her troponin peaked at 6.44. She was not a candidate for PCI and medical  therapy was recommended.   good LV function and showed moderate 2+ mitral regurgitation.   echocardiogram  08/11/13 which showed ejection fraction in the range of 50-55% with no regional wall motion abnormalities and she did have grade 2 diastolic dysfunction. Her pulmonary artery pressure  51.  history of falls, not on  Plavix   PMH:   has a past medical history of Allergic rhinitis, cause unspecified; Anemia; Asthma; Atrial fibrillation ([place]); CAD (coronary artery disease); Carotid arterial disease ([place]); Chronic diastolic CHF (congestive heart failure) ([place]); Esophageal reflux; History of TIA (transient ischemic attack); Hyperlipidemia; Hypertension; Hypothyroidism; Myocardial infarction [facility]); Type II or unspecified type diabetes mellitus without mention of complication, not stated as uncontrolled; and Unspecified hearing loss.  PSH:    Past Surgical History:  Procedure Laterality Date  . ABDOMINAL HYSTERECTOMY    . Bladder tack     x 2  . CARDIAC CATHETERIZATION  09/07/1999   EF 25%. THERE IS SEVERE MITRAL ANNULAR CALIFICATION, WITH  3 + INSUFFICIENCY  . CARDIOVASCULAR STRESS TEST  06/30/2007   EF 63%, NO ISCHEMIA  . CAROTID ENDARTERECTOMY Left 05-19-13   cea  . CHOLECYSTECTOMY  10/2001  . CORONARY ARTERY BYPASS GRAFT  2000   4 vessel  . DILATION AND CURETTAGE OF UTERUS    . ENDARTERECTOMY Left 05/19/2013   Procedure: ENDARTERECTOMY CAROTID-LEFT;  Surgeon: Elam Dutch, MD;  Location: [facility];  Service: Vascular;  Laterality: Left;  . EYE SURGERY Bilateral    Cataract  . HEMORRHOID SURGERY    . LEFT HEART CATH  Dec. 2, 2014   With  Coronary Graft  Angiogram  . LEFT HEART CATHETERIZATION WITH CORONARY/GRAFT ANGIOGRAM  08/10/2013   Procedure: LEFT HEART CATHETERIZATION WITH Beatrix Fetters;  Surgeon: Peter M Martinique, MD;  Location: [facility] CATH LAB;  Service: Cardiovascular;;  . PARTIAL HYSTERECTOMY    . RECTOCELE REPAIR     x 2  . ROTATOR CUFF REPAIR    . TONSILLECTOMY     . US ECHOCARDIOGRAPHY  01/17/2005   EF 55-60%    Current Outpatient Prescriptions  Medication Sig Dispense Refill  . aspirin 81 MG EC tablet Take 81 mg by mouth daily.      [name] atenolol (TENORMIN) 25 MG tablet Take 0.5 tablets (12.5 mg total) by mouth daily. 90 tablet 3  . BIOTIN PO Take 1 capsule by mouth daily.     . digoxin (DIGOX) 0.125 MG tablet Take 1 tablet (125 mcg total) by mouth daily. 90 tablet 3  . isosorbide mononitrate (IMDUR) 30 MG 24 hr tablet Take 30 mg by mouth daily.    [name] levothyroxine (SYNTHROID, LEVOTHROID) 100 MCG tablet Take 100 mcg by mouth daily before breakfast.    . losartan (COZAAR) 25 MG tablet Take 25 mg by mouth daily.    . Multiple Vitamins-Minerals (PRESERVISION AREDS PO) Take 1 tablet by mouth daily.    . nitroGLYCERIN (NITROSTAT) 0.4 MG SL tablet PLACE 1 TABLET UNDER THE TONGUE EVERY 5 MINUTES AS NEEDED FOR CHEST PAIN. 100 tablet PRN  . pantoprazole (PROTONIX) 40 MG tablet Take 1 tablet (40 mg total) by mouth daily. 90 tablet 3  . QUEtiapine (SEROQUEL) 25 MG tablet Take 25 mg by mouth at bedtime.     . simvastatin (ZOCOR) 40 MG tablet Take 40 mg by mouth daily at 6 PM.    . vitamin B-12 (CYANOCOBALAMIN) 1000 MCG tablet Take 1,000 mcg by mouth daily.      No current facility-administered medications for this visit.      Allergies:   Furosemide and Penicillins   Social History:  The patient  reports that she has never smoked. She has never used smokeless tobacco. She reports that she does not drink alcohol or use drugs.   Family History:   family history includes Alzheimer's disease in her mother; Cancer in her brother; Heart attack in her father; Heart disease in her father and son; Hypertension in her mother.    Review of Systems: Review of Systems  Constitutional: Negative.   Respiratory: Positive for shortness of breath.   Cardiovascular: Negative.   Gastrointestinal: Negative.   Musculoskeletal: Negative.   Neurological: Negative.    Psychiatric/Behavioral: Negative.   All other systems reviewed and are negative.    PHYSICAL EXAM: VS:  BP 140/62 (BP Location: Left Arm, Patient Position: Sitting, Cuff Size: Normal)   Pulse 69   Ht 5' 3.5" (1.613 m)   Wt 124 lb (56.2 kg)   BMI 21.62 kg/m  , BMI Body mass index is 21.62 kg/m. GEN: Well nourished, well developed, in no acute distress  HEENT: normal  Neck: no JVD, carotid bruits, or masses Cardiac: RRR; no murmurs, rubs, or gallops,no edema  Respiratory:  Rales at the bases, worse on the right than the left normal work of breathing GI: soft, nontender, nondistended, + BS MS: no deformity or atrophy  Skin: warm and dry, no rash Neuro:  Strength and sensation are intact Psych: euthymic mood, full affect    Recent Labs: 02/20/2016: Magnesium 2.0 04/22/2016: ALT 29; Hemoglobin 12.5; Platelets  182; TSH 0.893 04/24/2016: BUN 20; Creatinine, Ser 0.84; Potassium 4.3; Sodium 132    Lipid Panel Lab Results  Component Value Date   CHOL 142 06/09/2015   HDL 30.90 (L) 06/09/2015   LDLCALC 79 08/11/2013   TRIG 342.0 (H) 06/09/2015      Wt Readings from Last 3 Encounters:  05/17/16 124 lb (56.2 kg)  04/22/16 127 lb 4.8 oz (57.7 kg)  04/16/16 122 lb (55.3 kg)       ASSESSMENT AND PLAN:  Coronary artery disease involving native coronary artery of native heart without angina pectoris - Plan: EKG 12-Lead Currently with no symptoms of angina. No further workup at this time. Continue current medication regimen.  Atherosclerosis of native coronary artery of native heart without angina pectoris - Plan: EKG 12-Lead Denies any symptoms concerning for angina. No further testing at this time  Hyperlipidemia Cholesterol is at goal on the current lipid regimen. No changes to the medications were made. Continue simvastatin  Chronic diastolic CHF (congestive heart failure) (HCC) Currently not on Lasix given recent hyponatremia. Recommended she call our office for any  ankle swelling or worsening shortness of breath  PAF (paroxysmal atrial fibrillation) (HCC) Maintaining normal sinus rhythm. On aspirin, beta blocker   Hyponatremia Recent hospitalization for hyponatremia Resolved with IV fluids If low sodium persists, [place] need to hold her ARB Recommended she liberalize her salt intake   Total encounter time more than 25 minutes  Greater than 50% was spent in counseling and coordination of care with the patient    Disposition:   F/U  6 months   Orders Placed This Encounter  Procedures  . EKG 12-Lead     Signed, Esmond Plants, M.D., Ph.D. 05/17/2016  [place], [place]

## 2016-05-17 NOTE — Patient Instructions (Addendum)
Medication Instructions:   Add a small amount of salt/sodium to your diet  Labwork:  No new labs needed  Testing/Procedures:  No further testing at this time   Follow-Up: It was a pleasure seeing you in the office today. Please call us if you have new issues that need to be addressed before your next appt.  [phone]  Your physician wants you to follow-up in: 6 months.  You will receive a reminder letter in the mail two months in advance. If you don't receive a letter, please call our office to schedule the follow-up appointment.  If you need a refill on your cardiac medications before your next appointment, please call your pharmacy.

## 2016-05-20 DIAGNOSIS — I110 Hypertensive heart disease with heart failure: Secondary | ICD-10-CM | POA: Diagnosis not present

## 2016-05-20 DIAGNOSIS — E119 Type 2 diabetes mellitus without complications: Secondary | ICD-10-CM | POA: Diagnosis not present

## 2016-05-20 DIAGNOSIS — I5032 Chronic diastolic (congestive) heart failure: Secondary | ICD-10-CM | POA: Diagnosis not present

## 2016-05-20 DIAGNOSIS — F0390 Unspecified dementia without behavioral disturbance: Secondary | ICD-10-CM | POA: Diagnosis not present

## 2016-05-20 DIAGNOSIS — I4891 Unspecified atrial fibrillation: Secondary | ICD-10-CM | POA: Diagnosis not present

## 2016-05-20 DIAGNOSIS — I2510 Atherosclerotic heart disease of native coronary artery without angina pectoris: Secondary | ICD-10-CM | POA: Diagnosis not present

## 2016-05-22 DIAGNOSIS — I2510 Atherosclerotic heart disease of native coronary artery without angina pectoris: Secondary | ICD-10-CM | POA: Diagnosis not present

## 2016-05-22 DIAGNOSIS — F0390 Unspecified dementia without behavioral disturbance: Secondary | ICD-10-CM | POA: Diagnosis not present

## 2016-05-22 DIAGNOSIS — I4891 Unspecified atrial fibrillation: Secondary | ICD-10-CM | POA: Diagnosis not present

## 2016-05-22 DIAGNOSIS — I5032 Chronic diastolic (congestive) heart failure: Secondary | ICD-10-CM | POA: Diagnosis not present

## 2016-05-22 DIAGNOSIS — E119 Type 2 diabetes mellitus without complications: Secondary | ICD-10-CM | POA: Diagnosis not present

## 2016-05-22 DIAGNOSIS — I110 Hypertensive heart disease with heart failure: Secondary | ICD-10-CM | POA: Diagnosis not present

## 2016-05-23 DIAGNOSIS — I2510 Atherosclerotic heart disease of native coronary artery without angina pectoris: Secondary | ICD-10-CM | POA: Diagnosis not present

## 2016-05-23 DIAGNOSIS — E119 Type 2 diabetes mellitus without complications: Secondary | ICD-10-CM | POA: Diagnosis not present

## 2016-05-23 DIAGNOSIS — I4891 Unspecified atrial fibrillation: Secondary | ICD-10-CM | POA: Diagnosis not present

## 2016-05-23 DIAGNOSIS — I110 Hypertensive heart disease with heart failure: Secondary | ICD-10-CM | POA: Diagnosis not present

## 2016-05-23 DIAGNOSIS — F0390 Unspecified dementia without behavioral disturbance: Secondary | ICD-10-CM | POA: Diagnosis not present

## 2016-05-23 DIAGNOSIS — I5032 Chronic diastolic (congestive) heart failure: Secondary | ICD-10-CM | POA: Diagnosis not present

## 2016-05-24 DIAGNOSIS — E119 Type 2 diabetes mellitus without complications: Secondary | ICD-10-CM | POA: Diagnosis not present

## 2016-05-24 DIAGNOSIS — F0390 Unspecified dementia without behavioral disturbance: Secondary | ICD-10-CM | POA: Diagnosis not present

## 2016-05-24 DIAGNOSIS — I2510 Atherosclerotic heart disease of native coronary artery without angina pectoris: Secondary | ICD-10-CM | POA: Diagnosis not present

## 2016-05-24 DIAGNOSIS — I4891 Unspecified atrial fibrillation: Secondary | ICD-10-CM | POA: Diagnosis not present

## 2016-05-24 DIAGNOSIS — I110 Hypertensive heart disease with heart failure: Secondary | ICD-10-CM | POA: Diagnosis not present

## 2016-05-24 DIAGNOSIS — I5032 Chronic diastolic (congestive) heart failure: Secondary | ICD-10-CM | POA: Diagnosis not present

## 2016-05-26 DIAGNOSIS — I5032 Chronic diastolic (congestive) heart failure: Secondary | ICD-10-CM | POA: Diagnosis not present

## 2016-05-26 DIAGNOSIS — E119 Type 2 diabetes mellitus without complications: Secondary | ICD-10-CM | POA: Diagnosis not present

## 2016-05-26 DIAGNOSIS — F0390 Unspecified dementia without behavioral disturbance: Secondary | ICD-10-CM | POA: Diagnosis not present

## 2016-05-26 DIAGNOSIS — I110 Hypertensive heart disease with heart failure: Secondary | ICD-10-CM | POA: Diagnosis not present

## 2016-05-26 DIAGNOSIS — I2510 Atherosclerotic heart disease of native coronary artery without angina pectoris: Secondary | ICD-10-CM | POA: Diagnosis not present

## 2016-05-26 DIAGNOSIS — I4891 Unspecified atrial fibrillation: Secondary | ICD-10-CM | POA: Diagnosis not present

## 2016-05-28 DIAGNOSIS — E119 Type 2 diabetes mellitus without complications: Secondary | ICD-10-CM | POA: Diagnosis not present

## 2016-05-28 DIAGNOSIS — F0390 Unspecified dementia without behavioral disturbance: Secondary | ICD-10-CM | POA: Diagnosis not present

## 2016-05-28 DIAGNOSIS — I2510 Atherosclerotic heart disease of native coronary artery without angina pectoris: Secondary | ICD-10-CM | POA: Diagnosis not present

## 2016-05-28 DIAGNOSIS — I5032 Chronic diastolic (congestive) heart failure: Secondary | ICD-10-CM | POA: Diagnosis not present

## 2016-05-28 DIAGNOSIS — I4891 Unspecified atrial fibrillation: Secondary | ICD-10-CM | POA: Diagnosis not present

## 2016-05-28 DIAGNOSIS — I110 Hypertensive heart disease with heart failure: Secondary | ICD-10-CM | POA: Diagnosis not present

## 2016-05-30 DIAGNOSIS — I5032 Chronic diastolic (congestive) heart failure: Secondary | ICD-10-CM | POA: Diagnosis not present

## 2016-05-30 DIAGNOSIS — I2510 Atherosclerotic heart disease of native coronary artery without angina pectoris: Secondary | ICD-10-CM | POA: Diagnosis not present

## 2016-05-30 DIAGNOSIS — I110 Hypertensive heart disease with heart failure: Secondary | ICD-10-CM | POA: Diagnosis not present

## 2016-05-30 DIAGNOSIS — I4891 Unspecified atrial fibrillation: Secondary | ICD-10-CM | POA: Diagnosis not present

## 2016-05-30 DIAGNOSIS — E119 Type 2 diabetes mellitus without complications: Secondary | ICD-10-CM | POA: Diagnosis not present

## 2016-05-30 DIAGNOSIS — F0390 Unspecified dementia without behavioral disturbance: Secondary | ICD-10-CM | POA: Diagnosis not present

## 2016-06-03 DIAGNOSIS — I110 Hypertensive heart disease with heart failure: Secondary | ICD-10-CM | POA: Diagnosis not present

## 2016-06-03 DIAGNOSIS — F0390 Unspecified dementia without behavioral disturbance: Secondary | ICD-10-CM | POA: Diagnosis not present

## 2016-06-03 DIAGNOSIS — I5032 Chronic diastolic (congestive) heart failure: Secondary | ICD-10-CM | POA: Diagnosis not present

## 2016-06-03 DIAGNOSIS — E119 Type 2 diabetes mellitus without complications: Secondary | ICD-10-CM | POA: Diagnosis not present

## 2016-06-03 DIAGNOSIS — I2510 Atherosclerotic heart disease of native coronary artery without angina pectoris: Secondary | ICD-10-CM | POA: Diagnosis not present

## 2016-06-03 DIAGNOSIS — I4891 Unspecified atrial fibrillation: Secondary | ICD-10-CM | POA: Diagnosis not present

## 2016-06-04 DIAGNOSIS — I4891 Unspecified atrial fibrillation: Secondary | ICD-10-CM | POA: Diagnosis not present

## 2016-06-04 DIAGNOSIS — F0390 Unspecified dementia without behavioral disturbance: Secondary | ICD-10-CM | POA: Diagnosis not present

## 2016-06-04 DIAGNOSIS — E119 Type 2 diabetes mellitus without complications: Secondary | ICD-10-CM | POA: Diagnosis not present

## 2016-06-04 DIAGNOSIS — I110 Hypertensive heart disease with heart failure: Secondary | ICD-10-CM | POA: Diagnosis not present

## 2016-06-04 DIAGNOSIS — I2510 Atherosclerotic heart disease of native coronary artery without angina pectoris: Secondary | ICD-10-CM | POA: Diagnosis not present

## 2016-06-04 DIAGNOSIS — I5032 Chronic diastolic (congestive) heart failure: Secondary | ICD-10-CM | POA: Diagnosis not present

## 2016-06-10 DIAGNOSIS — D649 Anemia, unspecified: Secondary | ICD-10-CM | POA: Diagnosis not present

## 2016-06-10 DIAGNOSIS — I110 Hypertensive heart disease with heart failure: Secondary | ICD-10-CM | POA: Diagnosis not present

## 2016-06-10 DIAGNOSIS — I2510 Atherosclerotic heart disease of native coronary artery without angina pectoris: Secondary | ICD-10-CM | POA: Diagnosis not present

## 2016-06-10 DIAGNOSIS — F0390 Unspecified dementia without behavioral disturbance: Secondary | ICD-10-CM | POA: Diagnosis not present

## 2016-06-10 DIAGNOSIS — J45909 Unspecified asthma, uncomplicated: Secondary | ICD-10-CM | POA: Diagnosis not present

## 2016-06-10 DIAGNOSIS — I4891 Unspecified atrial fibrillation: Secondary | ICD-10-CM | POA: Diagnosis not present

## 2016-06-10 DIAGNOSIS — E119 Type 2 diabetes mellitus without complications: Secondary | ICD-10-CM | POA: Diagnosis not present

## 2016-06-10 DIAGNOSIS — I5032 Chronic diastolic (congestive) heart failure: Secondary | ICD-10-CM | POA: Diagnosis not present

## 2016-06-12 DIAGNOSIS — E119 Type 2 diabetes mellitus without complications: Secondary | ICD-10-CM | POA: Diagnosis not present

## 2016-06-12 DIAGNOSIS — I110 Hypertensive heart disease with heart failure: Secondary | ICD-10-CM | POA: Diagnosis not present

## 2016-06-12 DIAGNOSIS — I4891 Unspecified atrial fibrillation: Secondary | ICD-10-CM | POA: Diagnosis not present

## 2016-06-12 DIAGNOSIS — F0390 Unspecified dementia without behavioral disturbance: Secondary | ICD-10-CM | POA: Diagnosis not present

## 2016-06-12 DIAGNOSIS — I2510 Atherosclerotic heart disease of native coronary artery without angina pectoris: Secondary | ICD-10-CM | POA: Diagnosis not present

## 2016-06-12 DIAGNOSIS — I5032 Chronic diastolic (congestive) heart failure: Secondary | ICD-10-CM | POA: Diagnosis not present

## 2016-06-18 ENCOUNTER — Encounter: Payer: Self-pay | Admitting: Family

## 2016-06-19 ENCOUNTER — Ambulatory Visit (INDEPENDENT_AMBULATORY_CARE_PROVIDER_SITE_OTHER): Payer: Medicare Other

## 2016-06-19 DIAGNOSIS — Z23 Encounter for immunization: Secondary | ICD-10-CM

## 2016-06-20 ENCOUNTER — Ambulatory Visit (HOSPITAL_COMMUNITY)
Admission: RE | Admit: 2016-06-20 | Discharge: 2016-06-20 | Disposition: A | Discharge status: Home / Self Care | Payer: Medicare Other | Source: Ambulatory Visit | Attending: Family | Admitting: Family

## 2016-06-20 ENCOUNTER — Encounter: Payer: Self-pay | Admitting: Family

## 2016-06-20 ENCOUNTER — Ambulatory Visit (INDEPENDENT_AMBULATORY_CARE_PROVIDER_SITE_OTHER): Payer: Medicare Other | Admitting: Family

## 2016-06-20 VITALS — BP 146/61 | HR 65 | Temp 96.0°F | Resp 14 | Ht 63.5 in | Wt 123.0 lb

## 2016-06-20 DIAGNOSIS — I6523 Occlusion and stenosis of bilateral carotid arteries: Secondary | ICD-10-CM | POA: Insufficient documentation

## 2016-06-20 DIAGNOSIS — Z48812 Encounter for surgical aftercare following surgery on the circulatory system: Secondary | ICD-10-CM

## 2016-06-20 DIAGNOSIS — I200 Unstable angina: Secondary | ICD-10-CM

## 2016-06-20 DIAGNOSIS — I6522 Occlusion and stenosis of left carotid artery: Secondary | ICD-10-CM

## 2016-06-20 DIAGNOSIS — Z9889 Other specified postprocedural states: Secondary | ICD-10-CM

## 2016-06-20 LAB — VAS US CAROTID
LEFT ECA DIAS: -5 cm/s
LEFT VERTEBRAL DIAS: 11 cm/s
LICADDIAS: -11 cm/s
LICADSYS: -79 cm/s
Left CCA dist dias: 11 cm/s
Left CCA dist sys: 101 cm/s
Left CCA prox dias: 12 cm/s
Left CCA prox sys: 95 cm/s
RCCAPDIAS: 8 cm/s
RIGHT CCA MID DIAS: 13 cm/s
RIGHT VERTEBRAL DIAS: 10 cm/s
Right CCA prox sys: 77 cm/s
Right cca dist sys: -108 cm/s

## 2016-06-20 NOTE — Progress Notes (Signed)
Chief Complaint: Follow up Extracranial Carotid Artery Stenosis   History of Present Illness  Jill Howell is a 82 y.o. female patient of Dr. Oneida Alar who is s/p left CEA on 05/19/2013 for greater than 80% stenosis of the left ICA.  She returns today for follow up.   She had transient left arm numbness, expressive aphasia before the CEA, and a milder form of transient left arm numbness after the left CEA, these symptoms have resolved. She denies any further stroke or TIA activity since this.  She denies a history of monocular blindness.   She denies claudication symptoms with walking.   She had an MI December, 2014.  She was hospitalized at [facility] in August 2017 with hyponatremia.   Pt Diabetic: Yes, diet controlled  Pt smoker: non-smoker   Pt meds include:  Statin : Yes  ASA: Yes  Other anticoagulants/antiplatelets: no   Past Medical History:  Diagnosis Date  . Allergic rhinitis, cause unspecified   . Anemia   . Asthma   . Atrial fibrillation ([place])    a. remote->no anticoagulation 2/2 h/o falls.  [name] CAD (coronary artery disease)    a. 2000 s/p MI/CABG x 4 (VG-PDA, VG-OM, VG-Diag, LIMA-LAD);  b. 2011 MI;  c. 08/2013 NSTEMI/Cath: LM 90-95, LAD 90/100p, LCX 99p, 160m, RCA 100ost, VG-PDA nl, VG-OM 100 (culprit w/ thrombus), VG-Diag nl, LIMA-LAD nl, EF 55-60%, mild inf HK, mod MR-->Med Rx.  . Carotid arterial disease ([place])    a. 05/2013 s/p L CEA.  . Chronic diastolic CHF (congestive heart failure) ([place])    a. 08/2013 Echo: EF 50-55%, no rwma, Gr2 DD, mod MR, mildly dil LA, PASP 35mmHg.  [name] Esophageal reflux   . History of TIA (transient ischemic attack)   . Hyperlipidemia   . Hypertension   . Hypothyroidism   . Myocardial infarction    08-2013  . Type II or unspecified type diabetes mellitus without mention of complication, not stated as uncontrolled   . Unspecified hearing loss     Social History Social History  Substance Use Topics  . Smoking status: Never  Smoker  . Smokeless tobacco: Never Used  . Alcohol use No    Family History Family History  Problem Relation Age of Onset  . Heart attack Father     X's 3  . Heart disease Father   . Heart disease Son     before age 83  . Cancer Brother   . Hypertension Mother   . Alzheimer's disease Mother     Surgical History Past Surgical History:  Procedure Laterality Date  . ABDOMINAL HYSTERECTOMY    . Bladder tack     x 2  . CARDIAC CATHETERIZATION  09/07/1999   EF 25%. THERE IS SEVERE MITRAL ANNULAR CALIFICATION, WITH  3 + INSUFFICIENCY  . CARDIOVASCULAR STRESS TEST  06/30/2007   EF 63%, NO ISCHEMIA  . CAROTID ENDARTERECTOMY Left 05-19-13   cea  . CHOLECYSTECTOMY  10/2001  . CORONARY ARTERY BYPASS GRAFT  2000   4 vessel  . DILATION AND CURETTAGE OF UTERUS    . ENDARTERECTOMY Left 05/19/2013   Procedure: ENDARTERECTOMY CAROTID-LEFT;  Surgeon: Elam Dutch, MD;  Location: [facility];  Service: Vascular;  Laterality: Left;  . EYE SURGERY Bilateral    Cataract  . HEMORRHOID SURGERY    . LEFT HEART CATH  Dec. 2, 2014   With  Coronary Graft  Angiogram  . LEFT HEART CATHETERIZATION WITH CORONARY/GRAFT ANGIOGRAM  08/10/2013   Procedure:  LEFT HEART CATHETERIZATION WITH Beatrix Fetters;  Surgeon: Peter M Martinique, MD;  Location: [facility] CATH LAB;  Service: Cardiovascular;;  . PARTIAL HYSTERECTOMY    . RECTOCELE REPAIR     x 2  . ROTATOR CUFF REPAIR    . TONSILLECTOMY    . US ECHOCARDIOGRAPHY  01/17/2005   EF 55-60%    Allergies  Allergen Reactions  . Furosemide Other (See Comments)    Patient prefers not to take med, weakness and frequent urination  . Penicillins Other (See Comments)    unknown    Current Outpatient Prescriptions  Medication Sig Dispense Refill  . aspirin 81 MG EC tablet Take 81 mg by mouth daily.      [name] atenolol (TENORMIN) 25 MG tablet Take 0.5 tablets (12.5 mg total) by mouth daily. 90 tablet 3  . BIOTIN PO Take 1 capsule by mouth daily.     . digoxin  (DIGOX) 0.125 MG tablet Take 1 tablet (125 mcg total) by mouth daily. 90 tablet 3  . isosorbide mononitrate (IMDUR) 30 MG 24 hr tablet Take 30 mg by mouth daily.    [name] levothyroxine (SYNTHROID, LEVOTHROID) 100 MCG tablet Take 100 mcg by mouth daily before breakfast.    . losartan (COZAAR) 25 MG tablet Take 25 mg by mouth daily.    . Multiple Vitamins-Minerals (PRESERVISION AREDS PO) Take 1 tablet by mouth daily.    . nitroGLYCERIN (NITROSTAT) 0.4 MG SL tablet PLACE 1 TABLET UNDER THE TONGUE EVERY 5 MINUTES AS NEEDED FOR CHEST PAIN. 100 tablet PRN  . pantoprazole (PROTONIX) 40 MG tablet Take 1 tablet (40 mg total) by mouth daily. 90 tablet 3  . QUEtiapine (SEROQUEL) 25 MG tablet Take 25 mg by mouth at bedtime.     . simvastatin (ZOCOR) 40 MG tablet Take 40 mg by mouth daily at 6 PM.    . vitamin B-12 (CYANOCOBALAMIN) 1000 MCG tablet Take 1,000 mcg by mouth daily.      No current facility-administered medications for this visit.     Review of Systems : See HPI for pertinent positives and negatives.  Physical Examination  Vitals:   06/20/16 1033 06/20/16 1036 06/20/16 1037  BP: (!) 162/66 (!) 155/63 (!) 146/61  Pulse: 69 65 65  Resp: 14    Temp: (!) 96 F (35.6 C)    SpO2: 99%    Weight: 123 lb (55.8 kg)    Height: 5' 3.5" (1.613 m)     Body mass index is 21.45 kg/m.  General: WDWN female in NAD  GAIT: normal, steady Eyes: PERRLA  Pulmonary: Respirations are non-labored, CTAB, no rales, no rhonchi, & no wheezing.  Cardiac: regular rhythm and rate, no detected murmur.   VASCULAR EXAM  Carotid Bruits  Left  Right    Positive  positive   Aorta is not palpable.  Radial pulses are 2+ palpable and equal.   LE Pulses  LEFT  RIGHT   POPLITEAL  not palpable  not palpable   POSTERIOR TIBIAL  not palpable  not palpable   DORSALIS PEDIS  ANTERIOR TIBIAL  palpable  palpable    Gastrointestinal: soft, nontender, BS WNL, no r/g, no palpable masses.   Musculoskeletal: No muscle atrophy/wasting. M/S 5/5 throughout, Extremities without ischemic changes.  Neurologic: A&O X 3; Appropriate Affect, sensation is normal, Speech is normal  CN 2-12 intact except is hard of hearing, Pain and light touch intact in extremities, Motor exam as listed above.    Assessment: Jill Howell is  a 82 y.o. female who is s/p left CEA on 05/19/2013 for greater than 80% stenosis of the left ICA.  She had transient left arm numbness, expressive aphasia before the CEA, and a milder form of transient left arm numbness after the left CEA, these symptoms have resolved. She denies any further stroke or TIA activity since this.   Pt and son requested to transfer her surveillance to [facility] vascular surgery practice since they live in [place]; I spoke with Dr. Oneida Alar re this.     DATA Today's carotid duplex suggests minimal bilateral internal carotid artery stenoses.  >50% ECA stenoses bilaterally. Both vertebral arteries are antegrade. Both subclavian arteries are multiphasic. No significant change compared to exam of 06/15/2015.   Plan: Follow-up in 1 year with Carotid Duplex scan at [facility] vascular surgery practice.   I discussed in depth with the patient the nature of atherosclerosis, and emphasized the importance of maximal medical management including strict control of blood pressure, blood glucose, and lipid levels, obtaining regular exercise, and continued cessation of smoking.  The patient is aware that without maximal medical management the underlying atherosclerotic disease process will progress, limiting the benefit of any interventions. The patient was given information about stroke prevention and what symptoms should prompt the patient to seek immediate medical care. Thank you for allowing [place] to participate in this patient's care.  Clemon Chambers, RN, MSN, FNP-C [facility] Office: [phone]  Clinic Physician:  Oneida Alar  06/20/16 10:40 AM

## 2016-06-20 NOTE — Patient Instructions (Signed)
Stroke Prevention Some medical conditions and behaviors are associated with an increased chance of having a stroke. You Brauner prevent a stroke by making healthy choices and managing medical conditions. HOW CAN I REDUCE MY RISK OF HAVING A STROKE?   Stay physically active. Get at least 30 minutes of activity on most or all days.  Do not smoke. It Grater also be helpful to avoid exposure to secondhand smoke.  Limit alcohol use. Moderate alcohol use is considered to be:  No more than 2 drinks per day for men.  No more than 1 drink per day for nonpregnant women.  Eat healthy foods. This involves:  Eating 5 or more servings of fruits and vegetables a day.  Making dietary changes that address high blood pressure (hypertension), high cholesterol, diabetes, or obesity.  Manage your cholesterol levels.  Making food choices that are high in fiber and low in saturated fat, trans fat, and cholesterol Pagan control cholesterol levels.  Take any prescribed medicines to control cholesterol as directed by your health care provider.  Manage your diabetes.  Controlling your carbohydrate and sugar intake is recommended to manage diabetes.  Take any prescribed medicines to control diabetes as directed by your health care provider.  Control your hypertension.  Making food choices that are low in salt (sodium), saturated fat, trans fat, and cholesterol is recommended to manage hypertension.  Ask your health care provider if you need treatment to lower your blood pressure. Take any prescribed medicines to control hypertension as directed by your health care provider.  If you are 18-39 years of age, have your blood pressure checked every 3-5 years. If you are 40 years of age or older, have your blood pressure checked every year.  Maintain a healthy weight.  Reducing calorie intake and making food choices that are low in sodium, saturated fat, trans fat, and cholesterol are recommended to manage  weight.  Stop drug abuse.  Avoid taking birth control pills.  Talk to your health care provider about the risks of taking birth control pills if you are over 35 years old, smoke, get migraines, or have ever had a blood clot.  Get evaluated for sleep disorders (sleep apnea).  Talk to your health care provider about getting a sleep evaluation if you snore a lot or have excessive sleepiness.  Take medicines only as directed by your health care provider.  For some people, aspirin or blood thinners (anticoagulants) are helpful in reducing the risk of forming abnormal blood clots that can lead to stroke. If you have the irregular heart rhythm of atrial fibrillation, you should be on a blood thinner unless there is a good reason you cannot take them.  Understand all your medicine instructions.  Make sure that other conditions (such as anemia or atherosclerosis) are addressed. SEEK IMMEDIATE MEDICAL CARE IF:   You have sudden weakness or numbness of the face, arm, or leg, especially on one side of the body.  Your face or eyelid droops to one side.  You have sudden confusion.  You have trouble speaking (aphasia) or understanding.  You have sudden trouble seeing in one or both eyes.  You have sudden trouble walking.  You have dizziness.  You have a loss of balance or coordination.  You have a sudden, severe headache with no known cause.  You have new chest pain or an irregular heartbeat. Any of these symptoms Rua represent a serious problem that is an emergency. Do not wait to see if the symptoms will   go away. Get medical help at once. Call your local emergency services (911 in [place] Do not drive yourself to the hospital.   This information is not intended to replace advice given to you by your health care provider. Make sure you discuss any questions you have with your health care provider.   Document Released: 10/03/2004 Document Revised: 09/16/2014 Document Reviewed:  02/26/2013 Elsevier Interactive Patient Education 2016 Elsevier Inc.  

## 2016-06-20 NOTE — Progress Notes (Signed)
Vitals:   06/20/16 1033 06/20/16 1036  BP: (!) 162/66 (!) 155/63  Pulse: 69 65  Resp: 14   Temp: (!) 96 F (35.6 C)   SpO2: 99%   Weight: 123 lb (55.8 kg)   Height: 5' 3.5" (1.613 m)

## 2016-06-21 DIAGNOSIS — I4891 Unspecified atrial fibrillation: Secondary | ICD-10-CM | POA: Diagnosis not present

## 2016-06-21 DIAGNOSIS — I110 Hypertensive heart disease with heart failure: Secondary | ICD-10-CM | POA: Diagnosis not present

## 2016-06-21 DIAGNOSIS — E119 Type 2 diabetes mellitus without complications: Secondary | ICD-10-CM | POA: Diagnosis not present

## 2016-06-21 DIAGNOSIS — F0390 Unspecified dementia without behavioral disturbance: Secondary | ICD-10-CM | POA: Diagnosis not present

## 2016-06-21 DIAGNOSIS — I2510 Atherosclerotic heart disease of native coronary artery without angina pectoris: Secondary | ICD-10-CM | POA: Diagnosis not present

## 2016-06-21 DIAGNOSIS — I5032 Chronic diastolic (congestive) heart failure: Secondary | ICD-10-CM | POA: Diagnosis not present

## 2016-06-25 ENCOUNTER — Telehealth: Payer: Self-pay | Admitting: Internal Medicine

## 2016-06-25 MED ORDER — AZITHROMYCIN 250 MG PO TABS
ORAL_TABLET | ORAL | 0 refills | Status: DC
Start: 2016-06-25 — End: 2016-07-17

## 2016-06-25 MED ORDER — BENZONATATE 200 MG PO CAPS: 200.0000 mg | ORAL_CAPSULE | Freq: Three times a day (TID) | ORAL | 0 refills | Status: DC | PRN

## 2016-06-25 NOTE — Telephone Encounter (Signed)
Spoke with pt. States that she has a cold. Reports cough, sinus congestion. Cough is producing clear mucus. Denies chest tightness, wheezing, SOB or fever. Has been taking Mucinex Fast Max OTC with minimal relief. She is afraid this is going to turn into PNA. Pt does not have transportation for an office visit. CY - please advise. Thanks.  Allergies  Allergen Reactions  . Furosemide Other (See Comments)    Patient prefers not to take med, weakness and frequent urination  . Penicillins Other (See Comments)    unknown   Current Outpatient Prescriptions on File Prior to Visit  Medication Sig Dispense Refill  . aspirin 81 MG EC tablet Take 81 mg by mouth daily.      [name] atenolol (TENORMIN) 25 MG tablet Take 0.5 tablets (12.5 mg total) by mouth daily. 90 tablet 3  . BIOTIN PO Take 1 capsule by mouth daily.     . digoxin (DIGOX) 0.125 MG tablet Take 1 tablet (125 mcg total) by mouth daily. 90 tablet 3  . isosorbide mononitrate (IMDUR) 30 MG 24 hr tablet Take 30 mg by mouth daily.    [name] levothyroxine (SYNTHROID, LEVOTHROID) 100 MCG tablet Take 100 mcg by mouth daily before breakfast.    . losartan (COZAAR) 25 MG tablet Take 25 mg by mouth daily.    . Multiple Vitamins-Minerals (PRESERVISION AREDS PO) Take 1 tablet by mouth daily.    . nitroGLYCERIN (NITROSTAT) 0.4 MG SL tablet PLACE 1 TABLET UNDER THE TONGUE EVERY 5 MINUTES AS NEEDED FOR CHEST PAIN. 100 tablet PRN  . pantoprazole (PROTONIX) 40 MG tablet Take 1 tablet (40 mg total) by mouth daily. 90 tablet 3  . QUEtiapine (SEROQUEL) 25 MG tablet Take 25 mg by mouth at bedtime.     . simvastatin (ZOCOR) 40 MG tablet Take 40 mg by mouth daily at 6 PM.    . vitamin B-12 (CYANOCOBALAMIN) 1000 MCG tablet Take 1,000 mcg by mouth daily.      No current facility-administered medications on file prior to visit.

## 2016-06-25 NOTE — Telephone Encounter (Signed)
Called and spoke with pt and she is aware of meds that have been sent to her pharmacy and that we have asked that they deliver these to her. Nothing further is needed.

## 2016-06-25 NOTE — Telephone Encounter (Signed)
Attempted to contact pt. No answer, no option to leave a message. Will try back.  

## 2016-06-25 NOTE — Telephone Encounter (Signed)
Offer Zpak   # 6, 2 today then one daily           Tessalon perles 200 mg, # 30, 1 every 8 hours for cough if needed

## 2016-06-26 DIAGNOSIS — I110 Hypertensive heart disease with heart failure: Secondary | ICD-10-CM | POA: Diagnosis not present

## 2016-06-26 DIAGNOSIS — I2510 Atherosclerotic heart disease of native coronary artery without angina pectoris: Secondary | ICD-10-CM | POA: Diagnosis not present

## 2016-06-26 DIAGNOSIS — E119 Type 2 diabetes mellitus without complications: Secondary | ICD-10-CM | POA: Diagnosis not present

## 2016-06-26 DIAGNOSIS — I4891 Unspecified atrial fibrillation: Secondary | ICD-10-CM | POA: Diagnosis not present

## 2016-06-26 DIAGNOSIS — I5032 Chronic diastolic (congestive) heart failure: Secondary | ICD-10-CM | POA: Diagnosis not present

## 2016-06-26 DIAGNOSIS — F0390 Unspecified dementia without behavioral disturbance: Secondary | ICD-10-CM | POA: Diagnosis not present

## 2016-06-28 ENCOUNTER — Ambulatory Visit: Payer: Self-pay | Admitting: Internal Medicine

## 2016-07-05 DIAGNOSIS — I2510 Atherosclerotic heart disease of native coronary artery without angina pectoris: Secondary | ICD-10-CM | POA: Diagnosis not present

## 2016-07-05 DIAGNOSIS — I110 Hypertensive heart disease with heart failure: Secondary | ICD-10-CM | POA: Diagnosis not present

## 2016-07-05 DIAGNOSIS — I5032 Chronic diastolic (congestive) heart failure: Secondary | ICD-10-CM | POA: Diagnosis not present

## 2016-07-05 DIAGNOSIS — I4891 Unspecified atrial fibrillation: Secondary | ICD-10-CM | POA: Diagnosis not present

## 2016-07-05 DIAGNOSIS — F0390 Unspecified dementia without behavioral disturbance: Secondary | ICD-10-CM | POA: Diagnosis not present

## 2016-07-05 DIAGNOSIS — E119 Type 2 diabetes mellitus without complications: Secondary | ICD-10-CM | POA: Diagnosis not present

## 2016-07-15 ENCOUNTER — Telehealth: Payer: Self-pay | Admitting: Cardiovascular Disease

## 2016-07-15 ENCOUNTER — Other Ambulatory Visit: Payer: Self-pay | Admitting: Family Medicine

## 2016-07-15 NOTE — Telephone Encounter (Signed)
°*  STAT* If patient is at the pharmacy, call can be transferred to refill team.   1. Which medications need to be refilled? (please list name of each medication and dose if known)Seroquel 25 mg po qhs  2. Which pharmacy/location (including street and city if local pharmacy) is medication to be sent to? [place]   3. Do they need a 30 day or 90 day supply? [place]

## 2016-07-15 NOTE — Telephone Encounter (Signed)
Faxed refill request. Last Filled:   05/09/2016 historically.  Please advise.

## 2016-07-16 NOTE — Telephone Encounter (Signed)
Who did the initial rx?  It doesn't look like it came through [facility], based on the EMR.  Thanks.

## 2016-07-16 NOTE — Telephone Encounter (Signed)
I think it came through Cards.

## 2016-07-17 MED ORDER — QUETIAPINE FUMARATE 25 MG PO TABS: 25.0000 mg | ORAL_TABLET | Freq: Every day | ORAL | 1 refills | Status: DC

## 2016-07-17 NOTE — Telephone Encounter (Signed)
Patient notified as instructed by telephone and verbalized understand. Patient stated that she will have her son [name] call back and schedule the appointment .

## 2016-07-17 NOTE — Telephone Encounter (Signed)
Spoke to pharmacist and was advised that the script was originally written by Dr. Wynell Balloon at [facility], but patient is no longer there. Called and spoke to patient and was advised that she has been home now for 3-4 weeks and her son lives behind her and takes care of her medications.

## 2016-07-17 NOTE — Telephone Encounter (Signed)
Sent. Thanks.  Needs f/u here with family member present.  Bring in all meds of all variety to the OV.  72min OV.  Thanks.

## 2016-07-17 NOTE — Telephone Encounter (Signed)
I need pharmacy to route this to the rx'ing MD.  I don't think it came through [facility].  Thanks.

## 2016-07-24 DIAGNOSIS — H401132 Primary open-angle glaucoma, bilateral, moderate stage: Secondary | ICD-10-CM | POA: Diagnosis not present

## 2016-07-31 ENCOUNTER — Telehealth: Payer: Self-pay | Admitting: Family Medicine

## 2016-07-31 NOTE — Telephone Encounter (Signed)
Spoke to pt to get her scheduled for AWV + labs with Katha Cabal and OV 30 with Dr. Damita Dunnings. She asked me to call son [name] Cueva at his work to get her scheduled since he was her transportation to and from appts. LVM on [name] work phone to call back

## 2016-07-31 NOTE — Telephone Encounter (Signed)
Patient's son called.  He said patient won't come out in the winter weather twice.  He scheduled the awv and lab appointment with Dr.Duncan on 08/22/16.

## 2016-08-14 ENCOUNTER — Other Ambulatory Visit: Payer: Self-pay | Admitting: Family Medicine

## 2016-08-20 ENCOUNTER — Other Ambulatory Visit: Payer: Self-pay | Admitting: *Deleted

## 2016-08-20 MED ORDER — ISOSORBIDE MONONITRATE ER 30 MG PO TB24: 30.0000 mg | ORAL_TABLET | Freq: Every day | ORAL | 3 refills | Status: DC

## 2016-08-20 MED ORDER — LOSARTAN POTASSIUM 25 MG PO TABS: 25.0000 mg | ORAL_TABLET | Freq: Every day | ORAL | 3 refills | Status: DC

## 2016-08-20 NOTE — Telephone Encounter (Signed)
Requested Prescriptions   Signed Prescriptions Disp Refills  . isosorbide mononitrate (IMDUR) 30 MG 24 hr tablet 30 tablet 3    Sig: Take 1 tablet (30 mg total) by mouth daily.    Authorizing Provider: Minna Merritts    Ordering User: Britt Bottom  . losartan (COZAAR) 25 MG tablet 30 tablet 3    Sig: Take 1 tablet (25 mg total) by mouth daily.    Authorizing Provider: Minna Merritts    Ordering User: Britt Bottom

## 2016-08-21 ENCOUNTER — Encounter: Payer: Self-pay | Admitting: Family Medicine

## 2016-09-12 ENCOUNTER — Other Ambulatory Visit: Payer: Self-pay | Admitting: Family Medicine

## 2016-10-06 ENCOUNTER — Other Ambulatory Visit: Payer: Self-pay | Admitting: Family Medicine

## 2016-10-14 ENCOUNTER — Other Ambulatory Visit: Payer: Self-pay | Admitting: Family Medicine

## 2016-10-14 NOTE — Telephone Encounter (Signed)
Last office visit 04/26/2016.  Cancelled AWV scheduled for 08/21/2016.  Last refilled 09/12/2016 for #30 with 1 refill.  Refill?

## 2016-10-15 NOTE — Telephone Encounter (Signed)
This is 1 month early.  I didn't fill at this point.

## 2016-11-14 ENCOUNTER — Other Ambulatory Visit: Payer: Self-pay | Admitting: Family Medicine

## 2016-11-14 NOTE — Telephone Encounter (Signed)
Ok to refill? Last filled 09/12/16 #30 1 RF

## 2016-11-15 NOTE — Telephone Encounter (Signed)
Sent!

## 2016-11-26 ENCOUNTER — Other Ambulatory Visit: Payer: Self-pay | Admitting: Family Medicine

## 2016-11-26 NOTE — Telephone Encounter (Signed)
Received refill electronically Last office visit 04/16/16/acute Last lipid 06/09/15 Last appointment cancelled, no upcoming appointment scheduled

## 2016-11-27 NOTE — Telephone Encounter (Signed)
CPE this summer when convenient.  Med set.  Thanks.

## 2016-12-11 ENCOUNTER — Other Ambulatory Visit: Payer: Self-pay | Admitting: Cardiovascular Disease

## 2016-12-11 MED ORDER — LOSARTAN POTASSIUM 25 MG PO TABS: 25.0000 mg | ORAL_TABLET | Freq: Every day | ORAL | 0 refills | Status: DC

## 2016-12-11 MED ORDER — ISOSORBIDE MONONITRATE ER 30 MG PO TB24: 30.0000 mg | ORAL_TABLET | Freq: Every day | ORAL | 0 refills | Status: DC

## 2016-12-19 ENCOUNTER — Telehealth: Payer: Self-pay | Admitting: Family Medicine

## 2016-12-19 NOTE — Telephone Encounter (Signed)
Left pt message asking to call Allison back directly at [phone] to schedule AWV.+ labs with Lesia and CPE with PCP. °

## 2016-12-23 ENCOUNTER — Encounter: Payer: Self-pay | Admitting: Family Medicine

## 2016-12-23 ENCOUNTER — Ambulatory Visit (INDEPENDENT_AMBULATORY_CARE_PROVIDER_SITE_OTHER): Payer: Medicare Other | Admitting: Family Medicine

## 2016-12-23 VITALS — BP 164/64 | HR 78 | Temp 97.6°F | Ht 63.5 in | Wt 121.8 lb

## 2016-12-23 DIAGNOSIS — I5020 Unspecified systolic (congestive) heart failure: Secondary | ICD-10-CM | POA: Diagnosis not present

## 2016-12-23 DIAGNOSIS — R109 Unspecified abdominal pain: Secondary | ICD-10-CM | POA: Diagnosis not present

## 2016-12-23 DIAGNOSIS — G459 Transient cerebral ischemic attack, unspecified: Secondary | ICD-10-CM

## 2016-12-23 DIAGNOSIS — I2510 Atherosclerotic heart disease of native coronary artery without angina pectoris: Secondary | ICD-10-CM | POA: Diagnosis not present

## 2016-12-23 DIAGNOSIS — R079 Chest pain, unspecified: Secondary | ICD-10-CM

## 2016-12-23 DIAGNOSIS — R5383 Other fatigue: Secondary | ICD-10-CM

## 2016-12-23 DIAGNOSIS — Z8639 Personal history of other endocrine, nutritional and metabolic disease: Secondary | ICD-10-CM

## 2016-12-23 DIAGNOSIS — R5381 Other malaise: Secondary | ICD-10-CM | POA: Diagnosis not present

## 2016-12-23 LAB — BASIC METABOLIC PANEL
BUN: 21 mg/dL (ref 6–23)
CO2: 28 mEq/L (ref 19–32)
CREATININE: 0.96 mg/dL (ref 0.40–1.20)
Calcium: 10 mg/dL (ref 8.4–10.5)
Chloride: 96 mEq/L (ref 96–112)
GFR: 57.27 mL/min — ABNORMAL LOW (ref >60.00)
Glucose, Bld: 112 mg/dL — ABNORMAL HIGH (ref 70–99)
POTASSIUM: 4.5 meq/L (ref 3.5–5.1)
Sodium: 133 mEq/L — ABNORMAL LOW (ref 135–145)

## 2016-12-23 LAB — CBC WITH DIFFERENTIAL/PLATELET
BASOS ABS: 0.1 10*3/uL (ref 0.0–0.1)
Basophils Relative: 0.7 % (ref 0.0–3.0)
Eosinophils Absolute: 0.1 10*3/uL (ref 0.0–0.7)
Eosinophils Relative: 0.8 % (ref 0.0–5.0)
HEMATOCRIT: 37.6 % (ref 36.0–46.0)
Hemoglobin: 12.7 g/dL (ref 12.0–15.0)
Lymphocytes Relative: 23.5 % (ref 12.0–46.0)
Lymphs Abs: 2.9 10*3/uL (ref 0.7–4.0)
MCHC: 33.8 g/dL (ref 30.0–36.0)
MCV: 86.7 fl (ref 78.0–100.0)
Monocytes Absolute: 1.8 10*3/uL — ABNORMAL HIGH (ref 0.1–1.0)
Monocytes Relative: 14.3 % — ABNORMAL HIGH (ref 3.0–12.0)
NEUTROS ABS: 7.5 10*3/uL (ref 1.4–7.7)
Neutrophils Relative %: 60.7 % (ref 43.0–77.0)
PLATELETS: 216 10*3/uL (ref 150.0–400.0)
RBC: 4.34 Mil/uL (ref 3.87–5.11)
RDW: 14.3 % (ref 11.5–15.5)
WBC: 12.3 10*3/uL — AB (ref 4.0–10.5)

## 2016-12-23 LAB — TROPONIN I: TNIDX: 0.03 ug/l (ref 0.00–0.06)

## 2016-12-23 LAB — HEPATIC FUNCTION PANEL
ALBUMIN: 4.9 g/dL (ref 3.5–5.2)
ALK PHOS: 72 U/L (ref 39–117)
ALT: 16 U/L (ref 0–35)
AST: 21 U/L (ref 0–37)
Bilirubin, Direct: 0.1 mg/dL (ref 0.0–0.3)
TOTAL PROTEIN: 8.1 g/dL (ref 6.0–8.3)
Total Bilirubin: 0.6 mg/dL (ref 0.2–1.2)

## 2016-12-23 MED ORDER — GI COCKTAIL ~~LOC~~
30.0000 mL | Freq: Once | ORAL | Status: AC
  Administered 2016-12-23: 30 mL via ORAL

## 2016-12-23 NOTE — Progress Notes (Signed)
Pre visit review using our clinic review tool, if applicable. No additional management support is needed unless otherwise documented below in the visit note. 

## 2016-12-23 NOTE — Progress Notes (Signed)
Dr. Frederico Hamman T. Alisi Lupien, MD, [facility] [place], [address] Phone: [phone] Fax: [phone]  12/23/2016  Patient: Jill Howell, MRN: [id number], DOB: 12/21/1918, 83 y.o.  Primary Physician:  Elsie Stain, MD   Chief Complaint  Patient presents with  . Chest Pain    started last night-Under Left Rib Cage   Subjective:   Jill Howell is a 83 y.o. very pleasant female patient who presents with the following:  Chest pain / side pain. Start last night and under left rib cage.   h/o CAD, CHF, TIA, DM, Lipids, HTN, dementia. CABG x 4 in 2000.  Hurting in the ribs on the left side - no recent fall. She generally had some pain on the left side of her chest last night it was worse through the middle of the night.  Today, she is still having some pain on left side of her chest, but this is improved compared to yesterday.  Overall, she does not feel all that well today.  She does have some mild shortness of breath.  She feels weaker compared to her normal baseline.  She has recently been eating 3 meals a day, but a less yesterday and today.  Lives by herself in [place].  Additional history by son.  [phone] pt [phone] house [phone] cell  Past Medical History, Surgical History, Social History, Family History, Problem List, Medications, and Allergies have been reviewed and updated if relevant.  Patient Active Problem List   Diagnosis Date Noted  . Cough 04/28/2016  . Hyperkalemia 04/22/2016  . Pressure ulcer 02/20/2016  . CAD (coronary artery disease) 02/19/2016  . Dementia 02/19/2016  . Hyponatremia 02/19/2016  . Diastolic CHF, chronic ([place]) 02/19/2016  . Carotid stenosis 06/09/2014  . Asthmatic bronchitis , chronic ([place]) 03/24/2014  . Family discord 02/18/2014  . Aftercare following surgery of the circulatory system, [place] 12/07/2013  . Malaise and fatigue 12/01/2013  . Benign hypertensive heart  disease without heart failure 08/17/2013  . At risk for falls 08/12/2013  . Anemia   . TIA (transient ischemic attack) 05/17/2013  . Occlusion and stenosis of carotid artery without mention of cerebral infarction 05/13/2013  . Fall at home 05/11/2013  . PAF (paroxysmal atrial fibrillation) ([place]) 08/11/2012  . Carotid artery disease ([place]) 08/19/2011  . Dermatitis 05/09/2011  . HYPOTHYROIDISM 09/15/2007  . History of diabetes mellitus, type II 09/15/2007  . Hyperlipidemia 09/15/2007  . HEARING IMPAIRMENT 09/15/2007  . Coronary atherosclerosis 09/15/2007  . Congestive heart failure ([place]) 09/15/2007  . ALLERGIC RHINITIS 09/15/2007  . GERD 09/15/2007    Past Medical History:  Diagnosis Date  . Allergic rhinitis, cause unspecified   . Anemia   . Asthma   . Atrial fibrillation ([place])    a. remote->no anticoagulation 2/2 h/o falls.  [name] CAD (coronary artery disease)    a. 2000 s/p MI/CABG x 4 (VG-PDA, VG-OM, VG-Diag, LIMA-LAD);  b. 2011 MI;  c. 08/2013 NSTEMI/Cath: LM 90-95, LAD 90/100p, LCX 99p, 174m, RCA 100ost, VG-PDA nl, VG-OM 100 (culprit w/ thrombus), VG-Diag nl, LIMA-LAD nl, EF 55-60%, mild inf HK, mod MR-->Med Rx.  . Carotid arterial disease ([place])    a. 05/2013 s/p L CEA.  . Chronic diastolic CHF (congestive heart failure) ([place])    a. 08/2013 Echo: EF 50-55%, no rwma, Gr2 DD, mod MR, mildly dil LA, PASP 91mmHg.  [name] Esophageal reflux   . History of TIA (transient ischemic attack)   .  Hyperlipidemia   . Hypertension   . Hypothyroidism   . Myocardial infarction ([place])    08-2013  . Type II or unspecified type diabetes mellitus without mention of complication, not stated as uncontrolled   . Unspecified hearing loss     Past Surgical History:  Procedure Laterality Date  . ABDOMINAL HYSTERECTOMY    . Bladder tack     x 2  . CARDIAC CATHETERIZATION  09/07/1999   EF 25%. THERE IS SEVERE MITRAL ANNULAR CALIFICATION, WITH  3 + INSUFFICIENCY  . CARDIOVASCULAR STRESS TEST  06/30/2007     EF 63%, NO ISCHEMIA  . CAROTID ENDARTERECTOMY Left 05-19-13   cea  . CHOLECYSTECTOMY  10/2001  . CORONARY ARTERY BYPASS GRAFT  2000   4 vessel  . DILATION AND CURETTAGE OF UTERUS    . ENDARTERECTOMY Left 05/19/2013   Procedure: ENDARTERECTOMY CAROTID-LEFT;  Surgeon: Elam Dutch, MD;  Location: [facility];  Service: Vascular;  Laterality: Left;  . EYE SURGERY Bilateral    Cataract  . HEMORRHOID SURGERY    . LEFT HEART CATH  Dec. 2, 2014   With  Coronary Graft  Angiogram  . LEFT HEART CATHETERIZATION WITH CORONARY/GRAFT ANGIOGRAM  08/10/2013   Procedure: LEFT HEART CATHETERIZATION WITH Beatrix Fetters;  Surgeon: Peter M Martinique, MD;  Location: [facility] CATH LAB;  Service: Cardiovascular;;  . PARTIAL HYSTERECTOMY    . RECTOCELE REPAIR     x 2  . ROTATOR CUFF REPAIR    . TONSILLECTOMY    . US ECHOCARDIOGRAPHY  01/17/2005   EF 55-60%    Social History   Social History  . Marital status: Widowed    Spouse name: N/A  . Number of children: 4  . Years of education: N/A   Occupational History  . Retired    Social History Main Topics  . Smoking status: Never Smoker  . Smokeless tobacco: Never Used  . Alcohol use No  . Drug use: No  . Sexual activity: Not Currently   Other Topics Concern  . Not on file   Social History Narrative   Widowed      4 sons      Retired    Family History  Problem Relation Age of Onset  . Heart attack Father     X's 3  . Heart disease Father   . Heart disease Son     before age 4  . Cancer Brother   . Hypertension Mother   . Alzheimer's disease Mother     Allergies  Allergen Reactions  . Furosemide Other (See Comments)    Patient prefers not to take med, weakness and frequent urination  . Penicillins Other (See Comments)    unknown    Medication list reviewed and updated in full in [place].   GEN: No acute illnesses, no fevers, chills. GI: No n/v/d, eating normally but decreased Pulm: mild sob Left sided chest  pain Interactive and getting along well at home. Otherwise, the pertinent positives and negatives are listed above and in the HPI, otherwise a full review of systems has been reviewed and is negative unless noted positive.   Objective:   BP (!) 164/64   Pulse 78   Temp 97.6 F (36.4 C) (Oral)   Ht 5' 3.5" (1.613 m)   Wt 121 lb 12 oz (55.2 kg)   SpO2 95%   BMI 21.23 kg/m   GEN: WDWN, NAD, Non-toxic, A & O x 3 HEENT: Atraumatic, Normocephalic. Neck supple.  No masses, No LAD. Ears and Nose: No external deformity. CV: RRR, No M/G/R. No JVD. No thrill. No extra heart sounds. Chest wall is nontender. PULM: CTA B, no wheezes, crackles, rhonchi. No retractions. No resp. distress. No accessory muscle use. EXTR: No c/c/e NEURO Normal gait.  PSYCH: Normally interactive. Conversant. Not depressed or anxious appearing.  Calm demeanor.   Laboratory and Imaging Data: Results for orders placed or performed in visit on 16/10/96  Basic metabolic panel  Result Value Ref Range   Sodium 133 (L) 135 - 145 mEq/L   Potassium 4.5 3.5 - 5.1 mEq/L   Chloride 96 96 - 112 mEq/L   CO2 28 19 - 32 mEq/L   Glucose, Bld 112 (H) 70 - 99 mg/dL   BUN 21 6 - 23 mg/dL   Creatinine, Ser 0.96 0.40 - 1.20 mg/dL   Calcium 10.0 8.4 - 10.5 mg/dL   GFR 57.27 (L) >60.00 mL/min  CBC with Differential/Platelet  Result Value Ref Range   WBC 12.3 (H) 4.0 - 10.5 K/uL   RBC 4.34 3.87 - 5.11 Mil/uL   Hemoglobin 12.7 12.0 - 15.0 g/dL   HCT 37.6 36.0 - 46.0 %   MCV 86.7 78.0 - 100.0 fl   MCHC 33.8 30.0 - 36.0 g/dL   RDW 14.3 11.5 - 15.5 %   Platelets 216.0 150.0 - 400.0 K/uL   Neutrophils Relative % 60.7 43.0 - 77.0 %   Lymphocytes Relative 23.5 12.0 - 46.0 %   Monocytes Relative 14.3 (H) 3.0 - 12.0 %   Eosinophils Relative 0.8 0.0 - 5.0 %   Basophils Relative 0.7 0.0 - 3.0 %   Neutro Abs 7.5 1.4 - 7.7 K/uL   Lymphs Abs 2.9 0.7 - 4.0 K/uL   Monocytes Absolute 1.8 (H) 0.1 - 1.0 K/uL   Eosinophils Absolute 0.1 0.0 -  0.7 K/uL   Basophils Absolute 0.1 0.0 - 0.1 K/uL  Hepatic function panel  Result Value Ref Range   Total Bilirubin 0.6 0.2 - 1.2 mg/dL   Bilirubin, Direct 0.1 0.0 - 0.3 mg/dL   Alkaline Phosphatase 72 39 - 117 U/L   AST 21 0 - 37 U/L   ALT 16 0 - 35 U/L   Total Protein 8.1 6.0 - 8.3 g/dL   Albumin 4.9 3.5 - 5.2 g/dL  Troponin I  Result Value Ref Range   TNIDX 0.03 0.00 - 0.06 ug/l     Assessment and Plan:   Chest pain, unspecified type - Plan: EKG 04-VWUJ, Basic metabolic panel, CBC with Differential/Platelet, Hepatic function panel, Troponin I, Ambulatory referral to Cardiology  Flank pain - Plan: Basic metabolic panel, CBC with Differential/Platelet, Hepatic function panel, Troponin I, Ambulatory referral to Cardiology, gi cocktail (Maalox,Lidocaine,Donnatal)  Atherosclerosis of native coronary artery of native heart without angina pectoris  Systolic congestive heart failure, unspecified HF chronicity (HCC)  History of diabetes mellitus, type II  Transient cerebral ischemia, unspecified type  Malaise and fatigue  Coronary artery disease involving native coronary artery of native heart without angina pectoris  >40 minutes spent in face to face time with patient, >50% spent in counselling or coordination of care   Multiple risk factors.  Included above.  Known coronary disease.  History CABG.  At this point the patient's troponin has returned and is negative.  EKG: regular rate and rhythm. q waves on II, II, avF, v6. Inverted T on III. Not significantly changed from most recent EKG.  All lab tests have returned at this point  and are very reassuring.  I discussed this case with the patient's primary care provider, and also talk to her cardiologist on the telephone.  She has follow-up with cardiology scheduled for this Friday.  I have alerted the patient that her troponin is negative.  Meds ordered this encounter  Medications  . gi cocktail (Maalox,Lidocaine,Donnatal)    Medications Discontinued During This Encounter  Medication Reason  . benzonatate (TESSALON) 200 MG capsule Completed Course   Orders Placed This Encounter  Procedures  . Basic metabolic panel  . CBC with Differential/Platelet  . Hepatic function panel  . Troponin I  . Ambulatory referral to Cardiology  . EKG 12-Lead    Signed,  Mallissa Lorenzen T. Kaiden Dardis, MD   Allergies as of 12/23/2016      Reactions   Furosemide Other (See Comments)   Patient prefers not to take med, weakness and frequent urination   Penicillins Other (See Comments)   unknown      Medication List       Accurate as of 12/23/16  6:03 PM. Always use your most recent med list.          aspirin 81 MG EC tablet Take 81 mg by mouth daily.   atenolol 25 MG tablet Commonly known as:  TENORMIN Take 0.5 tablets (12.5 mg total) by mouth daily.   BIOTIN PO Take 1 capsule by mouth daily.   digoxin 0.125 MG tablet Commonly known as:  DIGOX Take 1 tablet (125 mcg total) by mouth daily.   isosorbide mononitrate 30 MG 24 hr tablet Commonly known as:  IMDUR Take 1 tablet (30 mg total) by mouth daily.   levothyroxine 100 MCG tablet Commonly known as:  SYNTHROID, LEVOTHROID TAKE ONE TABLET BY MOUTH EVERY DAY   losartan 25 MG tablet Commonly known as:  COZAAR Take 1 tablet (25 mg total) by mouth daily.   nitroGLYCERIN 0.4 MG SL tablet Commonly known as:  NITROSTAT PLACE 1 TABLET UNDER THE TONGUE EVERY 5 MINUTES AS NEEDED FOR CHEST PAIN.   pantoprazole 40 MG tablet Commonly known as:  PROTONIX Take 1 tablet (40 mg total) by mouth daily.   PRESERVISION AREDS PO Take 1 tablet by mouth daily.   QUEtiapine 25 MG tablet Commonly known as:  SEROQUEL TAKE ONE TABLET BY MOUTH EVERY EVENING AT BEDTIME   simvastatin 40 MG tablet Commonly known as:  ZOCOR TAKE 1 TABLET BY MOUTH DAILY FOR HYPERLIPIDEMIA   vitamin B-12 1000 MCG tablet Commonly known as:  CYANOCOBALAMIN Take 1,000 mcg by mouth daily.

## 2016-12-23 NOTE — Patient Instructions (Signed)

## 2016-12-26 DIAGNOSIS — Z951 Presence of aortocoronary bypass graft: Secondary | ICD-10-CM | POA: Insufficient documentation

## 2016-12-26 NOTE — Progress Notes (Signed)
Cardiology Office Note  Date:  12/27/2016   ID:  Jill Howell, DOB May 25, 1919, MRN [id number]  PCP:  Elsie Stain, MD   Chief Complaint  Patient presents with  . other    40month f/u c/o fatigue. Meds reviewed verbally with pt.    HPI:  Jill Howell is a 83 y.o. Female with a history of ischemic heart disease,  coronary artery disease,  coronary artery bypass graft surgery in 3086,    diastolic congestive heart failure  high cholesterol,  GERD, hypothyroidism,  carotid arterial disease with successful left carotid endarterectomy on 05/19/2013,  Ultrasound October 2017 done in [place] showing 40% or less stenosis bilaterally,  Prominent bruit on the right Hyponatremia, weakness Diabetes Pulmonary fibrosis, prior history of pneumonias Who presents for follow-up of her coronary artery disease, hyponatremia  lab work on 04/16/16 showed Low sodium 126 Son reports she was not eating, only drinking water Several days later had progressive weakness, seen at [facility], sent to ER sodium 124, was given IV fluids Head CT scan showing atrophy no acute changes d/c 04/25/16 Then [facility] Lasix held at the time of discharge  Lab work reviewed with her in detail Seen by primary care, reported having left-sided chest pain Recent lab  sodium 133 Normal troponin WBC of 12, hematocrit 37  Reports her ribs were in good details unclear, she is a poor historian No pain on palpation, no pain on movement Does not remember her appointment earlier in the week with primary care  EKG was unchanged Lives by herself in [place], son helps her  Denies any significant leg edema, shortness of breath, PND orthopnea Legs still weak No recent falls  EKG personally reviewed by myself on todays visit normal sinus rhythm with rate 62 bpm, nonspecific ST abnormality, old inferior MI  Other past medical history reviewed 08/10/13 presented to the office with history of crescendo angina.   left  heart cardiac catheterization  showed that the saphenous vein graft to the obtuse marginal was occluded and the body of the graft with a large thrombus burden she was found to have a patent LIMA to the LAD, patent saphenous vein graft to the diagonal, and patent saphenous vein graft to the posterior descending. Her troponin peaked at 6.44. She was not a candidate for PCI and medical therapy was recommended.   good LV function and showed moderate 2+ mitral regurgitation.   echocardiogram  08/11/13 which showed ejection fraction in the range of 50-55% with no regional wall motion abnormalities and she did have grade 2 diastolic dysfunction. Her pulmonary artery pressure  51.  history of falls, not on  Plavix   PMH:   has a past medical history of Allergic rhinitis, cause unspecified; Anemia; Asthma; Atrial fibrillation ([place]); CAD (coronary artery disease); Carotid arterial disease ([place]); Chronic diastolic CHF (congestive heart failure) ([place]); Esophageal reflux; History of TIA (transient ischemic attack); Hyperlipidemia; Hypertension; Hypothyroidism; Myocardial infarction [facility]); Type II or unspecified type diabetes mellitus without mention of complication, not stated as uncontrolled; and Unspecified hearing loss.  PSH:    Past Surgical History:  Procedure Laterality Date  . ABDOMINAL HYSTERECTOMY    . Bladder tack     x 2  . CARDIAC CATHETERIZATION  09/07/1999   EF 25%. THERE IS SEVERE MITRAL ANNULAR CALIFICATION, WITH  3 + INSUFFICIENCY  . CARDIOVASCULAR STRESS TEST  06/30/2007   EF 63%, NO ISCHEMIA  . CAROTID ENDARTERECTOMY Left 05-19-13   cea  . CHOLECYSTECTOMY  10/2001  .  CORONARY ARTERY BYPASS GRAFT  2000   4 vessel  . DILATION AND CURETTAGE OF UTERUS    . ENDARTERECTOMY Left 05/19/2013   Procedure: ENDARTERECTOMY CAROTID-LEFT;  Surgeon: Elam Dutch, MD;  Location: [facility];  Service: Vascular;  Laterality: Left;  . EYE SURGERY Bilateral    Cataract  . HEMORRHOID SURGERY    . LEFT  HEART CATH  Dec. 2, 2014   With  Coronary Graft  Angiogram  . LEFT HEART CATHETERIZATION WITH CORONARY/GRAFT ANGIOGRAM  08/10/2013   Procedure: LEFT HEART CATHETERIZATION WITH Beatrix Fetters;  Surgeon: Peter M Martinique, MD;  Location: [facility] CATH LAB;  Service: Cardiovascular;;  . PARTIAL HYSTERECTOMY    . RECTOCELE REPAIR     x 2  . ROTATOR CUFF REPAIR    . TONSILLECTOMY    . US ECHOCARDIOGRAPHY  01/17/2005   EF 55-60%    Current Outpatient Prescriptions  Medication Sig Dispense Refill  . aspirin 81 MG EC tablet Take 81 mg by mouth daily.      [name] atenolol (TENORMIN) 25 MG tablet Take 0.5 tablets (12.5 mg total) by mouth daily. 90 tablet 3  . BIOTIN PO Take 1 capsule by mouth daily.     . digoxin (DIGOX) 0.125 MG tablet Take 1 tablet (125 mcg total) by mouth daily. 90 tablet 3  . isosorbide mononitrate (IMDUR) 30 MG 24 hr tablet Take 1 tablet (30 mg total) by mouth daily. 30 tablet 0  . levothyroxine (SYNTHROID, LEVOTHROID) 100 MCG tablet TAKE ONE TABLET BY MOUTH EVERY DAY 90 tablet 1  . losartan (COZAAR) 25 MG tablet Take 1 tablet (25 mg total) by mouth daily. 30 tablet 0  . Multiple Vitamins-Minerals (PRESERVISION AREDS PO) Take 1 tablet by mouth daily.    . nitroGLYCERIN (NITROSTAT) 0.4 MG SL tablet PLACE 1 TABLET UNDER THE TONGUE EVERY 5 MINUTES AS NEEDED FOR CHEST PAIN. 100 tablet PRN  . pantoprazole (PROTONIX) 40 MG tablet Take 1 tablet (40 mg total) by mouth daily. 90 tablet 3  . QUEtiapine (SEROQUEL) 25 MG tablet TAKE ONE TABLET BY MOUTH EVERY EVENING AT BEDTIME 30 tablet 2  . simvastatin (ZOCOR) 40 MG tablet TAKE 1 TABLET BY MOUTH DAILY FOR HYPERLIPIDEMIA 30 tablet 5  . vitamin B-12 (CYANOCOBALAMIN) 1000 MCG tablet Take 1,000 mcg by mouth daily.      No current facility-administered medications for this visit.      Allergies:   Furosemide and Penicillins   Social History:  The patient  reports that she has never smoked. She has never used smokeless tobacco. She reports  that she does not drink alcohol or use drugs.   Family History:   family history includes Alzheimer's disease in her mother; Cancer in her brother; Heart attack in her father; Heart disease in her father and son; Hypertension in her mother.    Review of Systems: Review of Systems  Constitutional: Negative.   Respiratory: Negative.   Cardiovascular: Negative.   Gastrointestinal: Negative.   Musculoskeletal: Negative.   Neurological: Negative.   Psychiatric/Behavioral: Negative.   All other systems reviewed and are negative.    PHYSICAL EXAM: VS:  BP 130/60 (BP Location: Left Arm, Patient Position: Sitting, Cuff Size: Normal)   Pulse 62   Ht 5' 3.5" (1.613 m)   Wt 123 lb 12 oz (56.1 kg)   BMI 21.58 kg/m  , BMI Body mass index is 21.58 kg/m. GEN: Elderly, thin, frail,  in no acute distress  HEENT: normal  Neck: no JVD,  2+ carotid bruit on the right, no masses Cardiac: RRR; no murmurs, rubs, or gallops,no edema  Respiratory:  Rales at the bases, worse on the right than the left normal work of breathing GI: soft, nontender, nondistended, + BS MS: no deformity or atrophy  Skin: warm and dry, no rash Neuro:  Strength and sensation are intact Psych: euthymic mood, full affect    Recent Labs: 02/20/2016: Magnesium 2.0 04/22/2016: TSH 0.893 12/23/2016: ALT 16; BUN 21; Creatinine, Ser 0.96; Hemoglobin 12.7; Platelets 216.0; Potassium 4.5; Sodium 133    Lipid Panel Lab Results  Component Value Date   CHOL 142 06/09/2015   HDL 30.90 (L) 06/09/2015   LDLCALC 79 08/11/2013   TRIG 342.0 (H) 06/09/2015      Wt Readings from Last 3 Encounters:  12/27/16 123 lb 12 oz (56.1 kg)  12/23/16 121 lb 12 oz (55.2 kg)  06/20/16 123 lb (55.8 kg)       ASSESSMENT AND PLAN:   Coronary artery disease involving native coronary artery of native heart without angina pectoris - Plan: EKG 12-Lead Currently with no symptoms of angina. No further workup at this time. Continue current  medication regimen.  Carotid stenosis Significant bruit on the right concerning for hemodynamically significant disease Recent carotid ultrasound detailing 40% or less on the right October 2017, done in [place]  Atherosclerosis of native coronary artery of native heart without angina pectoris - Plan: EKG 12-Lead Denies any symptoms concerning for angina. No further testing at this time  Hyperlipidemia Cholesterol is at goal on the current lipid regimen. No changes to the medications were made. Continue simvastatin  Chronic diastolic CHF (congestive heart failure) ([place]) Appears relatively euvolemic on today's visit Currently not on Lasix  PAF (paroxysmal atrial fibrillation) ([place]) Maintaining normal sinus rhythm. On aspirin, beta blocker, digoxin  Flank pain Atypical in nature, does not complain on today's visit Poor historian with dementia No further ischemic workup needed   Hyponatremia Sodium level is stable, eating and drinking appropriately Low-sodium in the past when she had anorexia    Total encounter time more than 25 minutes  Greater than 50% was spent in counseling and coordination of care with the patient    Disposition:   F/U  6 months   Orders Placed This Encounter  Procedures  . EKG 12-Lead     Signed, Esmond Plants, M.D., Ph.D. 12/27/2016  [place], [place]

## 2016-12-27 ENCOUNTER — Ambulatory Visit (INDEPENDENT_AMBULATORY_CARE_PROVIDER_SITE_OTHER): Payer: Medicare Other | Admitting: Cardiovascular Disease

## 2016-12-27 ENCOUNTER — Other Ambulatory Visit: Payer: Self-pay | Admitting: Family Medicine

## 2016-12-27 ENCOUNTER — Encounter: Payer: Self-pay | Admitting: Cardiovascular Disease

## 2016-12-27 VITALS — BP 130/60 | HR 62 | Ht 63.5 in | Wt 123.8 lb

## 2016-12-27 DIAGNOSIS — I6522 Occlusion and stenosis of left carotid artery: Secondary | ICD-10-CM

## 2016-12-27 DIAGNOSIS — F0390 Unspecified dementia without behavioral disturbance: Secondary | ICD-10-CM

## 2016-12-27 DIAGNOSIS — E871 Hypo-osmolality and hyponatremia: Secondary | ICD-10-CM

## 2016-12-27 DIAGNOSIS — I209 Angina pectoris, unspecified: Secondary | ICD-10-CM | POA: Diagnosis not present

## 2016-12-27 DIAGNOSIS — I25118 Atherosclerotic heart disease of native coronary artery with other forms of angina pectoris: Secondary | ICD-10-CM | POA: Diagnosis not present

## 2016-12-27 DIAGNOSIS — Z951 Presence of aortocoronary bypass graft: Secondary | ICD-10-CM | POA: Diagnosis not present

## 2016-12-27 NOTE — Patient Instructions (Signed)

## 2017-01-06 ENCOUNTER — Encounter: Payer: Medicare Other | Admitting: Family Medicine

## 2017-01-08 ENCOUNTER — Ambulatory Visit (INDEPENDENT_AMBULATORY_CARE_PROVIDER_SITE_OTHER): Payer: Medicare Other | Admitting: Family Medicine

## 2017-01-08 ENCOUNTER — Encounter: Payer: Self-pay | Admitting: Family Medicine

## 2017-01-08 VITALS — BP 140/70 | HR 70 | Temp 97.7°F | Ht 63.5 in | Wt 122.5 lb

## 2017-01-08 DIAGNOSIS — E039 Hypothyroidism, unspecified: Secondary | ICD-10-CM

## 2017-01-08 DIAGNOSIS — I480 Paroxysmal atrial fibrillation: Secondary | ICD-10-CM | POA: Diagnosis not present

## 2017-01-08 DIAGNOSIS — F0390 Unspecified dementia without behavioral disturbance: Secondary | ICD-10-CM

## 2017-01-08 DIAGNOSIS — I119 Hypertensive heart disease without heart failure: Secondary | ICD-10-CM

## 2017-01-08 DIAGNOSIS — Z9181 History of falling: Secondary | ICD-10-CM

## 2017-01-08 DIAGNOSIS — Z7189 Other specified counseling: Secondary | ICD-10-CM

## 2017-01-08 DIAGNOSIS — I6522 Occlusion and stenosis of left carotid artery: Secondary | ICD-10-CM | POA: Diagnosis not present

## 2017-01-08 MED ORDER — LEVOTHYROXINE SODIUM 100 MCG PO TABS: 100.0000 ug | ORAL_TABLET | Freq: Every day | ORAL | 3 refills | Status: DC

## 2017-01-08 MED ORDER — DIGOXIN 125 MCG PO TABS: 125.0000 ug | ORAL_TABLET | Freq: Every day | ORAL | 3 refills | Status: DC

## 2017-01-08 MED ORDER — QUETIAPINE FUMARATE 25 MG PO TABS: ORAL_TABLET | ORAL | 3 refills | Status: DC

## 2017-01-08 NOTE — Assessment & Plan Note (Signed)
She has family nearby and she wears a fall button at home.

## 2017-01-08 NOTE — Assessment & Plan Note (Signed)
She is not paranoid. She is functional at home. She has given up on driving. Family helps her with medication. No red flag events. Apparently okay to continue as is. She prefers to be at home and there is currently no contraindication to this.

## 2017-01-08 NOTE — Progress Notes (Signed)
Pre visit review using our clinic review tool, if applicable. No additional management support is needed unless otherwise documented below in the visit note. 

## 2017-01-08 NOTE — Patient Instructions (Signed)
Don't change your meds for now.   Take care.  Glad to see you. Update me as needed.   

## 2017-01-08 NOTE — Progress Notes (Signed)
Not an AMW visit, she is up-to-date on most preventive issues and a significant number of preventive issues would not apply given her age. It would be reasonable to defer DXA for now, d/w pt, she and family agreed.   Hypertension:    Using medication without problems or lightheadedness: yes Chest pain with exertion:no Edema:no Short of breath:no Other issues: no NTG use.   h/o AF.  Off blood thinners due to fall risk.   No recent falls.   Discussed with patient and family about rationale for avoiding blood thinners.  Hypothyroidism. TSH previously normal. Compliant. No adverse effect on medication.  History of dementia. She had previously been getting paranoid. She clearly did better with Seroquel use. Rationale for medication discussed with patient and family. She is not as anxious and paranoid now. She still lives independently and safely. She has family living nearby. See below regarding memory testing.  She wears a fall button at home.  Advance directive discussed with patient. Should she be incapacitated she would want her son [name] to make decisions for her.  Meds, vitals, and allergies reviewed.   PMH and SH reviewed  ROS: Per HPI unless specifically indicated in ROS section   GEN: nad, alert and pleasant but not fully oriented She knows the month but neither the year nor the day of the week. She can read a watch and do basic math. 3 out of 3 for attention but 0 out of 3 on recall. She knows her address. HEENT: mucous membranes moist NECK: supple w/o LA CV: rrr with occ ectopy noted, not tachy PULM: ctab, no inc wob ABD: soft, +bs EXT: no edema SKIN: no acute rash

## 2017-01-08 NOTE — Assessment & Plan Note (Signed)
TSH previously normal. Continue as is. No thyromegaly on exam.

## 2017-01-08 NOTE — Assessment & Plan Note (Signed)
Advance directive discussed with patient. Should she be incapacitated she would want her son [name] to make decisions for her.

## 2017-01-08 NOTE — Assessment & Plan Note (Signed)
Would avoid anticoagulation.

## 2017-01-08 NOTE — Assessment & Plan Note (Signed)
Blood pressure control. Not lightheaded. Continue as is. Previous labs discussed with patient. She has no more chest pain in the interval. Okay for outpatient follow-up. >25 minutes spent in face to face time with patient, >50% spent in counselling or coordination of care.

## 2017-01-10 NOTE — Telephone Encounter (Signed)
Seen 5 2 18 

## 2017-02-04 ENCOUNTER — Other Ambulatory Visit: Payer: Self-pay | Admitting: Cardiovascular Disease

## 2017-04-04 ENCOUNTER — Encounter: Payer: Self-pay | Admitting: Family Medicine

## 2017-04-04 ENCOUNTER — Ambulatory Visit (INDEPENDENT_AMBULATORY_CARE_PROVIDER_SITE_OTHER): Payer: Medicare Other | Admitting: Family Medicine

## 2017-04-04 VITALS — BP 148/62 | HR 83 | Temp 98.0°F | Wt 124.8 lb

## 2017-04-04 DIAGNOSIS — R5383 Other fatigue: Secondary | ICD-10-CM

## 2017-04-04 DIAGNOSIS — Y92009 Unspecified place in unspecified non-institutional (private) residence as the place of occurrence of the external cause: Secondary | ICD-10-CM

## 2017-04-04 DIAGNOSIS — W19XXXA Unspecified fall, initial encounter: Secondary | ICD-10-CM | POA: Diagnosis not present

## 2017-04-04 DIAGNOSIS — I6522 Occlusion and stenosis of left carotid artery: Secondary | ICD-10-CM

## 2017-04-04 LAB — CBC WITH DIFFERENTIAL/PLATELET
BASOS PCT: 1 %
Basophils Absolute: 81 cells/uL (ref 0–200)
Eosinophils Absolute: 162 cells/uL (ref 15–500)
Eosinophils Relative: 2 %
HEMATOCRIT: 32.2 % — AB (ref 35.0–45.0)
Hemoglobin: 11 g/dL — ABNORMAL LOW (ref 11.7–15.5)
LYMPHS PCT: 32 %
Lymphs Abs: 2592 cells/uL (ref 850–3900)
MCH: 30.4 pg (ref 27.0–33.0)
MCHC: 34.2 g/dL (ref 32.0–36.0)
MCV: 89 fL (ref 80.0–100.0)
MONO ABS: 1215 {cells}/uL — AB (ref 200–950)
MONOS PCT: 15 %
MPV: 10.6 fL (ref 7.5–12.5)
Neutro Abs: 4050 cells/uL (ref 1500–7800)
Neutrophils Relative %: 50 %
PLATELETS: 197 10*3/uL (ref 140–400)
RBC: 3.62 MIL/uL — AB (ref 3.80–5.10)
RDW: 14.3 % (ref 11.0–15.0)
WBC: 8.1 10*3/uL (ref 3.8–10.8)

## 2017-04-04 LAB — TSH: TSH: 6.86 mIU/L — ABNORMAL HIGH

## 2017-04-04 NOTE — Progress Notes (Signed)
Meals on wheels at home.  Living by herself.  Not driving.  Has family checking on her.  She isn't walking as much.  H/o falls in the last month. Has a fall button in case, d/w pt.  Not lightheaded.  Mood is okay, "I reckon I'm happy."  No exertional CP.  Not SOB.  Still eating well.  Family thinks she is not getting up is much, she is less active, she is getting deconditioned.  Meds, vitals, and allergies reviewed.   ROS: Per HPI unless specifically indicated in ROS section   GEN: nad, alert and pleasant but repeats herself in conversation HEENT: mucous membranes moist NECK: supple w/o LA CV: rrr with occasional ectopy noted. PULM: ctab, no inc wob ABD: soft, +bs EXT: no edema SKIN: no acute rash Gait is symmetric but slow and slightly unsteady. She corrects her gait as she is moving. No focal weakness noted.

## 2017-04-04 NOTE — Patient Instructions (Signed)
Go to the lab on the way out.  We'll contact you with your lab report. Jill Howell will call about your referral. Take care.  Glad to see you.  If you get lightheaded at all then let me know so we can adjust your BP meds.

## 2017-04-05 LAB — BASIC METABOLIC PANEL
BUN: 27 mg/dL — AB (ref 7–25)
CALCIUM: 8.9 mg/dL (ref 8.6–10.4)
CHLORIDE: 97 mmol/L — AB (ref 98–110)
CO2: 19 mmol/L — ABNORMAL LOW (ref 20–31)
CREATININE: 1.14 mg/dL — AB (ref 0.60–0.88)
Glucose, Bld: 110 mg/dL — ABNORMAL HIGH (ref 65–99)
Potassium: 4.8 mmol/L (ref 3.5–5.3)
Sodium: 131 mmol/L — ABNORMAL LOW (ref 135–146)

## 2017-04-06 NOTE — Assessment & Plan Note (Signed)
See below. Refer to home health PT.

## 2017-04-06 NOTE — Assessment & Plan Note (Addendum)
Reasonable to check basic labs with CBC, thyroid, basic metabolic panel. I expect this to be unremarkable, and I expect some of her symptoms to be related to deconditioning. Discussed with patient and family. Will arrange for home health PT as she has a fall at home in the last month. She has an alarm button to use. Family is check on her. Patient wants to continue living at home. I think the with extra help, including home health PT, she should be able to stay at home. >25 minutes spent in face to face time with patient, >50% spent in counselling or coordination of care .

## 2017-04-08 ENCOUNTER — Other Ambulatory Visit: Payer: Self-pay | Admitting: Family Medicine

## 2017-04-09 ENCOUNTER — Other Ambulatory Visit: Payer: Self-pay | Admitting: Family Medicine

## 2017-04-09 ENCOUNTER — Telehealth: Payer: Self-pay | Admitting: Family Medicine

## 2017-04-09 NOTE — Telephone Encounter (Signed)
Thanks

## 2017-04-09 NOTE — Telephone Encounter (Signed)
Jill Howell with [facility] called to let you know PT will get her initial eval for Home Health services tomorrow, 04/10/17.

## 2017-04-10 DIAGNOSIS — I5032 Chronic diastolic (congestive) heart failure: Secondary | ICD-10-CM | POA: Diagnosis not present

## 2017-04-10 DIAGNOSIS — I110 Hypertensive heart disease with heart failure: Secondary | ICD-10-CM | POA: Diagnosis not present

## 2017-04-10 DIAGNOSIS — J449 Chronic obstructive pulmonary disease, unspecified: Secondary | ICD-10-CM | POA: Diagnosis not present

## 2017-04-10 DIAGNOSIS — I2510 Atherosclerotic heart disease of native coronary artery without angina pectoris: Secondary | ICD-10-CM | POA: Diagnosis not present

## 2017-04-10 DIAGNOSIS — E119 Type 2 diabetes mellitus without complications: Secondary | ICD-10-CM | POA: Diagnosis not present

## 2017-04-10 DIAGNOSIS — I4891 Unspecified atrial fibrillation: Secondary | ICD-10-CM | POA: Diagnosis not present

## 2017-04-14 DIAGNOSIS — I4891 Unspecified atrial fibrillation: Secondary | ICD-10-CM | POA: Diagnosis not present

## 2017-04-14 DIAGNOSIS — J449 Chronic obstructive pulmonary disease, unspecified: Secondary | ICD-10-CM | POA: Diagnosis not present

## 2017-04-14 DIAGNOSIS — I110 Hypertensive heart disease with heart failure: Secondary | ICD-10-CM | POA: Diagnosis not present

## 2017-04-14 DIAGNOSIS — I5032 Chronic diastolic (congestive) heart failure: Secondary | ICD-10-CM | POA: Diagnosis not present

## 2017-04-14 DIAGNOSIS — E119 Type 2 diabetes mellitus without complications: Secondary | ICD-10-CM | POA: Diagnosis not present

## 2017-04-14 DIAGNOSIS — I2510 Atherosclerotic heart disease of native coronary artery without angina pectoris: Secondary | ICD-10-CM | POA: Diagnosis not present

## 2017-04-17 DIAGNOSIS — E119 Type 2 diabetes mellitus without complications: Secondary | ICD-10-CM | POA: Diagnosis not present

## 2017-04-17 DIAGNOSIS — I2510 Atherosclerotic heart disease of native coronary artery without angina pectoris: Secondary | ICD-10-CM | POA: Diagnosis not present

## 2017-04-17 DIAGNOSIS — I5032 Chronic diastolic (congestive) heart failure: Secondary | ICD-10-CM | POA: Diagnosis not present

## 2017-04-17 DIAGNOSIS — I110 Hypertensive heart disease with heart failure: Secondary | ICD-10-CM | POA: Diagnosis not present

## 2017-04-17 DIAGNOSIS — I4891 Unspecified atrial fibrillation: Secondary | ICD-10-CM | POA: Diagnosis not present

## 2017-04-17 DIAGNOSIS — J449 Chronic obstructive pulmonary disease, unspecified: Secondary | ICD-10-CM | POA: Diagnosis not present

## 2017-04-23 DIAGNOSIS — I2510 Atherosclerotic heart disease of native coronary artery without angina pectoris: Secondary | ICD-10-CM | POA: Diagnosis not present

## 2017-04-23 DIAGNOSIS — I4891 Unspecified atrial fibrillation: Secondary | ICD-10-CM | POA: Diagnosis not present

## 2017-04-23 DIAGNOSIS — I110 Hypertensive heart disease with heart failure: Secondary | ICD-10-CM | POA: Diagnosis not present

## 2017-04-23 DIAGNOSIS — I5032 Chronic diastolic (congestive) heart failure: Secondary | ICD-10-CM | POA: Diagnosis not present

## 2017-04-23 DIAGNOSIS — J449 Chronic obstructive pulmonary disease, unspecified: Secondary | ICD-10-CM | POA: Diagnosis not present

## 2017-04-23 DIAGNOSIS — E119 Type 2 diabetes mellitus without complications: Secondary | ICD-10-CM | POA: Diagnosis not present

## 2017-04-24 DIAGNOSIS — E119 Type 2 diabetes mellitus without complications: Secondary | ICD-10-CM | POA: Diagnosis not present

## 2017-04-24 DIAGNOSIS — I5032 Chronic diastolic (congestive) heart failure: Secondary | ICD-10-CM | POA: Diagnosis not present

## 2017-04-24 DIAGNOSIS — J449 Chronic obstructive pulmonary disease, unspecified: Secondary | ICD-10-CM | POA: Diagnosis not present

## 2017-04-24 DIAGNOSIS — I2510 Atherosclerotic heart disease of native coronary artery without angina pectoris: Secondary | ICD-10-CM | POA: Diagnosis not present

## 2017-04-24 DIAGNOSIS — I4891 Unspecified atrial fibrillation: Secondary | ICD-10-CM | POA: Diagnosis not present

## 2017-04-24 DIAGNOSIS — I110 Hypertensive heart disease with heart failure: Secondary | ICD-10-CM | POA: Diagnosis not present

## 2017-04-29 DIAGNOSIS — I4891 Unspecified atrial fibrillation: Secondary | ICD-10-CM | POA: Diagnosis not present

## 2017-04-29 DIAGNOSIS — I110 Hypertensive heart disease with heart failure: Secondary | ICD-10-CM | POA: Diagnosis not present

## 2017-04-29 DIAGNOSIS — E119 Type 2 diabetes mellitus without complications: Secondary | ICD-10-CM | POA: Diagnosis not present

## 2017-04-29 DIAGNOSIS — I5032 Chronic diastolic (congestive) heart failure: Secondary | ICD-10-CM | POA: Diagnosis not present

## 2017-04-29 DIAGNOSIS — J449 Chronic obstructive pulmonary disease, unspecified: Secondary | ICD-10-CM | POA: Diagnosis not present

## 2017-04-29 DIAGNOSIS — I2510 Atherosclerotic heart disease of native coronary artery without angina pectoris: Secondary | ICD-10-CM | POA: Diagnosis not present

## 2017-05-01 DIAGNOSIS — I2510 Atherosclerotic heart disease of native coronary artery without angina pectoris: Secondary | ICD-10-CM | POA: Diagnosis not present

## 2017-05-01 DIAGNOSIS — E119 Type 2 diabetes mellitus without complications: Secondary | ICD-10-CM | POA: Diagnosis not present

## 2017-05-01 DIAGNOSIS — I110 Hypertensive heart disease with heart failure: Secondary | ICD-10-CM | POA: Diagnosis not present

## 2017-05-01 DIAGNOSIS — J449 Chronic obstructive pulmonary disease, unspecified: Secondary | ICD-10-CM | POA: Diagnosis not present

## 2017-05-01 DIAGNOSIS — I5032 Chronic diastolic (congestive) heart failure: Secondary | ICD-10-CM | POA: Diagnosis not present

## 2017-05-01 DIAGNOSIS — I4891 Unspecified atrial fibrillation: Secondary | ICD-10-CM | POA: Diagnosis not present

## 2017-05-06 DIAGNOSIS — I2510 Atherosclerotic heart disease of native coronary artery without angina pectoris: Secondary | ICD-10-CM | POA: Diagnosis not present

## 2017-05-06 DIAGNOSIS — I4891 Unspecified atrial fibrillation: Secondary | ICD-10-CM | POA: Diagnosis not present

## 2017-05-06 DIAGNOSIS — E119 Type 2 diabetes mellitus without complications: Secondary | ICD-10-CM | POA: Diagnosis not present

## 2017-05-06 DIAGNOSIS — I5032 Chronic diastolic (congestive) heart failure: Secondary | ICD-10-CM | POA: Diagnosis not present

## 2017-05-06 DIAGNOSIS — I110 Hypertensive heart disease with heart failure: Secondary | ICD-10-CM | POA: Diagnosis not present

## 2017-05-06 DIAGNOSIS — J449 Chronic obstructive pulmonary disease, unspecified: Secondary | ICD-10-CM | POA: Diagnosis not present

## 2017-05-08 ENCOUNTER — Telehealth: Payer: Self-pay

## 2017-05-08 DIAGNOSIS — I4891 Unspecified atrial fibrillation: Secondary | ICD-10-CM | POA: Diagnosis not present

## 2017-05-08 DIAGNOSIS — E119 Type 2 diabetes mellitus without complications: Secondary | ICD-10-CM | POA: Diagnosis not present

## 2017-05-08 DIAGNOSIS — I2510 Atherosclerotic heart disease of native coronary artery without angina pectoris: Secondary | ICD-10-CM | POA: Diagnosis not present

## 2017-05-08 DIAGNOSIS — J449 Chronic obstructive pulmonary disease, unspecified: Secondary | ICD-10-CM | POA: Diagnosis not present

## 2017-05-08 DIAGNOSIS — I5032 Chronic diastolic (congestive) heart failure: Secondary | ICD-10-CM | POA: Diagnosis not present

## 2017-05-08 DIAGNOSIS — I110 Hypertensive heart disease with heart failure: Secondary | ICD-10-CM | POA: Diagnosis not present

## 2017-05-08 NOTE — Telephone Encounter (Signed)
Please give the order.  Thanks.   

## 2017-05-08 NOTE — Telephone Encounter (Signed)
Waldon Reining PT with [facility] left v/m requesting verbal orders HH PT 2 x a week for 4 weeks.

## 2017-05-08 NOTE — Telephone Encounter (Signed)
Waldon Reining PT with [facility] advised.

## 2017-05-14 DIAGNOSIS — J449 Chronic obstructive pulmonary disease, unspecified: Secondary | ICD-10-CM | POA: Diagnosis not present

## 2017-05-14 DIAGNOSIS — I110 Hypertensive heart disease with heart failure: Secondary | ICD-10-CM | POA: Diagnosis not present

## 2017-05-14 DIAGNOSIS — E119 Type 2 diabetes mellitus without complications: Secondary | ICD-10-CM | POA: Diagnosis not present

## 2017-05-14 DIAGNOSIS — I2510 Atherosclerotic heart disease of native coronary artery without angina pectoris: Secondary | ICD-10-CM | POA: Diagnosis not present

## 2017-05-14 DIAGNOSIS — I4891 Unspecified atrial fibrillation: Secondary | ICD-10-CM | POA: Diagnosis not present

## 2017-05-14 DIAGNOSIS — I5032 Chronic diastolic (congestive) heart failure: Secondary | ICD-10-CM | POA: Diagnosis not present

## 2017-05-16 DIAGNOSIS — J449 Chronic obstructive pulmonary disease, unspecified: Secondary | ICD-10-CM | POA: Diagnosis not present

## 2017-05-16 DIAGNOSIS — I4891 Unspecified atrial fibrillation: Secondary | ICD-10-CM | POA: Diagnosis not present

## 2017-05-16 DIAGNOSIS — I5032 Chronic diastolic (congestive) heart failure: Secondary | ICD-10-CM | POA: Diagnosis not present

## 2017-05-16 DIAGNOSIS — I2510 Atherosclerotic heart disease of native coronary artery without angina pectoris: Secondary | ICD-10-CM | POA: Diagnosis not present

## 2017-05-16 DIAGNOSIS — I110 Hypertensive heart disease with heart failure: Secondary | ICD-10-CM | POA: Diagnosis not present

## 2017-05-16 DIAGNOSIS — E119 Type 2 diabetes mellitus without complications: Secondary | ICD-10-CM | POA: Diagnosis not present

## 2017-05-19 DIAGNOSIS — I110 Hypertensive heart disease with heart failure: Secondary | ICD-10-CM | POA: Diagnosis not present

## 2017-05-19 DIAGNOSIS — J449 Chronic obstructive pulmonary disease, unspecified: Secondary | ICD-10-CM | POA: Diagnosis not present

## 2017-05-19 DIAGNOSIS — I4891 Unspecified atrial fibrillation: Secondary | ICD-10-CM | POA: Diagnosis not present

## 2017-05-19 DIAGNOSIS — I5032 Chronic diastolic (congestive) heart failure: Secondary | ICD-10-CM | POA: Diagnosis not present

## 2017-05-19 DIAGNOSIS — E119 Type 2 diabetes mellitus without complications: Secondary | ICD-10-CM | POA: Diagnosis not present

## 2017-05-19 DIAGNOSIS — I2510 Atherosclerotic heart disease of native coronary artery without angina pectoris: Secondary | ICD-10-CM | POA: Diagnosis not present

## 2017-05-21 DIAGNOSIS — J449 Chronic obstructive pulmonary disease, unspecified: Secondary | ICD-10-CM | POA: Diagnosis not present

## 2017-05-21 DIAGNOSIS — I2510 Atherosclerotic heart disease of native coronary artery without angina pectoris: Secondary | ICD-10-CM | POA: Diagnosis not present

## 2017-05-21 DIAGNOSIS — I4891 Unspecified atrial fibrillation: Secondary | ICD-10-CM | POA: Diagnosis not present

## 2017-05-21 DIAGNOSIS — I110 Hypertensive heart disease with heart failure: Secondary | ICD-10-CM | POA: Diagnosis not present

## 2017-05-21 DIAGNOSIS — I5032 Chronic diastolic (congestive) heart failure: Secondary | ICD-10-CM | POA: Diagnosis not present

## 2017-05-21 DIAGNOSIS — E119 Type 2 diabetes mellitus without complications: Secondary | ICD-10-CM | POA: Diagnosis not present

## 2017-05-27 DIAGNOSIS — I5032 Chronic diastolic (congestive) heart failure: Secondary | ICD-10-CM | POA: Diagnosis not present

## 2017-05-27 DIAGNOSIS — I110 Hypertensive heart disease with heart failure: Secondary | ICD-10-CM | POA: Diagnosis not present

## 2017-05-27 DIAGNOSIS — E119 Type 2 diabetes mellitus without complications: Secondary | ICD-10-CM | POA: Diagnosis not present

## 2017-05-27 DIAGNOSIS — I2510 Atherosclerotic heart disease of native coronary artery without angina pectoris: Secondary | ICD-10-CM | POA: Diagnosis not present

## 2017-05-27 DIAGNOSIS — I4891 Unspecified atrial fibrillation: Secondary | ICD-10-CM | POA: Diagnosis not present

## 2017-05-27 DIAGNOSIS — J449 Chronic obstructive pulmonary disease, unspecified: Secondary | ICD-10-CM | POA: Diagnosis not present

## 2017-05-29 DIAGNOSIS — I4891 Unspecified atrial fibrillation: Secondary | ICD-10-CM | POA: Diagnosis not present

## 2017-05-29 DIAGNOSIS — I110 Hypertensive heart disease with heart failure: Secondary | ICD-10-CM | POA: Diagnosis not present

## 2017-05-29 DIAGNOSIS — J449 Chronic obstructive pulmonary disease, unspecified: Secondary | ICD-10-CM | POA: Diagnosis not present

## 2017-05-29 DIAGNOSIS — E119 Type 2 diabetes mellitus without complications: Secondary | ICD-10-CM | POA: Diagnosis not present

## 2017-05-29 DIAGNOSIS — I2510 Atherosclerotic heart disease of native coronary artery without angina pectoris: Secondary | ICD-10-CM | POA: Diagnosis not present

## 2017-05-29 DIAGNOSIS — I5032 Chronic diastolic (congestive) heart failure: Secondary | ICD-10-CM | POA: Diagnosis not present

## 2017-05-30 ENCOUNTER — Other Ambulatory Visit: Payer: Self-pay | Admitting: Family Medicine

## 2017-06-03 DIAGNOSIS — J449 Chronic obstructive pulmonary disease, unspecified: Secondary | ICD-10-CM | POA: Diagnosis not present

## 2017-06-03 DIAGNOSIS — I2510 Atherosclerotic heart disease of native coronary artery without angina pectoris: Secondary | ICD-10-CM | POA: Diagnosis not present

## 2017-06-03 DIAGNOSIS — I110 Hypertensive heart disease with heart failure: Secondary | ICD-10-CM | POA: Diagnosis not present

## 2017-06-03 DIAGNOSIS — E119 Type 2 diabetes mellitus without complications: Secondary | ICD-10-CM | POA: Diagnosis not present

## 2017-06-03 DIAGNOSIS — I4891 Unspecified atrial fibrillation: Secondary | ICD-10-CM | POA: Diagnosis not present

## 2017-06-03 DIAGNOSIS — I5032 Chronic diastolic (congestive) heart failure: Secondary | ICD-10-CM | POA: Diagnosis not present

## 2017-06-05 DIAGNOSIS — J449 Chronic obstructive pulmonary disease, unspecified: Secondary | ICD-10-CM | POA: Diagnosis not present

## 2017-06-05 DIAGNOSIS — I110 Hypertensive heart disease with heart failure: Secondary | ICD-10-CM | POA: Diagnosis not present

## 2017-06-05 DIAGNOSIS — I5032 Chronic diastolic (congestive) heart failure: Secondary | ICD-10-CM | POA: Diagnosis not present

## 2017-06-05 DIAGNOSIS — I4891 Unspecified atrial fibrillation: Secondary | ICD-10-CM | POA: Diagnosis not present

## 2017-06-05 DIAGNOSIS — I2510 Atherosclerotic heart disease of native coronary artery without angina pectoris: Secondary | ICD-10-CM | POA: Diagnosis not present

## 2017-06-05 DIAGNOSIS — E119 Type 2 diabetes mellitus without complications: Secondary | ICD-10-CM | POA: Diagnosis not present

## 2017-06-08 IMAGING — DX DG CHEST 1V PORT
1 series · 1 of 1 positions shown · non-contrast
Comparison: Chest radiograph 04/04/2016

CLINICAL DATA: Altered mental status.

EXAM:
PORTABLE CHEST 1 VIEW

[chest ap]
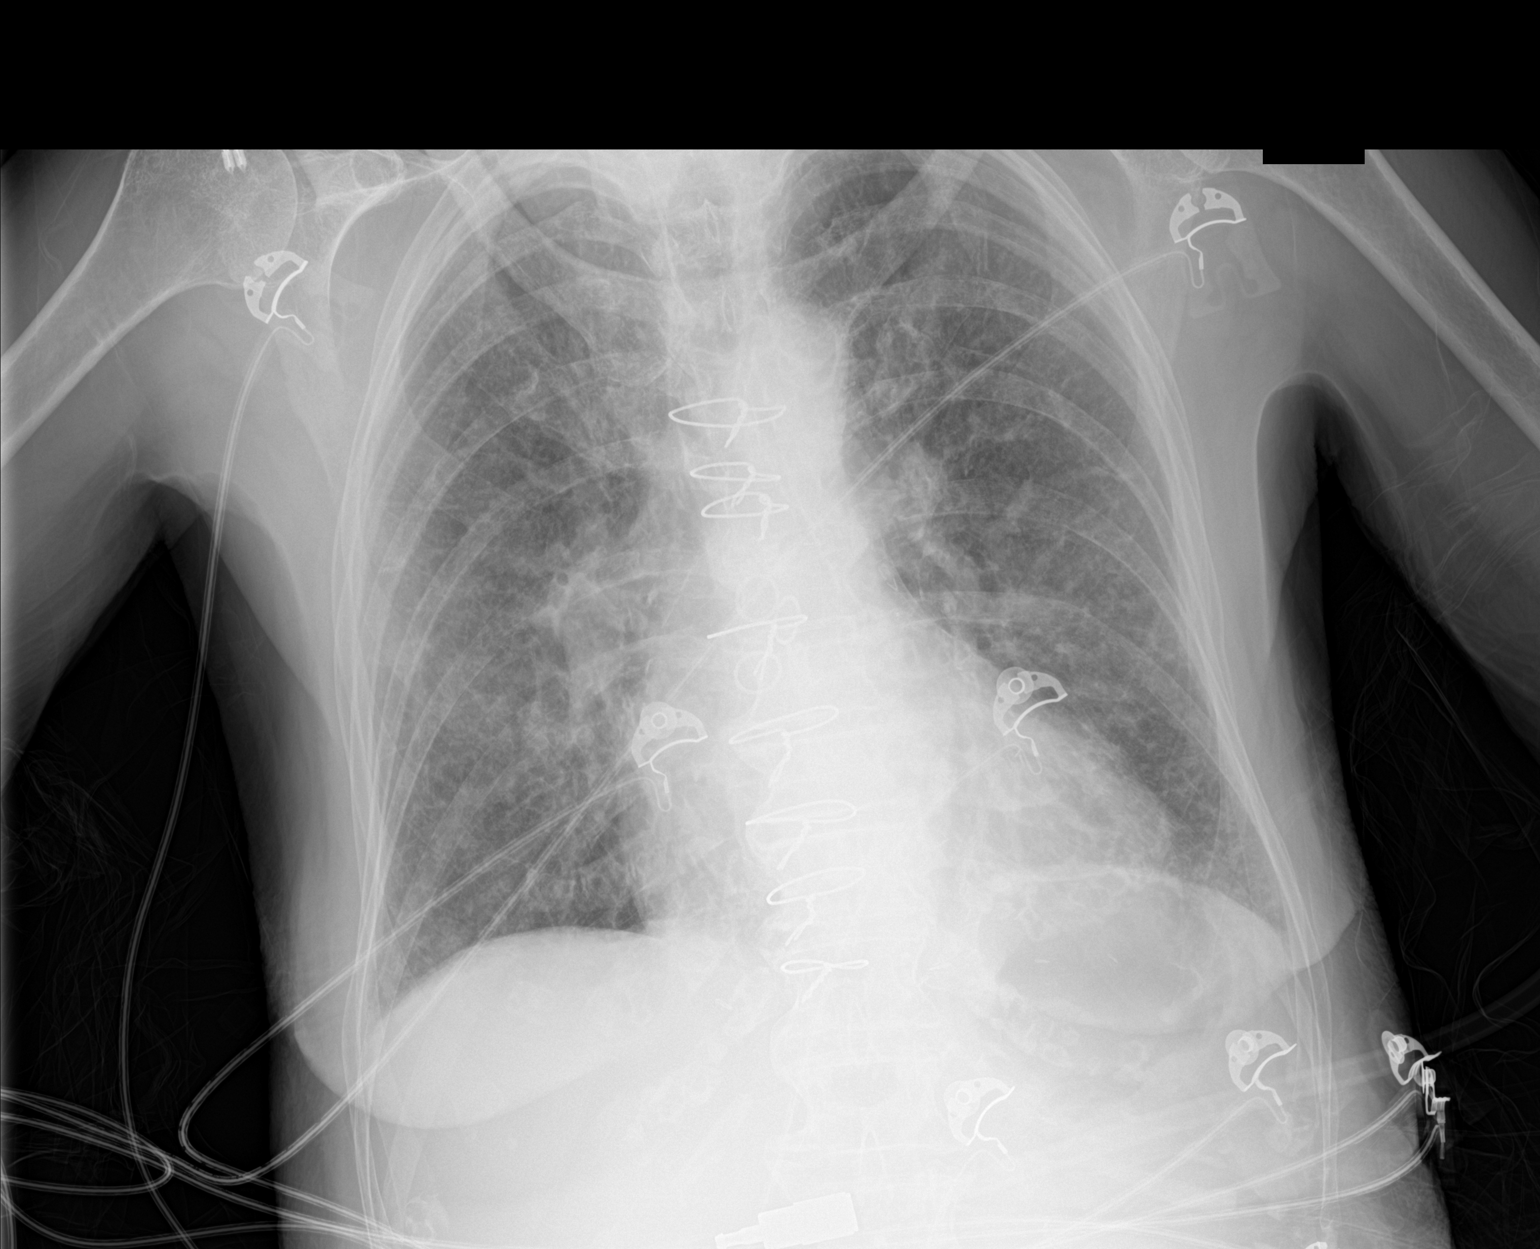

[1 of 1 positions shown; findings below may reference images not displayed]

FINDINGS: Shallow lung inflation. Cardiomediastinal contours are unchanged. No
pleural effusion or pneumothorax.

Diffusely increased lung markings are unchanged. No focal airspace
consolidation. No evidence of pulmonary edema. Mild right hilar
prominence is similar to the prior study.
IMPRESSION: No focal airspace disease.

## 2017-06-08 IMAGING — CT CT HEAD W/O CM
3 series · 16 of 46 positions shown, 19 images · non-contrast
Comparison: 02/19/2016

CLINICAL DATA: Confusion

EXAM:
CT HEAD WITHOUT CONTRAST
TECHNIQUE: Contiguous axial images were obtained from the base of the skull
through the vertex without intravenous contrast.

[Series 2: head wo · axial · 0.38mm/px · z∈[-201,-81]mm · 10 of 29 slices shown, 13 images]
[im 3/29  brain]
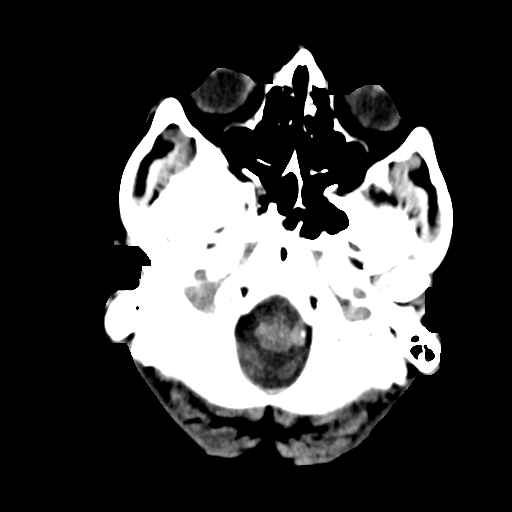
[im 3/29  bone]
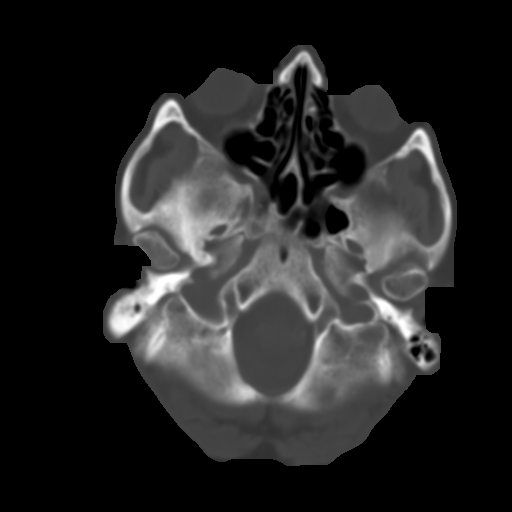
[im 6/29  brain]
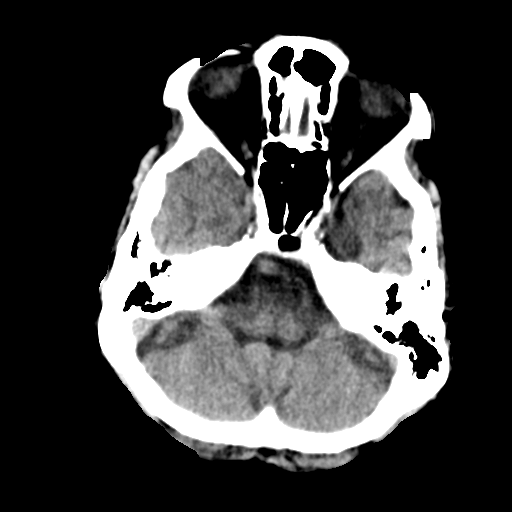
[im 8/29  brain]
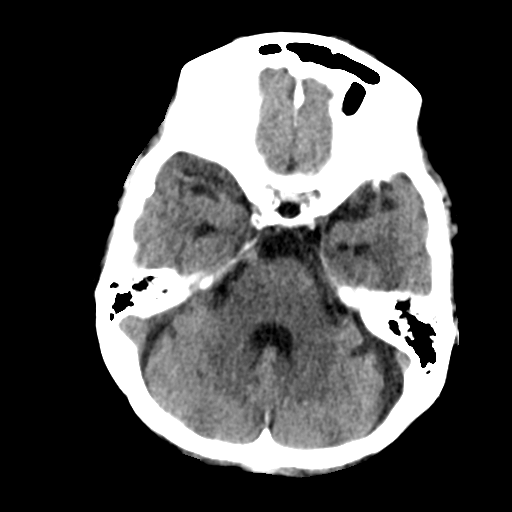
[im 11/29  brain]
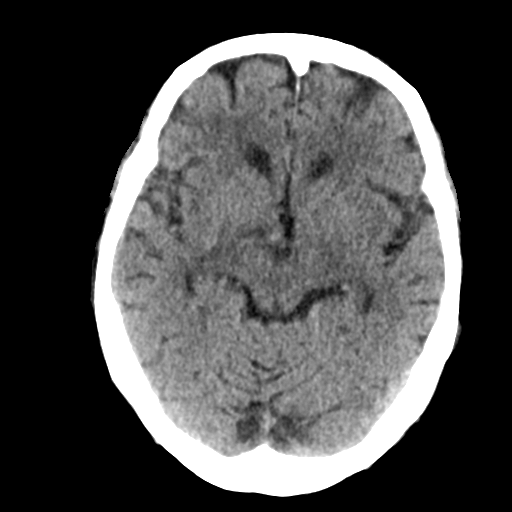
[im 14/29  brain]
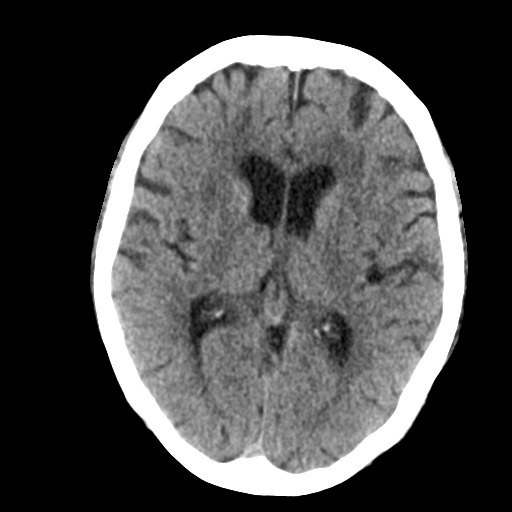
[im 14/29  bone]
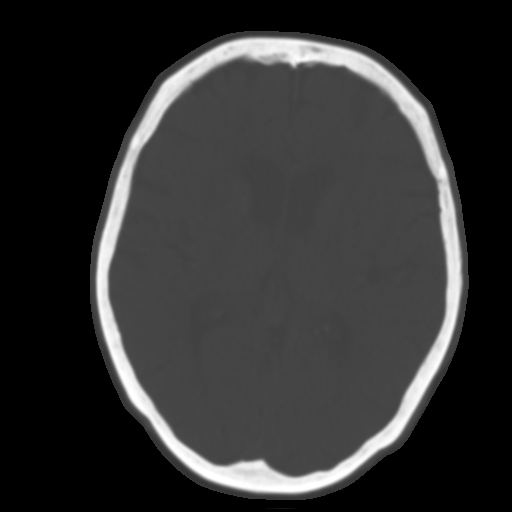
[im 16/29  brain]
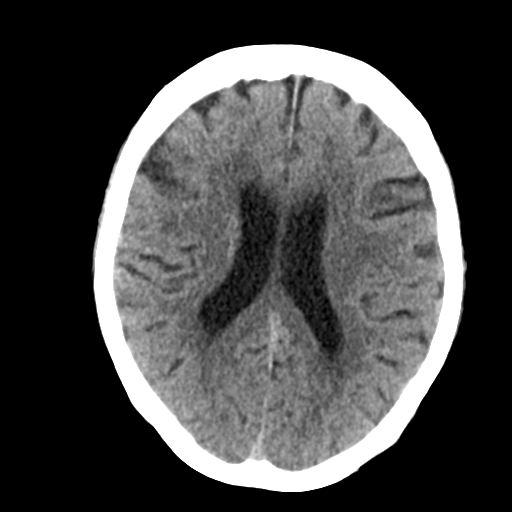
[im 19/29  brain]
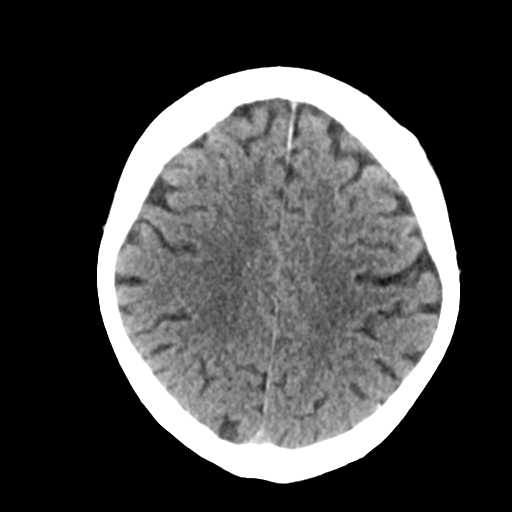
[im 22/29  brain]
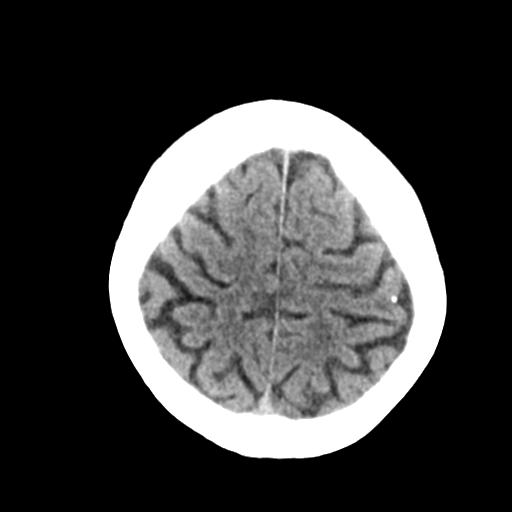
[im 24/29  brain]
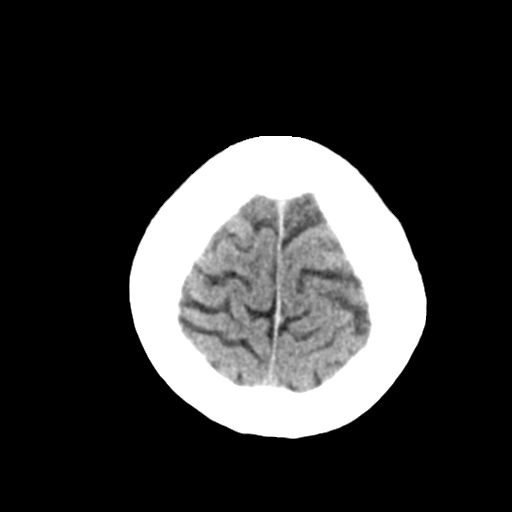
[im 24/29  bone]
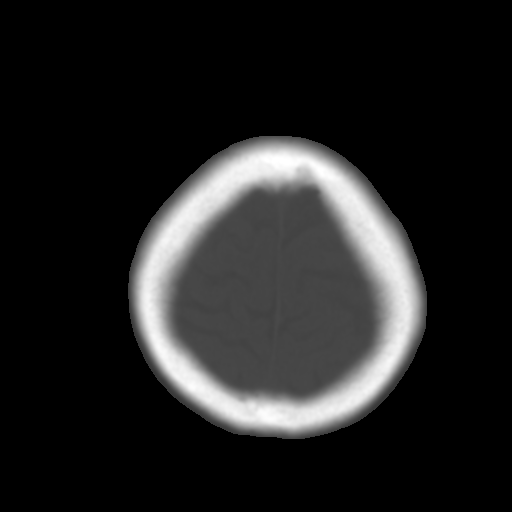
[im 27/29  brain]
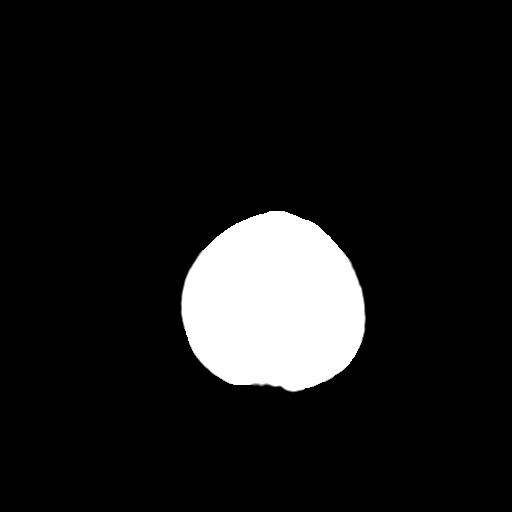

[Series 4: coronal soft tissue · coronal · 0.29mm/px · 3 of 62 slices shown]
[im 21/62  brain]
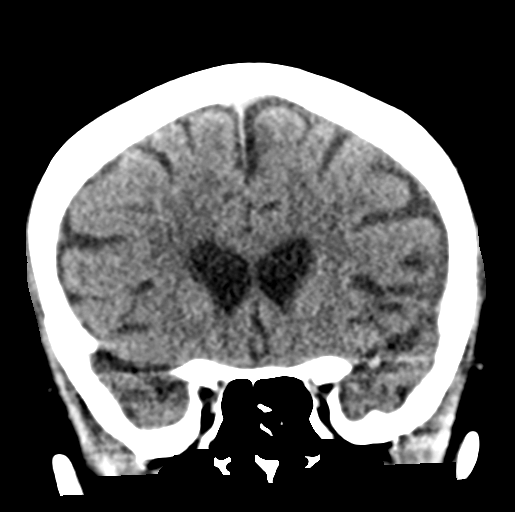
[im 28/62  brain]
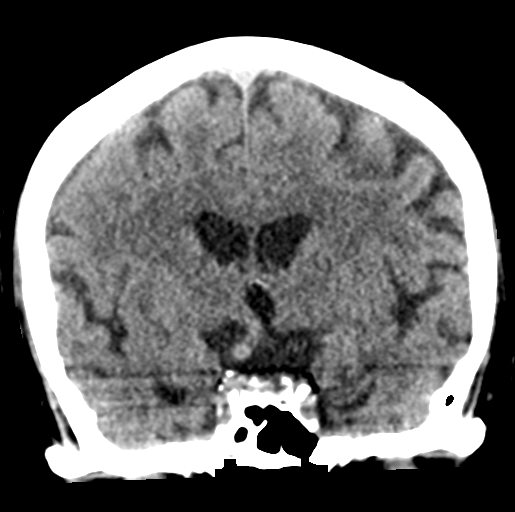
[im 34/62  brain]
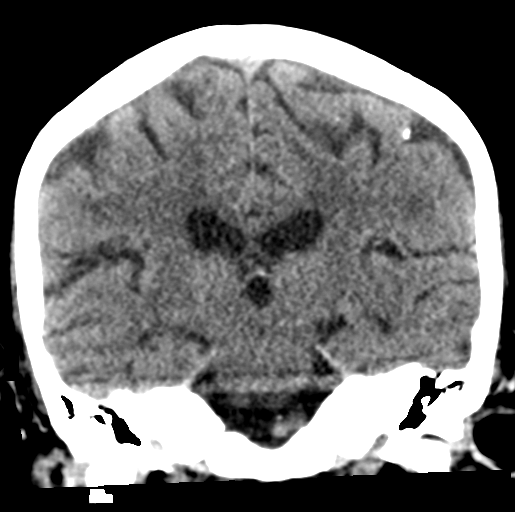

[Series 5: sagittal soft tissue · sagittal · 0.29mm/px · 3 of 51 slices shown]
[im 17/51  brain]
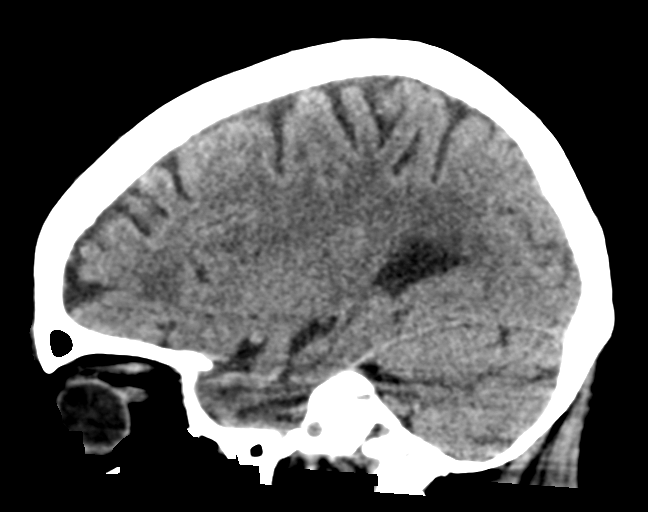
[im 26/51  brain]
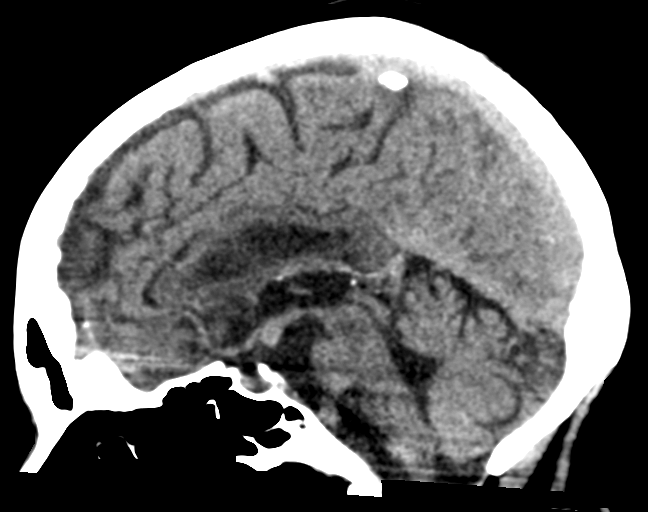
[im 34/51  brain]
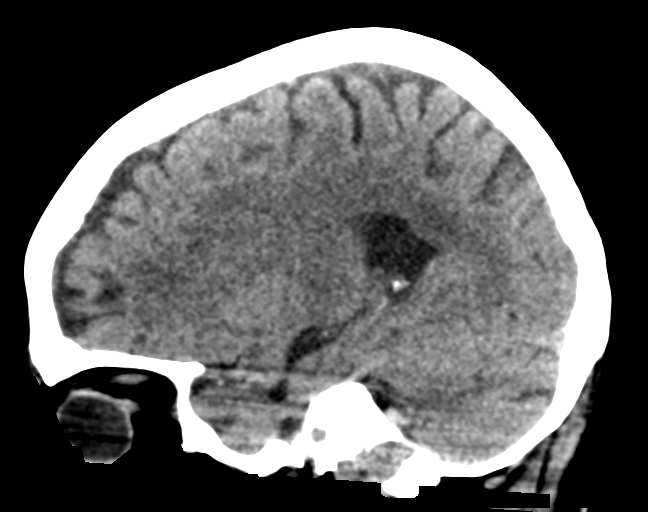

[16 of 46 positions shown; findings below may reference images not displayed]

FINDINGS: Bony calvarium is intact. Diffuse atrophic changes are noted. Mild
chronic white matter ischemic change is seen. No findings to suggest
acute hemorrhage, acute infarction or space-occupying mass lesion
are noted.
IMPRESSION: Chronic atrophic and ischemic changes without acute abnormality.

## 2017-06-08 NOTE — Progress Notes (Signed)
Cardiology Office Note  Date:  06/10/2017   ID:  Storm Dulski Kuznia, DOB 07-19-1919, MRN [id number]  PCP:  Tonia Ghent, MD   Chief Complaint  Patient presents with  . other    6 month f/u c/o lip sore. Meds reviewed verbally with pt.    HPI:  Eutha Cude Doro is a 83 y.o. Female with a history of ischemic heart disease,  coronary artery disease,  coronary artery bypass graft surgery in 7169,    diastolic congestive heart failure  high cholesterol,  GERD, hypothyroidism,  carotid arterial disease with successful left carotid endarterectomy on 05/19/2013,  Ultrasound October 2017 done in [place] showing 40% or less stenosis bilaterally,  Prominent bruit on the right Hyponatremia, weakness Diabetes Pulmonary fibrosis, prior history of pneumonias Who presents for follow-up of her coronary artery disease, hyponatremia  In follow-up today she reports that she is doing well with no complaints She is unaware she is having any sodium issues Review of lab work shows stable sodium in the low 130 range Denies any recent falls Son previously reported she was not eating well, only drinking water Lasix previously held after hospitalization. Sodium seems to have improved since then  No regular exercise program Previous carotid ultrasound one year ago 40% bilateral  Lives by herself in [place], son helps her  Denies any significant leg edema, shortness of breath, PND orthopnea Legs still weak  EKG personally reviewed by myself on todays visit normal sinus rhythm with rate 86 bpm, nonspecific ST abnormality, old inferior MI  Other past medical history reviewed lab work 04/16/16 showed Low sodium 126 In the hospital sodium 124, was given IV fluids Head CT scan showing atrophy no acute changes  d/c 04/25/16  Other past medical history reviewed 08/10/13 presented to the office with history of crescendo angina.   left heart cardiac catheterization  showed that the saphenous vein graft to the  obtuse marginal was occluded and the body of the graft with a large thrombus burden she was found to have a patent LIMA to the LAD, patent saphenous vein graft to the diagonal, and patent saphenous vein graft to the posterior descending. Her troponin peaked at 6.44. She was not a candidate for PCI and medical therapy was recommended.   good LV function and showed moderate 2+ mitral regurgitation.   echocardiogram  08/11/13 which showed ejection fraction in the range of 50-55% with no regional wall motion abnormalities and she did have grade 2 diastolic dysfunction. Her pulmonary artery pressure  51.  history of falls, not on  Plavix   PMH:   has a past medical history of Allergic rhinitis, cause unspecified; Anemia; Asthma; Atrial fibrillation ([place]); CAD (coronary artery disease); Carotid arterial disease ([place]); Chronic diastolic CHF (congestive heart failure) ([place]); Esophageal reflux; History of TIA (transient ischemic attack); Hyperlipidemia; Hypertension; Hypothyroidism; Myocardial infarction [facility]); Type II or unspecified type diabetes mellitus without mention of complication, not stated as uncontrolled; and Unspecified hearing loss.  PSH:    Past Surgical History:  Procedure Laterality Date  . ABDOMINAL HYSTERECTOMY    . Bladder tack     x 2  . CARDIAC CATHETERIZATION  09/07/1999   EF 25%. THERE IS SEVERE MITRAL ANNULAR CALIFICATION, WITH  3 + INSUFFICIENCY  . CARDIOVASCULAR STRESS TEST  06/30/2007   EF 63%, NO ISCHEMIA  . CAROTID ENDARTERECTOMY Left 05-19-13   cea  . CHOLECYSTECTOMY  10/2001  . CORONARY ARTERY BYPASS GRAFT  2000   4 vessel  . DILATION  AND CURETTAGE OF UTERUS    . ENDARTERECTOMY Left 05/19/2013   Procedure: ENDARTERECTOMY CAROTID-LEFT;  Surgeon: Elam Dutch, MD;  Location: [facility];  Service: Vascular;  Laterality: Left;  . EYE SURGERY Bilateral    Cataract  . HEMORRHOID SURGERY    . LEFT HEART CATH  Dec. 2, 2014   With  Coronary Graft  Angiogram  . LEFT HEART  CATHETERIZATION WITH CORONARY/GRAFT ANGIOGRAM  08/10/2013   Procedure: LEFT HEART CATHETERIZATION WITH Beatrix Fetters;  Surgeon: Peter M Martinique, MD;  Location: [facility] CATH LAB;  Service: Cardiovascular;;  . PARTIAL HYSTERECTOMY    . RECTOCELE REPAIR     x 2  . ROTATOR CUFF REPAIR    . TONSILLECTOMY    . US ECHOCARDIOGRAPHY  01/17/2005   EF 55-60%    Current Outpatient Prescriptions  Medication Sig Dispense Refill  . aspirin 81 MG EC tablet Take 81 mg by mouth daily.      [name] atenolol (TENORMIN) 25 MG tablet TAKE 1/2 TABLET EVERY DAY 90 tablet 1  . BIOTIN PO Take 1 capsule by mouth daily.     . digoxin (DIGOX) 0.125 MG tablet Take 1 tablet (125 mcg total) by mouth daily. 90 tablet 3  . isosorbide mononitrate (IMDUR) 30 MG 24 hr tablet TAKE ONE TABLET EVERY DAY 30 tablet 3  . levothyroxine (SYNTHROID, LEVOTHROID) 100 MCG tablet Take 1 tablet (100 mcg total) by mouth daily. 90 tablet 3  . losartan (COZAAR) 25 MG tablet TAKE ONE TABLET EVERY DAY 30 tablet 3  . Multiple Vitamins-Minerals (PRESERVISION AREDS PO) Take 1 tablet by mouth daily.    . nitroGLYCERIN (NITROSTAT) 0.4 MG SL tablet PLACE 1 TABLET UNDER THE TONGUE EVERY 5 MINUTES AS NEEDED FOR CHEST PAIN. 100 tablet PRN  . pantoprazole (PROTONIX) 40 MG tablet TAKE ONE TABLET EVERY DAY 90 tablet 1  . QUEtiapine (SEROQUEL) 25 MG tablet TAKE ONE TABLET BY MOUTH EVERY EVENING AT BEDTIME 90 tablet 3  . simvastatin (ZOCOR) 40 MG tablet TAKE ONE TABLET EVERY DAY 30 tablet 5  . vitamin B-12 (CYANOCOBALAMIN) 1000 MCG tablet Take 1,000 mcg by mouth daily.      No current facility-administered medications for this visit.      Allergies:   Furosemide and Penicillins   Social History:  The patient  reports that she has never smoked. She has never used smokeless tobacco. She reports that she does not drink alcohol or use drugs.   Family History:   family history includes Alzheimer's disease in her mother; Cancer in her brother; Heart attack in  her father; Heart disease in her father and son; Hypertension in her mother.    Review of Systems: Review of Systems  Constitutional: Negative.   Respiratory: Negative.   Cardiovascular: Negative.   Gastrointestinal: Negative.   Musculoskeletal: Negative.   Neurological: Negative.   Psychiatric/Behavioral: Negative.   All other systems reviewed and are negative.    PHYSICAL EXAM: VS:  BP (!) 148/60 (BP Location: Left Arm, Patient Position: Sitting, Cuff Size: Normal)   Pulse 86   Ht 5' 3.5" (1.613 m)   Wt 127 lb 4 oz (57.7 kg)   BMI 22.19 kg/m  , BMI Body mass index is 22.19 kg/m. No significant change in her exam compared to previous visit GEN: Elderly, thin, frail,  in no acute distress  HEENT: normal  Neck: no JVD, 2+ carotid bruit on the right, no masses Cardiac: RRR; no murmurs, rubs, or gallops,no edema  Respiratory:  Rales at the bases, worse on the right than the left normal work of breathing GI: soft, nontender, nondistended, + BS MS: no deformity or atrophy  Skin: warm and dry, no rash Neuro:  Strength and sensation are intact Psych: euthymic mood, full affect    Recent Labs: 12/23/2016: ALT 16 04/04/2017: BUN 27; Creat 1.14; Hemoglobin 11.0; Platelets 197; Potassium 4.8; Sodium 131; TSH 6.86    Lipid Panel Lab Results  Component Value Date   CHOL 142 06/09/2015   HDL 30.90 (L) 06/09/2015   LDLCALC 79 08/11/2013   TRIG 342.0 (H) 06/09/2015      Wt Readings from Last 3 Encounters:  06/10/17 127 lb 4 oz (57.7 kg)  04/04/17 124 lb 12 oz (56.6 kg)  01/08/17 122 lb 8 oz (55.6 kg)       ASSESSMENT AND PLAN:   Coronary artery disease involving native coronary artery of native heart without angina pectoris - Plan: EKG 12-Lead Currently with no symptoms of angina. No further workup at this time. Continue current medication regimen.Stable  Carotid stenosis Significant bruit on the right   carotid ultrasound detailing 40% or less on the right October  2017, done in [place]  Atherosclerosis of native coronary artery of native heart without angina pectoris  Denies any anginal symptoms No further testing  Hyperlipidemia Continue simvastatin Most recent lipid panel not available, previously at goal  Chronic diastolic CHF (congestive heart failure) ([facility]) Currently not on Lasix, appears euvolemic  PAF (paroxysmal atrial fibrillation) ([facility]) Maintaining normal sinus rhythm. On aspirin, beta blocker/atenolol, digoxin  We will recommend that she hold the digoxin as she is maintaining normal sinus rhythm Increase atenolol up to 25 mg daily  Hyponatremia Low-sodium in the past when she had anorexia, drinking lots of water on Lasix    Total encounter time more than 25 minutes  Greater than 50% was spent in counseling and coordination of care with the patient    Disposition:   F/U  12 months   No orders of the defined types were placed in this encounter.    Signed, [place] Plants, M.D., Ph.D. 06/10/2017  [place], [place]

## 2017-06-10 ENCOUNTER — Telehealth: Payer: Self-pay | Admitting: *Deleted

## 2017-06-10 ENCOUNTER — Ambulatory Visit (INDEPENDENT_AMBULATORY_CARE_PROVIDER_SITE_OTHER): Payer: Medicare Other | Admitting: Cardiovascular Disease

## 2017-06-10 ENCOUNTER — Encounter: Payer: Self-pay | Admitting: Cardiovascular Disease

## 2017-06-10 VITALS — BP 148/60 | HR 86 | Ht 63.5 in | Wt 127.2 lb

## 2017-06-10 DIAGNOSIS — I779 Disorder of arteries and arterioles, unspecified: Secondary | ICD-10-CM

## 2017-06-10 DIAGNOSIS — I5032 Chronic diastolic (congestive) heart failure: Secondary | ICD-10-CM

## 2017-06-10 DIAGNOSIS — E782 Mixed hyperlipidemia: Secondary | ICD-10-CM

## 2017-06-10 DIAGNOSIS — I480 Paroxysmal atrial fibrillation: Secondary | ICD-10-CM

## 2017-06-10 DIAGNOSIS — I25118 Atherosclerotic heart disease of native coronary artery with other forms of angina pectoris: Secondary | ICD-10-CM | POA: Diagnosis not present

## 2017-06-10 DIAGNOSIS — I209 Angina pectoris, unspecified: Secondary | ICD-10-CM

## 2017-06-10 DIAGNOSIS — I6522 Occlusion and stenosis of left carotid artery: Secondary | ICD-10-CM

## 2017-06-10 DIAGNOSIS — I739 Peripheral vascular disease, unspecified: Secondary | ICD-10-CM

## 2017-06-10 MED ORDER — ATENOLOL 25 MG PO TABS: 25.0000 mg | ORAL_TABLET | Freq: Every day | ORAL | 3 refills | Status: DC

## 2017-06-10 NOTE — Patient Instructions (Signed)

## 2017-06-10 NOTE — Telephone Encounter (Addendum)
error 

## 2017-06-10 NOTE — Telephone Encounter (Deleted)
-----   Message from Minna Merritts, MD sent at 06/10/2017  1:32 PM EDT ----- Continue make several medication changes Hold the digoxin, no longer needed Increase atenolol up to 25 mg daily thx TG

## 2017-06-27 ENCOUNTER — Ambulatory Visit: Payer: Self-pay | Admitting: Cardiovascular Disease

## 2017-08-12 ENCOUNTER — Inpatient Hospital Stay: Payer: Medicare Other

## 2017-08-12 ENCOUNTER — Other Ambulatory Visit: Payer: Self-pay

## 2017-08-12 ENCOUNTER — Emergency Department: Payer: Medicare Other

## 2017-08-12 ENCOUNTER — Inpatient Hospital Stay
Admission: EM | Admit: 2017-08-12 | Discharge: 2017-08-16 | DRG: 564 | Disposition: A | Discharge status: Skilled Nursing Facility | Payer: Medicare Other | Attending: Internal Medicine | Admitting: Internal Medicine

## 2017-08-12 ENCOUNTER — Encounter: Payer: Self-pay | Admitting: Emergency Medicine

## 2017-08-12 DIAGNOSIS — I110 Hypertensive heart disease with heart failure: Secondary | ICD-10-CM | POA: Diagnosis present

## 2017-08-12 DIAGNOSIS — I5033 Acute on chronic diastolic (congestive) heart failure: Secondary | ICD-10-CM | POA: Diagnosis present

## 2017-08-12 DIAGNOSIS — T796XXA Traumatic ischemia of muscle, initial encounter: Principal | ICD-10-CM

## 2017-08-12 DIAGNOSIS — Z951 Presence of aortocoronary bypass graft: Secondary | ICD-10-CM | POA: Diagnosis not present

## 2017-08-12 DIAGNOSIS — I252 Old myocardial infarction: Secondary | ICD-10-CM | POA: Diagnosis not present

## 2017-08-12 DIAGNOSIS — J45909 Unspecified asthma, uncomplicated: Secondary | ICD-10-CM | POA: Diagnosis not present

## 2017-08-12 DIAGNOSIS — Z79899 Other long term (current) drug therapy: Secondary | ICD-10-CM

## 2017-08-12 DIAGNOSIS — E039 Hypothyroidism, unspecified: Secondary | ICD-10-CM | POA: Diagnosis present

## 2017-08-12 DIAGNOSIS — R0603 Acute respiratory distress: Secondary | ICD-10-CM | POA: Diagnosis not present

## 2017-08-12 DIAGNOSIS — E538 Deficiency of other specified B group vitamins: Secondary | ICD-10-CM | POA: Diagnosis not present

## 2017-08-12 DIAGNOSIS — I480 Paroxysmal atrial fibrillation: Secondary | ICD-10-CM | POA: Diagnosis present

## 2017-08-12 DIAGNOSIS — Z90710 Acquired absence of both cervix and uterus: Secondary | ICD-10-CM

## 2017-08-12 DIAGNOSIS — Z7982 Long term (current) use of aspirin: Secondary | ICD-10-CM

## 2017-08-12 DIAGNOSIS — E119 Type 2 diabetes mellitus without complications: Secondary | ICD-10-CM | POA: Diagnosis not present

## 2017-08-12 DIAGNOSIS — Z8673 Personal history of transient ischemic attack (TIA), and cerebral infarction without residual deficits: Secondary | ICD-10-CM | POA: Diagnosis not present

## 2017-08-12 DIAGNOSIS — K219 Gastro-esophageal reflux disease without esophagitis: Secondary | ICD-10-CM | POA: Diagnosis present

## 2017-08-12 DIAGNOSIS — R6889 Other general symptoms and signs: Secondary | ICD-10-CM | POA: Diagnosis not present

## 2017-08-12 DIAGNOSIS — J449 Chronic obstructive pulmonary disease, unspecified: Secondary | ICD-10-CM | POA: Diagnosis present

## 2017-08-12 DIAGNOSIS — R0602 Shortness of breath: Secondary | ICD-10-CM | POA: Diagnosis not present

## 2017-08-12 DIAGNOSIS — Z9049 Acquired absence of other specified parts of digestive tract: Secondary | ICD-10-CM | POA: Diagnosis not present

## 2017-08-12 DIAGNOSIS — I5032 Chronic diastolic (congestive) heart failure: Secondary | ICD-10-CM | POA: Diagnosis not present

## 2017-08-12 DIAGNOSIS — W19XXXA Unspecified fall, initial encounter: Secondary | ICD-10-CM | POA: Diagnosis not present

## 2017-08-12 DIAGNOSIS — Z79890 Hormone replacement therapy (postmenopausal): Secondary | ICD-10-CM

## 2017-08-12 DIAGNOSIS — I509 Heart failure, unspecified: Secondary | ICD-10-CM | POA: Diagnosis not present

## 2017-08-12 DIAGNOSIS — I2510 Atherosclerotic heart disease of native coronary artery without angina pectoris: Secondary | ICD-10-CM | POA: Diagnosis present

## 2017-08-12 DIAGNOSIS — Z8249 Family history of ischemic heart disease and other diseases of the circulatory system: Secondary | ICD-10-CM

## 2017-08-12 DIAGNOSIS — F0280 Dementia in other diseases classified elsewhere without behavioral disturbance: Secondary | ICD-10-CM | POA: Diagnosis not present

## 2017-08-12 DIAGNOSIS — E1136 Type 2 diabetes mellitus with diabetic cataract: Secondary | ICD-10-CM | POA: Diagnosis present

## 2017-08-12 DIAGNOSIS — W19XXXD Unspecified fall, subsequent encounter: Secondary | ICD-10-CM | POA: Diagnosis not present

## 2017-08-12 DIAGNOSIS — Z23 Encounter for immunization: Secondary | ICD-10-CM

## 2017-08-12 DIAGNOSIS — E785 Hyperlipidemia, unspecified: Secondary | ICD-10-CM | POA: Diagnosis present

## 2017-08-12 DIAGNOSIS — S0990XA Unspecified injury of head, initial encounter: Secondary | ICD-10-CM | POA: Diagnosis not present

## 2017-08-12 DIAGNOSIS — K1379 Other lesions of oral mucosa: Secondary | ICD-10-CM | POA: Diagnosis not present

## 2017-08-12 DIAGNOSIS — R339 Retention of urine, unspecified: Secondary | ICD-10-CM | POA: Diagnosis present

## 2017-08-12 DIAGNOSIS — Z880 Allergy status to penicillin: Secondary | ICD-10-CM

## 2017-08-12 DIAGNOSIS — J309 Allergic rhinitis, unspecified: Secondary | ICD-10-CM | POA: Diagnosis present

## 2017-08-12 DIAGNOSIS — E876 Hypokalemia: Secondary | ICD-10-CM | POA: Diagnosis present

## 2017-08-12 DIAGNOSIS — R748 Abnormal levels of other serum enzymes: Secondary | ICD-10-CM | POA: Diagnosis present

## 2017-08-12 DIAGNOSIS — F0390 Unspecified dementia without behavioral disturbance: Secondary | ICD-10-CM | POA: Diagnosis present

## 2017-08-12 DIAGNOSIS — H9190 Unspecified hearing loss, unspecified ear: Secondary | ICD-10-CM | POA: Diagnosis present

## 2017-08-12 DIAGNOSIS — S199XXA Unspecified injury of neck, initial encounter: Secondary | ICD-10-CM | POA: Diagnosis not present

## 2017-08-12 DIAGNOSIS — R195 Other fecal abnormalities: Secondary | ICD-10-CM | POA: Diagnosis not present

## 2017-08-12 DIAGNOSIS — S79911A Unspecified injury of right hip, initial encounter: Secondary | ICD-10-CM | POA: Diagnosis not present

## 2017-08-12 DIAGNOSIS — M6282 Rhabdomyolysis: Secondary | ICD-10-CM | POA: Diagnosis present

## 2017-08-12 DIAGNOSIS — K922 Gastrointestinal hemorrhage, unspecified: Secondary | ICD-10-CM | POA: Diagnosis not present

## 2017-08-12 DIAGNOSIS — T796XXS Traumatic ischemia of muscle, sequela: Secondary | ICD-10-CM | POA: Diagnosis not present

## 2017-08-12 DIAGNOSIS — T796XXD Traumatic ischemia of muscle, subsequent encounter: Secondary | ICD-10-CM | POA: Diagnosis not present

## 2017-08-12 DIAGNOSIS — M6281 Muscle weakness (generalized): Secondary | ICD-10-CM | POA: Diagnosis not present

## 2017-08-12 DIAGNOSIS — Z888 Allergy status to other drugs, medicaments and biological substances status: Secondary | ICD-10-CM

## 2017-08-12 DIAGNOSIS — M25551 Pain in right hip: Secondary | ICD-10-CM | POA: Diagnosis not present

## 2017-08-12 LAB — URINALYSIS, COMPLETE (UACMP) WITH MICROSCOPIC
BACTERIA UA: NONE SEEN
Bilirubin Urine: NEGATIVE
Glucose, UA: NEGATIVE mg/dL
KETONES UR: NEGATIVE mg/dL
Leukocytes, UA: NEGATIVE
Nitrite: NEGATIVE
PH: 5 (ref 5.0–8.0)
PROTEIN: 100 mg/dL — AB
Specific Gravity, Urine: 1.013 (ref 1.005–1.030)

## 2017-08-12 LAB — TYPE AND SCREEN
ABO/RH(D): A POS
ANTIBODY SCREEN: NEGATIVE

## 2017-08-12 LAB — CBC
HCT: 39.7 % (ref 35.0–47.0)
HEMOGLOBIN: 13.4 g/dL (ref 12.0–16.0)
MCH: 29.8 pg (ref 26.0–34.0)
MCHC: 33.9 g/dL (ref 32.0–36.0)
MCV: 88 fL (ref 80.0–100.0)
Platelets: 154 10*3/uL (ref 150–440)
RBC: 4.5 MIL/uL (ref 3.80–5.20)
RDW: 14.5 % (ref 11.5–14.5)
WBC: 18.7 10*3/uL — ABNORMAL HIGH (ref 3.6–11.0)

## 2017-08-12 LAB — TROPONIN I
Troponin I: 0.07 ng/mL (ref <0.03)
Troponin I: 0.06 ng/mL (ref <0.03)

## 2017-08-12 LAB — BASIC METABOLIC PANEL
Anion gap: 14 (ref 5–15)
BUN: 31 mg/dL — AB (ref 6–20)
CALCIUM: 8.7 mg/dL — AB (ref 8.9–10.3)
CHLORIDE: 98 mmol/L — AB (ref 101–111)
CO2: 22 mmol/L (ref 22–32)
CREATININE: 1.26 mg/dL — AB (ref 0.44–1.00)
GFR calc Af Amer: 40 mL/min — ABNORMAL LOW (ref >60)
GFR calc non Af Amer: 35 mL/min — ABNORMAL LOW (ref >60)
Glucose, Bld: 120 mg/dL — ABNORMAL HIGH (ref 65–99)
Potassium: 3.3 mmol/L — ABNORMAL LOW (ref 3.5–5.1)
SODIUM: 134 mmol/L — AB (ref 135–145)

## 2017-08-12 LAB — GLUCOSE, CAPILLARY: GLUCOSE-CAPILLARY: 103 mg/dL — AB (ref 65–99)

## 2017-08-12 LAB — CK: Total CK: 8135 U/L — ABNORMAL HIGH (ref 38–234)

## 2017-08-12 MED ORDER — ATENOLOL 25 MG PO TABS
25.0000 mg | ORAL_TABLET | Freq: Every day | ORAL | Status: DC
  Administered 2017-08-12 – 2017-08-16 (×5): 25 mg via ORAL
  Filled 2017-08-12 (×6): qty 1

## 2017-08-12 MED ORDER — LEVOTHYROXINE SODIUM 100 MCG PO TABS
100.0000 ug | ORAL_TABLET | Freq: Every day | ORAL | Status: DC
  Administered 2017-08-13 – 2017-08-16 (×4): 100 ug via ORAL
  Filled 2017-08-12 (×4): qty 1

## 2017-08-12 MED ORDER — INSULIN ASPART 100 UNIT/ML ~~LOC~~ SOLN: 0.0000 [IU] | Freq: Every day | SUBCUTANEOUS | Status: DC

## 2017-08-12 MED ORDER — INFLUENZA VAC SPLIT HIGH-DOSE 0.5 ML IM SUSY
0.5000 mL | PREFILLED_SYRINGE | INTRAMUSCULAR | Status: AC
  Administered 2017-08-16: 0.5 mL via INTRAMUSCULAR
  Filled 2017-08-12: qty 0.5

## 2017-08-12 MED ORDER — DIGOXIN 125 MCG PO TABS
125.0000 ug | ORAL_TABLET | Freq: Every day | ORAL | Status: DC
  Administered 2017-08-12 – 2017-08-16 (×5): 125 ug via ORAL
  Filled 2017-08-12 (×6): qty 1

## 2017-08-12 MED ORDER — SODIUM CHLORIDE 0.9 % IV SOLN
INTRAVENOUS | Status: AC
Start: 2017-08-12 — End: 2017-08-13
  Administered 2017-08-12 – 2017-08-13 (×3): via INTRAVENOUS

## 2017-08-12 MED ORDER — SIMVASTATIN 40 MG PO TABS
40.0000 mg | ORAL_TABLET | Freq: Every day | ORAL | Status: DC
  Administered 2017-08-12 – 2017-08-13 (×2): 40 mg via ORAL
  Filled 2017-08-12 (×3): qty 1

## 2017-08-12 MED ORDER — QUETIAPINE FUMARATE 25 MG PO TABS
25.0000 mg | ORAL_TABLET | Freq: Every day | ORAL | Status: DC
  Administered 2017-08-12 – 2017-08-15 (×4): 25 mg via ORAL
  Filled 2017-08-12 (×4): qty 1

## 2017-08-12 MED ORDER — SODIUM CHLORIDE 0.9 % IV SOLN
8.0000 mg/h | INTRAVENOUS | Status: DC
  Administered 2017-08-12 – 2017-08-13 (×2): 8 mg/h via INTRAVENOUS
  Filled 2017-08-12 (×2): qty 80

## 2017-08-12 MED ORDER — ISOSORBIDE MONONITRATE ER 30 MG PO TB24
30.0000 mg | ORAL_TABLET | Freq: Every day | ORAL | Status: DC
  Administered 2017-08-12 – 2017-08-16 (×5): 30 mg via ORAL
  Filled 2017-08-12 (×5): qty 1

## 2017-08-12 MED ORDER — PANTOPRAZOLE SODIUM 40 MG IV SOLR: 40.0000 mg | Freq: Two times a day (BID) | INTRAVENOUS | Status: DC

## 2017-08-12 MED ORDER — INSULIN ASPART 100 UNIT/ML ~~LOC~~ SOLN
0.0000 [IU] | Freq: Three times a day (TID) | SUBCUTANEOUS | Status: DC
  Administered 2017-08-15: 1 [IU] via SUBCUTANEOUS
  Filled 2017-08-12: qty 1

## 2017-08-12 MED ORDER — SODIUM CHLORIDE 0.9 % IV SOLN
80.0000 mg | Freq: Once | INTRAVENOUS | Status: AC
  Administered 2017-08-12: 80 mg via INTRAVENOUS
  Filled 2017-08-12: qty 80

## 2017-08-12 NOTE — ED Notes (Signed)
Brown/black dried crust noted to pt's mouth. Pt states she doesn't think she has vomited, however, substance tests POSITIVE on hemoccult. Stool sample also tests POSITIVE.

## 2017-08-12 NOTE — ED Triage Notes (Addendum)
Pt presents to ED via [facility] from home. Found on floor by pt's son who called EMS. Pt states she saw her son sometime this morning around breakfast but is unsure how long she was on the floor. States she had a fall but doesn't know why she fell. Hx dementia. Denies pain or focal weakness at this time. CBG 131 per EMS. Pt arrives soaked in urine and feces.

## 2017-08-12 NOTE — ED Provider Notes (Signed)
[facility] Emergency Department Provider Note  ____________________________________________  Time seen: Approximately 7:37 PM  I have reviewed the triage vital signs and the nursing notes.   HISTORY  Chief Complaint Weakness and Fall    HPI Jill Howell is a 83 y.o. female with a history of dementia, CAD, atrial fibrillation not on anticoagulation, diastolic CHF, TIA, hypertension, hyperlipidemia, diabetes who presents for evaluation of unwitnessed fall.patient lives alone and does not recall falling. Son found her on the ground this evening. Unknown how long she was on the ground. Patient found to have dried coffee ground emesis around her mouth, does not remember vomiting. Son does not recall seeing blood near the patient on the ground. No h/o GIB, not on blood thinners. Patient has no complaints at this time and denies HA, neck pain, CP, back pain, extremity pain. Patient arrived in the ED covered in stool and urine./  Past Medical History:  Diagnosis Date  . Allergic rhinitis, cause unspecified   . Anemia   . Asthma   . Atrial fibrillation ([place])    a. remote->no anticoagulation 2/2 h/o falls.  [name] CAD (coronary artery disease)    a. 2000 s/p MI/CABG x 4 (VG-PDA, VG-OM, VG-Diag, LIMA-LAD);  b. 2011 MI;  c. 08/2013 NSTEMI/Cath: LM 90-95, LAD 90/100p, LCX 99p, 179m, RCA 100ost, VG-PDA nl, VG-OM 100 (culprit w/ thrombus), VG-Diag nl, LIMA-LAD nl, EF 55-60%, mild inf HK, mod MR-->Med Rx.  . Carotid arterial disease ([place])    a. 05/2013 s/p L CEA.  . Chronic diastolic CHF (congestive heart failure) ([place])    a. 08/2013 Echo: EF 50-55%, no rwma, Gr2 DD, mod MR, mildly dil LA, PASP 47mmHg.  [name] Esophageal reflux   . History of TIA (transient ischemic attack)   . Hyperlipidemia   . Hypertension   . Hypothyroidism   . Myocardial infarction ([place])    08-2013  . Type II or unspecified type diabetes mellitus without mention of complication, not stated as uncontrolled    . Unspecified hearing loss     Patient Active Problem List   Diagnosis Date Noted  . Rhabdomyolysis 08/12/2017  . Advance care planning 01/08/2017  . Hx of CABG 12/26/2016  . Cough 04/28/2016  . Hyperkalemia 04/22/2016  . Other fatigue 04/17/2016  . Pressure ulcer 02/20/2016  . Atherosclerosis of native coronary artery of native heart with stable angina pectoris ([place]) 02/19/2016  . Dementia 02/19/2016  . Hyponatremia 02/19/2016  . Chronic diastolic congestive heart failure ([place]) 02/19/2016  . Carotid stenosis 06/09/2014  . Asthmatic bronchitis , chronic ([place]) 03/24/2014  . Family discord 02/18/2014  . Aftercare following surgery of the circulatory system, [place] 12/07/2013  . Malaise and fatigue 12/01/2013  . Benign hypertensive heart disease without heart failure 08/17/2013  . At risk for falls 08/12/2013  . Anemia   . TIA (transient ischemic attack) 05/17/2013  . Occlusion and stenosis of carotid artery without mention of cerebral infarction 05/13/2013  . Fall at home 05/11/2013  . PAF (paroxysmal atrial fibrillation) ([place]) 08/11/2012  . Carotid artery disease ([place]) 08/19/2011  . Dermatitis 05/09/2011  . Hypothyroidism 09/15/2007  . History of diabetes mellitus, type II 09/15/2007  . Hyperlipidemia 09/15/2007  . HEARING IMPAIRMENT 09/15/2007  . Coronary atherosclerosis 09/15/2007  . Congestive heart failure ([place]) 09/15/2007  . ALLERGIC RHINITIS 09/15/2007  . GERD 09/15/2007    Past Surgical History:  Procedure Laterality Date  . ABDOMINAL HYSTERECTOMY    . Bladder tack     x  2  . CARDIAC CATHETERIZATION  09/07/1999   EF 25%. THERE IS SEVERE MITRAL ANNULAR CALIFICATION, WITH  3 + INSUFFICIENCY  . CARDIOVASCULAR STRESS TEST  06/30/2007   EF 63%, NO ISCHEMIA  . CAROTID ENDARTERECTOMY Left 05-19-13   cea  . CHOLECYSTECTOMY  10/2001  . CORONARY ARTERY BYPASS GRAFT  2000   4 vessel  . DILATION AND CURETTAGE OF UTERUS    . ENDARTERECTOMY Left 05/19/2013   Procedure:  ENDARTERECTOMY CAROTID-LEFT;  Surgeon: Elam Dutch, MD;  Location: [place];  Service: Vascular;  Laterality: Left;  . EYE SURGERY Bilateral    Cataract  . HEMORRHOID SURGERY    . LEFT HEART CATH  Dec. 2, 2014   With  Coronary Graft  Angiogram  . LEFT HEART CATHETERIZATION WITH CORONARY/GRAFT ANGIOGRAM  08/10/2013   Procedure: LEFT HEART CATHETERIZATION WITH Beatrix Fetters;  Surgeon: Peter M Martinique, MD;  Location: [facility] CATH LAB;  Service: Cardiovascular;;  . PARTIAL HYSTERECTOMY    . RECTOCELE REPAIR     x 2  . ROTATOR CUFF REPAIR    . TONSILLECTOMY    . US ECHOCARDIOGRAPHY  01/17/2005   EF 55-60%    Prior to Admission medications   Medication Sig Start Date End Date Taking? Authorizing Provider  aspirin 81 MG EC tablet Take 81 mg by mouth daily.      [provider]  atenolol (TENORMIN) 25 MG tablet Take 1 tablet (25 mg total) by mouth daily. 06/10/17   Minna Merritts, MD  BIOTIN PO Take 1 capsule by mouth daily.     [provider]  [place] 125 MCG tablet Take 1 tablet by mouth daily. 05/30/17   [provider]  isosorbide mononitrate (IMDUR) 30 MG 24 hr tablet TAKE ONE TABLET EVERY DAY 02/04/17   Minna Merritts, MD  levothyroxine (SYNTHROID, LEVOTHROID) 100 MCG tablet Take 1 tablet (100 mcg total) by mouth daily. 01/08/17   Tonia Ghent, MD  losartan (COZAAR) 25 MG tablet TAKE ONE TABLET EVERY DAY 02/04/17   Minna Merritts, MD  Multiple Vitamins-Minerals (PRESERVISION AREDS PO) Take 1 tablet by mouth daily.    [provider]  nitroGLYCERIN (NITROSTAT) 0.4 MG SL tablet PLACE 1 TABLET UNDER THE TONGUE EVERY 5 MINUTES AS NEEDED FOR CHEST PAIN. 03/01/15   Darlin Coco, MD  pantoprazole (PROTONIX) 40 MG tablet TAKE ONE TABLET EVERY DAY 04/09/17   Tonia Ghent, MD  QUEtiapine (SEROQUEL) 25 MG tablet TAKE ONE TABLET BY MOUTH EVERY EVENING AT BEDTIME 01/08/17   Tonia Ghent, MD  simvastatin (ZOCOR) 40 MG tablet TAKE ONE TABLET EVERY  DAY 05/30/17   Tonia Ghent, MD  vitamin B-12 (CYANOCOBALAMIN) 1000 MCG tablet Take 1,000 mcg by mouth daily.     [provider]    Allergies Furosemide and Penicillins  Family History  Problem Relation Age of Onset  . Heart attack Father        X's 3  . Heart disease Father   . Heart disease Son        before age 44  . Cancer Brother   . Hypertension Mother   . Alzheimer's disease Mother     Social History Social History   Tobacco Use  . Smoking status: Never Smoker  . Smokeless tobacco: Never Used  Substance Use Topics  . Alcohol use: No    Alcohol/week: 0.0 oz  . Drug use: No    Review of Systems Constitutional: Negative for fever. Eyes:  Negative for visual changes. ENT: Negative for facial injury or neck injury Cardiovascular: Negative for chest injury. Respiratory: Negative for shortness of breath. Negative for chest wall injury. Gastrointestinal: Negative for abdominal pain or injury. Genitourinary: Negative for dysuria. Musculoskeletal: Negative for back injury, negative for arm or leg pain. Skin: Negative for laceration/abrasions. Neurological: Negative for head injury.  ____________________________________________   PHYSICAL EXAM:  VITAL SIGNS: ED Triage Vitals  Enc Vitals Group     BP 08/12/17 1705 (!) 123/54     Pulse Rate 08/12/17 1705 84     Resp 08/12/17 1705 19     Temp 08/12/17 1708 (!) 97.5 F (36.4 C)     Temp Source 08/12/17 1708 Oral     SpO2 08/12/17 1705 94 %     Weight 08/12/17 1709 130 lb (59 kg)     Height 08/12/17 1709 5\' 4"  (1.626 m)     Head Circumference --      Peak Flow --      Pain Score --      Pain Loc --      Pain Edu? --      Excl. in [place]? --      Constitutional: Alert and oriented x 2. No acute distress. Does not appear intoxicated. HEENT Head: Normocephalic and atraumatic. Face: No facial bony tenderness. Stable midface Ears: No hemotympanum bilaterally. No Battle sign Eyes: No eye injury.  PERRL. No raccoon eyes Nose: Nontender. No epistaxis. No rhinorrhea Mouth/Throat: Mucous membranes are moist. No oropharyngeal blood. No dental injury. Airway patent without stridor. Normal voice. Dark emesis on the sides of her mouth guaiac positive Neck: no C-collar in place. No midline c-spine tenderness.  Cardiovascular: Normal rate, regular rhythm. Normal and symmetric distal pulses are present in all extremities. Pulmonary/Chest: Chest wall is stable and nontender to palpation/compression. Normal respiratory effort. Breath sounds are normal. No crepitus.  Abdominal: Soft, nontender, non distended. Rectal exam showing brown stool grossly guaiac positive Musculoskeletal: Patient with pain with internal rotation of the R hip. Nontender with normal full range of motion in all other extremities. No deformities. No thoracic or lumbar midline spinal tenderness. Pelvis is stable. Skin: Skin is warm, dry and intact. No abrasions or contutions. Psychiatric: Speech and behavior are appropriate. Neurological: Normal speech and language. Moves all extremities to command. No gross focal neurologic deficits are appreciated.  Glascow Coma Score: 4 - Opens eyes on own 6 - Follows simple motor commands 5 - Alert and oriented GCS: 15   ____________________________________________   LABS (all labs ordered are listed, but only abnormal results are displayed)  Labs Reviewed  CK - Abnormal; Notable for the following components:      Result Value   Total CK 8,135 (*)    All other components within normal limits  CBC - Abnormal; Notable for the following components:   WBC 18.7 (*)    All other components within normal limits  BASIC METABOLIC PANEL - Abnormal; Notable for the following components:   Sodium 134 (*)    Potassium 3.3 (*)    Chloride 98 (*)    Glucose, Bld 120 (*)    BUN 31 (*)    Creatinine, Ser 1.26 (*)    Calcium 8.7 (*)    GFR calc non Af Amer 35 (*)    GFR calc Af Amer 40 (*)      All other components within normal limits  URINALYSIS, COMPLETE (UACMP) WITH MICROSCOPIC - Abnormal; Notable for the following components:  Color, Urine YELLOW (*)    APPearance HAZY (*)    Hgb urine dipstick LARGE (*)    Protein, ur 100 (*)    Squamous Epithelial / LPF 0-5 (*)    All other components within normal limits  TROPONIN I - Abnormal; Notable for the following components:   Troponin I 0.07 (*)    All other components within normal limits  CK  TROPONIN I  TROPONIN I  TROPONIN I  TYPE AND SCREEN   ____________________________________________  EKG  ED ECG REPORT I, Rudene Re, the attending physician, personally viewed and interpreted this ECG.  Normal sinus rhythm, rate of 89, normal intervals, normal axis, no ST elevations or depressions. ____________________________________________  RADIOLOGY  CT head and cspine:  Atrophy with small vessel chronic ischemic changes of deep cerebral white matter.  Old lacunar infarcts within cerebellum and at LEFT caudate head.  No acute intracranial abnormalities.  No acute cervical spine abnormalities.  Tiny nodular foci at lung apices up to 4 mm diameter; patient has chronic interstitial prominence on prior chest radiographs without discrete mass/nodule. Significance of current findings is uncertain and if clinically indicated follow-up CT chest could be performed for further assessment. ____________________________________________   PROCEDURES  Procedure(s) performed: None Procedures Critical Care performed:  Yes  CRITICAL CARE Performed by: Rudene Re  ?  Total critical care time: 35 min  Critical care time was exclusive of separately billable procedures and treating other patients.  Critical care was necessary to treat or prevent imminent or life-threatening deterioration.  Critical care was time spent personally by me on the following activities: development of treatment plan with  patient and/or surrogate as well as nursing, discussions with consultants, evaluation of patient's response to treatment, examination of patient, obtaining history from patient or surrogate, ordering and performing treatments and interventions, ordering and review of laboratory studies, ordering and review of radiographic studies, pulse oximetry and re-evaluation of patient's condition.  ____________________________________________   INITIAL IMPRESSION / ASSESSMENT AND PLAN / ED COURSE  83 y.o. female with a history of dementia, CAD, atrial fibrillation not on anticoagulation, diastolic CHF, TIA, hypertension, hyperlipidemia, diabetes who presents for evaluation of unwitnessed fall and unknown down time. patient neurologically intact with no obvious trauma on exam. She does have mild pain with internal rotation of her right hip and an x-ray has been ordered. CT head and neck with no acute traumatic findings. Patient found to have coffee ground emesis dried around her mouth which was guaiac positive and also brown stool guaiac positive on exam concerning for GI bleed. She is not on any blood thinners. No prior h/o GIB. Hgb stable at 13.4. CK >8K consistent with rhabdo, patient receiving IV hydration. Will admit for GIB, possible syncope, and rhabdo.      As part of my medical decision making, I reviewed the following data within the [place] History obtained from family, Nursing notes reviewed and incorporated, Labs reviewed , EKG interpreted , Old EKG reviewed, Old chart reviewed, Radiograph reviewed , Discussed with admitting physician , Notes from prior ED visits and [place] Controlled Substance Database    Pertinent labs & imaging results that were available during my care of the patient were reviewed by me and considered in my medical decision making (see chart for details).    ____________________________________________   FINAL CLINICAL IMPRESSION(S) / ED DIAGNOSES  Final  diagnoses:  Traumatic rhabdomyolysis, initial encounter ([place])  Fall, initial encounter  Gastrointestinal hemorrhage, unspecified gastrointestinal hemorrhage type  NEW MEDICATIONS STARTED DURING THIS VISIT:  ED Discharge Orders    None       Note:  This document was prepared using Dragon voice recognition software and [place] include unintentional dictation errors.    Rudene Re, MD 08/12/17 2024

## 2017-08-12 NOTE — H&P (Signed)
[facility] NAME: Jill Howell    MR#:  [id number]  DATE OF BIRTH:  September 01, 1919  DATE OF ADMISSION:  08/12/2017  PRIMARY CARE PHYSICIAN: Tonia Ghent, MD   REQUESTING/REFERRING PHYSICIAN: Rudene Re, MD  CHIEF COMPLAINT:   Fall  HISTORY OF PRESENT ILLNESS:  Jill Howell  is a 83 y.o. female with a known history of dementia, lives alone, coronary artery disease on aspirin, chronic atrial fibrillation, hypertension, hyperlipidemia, diabetes is brought into the ED after she had unwitnessed fall today. Right hip x-ray revealed no fractures. According to the patient's family members bloody streaks were noticed around her mouth and in the ED stool for Hemoccult was positive. Hemoglobin is stable. CT of the head and a CT of the C-spine are negative. CK is elevated at 8000 and troponin is at 0.07. Hospitalist team is called to admit the patient. Patient was started on IV Protonix after 500 cc fluid bolus  PAST MEDICAL HISTORY:   Past Medical History:  Diagnosis Date  . Allergic rhinitis, cause unspecified   . Anemia   . Asthma   . Atrial fibrillation ([place])    a. remote->no anticoagulation 2/2 h/o falls.  [name] CAD (coronary artery disease)    a. 2000 s/p MI/CABG x 4 (VG-PDA, VG-OM, VG-Diag, LIMA-LAD);  b. 2011 MI;  c. 08/2013 NSTEMI/Cath: LM 90-95, LAD 90/100p, LCX 99p, 189m, RCA 100ost, VG-PDA nl, VG-OM 100 (culprit w/ thrombus), VG-Diag nl, LIMA-LAD nl, EF 55-60%, mild inf HK, mod MR-->Med Rx.  . Carotid arterial disease ([place])    a. 05/2013 s/p L CEA.  . Chronic diastolic CHF (congestive heart failure) ([place])    a. 08/2013 Echo: EF 50-55%, no rwma, Gr2 DD, mod MR, mildly dil LA, PASP 57mmHg.  [name] Esophageal reflux   . History of TIA (transient ischemic attack)   . Hyperlipidemia   . Hypertension   . Hypothyroidism   . Myocardial infarction ([place])    08-2013  . Type II or unspecified type diabetes mellitus without mention of  complication, not stated as uncontrolled   . Unspecified hearing loss     PAST SURGICAL HISTOIRY:   Past Surgical History:  Procedure Laterality Date  . ABDOMINAL HYSTERECTOMY    . Bladder tack     x 2  . CARDIAC CATHETERIZATION  09/07/1999   EF 25%. THERE IS SEVERE MITRAL ANNULAR CALIFICATION, WITH  3 + INSUFFICIENCY  . CARDIOVASCULAR STRESS TEST  06/30/2007   EF 63%, NO ISCHEMIA  . CAROTID ENDARTERECTOMY Left 05-19-13   cea  . CHOLECYSTECTOMY  10/2001  . CORONARY ARTERY BYPASS GRAFT  2000   4 vessel  . DILATION AND CURETTAGE OF UTERUS    . ENDARTERECTOMY Left 05/19/2013   Procedure: ENDARTERECTOMY CAROTID-LEFT;  Surgeon: Elam Dutch, MD;  Location: [facility];  Service: Vascular;  Laterality: Left;  . EYE SURGERY Bilateral    Cataract  . HEMORRHOID SURGERY    . LEFT HEART CATH  Dec. 2, 2014   With  Coronary Graft  Angiogram  . LEFT HEART CATHETERIZATION WITH CORONARY/GRAFT ANGIOGRAM  08/10/2013   Procedure: LEFT HEART CATHETERIZATION WITH Beatrix Fetters;  Surgeon: Peter M Martinique, MD;  Location: [name] CATH LAB;  Service: Cardiovascular;;  . PARTIAL HYSTERECTOMY    . RECTOCELE REPAIR     x 2  . ROTATOR CUFF REPAIR    . TONSILLECTOMY    . US ECHOCARDIOGRAPHY  01/17/2005   EF 55-60%  SOCIAL HISTORY:   Social History   Tobacco Use  . Smoking status: Never Smoker  . Smokeless tobacco: Never Used  Substance Use Topics  . Alcohol use: No    Alcohol/week: 0.0 oz    FAMILY HISTORY:   Family History  Problem Relation Age of Onset  . Heart attack Father        X's 3  . Heart disease Father   . Heart disease Son        before age 90  . Cancer Brother   . Hypertension Mother   . Alzheimer's disease Mother     DRUG ALLERGIES:   Allergies  Allergen Reactions  . Furosemide Other (See Comments)    Patient prefers not to take med, weakness and frequent urination  . Penicillins Other (See Comments)    unknown    REVIEW OF SYSTEMS:  Review of system  unobtainable as the patient is chronically demented  MEDICATIONS AT HOME:   Prior to Admission medications   Medication Sig Start Date End Date Taking? Authorizing Provider  aspirin 81 MG EC tablet Take 81 mg by mouth daily.      [provider]  atenolol (TENORMIN) 25 MG tablet Take 1 tablet (25 mg total) by mouth daily. 06/10/17   Minna Merritts, MD  BIOTIN PO Take 1 capsule by mouth daily.     [provider]  [place] 125 MCG tablet Take 1 tablet by mouth daily. 05/30/17   [provider]  isosorbide mononitrate (IMDUR) 30 MG 24 hr tablet TAKE ONE TABLET EVERY DAY 02/04/17   Minna Merritts, MD  levothyroxine (SYNTHROID, LEVOTHROID) 100 MCG tablet Take 1 tablet (100 mcg total) by mouth daily. 01/08/17   Tonia Ghent, MD  losartan (COZAAR) 25 MG tablet TAKE ONE TABLET EVERY DAY 02/04/17   Minna Merritts, MD  Multiple Vitamins-Minerals (PRESERVISION AREDS PO) Take 1 tablet by mouth daily.    [provider]  nitroGLYCERIN (NITROSTAT) 0.4 MG SL tablet PLACE 1 TABLET UNDER THE TONGUE EVERY 5 MINUTES AS NEEDED FOR CHEST PAIN. 03/01/15   Darlin Coco, MD  pantoprazole (PROTONIX) 40 MG tablet TAKE ONE TABLET EVERY DAY 04/09/17   Tonia Ghent, MD  QUEtiapine (SEROQUEL) 25 MG tablet TAKE ONE TABLET BY MOUTH EVERY EVENING AT BEDTIME 01/08/17   Tonia Ghent, MD  simvastatin (ZOCOR) 40 MG tablet TAKE ONE TABLET EVERY DAY 05/30/17   Tonia Ghent, MD  vitamin B-12 (CYANOCOBALAMIN) 1000 MCG tablet Take 1,000 mcg by mouth daily.     [provider]      VITAL SIGNS:  Blood pressure (!) 141/60, pulse 87, temperature (!) 97.5 F (36.4 C), temperature source Oral, resp. rate 20, height 5\' 4"  (1.626 m), weight 59 kg (130 lb), SpO2 96 %.  PHYSICAL EXAMINATION:  GENERAL:  83 y.o.-year-old patient lying in the bed with no acute distress.  EYES: Pupils equal, round, reactive to light and accommodation. No scleral icterus.  HEENT: Head atraumatic,  normocephalic. Oropharynx and nasopharynx clear.  NECK:  Supple, no jugular venous distention. No thyroid enlargement, no tenderness.  LUNGS: Normal breath sounds bilaterally, no wheezing, rales,rhonchi or crepitation. No use of accessory muscles of respiration.  CARDIOVASCULAR: S1, S2 normal. No murmurs, rubs, or gallops.  ABDOMEN: Soft, nontender, nondistended. Bowel sounds present.  EXTREMITIES: No pedal edema, cyanosis, or clubbing.  NEUROLOGIC: Disoriented from dementia PSYCHIATRIC: The patient is arousable but disoriented SKIN: No obvious rash, lesion, or ulcer.   LABORATORY  PANEL:   CBC Recent Labs  Lab 08/12/17 1741  WBC 18.7*  HGB 13.4  HCT 39.7  PLT 154   ------------------------------------------------------------------------------------------------------------------  Chemistries  Recent Labs  Lab 08/12/17 1741  NA 134*  K 3.3*  CL 98*  CO2 22  GLUCOSE 120*  BUN 31*  CREATININE 1.26*  CALCIUM 8.7*   ------------------------------------------------------------------------------------------------------------------  Cardiac Enzymes Recent Labs  Lab 08/12/17 1741  TROPONINI 0.07*   ------------------------------------------------------------------------------------------------------------------  RADIOLOGY:  Ct Head Wo Contrast  Result Date: 08/12/2017 CLINICAL DATA:  Found on floor by son femoral fall, history dementia, atrial fibrillation, asthma, coronary artery disease post MI and CABG, CHF, type II diabetes mellitus EXAM: CT HEAD WITHOUT CONTRAST CT CERVICAL SPINE WITHOUT CONTRAST TECHNIQUE: Multidetector CT imaging of the head and cervical spine was performed following the standard protocol without intravenous contrast. Multiplanar CT image reconstructions of the cervical spine were also generated. COMPARISON:  CT head 04/22/2016 FINDINGS: CT HEAD FINDINGS Brain: Generalized atrophy. Normal ventricular morphology. No midline shift or mass effect. Small  vessel chronic ischemic changes of deep cerebral white matter. Small old lacunar infarct at LEFT caudate head. Tiny BILATERAL old deep cerebellar infarcts. No intracranial hemorrhage, mass lesion, evidence of acute infarction, or extra-axial fluid collection. Vascular: Atherosclerotic calcification of internal carotid and vertebral arteries at skullbase. No hyperdense vessels. Skull: Intact Sinuses/Orbits: Clear Other: N/A CT CERVICAL SPINE FINDINGS Alignment: Normal Skull base and vertebrae: Bones demineralized. Vertebral body heights maintained. No fracture, subluxation or bone destruction. Multilevel facet degenerative changes. Soft tissues and spinal canal: Prevertebral soft tissues normal thickness. Regional cervical soft tissues otherwise unremarkable. Disc levels:  Grossly unremarkable. Upper chest: Tiny nodular foci at both lung apices up to 4 mm diameter, noncalcified. No infiltrate or pleural effusion. Other: Atherosclerotic calcification at the carotid bifurcations bilaterally. IMPRESSION: Atrophy with small vessel chronic ischemic changes of deep cerebral white matter. Old lacunar infarcts within cerebellum and at LEFT caudate head. No acute intracranial abnormalities. No acute cervical spine abnormalities. Tiny nodular foci at lung apices up to 4 mm diameter; patient has chronic interstitial prominence on prior chest radiographs without discrete mass/nodule. Significance of current findings is uncertain and if clinically indicated follow-up CT chest could be performed for further assessment. Electronically Signed   By: Lavonia Dana M.D.   On: 08/12/2017 17:41   Ct Cervical Spine Wo Contrast  Result Date: 08/12/2017 CLINICAL DATA:  Found on floor by son femoral fall, history dementia, atrial fibrillation, asthma, coronary artery disease post MI and CABG, CHF, type II diabetes mellitus EXAM: CT HEAD WITHOUT CONTRAST CT CERVICAL SPINE WITHOUT CONTRAST TECHNIQUE: Multidetector CT imaging of the head and  cervical spine was performed following the standard protocol without intravenous contrast. Multiplanar CT image reconstructions of the cervical spine were also generated. COMPARISON:  CT head 04/22/2016 FINDINGS: CT HEAD FINDINGS Brain: Generalized atrophy. Normal ventricular morphology. No midline shift or mass effect. Small vessel chronic ischemic changes of deep cerebral white matter. Small old lacunar infarct at LEFT caudate head. Tiny BILATERAL old deep cerebellar infarcts. No intracranial hemorrhage, mass lesion, evidence of acute infarction, or extra-axial fluid collection. Vascular: Atherosclerotic calcification of internal carotid and vertebral arteries at skullbase. No hyperdense vessels. Skull: Intact Sinuses/Orbits: Clear Other: N/A CT CERVICAL SPINE FINDINGS Alignment: Normal Skull base and vertebrae: Bones demineralized. Vertebral body heights maintained. No fracture, subluxation or bone destruction. Multilevel facet degenerative changes. Soft tissues and spinal canal: Prevertebral soft tissues normal thickness. Regional cervical soft tissues otherwise unremarkable. Disc levels:  Grossly unremarkable.  Upper chest: Tiny nodular foci at both lung apices up to 4 mm diameter, noncalcified. No infiltrate or pleural effusion. Other: Atherosclerotic calcification at the carotid bifurcations bilaterally. IMPRESSION: Atrophy with small vessel chronic ischemic changes of deep cerebral white matter. Old lacunar infarcts within cerebellum and at LEFT caudate head. No acute intracranial abnormalities. No acute cervical spine abnormalities. Tiny nodular foci at lung apices up to 4 mm diameter; patient has chronic interstitial prominence on prior chest radiographs without discrete mass/nodule. Significance of current findings is uncertain and if clinically indicated follow-up CT chest could be performed for further assessment. Electronically Signed   By: Lavonia Dana M.D.   On: 08/12/2017 17:41   Dg Hip Unilat W Or  Wo Pelvis 2-3 Views Right  Result Date: 08/12/2017 CLINICAL DATA:  Recent fall with hip pain, initial encounter EXAM: DG HIP (WITH OR WITHOUT PELVIS) 2-3V RIGHT COMPARISON:  None. FINDINGS: The pelvic ring is intact. Degenerative changes of the hip joints and lumbar spine are noted. No definitive acute fracture or dislocation is noted. No soft tissue changes are seen. IMPRESSION: Degenerative change without acute abnormality. Electronically Signed   By: Inez Catalina M.D.   On: 08/12/2017 20:20    EKG:   Orders placed or performed during the hospital encounter of 08/12/17  . ED EKG  . ED EKG    IMPRESSION AND PLAN:   Jill Howell  is a 83 y.o. female with a known history of dementia, lives alone, coronary artery disease on aspirin, chronic atrial fibrillation, hypertension, hyperlipidemia, diabetes is brought into the ED after she had unwitnessed fall today. Right hip x-ray revealed no fractures. According to the patient's family members bloody streaks were noticed around her mouth and in the ED stool for Hemoccult was positive. Hemoglobin is stable. CT of the head and a CT of the C-spine are negative. CK is elevated at 8000 and troponin is at 0.07  # Rhabdomyolysis following a fall Admitted to MedSurg unit Hydrated with IV fluids Check CK in a.m. Patient's echocardiogram in year 2017 has revealed 50-55% ejection fraction  #GI bleed Possible hematemesis and stool for occult blood is positive according to the ED physician Nothing by mouth after midnight Protonix bolus and drip Monitor hemoglobin and hematocrit, transfuse as needed Patient is hemodynamically stable GI consult Hold her home medication aspirin  #Hypokalemia Replete and recheck in a.m.  #Elevated troponin in the setting of rhabdomyolysis Monitor patient on telemetry and cycle troponins  #Diabetes with a sliding scale insulin  All the records are reviewed and case discussed with ED provider. Management plans discussed  with the patient, family and they are in agreement.  CODE STATUS: fc   TOTAL TIME TAKING CARE OF THIS PATIENT: 45  minutes.   Note: This dictation was prepared with Dragon dictation along with smaller phrase technology. Any transcriptional errors that result from this process are unintentional.  Nicholes Mango M.D on 08/12/2017 at 8:29 PM  Between 7am to 6pm - Pager - [phone]  After 6pm go to [email or web address] - password [email or web address] [facility]  Office  [phone]  CC: Primary care physician; Tonia Ghent, MD

## 2017-08-13 DIAGNOSIS — T796XXS Traumatic ischemia of muscle, sequela: Secondary | ICD-10-CM

## 2017-08-13 DIAGNOSIS — K1379 Other lesions of oral mucosa: Secondary | ICD-10-CM

## 2017-08-13 LAB — CBC
HCT: 33.1 % — ABNORMAL LOW (ref 35.0–47.0)
Hemoglobin: 11.3 g/dL — ABNORMAL LOW (ref 12.0–16.0)
MCH: 29.9 pg (ref 26.0–34.0)
MCHC: 34.3 g/dL (ref 32.0–36.0)
MCV: 87.4 fL (ref 80.0–100.0)
Platelets: 147 10*3/uL — ABNORMAL LOW (ref 150–440)
RBC: 3.79 MIL/uL — ABNORMAL LOW (ref 3.80–5.20)
RDW: 14.2 % (ref 11.5–14.5)
WBC: 15.6 10*3/uL — ABNORMAL HIGH (ref 3.6–11.0)

## 2017-08-13 LAB — GLUCOSE, CAPILLARY
GLUCOSE-CAPILLARY: 87 mg/dL (ref 65–99)
GLUCOSE-CAPILLARY: 79 mg/dL (ref 65–99)
GLUCOSE-CAPILLARY: 84 mg/dL (ref 65–99)
Glucose-Capillary: 87 mg/dL (ref 65–99)
Glucose-Capillary: 80 mg/dL (ref 65–99)

## 2017-08-13 LAB — TROPONIN I
Troponin I: 0.06 ng/mL (ref <0.03)
Troponin I: 0.06 ng/mL (ref <0.03)

## 2017-08-13 LAB — COMPREHENSIVE METABOLIC PANEL
ALBUMIN: 3.2 g/dL — AB (ref 3.5–5.0)
ALK PHOS: 53 U/L (ref 38–126)
ALT: 35 U/L (ref 14–54)
ANION GAP: 10 (ref 5–15)
AST: 136 U/L — AB (ref 15–41)
BILIRUBIN TOTAL: 1.2 mg/dL (ref 0.3–1.2)
BUN: 29 mg/dL — AB (ref 6–20)
CALCIUM: 8.1 mg/dL — AB (ref 8.9–10.3)
CO2: 21 mmol/L — ABNORMAL LOW (ref 22–32)
Chloride: 103 mmol/L (ref 101–111)
Creatinine, Ser: 1.25 mg/dL — ABNORMAL HIGH (ref 0.44–1.00)
GFR calc Af Amer: 41 mL/min — ABNORMAL LOW (ref >60)
GFR, EST NON AFRICAN AMERICAN: 35 mL/min — AB (ref >60)
GLUCOSE: 111 mg/dL — AB (ref 65–99)
Potassium: 3.5 mmol/L (ref 3.5–5.1)
Sodium: 134 mmol/L — ABNORMAL LOW (ref 135–145)
TOTAL PROTEIN: 5.8 g/dL — AB (ref 6.5–8.1)

## 2017-08-13 LAB — CK: Total CK: 5016 U/L — ABNORMAL HIGH (ref 38–234)

## 2017-08-13 MED ORDER — SODIUM CHLORIDE 0.9 % IV SOLN
INTRAVENOUS | Status: AC
  Administered 2017-08-14 (×2): via INTRAVENOUS

## 2017-08-13 NOTE — Consult Note (Signed)
Vonda Antigua, MD [address] [place], [place], [place], [id number] [place], [place], [place], [address] Phone: [phone]  Fax: [phone]  Consultation  Referring Provider:     Dr. Benjie Karvonen Primary Care Physician:  Tonia Ghent, MD Primary Gastroenterologist:  Virgel Manifold, MD        Reason for Consultation:     GI Bleed  Date of Admission:  08/12/2017 Date of Consultation:  08/13/2017         HPI:   Jill Howell is a 83 y.o. female presented after a fall at home and admitted with rhabdomyolysis and GI consulted for blood streaks noted around her mouth.  Patient has a history of dementia itself.  Her son went home to check on her, and found her on the floor.  Reportedly blood streaks were noted around her mouth, and stool was positive for Hemoccult in the ED.  I talked to the patient's son who found her on the floor, he denies any red blood or black stool anywhere in the house or around the patient when she was found.  When I asked the patient why she is here, she states it is because she fell.  She denies any abdominal pain or nausea.  Denies any emesis at home.  She is able to tell me the year and month and her name and correctly.  She is not able to tell me the day or who the president is.  The family reports that she has had a colonoscopy years ago they do not know the results of this.  They do not know of any previous EGDs.  CT of the head and CT C-spine were negative on presentation.  Her CK was noted to be elevated at 8000.  Past Medical History:  Diagnosis Date  . Allergic rhinitis, cause unspecified   . Anemia   . Asthma   . Atrial fibrillation ([place])    a. remote->no anticoagulation 2/2 h/o falls.  [name] CAD (coronary artery disease)    a. 2000 s/p MI/CABG x 4 (VG-PDA, VG-OM, VG-Diag, LIMA-LAD);  b. 2011 MI;  c. 08/2013 NSTEMI/Cath: LM 90-95, LAD 90/100p, LCX 99p, 12m, RCA 100ost, VG-PDA nl, VG-OM 100 (culprit w/ thrombus), VG-Diag nl, LIMA-LAD  nl, EF 55-60%, mild inf HK, mod MR-->Med Rx.  . Carotid arterial disease ([place])    a. 05/2013 s/p L CEA.  . Chronic diastolic CHF (congestive heart failure) ([place])    a. 08/2013 Echo: EF 50-55%, no rwma, Gr2 DD, mod MR, mildly dil LA, PASP 58mmHg.  [name] Esophageal reflux   . History of TIA (transient ischemic attack)   . Hyperlipidemia   . Hypertension   . Hypothyroidism   . Myocardial infarction ([place])    08-2013  . Type II or unspecified type diabetes mellitus without mention of complication, not stated as uncontrolled   . Unspecified hearing loss     Past Surgical History:  Procedure Laterality Date  . ABDOMINAL HYSTERECTOMY    . Bladder tack     x 2  . CARDIAC CATHETERIZATION  09/07/1999   EF 25%. THERE IS SEVERE MITRAL ANNULAR CALIFICATION, WITH  3 + INSUFFICIENCY  . CARDIOVASCULAR STRESS TEST  06/30/2007   EF 63%, NO ISCHEMIA  . CAROTID ENDARTERECTOMY Left 05-19-13   cea  . CHOLECYSTECTOMY  10/2001  . CORONARY ARTERY BYPASS GRAFT  2000   4 vessel  . DILATION AND CURETTAGE OF UTERUS    . ENDARTERECTOMY Left 05/19/2013  Procedure: ENDARTERECTOMY CAROTID-LEFT;  Surgeon: Elam Dutch, MD;  Location: [facility];  Service: Vascular;  Laterality: Left;  . EYE SURGERY Bilateral    Cataract  . HEMORRHOID SURGERY    . LEFT HEART CATH  Dec. 2, 2014   With  Coronary Graft  Angiogram  . LEFT HEART CATHETERIZATION WITH CORONARY/GRAFT ANGIOGRAM  08/10/2013   Procedure: LEFT HEART CATHETERIZATION WITH Beatrix Fetters;  Surgeon: Peter M Martinique, MD;  Location[facility];  Service: Cardiovascular;;  . PARTIAL HYSTERECTOMY    . RECTOCELE REPAIR     x 2  . ROTATOR CUFF REPAIR    . TONSILLECTOMY    . US ECHOCARDIOGRAPHY  01/17/2005   EF 55-60%    Prior to Admission medications   Medication Sig Start Date End Date Taking? Authorizing Provider  aspirin 81 MG EC tablet Take 81 mg by mouth daily.      [provider]  atenolol (TENORMIN) 25 MG tablet Take 1 tablet (25 mg  total) by mouth daily. 06/10/17   Minna Merritts, MD  BIOTIN PO Take 1 capsule by mouth daily.     [provider]  [place] 125 MCG tablet Take 1 tablet by mouth daily. 05/30/17   [provider]  isosorbide mononitrate (IMDUR) 30 MG 24 hr tablet TAKE ONE TABLET EVERY DAY 02/04/17   Minna Merritts, MD  levothyroxine (SYNTHROID, LEVOTHROID) 100 MCG tablet Take 1 tablet (100 mcg total) by mouth daily. 01/08/17   Tonia Ghent, MD  losartan (COZAAR) 25 MG tablet TAKE ONE TABLET EVERY DAY 02/04/17   Minna Merritts, MD  Multiple Vitamins-Minerals (PRESERVISION AREDS PO) Take 1 tablet by mouth daily.    [provider]  nitroGLYCERIN (NITROSTAT) 0.4 MG SL tablet PLACE 1 TABLET UNDER THE TONGUE EVERY 5 MINUTES AS NEEDED FOR CHEST PAIN. 03/01/15   Darlin Coco, MD  pantoprazole (PROTONIX) 40 MG tablet TAKE ONE TABLET EVERY DAY 04/09/17   Tonia Ghent, MD  QUEtiapine (SEROQUEL) 25 MG tablet TAKE ONE TABLET BY MOUTH EVERY EVENING AT BEDTIME 01/08/17   Tonia Ghent, MD  simvastatin (ZOCOR) 40 MG tablet TAKE ONE TABLET EVERY DAY 05/30/17   Tonia Ghent, MD  vitamin B-12 (CYANOCOBALAMIN) 1000 MCG tablet Take 1,000 mcg by mouth daily.     [provider]    Family History  Problem Relation Age of Onset  . Heart attack Father        X's 3  . Heart disease Father   . Heart disease Son        before age 91  . Cancer Brother   . Hypertension Mother   . Alzheimer's disease Mother      Social History   Tobacco Use  . Smoking status: Never Smoker  . Smokeless tobacco: Never Used  Substance Use Topics  . Alcohol use: No    Alcohol/week: 0.0 oz  . Drug use: No    Allergies as of 08/12/2017 - Review Complete 08/12/2017  Allergen Reaction Noted  . Furosemide Other (See Comments) 02/19/2016  . Penicillins Other (See Comments) 09/15/2007    Review of Systems:    All systems reviewed and negative except where noted in HPI.   Physical Exam:  Vital  signs in last 24 hours: Vitals:   08/12/17 2100 08/12/17 2131 08/13/17 0459 08/13/17 1314  BP: (!) 137/57 (!) 155/47 (!) 126/47 (!) 113/46  Pulse: 87 93 76 66  Resp: 18 (!) 21 17 20  Temp:  97.7 F (36.5 C) 98.4 F (36.9 C) 98.1 F (36.7 C)  TempSrc:  Oral Oral Oral  SpO2: 96% 98% 99% 97%  Weight:      Height:       Last BM Date: 08/12/17 General:   Pleasant, cooperative in NAD Head:  Normocephalic and atraumatic. Eyes:   No icterus.   Conjunctiva pink. PERRLA. Ears:  Normal auditory acuity. Neck:  Supple; no masses or thyroidomegaly Lungs: Respirations even and unlabored. Lungs clear to auscultation bilaterally.   No wheezes, crackles, or rhonchi.  Heart:  Regular rate and rhythm;  Without murmur, clicks, rubs or gallops Abdomen:  Soft, nondistended, nontender. Normal bowel sounds. No appreciable masses or hepatomegaly.  No rebound or guarding.  Neurologic:  Alert and oriented x3;  grossly normal neurologically. Skin:  Intact without significant lesions or rashes. Cervical Nodes:  No significant cervical adenopathy. Psych:  Alert and cooperative. Normal affect.  LAB RESULTS: Recent Labs    08/12/17 1741 08/13/17 0132  WBC 18.7* 15.6*  HGB 13.4 11.3*  HCT 39.7 33.1*  PLT 154 147*   BMET Recent Labs    08/12/17 1741 08/13/17 0132  NA 134* 134*  K 3.3* 3.5  CL 98* 103  CO2 22 21*  GLUCOSE 120* 111*  BUN 31* 29*  CREATININE 1.26* 1.25*  CALCIUM 8.7* 8.1*   LFT Recent Labs    08/13/17 0132  PROT 5.8*  ALBUMIN 3.2*  AST 136*  ALT 35  ALKPHOS 53  BILITOT 1.2   PT/INR No results for input(s): LABPROT, INR in the last 72 hours.  STUDIES: Ct Head Wo Contrast  Result Date: 08/12/2017 CLINICAL DATA:  Found on floor by son femoral fall, history dementia, atrial fibrillation, asthma, coronary artery disease post MI and CABG, CHF, type II diabetes mellitus EXAM: CT HEAD WITHOUT CONTRAST CT CERVICAL SPINE WITHOUT CONTRAST TECHNIQUE: Multidetector CT imaging of  the head and cervical spine was performed following the standard protocol without intravenous contrast. Multiplanar CT image reconstructions of the cervical spine were also generated. COMPARISON:  CT head 04/22/2016 FINDINGS: CT HEAD FINDINGS Brain: Generalized atrophy. Normal ventricular morphology. No midline shift or mass effect. Small vessel chronic ischemic changes of deep cerebral white matter. Small old lacunar infarct at LEFT caudate head. Tiny BILATERAL old deep cerebellar infarcts. No intracranial hemorrhage, mass lesion, evidence of acute infarction, or extra-axial fluid collection. Vascular: Atherosclerotic calcification of internal carotid and vertebral arteries at skullbase. No hyperdense vessels. Skull: Intact Sinuses/Orbits: Clear Other: N/A CT CERVICAL SPINE FINDINGS Alignment: Normal Skull base and vertebrae: Bones demineralized. Vertebral body heights maintained. No fracture, subluxation or bone destruction. Multilevel facet degenerative changes. Soft tissues and spinal canal: Prevertebral soft tissues normal thickness. Regional cervical soft tissues otherwise unremarkable. Disc levels:  Grossly unremarkable. Upper chest: Tiny nodular foci at both lung apices up to 4 mm diameter, noncalcified. No infiltrate or pleural effusion. Other: Atherosclerotic calcification at the carotid bifurcations bilaterally. IMPRESSION: Atrophy with small vessel chronic ischemic changes of deep cerebral white matter. Old lacunar infarcts within cerebellum and at LEFT caudate head. No acute intracranial abnormalities. No acute cervical spine abnormalities. Tiny nodular foci at lung apices up to 4 mm diameter; patient has chronic interstitial prominence on prior chest radiographs without discrete mass/nodule. Significance of current findings is uncertain and if clinically indicated follow-up CT chest could be performed for further assessment. Electronically Signed   By: Lavonia Dana M.D.   On: 08/12/2017 17:41   Ct  Cervical Spine Wo  Contrast  Result Date: 08/12/2017 CLINICAL DATA:  Found on floor by son femoral fall, history dementia, atrial fibrillation, asthma, coronary artery disease post MI and CABG, CHF, type II diabetes mellitus EXAM: CT HEAD WITHOUT CONTRAST CT CERVICAL SPINE WITHOUT CONTRAST TECHNIQUE: Multidetector CT imaging of the head and cervical spine was performed following the standard protocol without intravenous contrast. Multiplanar CT image reconstructions of the cervical spine were also generated. COMPARISON:  CT head 04/22/2016 FINDINGS: CT HEAD FINDINGS Brain: Generalized atrophy. Normal ventricular morphology. No midline shift or mass effect. Small vessel chronic ischemic changes of deep cerebral white matter. Small old lacunar infarct at LEFT caudate head. Tiny BILATERAL old deep cerebellar infarcts. No intracranial hemorrhage, mass lesion, evidence of acute infarction, or extra-axial fluid collection. Vascular: Atherosclerotic calcification of internal carotid and vertebral arteries at skullbase. No hyperdense vessels. Skull: Intact Sinuses/Orbits: Clear Other: N/A CT CERVICAL SPINE FINDINGS Alignment: Normal Skull base and vertebrae: Bones demineralized. Vertebral body heights maintained. No fracture, subluxation or bone destruction. Multilevel facet degenerative changes. Soft tissues and spinal canal: Prevertebral soft tissues normal thickness. Regional cervical soft tissues otherwise unremarkable. Disc levels:  Grossly unremarkable. Upper chest: Tiny nodular foci at both lung apices up to 4 mm diameter, noncalcified. No infiltrate or pleural effusion. Other: Atherosclerotic calcification at the carotid bifurcations bilaterally. IMPRESSION: Atrophy with small vessel chronic ischemic changes of deep cerebral white matter. Old lacunar infarcts within cerebellum and at LEFT caudate head. No acute intracranial abnormalities. No acute cervical spine abnormalities. Tiny nodular foci at lung apices up to  4 mm diameter; patient has chronic interstitial prominence on prior chest radiographs without discrete mass/nodule. Significance of current findings is uncertain and if clinically indicated follow-up CT chest could be performed for further assessment. Electronically Signed   By: Lavonia Dana M.D.   On: 08/12/2017 17:41   Dg Hip Unilat W Or Wo Pelvis 2-3 Views Right  Result Date: 08/12/2017 CLINICAL DATA:  Recent fall with hip pain, initial encounter EXAM: DG HIP (WITH OR WITHOUT PELVIS) 2-3V RIGHT COMPARISON:  None. FINDINGS: The pelvic ring is intact. Degenerative changes of the hip joints and lumbar spine are noted. No definitive acute fracture or dislocation is noted. No soft tissue changes are seen. IMPRESSION: Degenerative change without acute abnormality. Electronically Signed   By: Inez Catalina M.D.   On: 08/12/2017 20:20      Impression / Plan:   Jill Howell is a 83 y.o. y/o female with rhabdomyolysis, fall at home, dementia, GI consulted for blood streaks noted around her mouth at home  Patient has not had any bowel movement since presentation There are no signs of GI bleeding at this point patient is hemodynamically stable. blood streaks noted around her mouth could have been due to the fall, and could have been blood from her lips, or her mouth. It is unclear how long she was on the floor and with a CK of 8000, I suspect that she was on the floor for a while.  With no active signs of GI bleeding, no indication for endoscopy at this time Hemoglobin is stable and patient has not required a transfusion since admission.  Please make n.p.o. if patient develops signs of active GI bleeding, and we can evaluate for EGD at that time.  Would recommend serial CBCs and transfuse as needed Would recommend Protonix 40 IV twice daily for now.  If no further active GI bleeding and hemoglobin stays stable, this can be changed to p.o.   I  have discussed everything with the patient's family in detail.   They do not want invasive procedures done if there is no good indication, and bleeding has resolved.  We also discussed indications for screening colonoscopies given that she has not had one in a while, and recommendations that screening should be stopped after 80 years of age.  Patient's family does not want a screening colonoscopy now or in the future as well.  Treatment for rhabdomyolysis as per primary team  If patient develops signs of active GI bleeding, please page GI and consult ICU for transfer.  Thank you for involving me in the care of this patient.      LOS: 1 day   Virgel Manifold, MD  08/13/2017, 3:42 PM

## 2017-08-13 NOTE — Evaluation (Addendum)
Physical Therapy Evaluation Patient Details Name: Jill Howell MRN: [id number] DOB: 1919-07-07 Today's Date: 08/13/2017   History of Present Illness  Pt admitted for rhabdomyolysis.  PMH includes dementia, CAD, atrial fibrillation, Htn, hyperlipidemia, DM, allergic rhinitis, anemia, CHF and TIA.  Clinical Impression  Pt is a 83 year old female who lives in a house alone.  She is able to perform bed mobility with use of bed rail and requires min assist to initiate STS with RW.  Pt was able to ambulate in room reporting mild fatigue and LE soreness following.  She presented with overall decreased strength in UE and LE.  Pt demonstrated mild balance deficits and some decreased safety awareness when using RW.  PT educated pt concerning safety with ambulation and use of AD.  Pt repeatedly reported fatigue and concern about falling in the hospital and following DC.  Pt will continue to benefit from skilled PT with focus on strength, balance, safety awareness and tolerance to activity.    Follow Up Recommendations SNF    Equipment Recommendations       Recommendations for Other Services       Precautions / Restrictions Precautions Precautions: Fall Restrictions Weight Bearing Restrictions: No      Mobility  Bed Mobility Overal bed mobility: Needs Assistance Bed Mobility: Rolling;Sidelying to Sit;Sit to Supine Rolling: Mod assist Sidelying to sit: Mod assist;HOB elevated   Sit to supine: Max assist;HOB elevated   General bed mobility comments: Pt required hand held assistance to perform supine to sit and max assist for sit to supine when bringing LE's over EOB.  Pt repeatedly experienced LOB's in the posterior direction.  Transfers Overall transfer level: Needs assistance Equipment used: Rolling walker (2 wheeled) Transfers: Sit to/from Stand Sit to Stand: Max assist Stand pivot transfers: Max assist       General transfer comment: Three attempted stand pivot transfers with pt's  daughter demonstrating how she normally transfers pt at home.  Pt experienced LOB's each time and appeared too weak to complete transfer.  Pt's daughter states that this is not baseline for the pt.  Ambulation/Gait     Assistive device: (Unable to perform.)          Stairs            Wheelchair Mobility    Modified Rankin (Stroke Patients Only)       Balance Overall balance assessment: Needs assistance Sitting-balance support: Bilateral upper extremity supported;Feet supported   Sitting balance - Comments: Requires support from PT to avoid posterior LOB. Postural control: Posterior lean Standing balance support: Bilateral upper extremity supported   Standing balance comment: Unable to remain upright when standing with RW.                             Pertinent Vitals/Pain Pain Assessment: Faces Faces Pain Scale: Hurts little more Pain Location: Pt reports that the only pain she feels is a headache. Pain Intervention(s): Monitored during session    Home Living Family/patient expects to be discharged to:: Private residence Living Arrangements: Children Available Help at Discharge: Family Type of Home: House Home Access: [place]: [address] [place]: Environmental consultant - 2 wheels;Wheelchair - manual      Prior Function                 Hand Dominance        Extremity/Trunk Assessment   Upper Extremity  Assessment Upper Extremity Assessment: Generalized weakness    Lower Extremity Assessment Lower Extremity Assessment: Generalized weakness    Cervical / Trunk Assessment Cervical / Trunk Assessment: Kyphotic  Communication   Communication: Receptive difficulties  Cognition Arousal/Alertness: Lethargic Behavior During Therapy: Restless Overall Cognitive Status: Impaired/Different from baseline Area of Impairment: Attention                   Current Attention Level: Selective           General  Comments: Pt vascillated between being able to receive commands and then appearing unaware that the PT had spoken to her.  Pt's daughter stated that the pt's overall performance and appearance is not close to baseline.      General Comments      Exercises     Assessment/Plan    PT Assessment Patient needs continued PT services  PT Problem List Decreased strength;Decreased range of motion;Decreased activity tolerance;Decreased balance;Decreased mobility;Decreased cognition;Decreased knowledge of use of DME       PT Treatment Interventions DME instruction;Gait training;Functional mobility training;Therapeutic activities;Therapeutic exercise;Balance training;Patient/family education    PT Goals (Current goals can be found in the Care Plan section)       Frequency Min 2X/week   Barriers to discharge        Co-evaluation               AM-PAC PT "6 Clicks" Daily Activity  Outcome Measure Difficulty turning over in bed (including adjusting bedclothes, sheets and blankets)?: A Lot Difficulty moving from lying on back to sitting on the side of the bed? : A Lot Difficulty sitting down on and standing up from a chair with arms (e.g., wheelchair, bedside commode, etc,.)?: Unable Help needed moving to and from a bed to chair (including a wheelchair)?: Total Help needed walking in hospital room?: Total Help needed climbing 3-5 steps with a railing? : Total 6 Click Score: 8    End of Session Equipment Utilized During Treatment: Gait belt Activity Tolerance: Patient limited by fatigue Patient left: in bed;with call bell/phone within reach;with bed alarm set;with family/visitor present Nurse Communication: Mobility status;Patient requests pain meds(Pt requests change of colostomy bag.) PT Visit Diagnosis: Unsteadiness on feet (R26.81);Repeated falls (R29.6);Muscle weakness (generalized) (M62.81);Difficulty in walking, not elsewhere classified (R26.2)    Time: 0263-7858 PT Time  Calculation (min) (ACUTE ONLY): 37 min   Charges:   PT Evaluation $PT Eval Moderate Complexity: 1 Mod PT Treatments $Therapeutic Activity: 8-22 mins   PT G Codes:   PT G-Codes **NOT FOR INPATIENT CLASS** Functional Assessment Tool Used: AM-PAC 6 Clicks Basic Mobility Functional Limitation: Changing and maintaining body position Changing and Maintaining Body Position Current Status (I5027): At least 80 percent but less than 100 percent impaired, limited or restricted Changing and Maintaining Body Position Goal Status (X4128): At least 1 percent but less than 20 percent impaired, limited or restricted    Roxanne Gates, PT, DPT  Roxanne Gates 08/13/2017, 5:56 PM   Addendum: Treatment frequency updated/corrected to reflect correct treatment frequency based on diagnosis per rehab guidelines. Brent Noto H. Owens Shark, PT, DPT, NCS 08/13/17, 7:29 PM [phone]

## 2017-08-13 NOTE — Progress Notes (Signed)
[facility] [place] NAME: Jill Howell    MR#:  [id number]  DATE OF BIRTH:  1919/01/01  SUBJECTIVE:   No acute events overnight  REVIEW OF SYSTEMS:   Patient with dementia unable to obtain a thorough review of systems    Tolerating Diet: yes      DRUG ALLERGIES:   Allergies  Allergen Reactions  . Furosemide Other (See Comments)    Patient prefers not to take med, weakness and frequent urination  . Penicillins Other (See Comments)    unknown    VITALS:  Blood pressure (!) 126/47, pulse 76, temperature 98.4 F (36.9 C), temperature source Oral, resp. rate 17, height 5\' 4"  (1.626 m), weight 59 kg (130 lb), SpO2 99 %.  PHYSICAL EXAMINATION:  Constitutional: Appears well-developed and well-nourished. No distress. HENT: Normocephalic. [name] Oropharynx is clear and moist.  Eyes: Conjunctivae and EOM are normal. PERRLA, no scleral icterus.  Neck: Normal ROM. Neck supple. No JVD. No tracheal deviation. CVS: RRR, S1/S2 +, no murmurs, no gallops, no carotid bruit.  Pulmonary: Effort and breath sounds normal, no stridor, rhonchi, wheezes, rales.  Abdominal: Soft. BS +,  no distension, tenderness, rebound or guarding.  Musculoskeletal: Normal range of motion. No edema and no tenderness.  Neuro: Alert. CN 2-12 grossly intact. No focal deficits. Oriented to name Skin: Skin is warm and dry. No rash noted. Psychiatric: Dementia     LABORATORY PANEL:   CBC Recent Labs  Lab 08/13/17 0132  WBC 15.6*  HGB 11.3*  HCT 33.1*  PLT 147*   ------------------------------------------------------------------------------------------------------------------  Chemistries  Recent Labs  Lab 08/13/17 0132  NA 134*  K 3.5  CL 103  CO2 21*  GLUCOSE 111*  BUN 29*  CREATININE 1.25*  CALCIUM 8.1*  AST 136*  ALT 35  ALKPHOS 53  BILITOT 1.2    ------------------------------------------------------------------------------------------------------------------  Cardiac Enzymes Recent Labs  Lab 08/12/17 2032 08/13/17 0132 08/13/17 0730  TROPONINI 0.06* 0.06* 0.06*   ------------------------------------------------------------------------------------------------------------------  RADIOLOGY:  Ct Head Wo Contrast  Result Date: 08/12/2017 CLINICAL DATA:  Found on floor by son femoral fall, history dementia, atrial fibrillation, asthma, coronary artery disease post MI and CABG, CHF, type II diabetes mellitus EXAM: CT HEAD WITHOUT CONTRAST CT CERVICAL SPINE WITHOUT CONTRAST TECHNIQUE: Multidetector CT imaging of the head and cervical spine was performed following the standard protocol without intravenous contrast. Multiplanar CT image reconstructions of the cervical spine were also generated. COMPARISON:  CT head 04/22/2016 FINDINGS: CT HEAD FINDINGS Brain: Generalized atrophy. Normal ventricular morphology. No midline shift or mass effect. Small vessel chronic ischemic changes of deep cerebral white matter. Small old lacunar infarct at LEFT caudate head. Tiny BILATERAL old deep cerebellar infarcts. No intracranial hemorrhage, mass lesion, evidence of acute infarction, or extra-axial fluid collection. Vascular: Atherosclerotic calcification of internal carotid and vertebral arteries at skullbase. No hyperdense vessels. Skull: Intact Sinuses/Orbits: Clear Other: N/A CT CERVICAL SPINE FINDINGS Alignment: Normal Skull base and vertebrae: Bones demineralized. Vertebral body heights maintained. No fracture, subluxation or bone destruction. Multilevel facet degenerative changes. Soft tissues and spinal canal: Prevertebral soft tissues normal thickness. Regional cervical soft tissues otherwise unremarkable. Disc levels:  Grossly unremarkable. Upper chest: Tiny nodular foci at both lung apices up to 4 mm diameter, noncalcified. No infiltrate or pleural  effusion. Other: Atherosclerotic calcification at the carotid bifurcations bilaterally. IMPRESSION: Atrophy with small vessel chronic ischemic changes of deep cerebral white matter. Old lacunar infarcts within cerebellum and at LEFT caudate head.  No acute intracranial abnormalities. No acute cervical spine abnormalities. Tiny nodular foci at lung apices up to 4 mm diameter; patient has chronic interstitial prominence on prior chest radiographs without discrete mass/nodule. Significance of current findings is uncertain and if clinically indicated follow-up CT chest could be performed for further assessment. Electronically Signed   By: Lavonia Dana M.D.   On: 08/12/2017 17:41   Ct Cervical Spine Wo Contrast  Result Date: 08/12/2017 CLINICAL DATA:  Found on floor by son femoral fall, history dementia, atrial fibrillation, asthma, coronary artery disease post MI and CABG, CHF, type II diabetes mellitus EXAM: CT HEAD WITHOUT CONTRAST CT CERVICAL SPINE WITHOUT CONTRAST TECHNIQUE: Multidetector CT imaging of the head and cervical spine was performed following the standard protocol without intravenous contrast. Multiplanar CT image reconstructions of the cervical spine were also generated. COMPARISON:  CT head 04/22/2016 FINDINGS: CT HEAD FINDINGS Brain: Generalized atrophy. Normal ventricular morphology. No midline shift or mass effect. Small vessel chronic ischemic changes of deep cerebral white matter. Small old lacunar infarct at LEFT caudate head. Tiny BILATERAL old deep cerebellar infarcts. No intracranial hemorrhage, mass lesion, evidence of acute infarction, or extra-axial fluid collection. Vascular: Atherosclerotic calcification of internal carotid and vertebral arteries at skullbase. No hyperdense vessels. Skull: Intact Sinuses/Orbits: Clear Other: N/A CT CERVICAL SPINE FINDINGS Alignment: Normal Skull base and vertebrae: Bones demineralized. Vertebral body heights maintained. No fracture, subluxation or bone  destruction. Multilevel facet degenerative changes. Soft tissues and spinal canal: Prevertebral soft tissues normal thickness. Regional cervical soft tissues otherwise unremarkable. Disc levels:  Grossly unremarkable. Upper chest: Tiny nodular foci at both lung apices up to 4 mm diameter, noncalcified. No infiltrate or pleural effusion. Other: Atherosclerotic calcification at the carotid bifurcations bilaterally. IMPRESSION: Atrophy with small vessel chronic ischemic changes of deep cerebral white matter. Old lacunar infarcts within cerebellum and at LEFT caudate head. No acute intracranial abnormalities. No acute cervical spine abnormalities. Tiny nodular foci at lung apices up to 4 mm diameter; patient has chronic interstitial prominence on prior chest radiographs without discrete mass/nodule. Significance of current findings is uncertain and if clinically indicated follow-up CT chest could be performed for further assessment. Electronically Signed   By: Lavonia Dana M.D.   On: 08/12/2017 17:41   Dg Hip Unilat W Or Wo Pelvis 2-3 Views Right  Result Date: 08/12/2017 CLINICAL DATA:  Recent fall with hip pain, initial encounter EXAM: DG HIP (WITH OR WITHOUT PELVIS) 2-3V RIGHT COMPARISON:  None. FINDINGS: The pelvic ring is intact. Degenerative changes of the hip joints and lumbar spine are noted. No definitive acute fracture or dislocation is noted. No soft tissue changes are seen. IMPRESSION: Degenerative change without acute abnormality. Electronically Signed   By: Inez Catalina M.D.   On: 08/12/2017 20:20     ASSESSMENT AND PLAN:   83 year old female with dementia who presents after a fall   1. Rhabdomylysis after fall CK is improving Continue IV fluids CK in a.m.  2. Guaiac positive stool: Hemoglobin relatively stable At this stage would not advise any GI invasive workup Change Protonix to oral today or tomorrow after GI evaluation   3. Fall: PT consultation requested  4. Dementia: Appears  to be at baseline  5. Elevated troponin in the setting of her abdomen lysis. Patient has been ruled out for ACS DC telemetry  6. Diabetes: Continue sliding scale  7. Essential hypertension: Continue atenolol and isosorbide  8. Hypothyroid: Continue Synthroid  9. PAF: Continue digoxin and atenolol  10. Chronic  diastolic heart failure without signs of exacerbation  Management plans discussed with nursing  CODE STATUS: Full  TOTAL TIME TAKING CARE OF THIS PATIENT: 30 minutes.     POSSIBLE D/C 1-2 days, DEPENDING ON CLINICAL CONDITION.   Jill Howell M.D on 08/13/2017 at 11:03 AM  Between 7am to 6pm - Pager - [phone] After 6pm go to [email or web address] - password [email or web address] [facility]  Office  [phone]  CC: Primary care physician; Tonia Ghent, MD  Note: This dictation was prepared with Dragon dictation along with smaller phrase technology. Any transcriptional errors that result from this process are unintentional.

## 2017-08-14 LAB — GLUCOSE, CAPILLARY
GLUCOSE-CAPILLARY: 69 mg/dL (ref 65–99)
GLUCOSE-CAPILLARY: 101 mg/dL — AB (ref 65–99)
GLUCOSE-CAPILLARY: 125 mg/dL — AB (ref 65–99)
Glucose-Capillary: 115 mg/dL — ABNORMAL HIGH (ref 65–99)

## 2017-08-14 LAB — BASIC METABOLIC PANEL
Anion gap: 10 (ref 5–15)
BUN: 23 mg/dL — AB (ref 6–20)
CHLORIDE: 109 mmol/L (ref 101–111)
CO2: 20 mmol/L — ABNORMAL LOW (ref 22–32)
Calcium: 8.1 mg/dL — ABNORMAL LOW (ref 8.9–10.3)
Creatinine, Ser: 1.03 mg/dL — ABNORMAL HIGH (ref 0.44–1.00)
GFR calc Af Amer: 51 mL/min — ABNORMAL LOW (ref >60)
GFR, EST NON AFRICAN AMERICAN: 44 mL/min — AB (ref >60)
GLUCOSE: 80 mg/dL (ref 65–99)
POTASSIUM: 3.3 mmol/L — AB (ref 3.5–5.1)
Sodium: 139 mmol/L (ref 135–145)

## 2017-08-14 LAB — CBC
HCT: 32.2 % — ABNORMAL LOW (ref 35.0–47.0)
Hemoglobin: 11 g/dL — ABNORMAL LOW (ref 12.0–16.0)
MCH: 30.9 pg (ref 26.0–34.0)
MCHC: 34.2 g/dL (ref 32.0–36.0)
MCV: 90.2 fL (ref 80.0–100.0)
Platelets: 134 10*3/uL — ABNORMAL LOW (ref 150–440)
RBC: 3.57 MIL/uL — ABNORMAL LOW (ref 3.80–5.20)
RDW: 14.9 % — ABNORMAL HIGH (ref 11.5–14.5)
WBC: 9.4 10*3/uL (ref 3.6–11.0)

## 2017-08-14 LAB — CK
CK TOTAL: 1812 U/L — AB (ref 38–234)
CK TOTAL: 2019 U/L — AB (ref 38–234)
Total CK: 1732 U/L — ABNORMAL HIGH (ref 38–234)

## 2017-08-14 MED ORDER — VITAMIN B-12 1000 MCG PO TABS
1000.0000 ug | ORAL_TABLET | Freq: Every day | ORAL | Status: DC
  Administered 2017-08-14 – 2017-08-16 (×3): 1000 ug via ORAL
  Filled 2017-08-14 (×3): qty 1

## 2017-08-14 MED ORDER — POTASSIUM CHLORIDE CRYS ER 20 MEQ PO TBCR
40.0000 meq | EXTENDED_RELEASE_TABLET | Freq: Once | ORAL | Status: AC
  Administered 2017-08-14: 40 meq via ORAL
  Filled 2017-08-14: qty 2

## 2017-08-14 MED ORDER — DEXTROSE 50 % IV SOLN
25.0000 mL | Freq: Once | INTRAVENOUS | Status: AC
  Administered 2017-08-14: 25 mL via INTRAVENOUS
  Filled 2017-08-14: qty 50

## 2017-08-14 MED ORDER — DIPHENHYDRAMINE HCL 12.5 MG/5ML PO ELIX
12.5000 mg | ORAL_SOLUTION | Freq: Once | ORAL | Status: AC
  Administered 2017-08-14: 12.5 mg via ORAL
  Filled 2017-08-14: qty 5

## 2017-08-14 MED ORDER — OCUVITE-LUTEIN PO CAPS
1.0000 | ORAL_CAPSULE | Freq: Every day | ORAL | Status: DC
  Administered 2017-08-14 – 2017-08-16 (×3): 1 via ORAL
  Filled 2017-08-14 (×3): qty 1

## 2017-08-14 MED ORDER — PANTOPRAZOLE SODIUM 40 MG IV SOLR
40.0000 mg | Freq: Two times a day (BID) | INTRAVENOUS | Status: DC
  Administered 2017-08-14: 40 mg via INTRAVENOUS
  Filled 2017-08-14: qty 40

## 2017-08-14 MED ORDER — LOSARTAN POTASSIUM 25 MG PO TABS
25.0000 mg | ORAL_TABLET | Freq: Every day | ORAL | Status: DC
  Administered 2017-08-14 – 2017-08-16 (×3): 25 mg via ORAL
  Filled 2017-08-14 (×3): qty 1

## 2017-08-14 MED ORDER — PANTOPRAZOLE SODIUM 40 MG PO TBEC
40.0000 mg | DELAYED_RELEASE_TABLET | Freq: Two times a day (BID) | ORAL | Status: DC
  Administered 2017-08-14 – 2017-08-16 (×5): 40 mg via ORAL
  Filled 2017-08-14 (×5): qty 1

## 2017-08-14 NOTE — Progress Notes (Signed)
[facility] [place] NAME: Jill Howell    MR#:  [id number]  DATE OF BIRTH:  1919-02-16  SUBJECTIVE:   No acute events overnight  Planned for discharge today but CK still elevated   REVIEW OF SYSTEMS:   Patient with dementia unable to obtain a thorough review of systems    Tolerating Diet: yes      DRUG ALLERGIES:   Allergies  Allergen Reactions  . Furosemide Other (See Comments)    Patient prefers not to take med, weakness and frequent urination  . Penicillins Other (See Comments)    unknown    VITALS:  Blood pressure (!) 141/54, pulse 70, temperature (!) 97.3 F (36.3 C), temperature source Oral, resp. rate 17, height 5\' 4"  (1.626 m), weight 59 kg (130 lb), SpO2 96 %.  PHYSICAL EXAMINATION:  Constitutional: Appears well-developed and well-nourished. No distress. HENT: Normocephalic. [name] Oropharynx is clear and moist.  Eyes: Conjunctivae and EOM are normal. PERRLA, no scleral icterus.  Neck: Normal ROM. Neck supple. No JVD. No tracheal deviation. CVS: RRR, S1/S2 +, no murmurs, no gallops, no carotid bruit.  Pulmonary: Effort and breath sounds normal, no stridor, rhonchi, wheezes, rales.  Abdominal: Soft. BS +,  no distension, tenderness, rebound or guarding.  Musculoskeletal: Normal range of motion. No edema and no tenderness.  Neuro: Alert. CN 2-12 grossly intact. No focal deficits. Oriented to name Skin: Skin is warm and dry. No rash noted. Psychiatric: Dementia     LABORATORY PANEL:   CBC Recent Labs  Lab 08/14/17 0418  WBC 9.4  HGB 11.0*  HCT 32.2*  PLT 134*   ------------------------------------------------------------------------------------------------------------------  Chemistries  Recent Labs  Lab 08/13/17 0132 08/14/17 0418  NA 134* 139  K 3.5 3.3*  CL 103 109  CO2 21* 20*  GLUCOSE 111* 80  BUN 29* 23*  CREATININE 1.25* 1.03*  CALCIUM 8.1* 8.1*  AST 136*  --   ALT 35  --   ALKPHOS 53  --    BILITOT 1.2  --    ------------------------------------------------------------------------------------------------------------------  Cardiac Enzymes Recent Labs  Lab 08/12/17 2032 08/13/17 0132 08/13/17 0730  TROPONINI 0.06* 0.06* 0.06*   ------------------------------------------------------------------------------------------------------------------  RADIOLOGY:  Ct Head Wo Contrast  Result Date: 08/12/2017 CLINICAL DATA:  Found on floor by son femoral fall, history dementia, atrial fibrillation, asthma, coronary artery disease post MI and CABG, CHF, type II diabetes mellitus EXAM: CT HEAD WITHOUT CONTRAST CT CERVICAL SPINE WITHOUT CONTRAST TECHNIQUE: Multidetector CT imaging of the head and cervical spine was performed following the standard protocol without intravenous contrast. Multiplanar CT image reconstructions of the cervical spine were also generated. COMPARISON:  CT head 04/22/2016 FINDINGS: CT HEAD FINDINGS Brain: Generalized atrophy. Normal ventricular morphology. No midline shift or mass effect. Small vessel chronic ischemic changes of deep cerebral white matter. Small old lacunar infarct at LEFT caudate head. Tiny BILATERAL old deep cerebellar infarcts. No intracranial hemorrhage, mass lesion, evidence of acute infarction, or extra-axial fluid collection. Vascular: Atherosclerotic calcification of internal carotid and vertebral arteries at skullbase. No hyperdense vessels. Skull: Intact Sinuses/Orbits: Clear Other: N/A CT CERVICAL SPINE FINDINGS Alignment: Normal Skull base and vertebrae: Bones demineralized. Vertebral body heights maintained. No fracture, subluxation or bone destruction. Multilevel facet degenerative changes. Soft tissues and spinal canal: Prevertebral soft tissues normal thickness. Regional cervical soft tissues otherwise unremarkable. Disc levels:  Grossly unremarkable. Upper chest: Tiny nodular foci at both lung apices up to 4 mm diameter, noncalcified. No  infiltrate or  pleural effusion. Other: Atherosclerotic calcification at the carotid bifurcations bilaterally. IMPRESSION: Atrophy with small vessel chronic ischemic changes of deep cerebral white matter. Old lacunar infarcts within cerebellum and at LEFT caudate head. No acute intracranial abnormalities. No acute cervical spine abnormalities. Tiny nodular foci at lung apices up to 4 mm diameter; patient has chronic interstitial prominence on prior chest radiographs without discrete mass/nodule. Significance of current findings is uncertain and if clinically indicated follow-up CT chest could be performed for further assessment. Electronically Signed   By: Lavonia Dana M.D.   On: 08/12/2017 17:41   Ct Cervical Spine Wo Contrast  Result Date: 08/12/2017 CLINICAL DATA:  Found on floor by son femoral fall, history dementia, atrial fibrillation, asthma, coronary artery disease post MI and CABG, CHF, type II diabetes mellitus EXAM: CT HEAD WITHOUT CONTRAST CT CERVICAL SPINE WITHOUT CONTRAST TECHNIQUE: Multidetector CT imaging of the head and cervical spine was performed following the standard protocol without intravenous contrast. Multiplanar CT image reconstructions of the cervical spine were also generated. COMPARISON:  CT head 04/22/2016 FINDINGS: CT HEAD FINDINGS Brain: Generalized atrophy. Normal ventricular morphology. No midline shift or mass effect. Small vessel chronic ischemic changes of deep cerebral white matter. Small old lacunar infarct at LEFT caudate head. Tiny BILATERAL old deep cerebellar infarcts. No intracranial hemorrhage, mass lesion, evidence of acute infarction, or extra-axial fluid collection. Vascular: Atherosclerotic calcification of internal carotid and vertebral arteries at skullbase. No hyperdense vessels. Skull: Intact Sinuses/Orbits: Clear Other: N/A CT CERVICAL SPINE FINDINGS Alignment: Normal Skull base and vertebrae: Bones demineralized. Vertebral body heights maintained. No fracture,  subluxation or bone destruction. Multilevel facet degenerative changes. Soft tissues and spinal canal: Prevertebral soft tissues normal thickness. Regional cervical soft tissues otherwise unremarkable. Disc levels:  Grossly unremarkable. Upper chest: Tiny nodular foci at both lung apices up to 4 mm diameter, noncalcified. No infiltrate or pleural effusion. Other: Atherosclerotic calcification at the carotid bifurcations bilaterally. IMPRESSION: Atrophy with small vessel chronic ischemic changes of deep cerebral white matter. Old lacunar infarcts within cerebellum and at LEFT caudate head. No acute intracranial abnormalities. No acute cervical spine abnormalities. Tiny nodular foci at lung apices up to 4 mm diameter; patient has chronic interstitial prominence on prior chest radiographs without discrete mass/nodule. Significance of current findings is uncertain and if clinically indicated follow-up CT chest could be performed for further assessment. Electronically Signed   By: Lavonia Dana M.D.   On: 08/12/2017 17:41   Dg Hip Unilat W Or Wo Pelvis 2-3 Views Right  Result Date: 08/12/2017 CLINICAL DATA:  Recent fall with hip pain, initial encounter EXAM: DG HIP (WITH OR WITHOUT PELVIS) 2-3V RIGHT COMPARISON:  None. FINDINGS: The pelvic ring is intact. Degenerative changes of the hip joints and lumbar spine are noted. No definitive acute fracture or dislocation is noted. No soft tissue changes are seen. IMPRESSION: Degenerative change without acute abnormality. Electronically Signed   By: Inez Catalina M.D.   On: 08/12/2017 20:20     ASSESSMENT AND PLAN:  83 year old female with dementia who presents after a fall   1. Rhabdomylysis after fall CK HAS IMPROVED WITH IV fluids,however not quite normal and I am unsure now that patient will be able to drink enough fluids so d/c held for today.  She is encouraged to drink fluids. Physical therapy has recommended skilled nursing facility upon discharge  2.  Guaiac positive stool: Hemoglobin has been stable. There is no evidence of GI bleed. She was evaluated by GI consultant. At this time  given her age, stable hemoglobin and stable hemodynamics there are no recommendations for invasive workup. Family is in agreement with this   3. Fall: PT consultation has recommended skilled nursing facility upon discharge. 4. Dementia: Appears to be at baseline  5. Elevated troponin in the setting of her abdomen lysis. Patient has been ruled out for ACS DC telemetry  6. Essential hypertension: Continue atenolol and isosorbide  7. Hypothyroid: Continue Synthroid  8. PAF: Continue digoxin and atenolol  9. Chronic diastolic heart failure without signs of exacerbation  10. Diabetes: It appears to be diet controlled     Management plans discussed with nursing and patient's son  CODE STATUS: Full  TOTAL TIME TAKING CARE OF THIS PATIENT: 24 minutes.     POSSIBLE D/C tomorrow DEPENDING ON CLINICAL CONDITION.   Deston Bilyeu M.D on 08/14/2017 at 8:47 AM  Between 7am to 6pm - Pager - [phone] After 6pm go to [email or web address] - password [email or web address] [facility]  Office  [phone]  CC: Primary care physician; Tonia Ghent, MD  Note: This dictation was prepared with Dragon dictation along with smaller phrase technology. Any transcriptional errors that result from this process are unintentional.

## 2017-08-14 NOTE — Discharge Summary (Addendum)
[facility] NAME: Jill Howell    MR#:  [id number]  DATE OF BIRTH:  1919/02/17  DATE OF ADMISSION:  08/12/2017 ADMITTING PHYSICIAN: Nicholes Mango, MD  DATE OF DISCHARGE: 08/15/2017  PRIMARY CARE PHYSICIAN: Tonia Ghent, MD    ADMISSION DIAGNOSIS:  Fall, initial encounter [phone]] Traumatic rhabdomyolysis, initial encounter ([place]) [T79.6XXA] Gastrointestinal hemorrhage, unspecified gastrointestinal hemorrhage type [K92.2]  DISCHARGE DIAGNOSIS:  Active Problems:   Rhabdomyolysis   SECONDARY DIAGNOSIS:   Past Medical History:  Diagnosis Date  . Allergic rhinitis, cause unspecified   . Anemia   . Asthma   . Atrial fibrillation ([place])    a. remote->no anticoagulation 2/2 h/o falls.  [name] CAD (coronary artery disease)    a. 2000 s/p MI/CABG x 4 (VG-PDA, VG-OM, VG-Diag, LIMA-LAD);  b. 2011 MI;  c. 08/2013 NSTEMI/Cath: LM 90-95, LAD 90/100p, LCX 99p, 159m, RCA 100ost, VG-PDA nl, VG-OM 100 (culprit w/ thrombus), VG-Diag nl, LIMA-LAD nl, EF 55-60%, mild inf HK, mod MR-->Med Rx.  . Carotid arterial disease ([place])    a. 05/2013 s/p L CEA.  . Chronic diastolic CHF (congestive heart failure) ([place])    a. 08/2013 Echo: EF 50-55%, no rwma, Gr2 DD, mod MR, mildly dil LA, PASP 70mmHg.  [name] Esophageal reflux   . History of TIA (transient ischemic attack)   . Hyperlipidemia   . Hypertension   . Hypothyroidism   . Myocardial infarction ([place])    08-2013  . Type II or unspecified type diabetes mellitus without mention of complication, not stated as uncontrolled   . Unspecified hearing loss     HOSPITAL COURSE:  83 year old female with dementia who presents after a fall   1. Rhabdomylysis after fall CK HAS IMPROVED WITH IV fluids She is encouraged to drink fluids. Physical therapy has recommended skilled nursing facility upon discharge  2. Guaiac positive stool: Hemoglobin has been stable. There is no evidence of GI bleed. She was  evaluated by GI consultant. At this time given her age, stable hemoglobin and stable hemodynamics there are no recommendations for invasive workup. Family is in agreement with this   3. Fall: PT consultation has recommended skilled nursing facility upon discharge. 4. Dementia: Appears to be at baseline  5. Elevated troponin in the setting of her abdomen lysis. Patient has been ruled out for ACS DC telemetry  6. Essential hypertension: Continue atenolol and isosorbide  7. Hypothyroid: Continue Synthroid  8. PAF: Continue digoxin and atenolol  9. Chronic diastolic heart failure without signs of exacerbation  10. Diabetes: It appears to be diet controlled    DISCHARGE CONDITIONS AND DIET:   Stable for discharge on diabetic heart healthy diet  CONSULTS OBTAINED:    DRUG ALLERGIES:   Allergies  Allergen Reactions  . Furosemide Other (See Comments)    Patient prefers not to take med, weakness and frequent urination  . Penicillins Other (See Comments)    unknown    DISCHARGE MEDICATIONS:   Allergies as of 08/14/2017      Reactions   Furosemide Other (See Comments)   Patient prefers not to take med, weakness and frequent urination   Penicillins Other (See Comments)   unknown      Medication List    STOP taking these medications   simvastatin 40 MG tablet Commonly known as:  ZOCOR     TAKE these medications   aspirin 81 MG EC tablet Take 81 mg by mouth daily.   atenolol 25  MG tablet Commonly known as:  TENORMIN Take 1 tablet (25 mg total) by mouth daily.   BIOTIN PO Take 1 capsule by mouth daily.   DIGOX 0.125 MG tablet Generic drug:  digoxin Take 1 tablet by mouth daily.   isosorbide mononitrate 30 MG 24 hr tablet Commonly known as:  IMDUR TAKE ONE TABLET EVERY DAY   levothyroxine 100 MCG tablet Commonly known as:  SYNTHROID, LEVOTHROID Take 1 tablet (100 mcg total) by mouth daily.   losartan 25 MG tablet Commonly known as:  COZAAR TAKE ONE  TABLET EVERY DAY   nitroGLYCERIN 0.4 MG SL tablet Commonly known as:  NITROSTAT PLACE 1 TABLET UNDER THE TONGUE EVERY 5 MINUTES AS NEEDED FOR CHEST PAIN.   pantoprazole 40 MG tablet Commonly known as:  PROTONIX TAKE ONE TABLET EVERY DAY   PRESERVISION AREDS PO Take 1 tablet by mouth daily.   QUEtiapine 25 MG tablet Commonly known as:  SEROQUEL TAKE ONE TABLET BY MOUTH EVERY EVENING AT BEDTIME   vitamin B-12 1000 MCG tablet Commonly known as:  CYANOCOBALAMIN Take 1,000 mcg by mouth daily.         Today   CHIEF COMPLAINT:  No acute issues overnight   VITAL SIGNS:  Blood pressure (!) 141/54, pulse 70, temperature (!) 97.3 F (36.3 C), temperature source Oral, resp. rate 17, height 5\' 4"  (1.626 m), weight 59 kg (130 lb), SpO2 96 %.   REVIEW OF SYSTEMS:  Review of Systems  Unable to perform ROS: Dementia     PHYSICAL EXAMINATION:  GENERAL:  83 y.o.-year-old patient lying in the bed with no acute distress.  NECK:  Supple, no jugular venous distention. No thyroid enlargement, no tenderness.  LUNGS: Normal breath sounds bilaterally, no wheezing, rales,rhonchi  No use of accessory muscles of respiration.  CARDIOVASCULAR: S1, S2 normal. No murmurs, rubs, or gallops.  ABDOMEN: Soft, non-tender, non-distended. Bowel sounds present. No organomegaly or mass.  EXTREMITIES: No pedal edema, cyanosis, or clubbing.  PSYCHIATRIC: The patient is alert and oriented x name , placeand month not year SKIN: No obvious rash, lesion, or ulcer.   DATA REVIEW:   CBC Recent Labs  Lab 08/14/17 0418  WBC 9.4  HGB 11.0*  HCT 32.2*  PLT 134*    Chemistries  Recent Labs  Lab 08/13/17 0132 08/14/17 0418  NA 134* 139  K 3.5 3.3*  CL 103 109  CO2 21* 20*  GLUCOSE 111* 80  BUN 29* 23*  CREATININE 1.25* 1.03*  CALCIUM 8.1* 8.1*  AST 136*  --   ALT 35  --   ALKPHOS 53  --   BILITOT 1.2  --     Cardiac Enzymes Recent Labs  Lab 08/12/17 2032 08/13/17 0132 08/13/17 0730   TROPONINI 0.06* 0.06* 0.06*    Microbiology Results  @MICRORSLT48 @  RADIOLOGY:  Ct Head Wo Contrast  Result Date: 08/12/2017 CLINICAL DATA:  Found on floor by son femoral fall, history dementia, atrial fibrillation, asthma, coronary artery disease post MI and CABG, CHF, type II diabetes mellitus EXAM: CT HEAD WITHOUT CONTRAST CT CERVICAL SPINE WITHOUT CONTRAST TECHNIQUE: Multidetector CT imaging of the head and cervical spine was performed following the standard protocol without intravenous contrast. Multiplanar CT image reconstructions of the cervical spine were also generated. COMPARISON:  CT head 04/22/2016 FINDINGS: CT HEAD FINDINGS Brain: Generalized atrophy. Normal ventricular morphology. No midline shift or mass effect. Small vessel chronic ischemic changes of deep cerebral white matter. Small old lacunar infarct at LEFT caudate head.  Tiny BILATERAL old deep cerebellar infarcts. No intracranial hemorrhage, mass lesion, evidence of acute infarction, or extra-axial fluid collection. Vascular: Atherosclerotic calcification of internal carotid and vertebral arteries at skullbase. No hyperdense vessels. Skull: Intact Sinuses/Orbits: Clear Other: N/A CT CERVICAL SPINE FINDINGS Alignment: Normal Skull base and vertebrae: Bones demineralized. Vertebral body heights maintained. No fracture, subluxation or bone destruction. Multilevel facet degenerative changes. Soft tissues and spinal canal: Prevertebral soft tissues normal thickness. Regional cervical soft tissues otherwise unremarkable. Disc levels:  Grossly unremarkable. Upper chest: Tiny nodular foci at both lung apices up to 4 mm diameter, noncalcified. No infiltrate or pleural effusion. Other: Atherosclerotic calcification at the carotid bifurcations bilaterally. IMPRESSION: Atrophy with small vessel chronic ischemic changes of deep cerebral white matter. Old lacunar infarcts within cerebellum and at LEFT caudate head. No acute intracranial  abnormalities. No acute cervical spine abnormalities. Tiny nodular foci at lung apices up to 4 mm diameter; patient has chronic interstitial prominence on prior chest radiographs without discrete mass/nodule. Significance of current findings is uncertain and if clinically indicated follow-up CT chest could be performed for further assessment. Electronically Signed   By: Lavonia Dana M.D.   On: 08/12/2017 17:41   Ct Cervical Spine Wo Contrast  Result Date: 08/12/2017 CLINICAL DATA:  Found on floor by son femoral fall, history dementia, atrial fibrillation, asthma, coronary artery disease post MI and CABG, CHF, type II diabetes mellitus EXAM: CT HEAD WITHOUT CONTRAST CT CERVICAL SPINE WITHOUT CONTRAST TECHNIQUE: Multidetector CT imaging of the head and cervical spine was performed following the standard protocol without intravenous contrast. Multiplanar CT image reconstructions of the cervical spine were also generated. COMPARISON:  CT head 04/22/2016 FINDINGS: CT HEAD FINDINGS Brain: Generalized atrophy. Normal ventricular morphology. No midline shift or mass effect. Small vessel chronic ischemic changes of deep cerebral white matter. Small old lacunar infarct at LEFT caudate head. Tiny BILATERAL old deep cerebellar infarcts. No intracranial hemorrhage, mass lesion, evidence of acute infarction, or extra-axial fluid collection. Vascular: Atherosclerotic calcification of internal carotid and vertebral arteries at skullbase. No hyperdense vessels. Skull: Intact Sinuses/Orbits: Clear Other: N/A CT CERVICAL SPINE FINDINGS Alignment: Normal Skull base and vertebrae: Bones demineralized. Vertebral body heights maintained. No fracture, subluxation or bone destruction. Multilevel facet degenerative changes. Soft tissues and spinal canal: Prevertebral soft tissues normal thickness. Regional cervical soft tissues otherwise unremarkable. Disc levels:  Grossly unremarkable. Upper chest: Tiny nodular foci at both lung apices  up to 4 mm diameter, noncalcified. No infiltrate or pleural effusion. Other: Atherosclerotic calcification at the carotid bifurcations bilaterally. IMPRESSION: Atrophy with small vessel chronic ischemic changes of deep cerebral white matter. Old lacunar infarcts within cerebellum and at LEFT caudate head. No acute intracranial abnormalities. No acute cervical spine abnormalities. Tiny nodular foci at lung apices up to 4 mm diameter; patient has chronic interstitial prominence on prior chest radiographs without discrete mass/nodule. Significance of current findings is uncertain and if clinically indicated follow-up CT chest could be performed for further assessment. Electronically Signed   By: Lavonia Dana M.D.   On: 08/12/2017 17:41   Dg Hip Unilat W Or Wo Pelvis 2-3 Views Right  Result Date: 08/12/2017 CLINICAL DATA:  Recent fall with hip pain, initial encounter EXAM: DG HIP (WITH OR WITHOUT PELVIS) 2-3V RIGHT COMPARISON:  None. FINDINGS: The pelvic ring is intact. Degenerative changes of the hip joints and lumbar spine are noted. No definitive acute fracture or dislocation is noted. No soft tissue changes are seen. IMPRESSION: Degenerative change without acute abnormality. Electronically Signed  By: Inez Catalina M.D.   On: 08/12/2017 20:20      Allergies as of 08/14/2017      Reactions   Furosemide Other (See Comments)   Patient prefers not to take med, weakness and frequent urination   Penicillins Other (See Comments)   unknown      Medication List    STOP taking these medications   simvastatin 40 MG tablet Commonly known as:  ZOCOR     TAKE these medications   aspirin 81 MG EC tablet Take 81 mg by mouth daily.   atenolol 25 MG tablet Commonly known as:  TENORMIN Take 1 tablet (25 mg total) by mouth daily.   BIOTIN PO Take 1 capsule by mouth daily.   DIGOX 0.125 MG tablet Generic drug:  digoxin Take 1 tablet by mouth daily.   isosorbide mononitrate 30 MG 24 hr  tablet Commonly known as:  IMDUR TAKE ONE TABLET EVERY DAY   levothyroxine 100 MCG tablet Commonly known as:  SYNTHROID, LEVOTHROID Take 1 tablet (100 mcg total) by mouth daily.   losartan 25 MG tablet Commonly known as:  COZAAR TAKE ONE TABLET EVERY DAY   nitroGLYCERIN 0.4 MG SL tablet Commonly known as:  NITROSTAT PLACE 1 TABLET UNDER THE TONGUE EVERY 5 MINUTES AS NEEDED FOR CHEST PAIN.   pantoprazole 40 MG tablet Commonly known as:  PROTONIX TAKE ONE TABLET EVERY DAY   PRESERVISION AREDS PO Take 1 tablet by mouth daily.   QUEtiapine 25 MG tablet Commonly known as:  SEROQUEL TAKE ONE TABLET BY MOUTH EVERY EVENING AT BEDTIME   vitamin B-12 1000 MCG tablet Commonly known as:  CYANOCOBALAMIN Take 1,000 mcg by mouth daily.        Management plans discussed with the patient's son and he is in agreement. Stable for discharge   Patient should follow up with pcp  CODE STATUS:     Code Status Orders  (From admission, onward)        Start     Ordered   08/12/17 2146  Full code  Continuous     08/12/17 2145    Code Status History    Date Active Date Inactive Code Status Order ID Comments User Context   04/22/2016 18:16 04/25/2016 17:36 Full Code [id number]  Lytle Butte, MD ED   02/20/2016 04:49 02/21/2016 18:31 Full Code [id number]  Toy Baker, MD ED   08/10/2013 13:25 08/12/2013 15:46 Full Code [id number]  Rogelia Mire, NP Inpatient      TOTAL TIME TAKING CARE OF THIS PATIENT: 38 minutes.  Plan of care discussed with patient's son.  Note: This dictation was prepared with Dragon dictation along with smaller phrase technology. Any transcriptional errors that result from this process are unintentional.  Rayden Dock M.D on 08/14/2017 at 8:14 AM  Between 7am to 6pm - Pager - [phone] After 6pm go to [email or web address] - password [email or web address] [facility]  Office  [phone]  CC: Primary care physician; Tonia Ghent, MD

## 2017-08-14 NOTE — Progress Notes (Signed)
Physical Therapy Treatment Patient Details Name: Jill Howell MRN: [id number] DOB: 1918-10-04 Today's Date: 08/14/2017    History of Present Illness Pt admitted for rhabdomyolysis.  PMH includes dementia, CAD, atrial fibrillation, Htn, hyperlipidemia, DM, allergic rhinitis, anemia, CHF and TIA.    PT Comments    Pt was able to progress ambulatory distance today to 300 ft with no report of pain or fatigue.  Pt required min assist for bed mobility today, presenting as mildly lethargic and confused when PT entered the room.  Pt requires redirection as she presented with erratic behavior today and unsafe management of RW.  Pt was able to complete full sets of therapeutic exercises for LE with not difficulty.  She reported and demonstrated understanding of importance of continuation of therapy to improve mobility.  Pt will continue to benefit from skilled PT with focus on strength, balance, safe management of DME and functional mobility.   Follow Up Recommendations  SNF     Equipment Recommendations       Recommendations for Other Services       Precautions / Restrictions Precautions Precautions: Fall Restrictions Weight Bearing Restrictions: No    Mobility  Bed Mobility Overal bed mobility: Needs Assistance Bed Mobility: Supine to Sit Rolling: Modified independent (Device/Increase time) Sidelying to sit: Min assist Supine to sit: Min assist     General bed mobility comments: Pt required hand held assist for initiation of supine to sit.  Transfers Overall transfer level: Modified independent Equipment used: Rolling walker (2 wheeled) Transfers: Sit to/from Stand Sit to Stand: Min guard         General transfer comment: Pt presented as more confused today and needing increased time for transfers.  Pt provided CGA but pt was able to initiate STS with RW.  Ambulation/Gait Ambulation/Gait assistance: Supervision Ambulation Distance (Feet): 300 Feet Assistive device: Rolling  walker (2 wheeled)     Gait velocity interpretation: at or above normal speed for age/gender General Gait Details: Pt presented with decreased hip and knee flexion as well as decreased foot clearance during gait.  PT provided VC's for safe ambulation.  Pt demonstrated erratic behavior, picking the RW up and moving toward a person walking down the hallway as though to try to startle him.  When corrected by PT, pt refocused.   Stairs            Wheelchair Mobility    Modified Rankin (Stroke Patients Only)       Balance Overall balance assessment: Modified Independent Sitting-balance support: Bilateral upper extremity supported       Standing balance support: Bilateral upper extremity supported   Standing balance comment: Required RW for balance.                            Cognition Arousal/Alertness: Awake/alert Behavior During Therapy: WFL for tasks assessed/performed Overall Cognitive Status: Within Functional Limits for tasks assessed                                        Exercises General Exercises - Lower Extremity Ankle Circles/Pumps: AROM;20 reps;Seated Long Arc Quad: Strengthening;Both;10 reps Hip Flexion/Marching: AROM;20 reps;Seated    General Comments        Pertinent Vitals/Pain Pain Assessment: No/denies pain    Home Living  Prior Function            PT Goals (current goals can now be found in the care plan section) Acute Rehab PT Goals Patient Stated Goal: To be able to go home and continue to be mobile. PT Goal Formulation: With patient Time For Goal Achievement: 08/28/17 Potential to Achieve Goals: Good Progress towards PT goals: Progressing toward goals    Frequency    Min 2X/week      PT Plan Current plan remains appropriate    Co-evaluation              AM-PAC PT "6 Clicks" Daily Activity  Outcome Measure  Difficulty turning over in bed (including adjusting  bedclothes, sheets and blankets)?: A Little Difficulty moving from lying on back to sitting on the side of the bed? : A Little Difficulty sitting down on and standing up from a chair with arms (e.g., wheelchair, bedside commode, etc,.)?: A Little Help needed moving to and from a bed to chair (including a wheelchair)?: A Little Help needed walking in hospital room?: A Little Help needed climbing 3-5 steps with a railing? : A Little 6 Click Score: 18    End of Session Equipment Utilized During Treatment: Gait belt Activity Tolerance: Patient tolerated treatment well Patient left: in chair;with call bell/phone within reach;with bed alarm set   PT Visit Diagnosis: Unsteadiness on feet (R26.81);Repeated falls (R29.6);Muscle weakness (generalized) (M62.81)     Time: 4967-5916 PT Time Calculation (min) (ACUTE ONLY): 25 min  Charges:  $Gait Training: 8-22 mins $Therapeutic Exercise: 8-22 mins                    G Codes:  Functional Assessment Tool Used: AM-PAC 6 Clicks Basic Mobility    Roxanne Gates, PT, DPT    Roxanne Gates 08/14/2017, 4:28 PM

## 2017-08-14 NOTE — Care Management Important Message (Signed)
Important Message  Patient Details  Name: Gurneet Matarese Eastep MRN: [id number] Date of Birth: 07-22-1919   Medicare Important Message Given:  Yes    Beverly Sessions, RN 08/14/2017, 3:30 PM

## 2017-08-14 NOTE — Clinical Social Work Note (Signed)
Clinical Social Work Assessment  Patient Details  Name: Jill Howell MRN: [id number] Date of Birth: 07/10/1919  Date of referral:  08/14/17               Reason for consult:  Discharge Planning                Permission sought to share information with:    Permission granted to share information::     Name::        Agency::     Relationship::     Contact Information:     Housing/Transportation Living arrangements for the past 2 months:  Single Family Home Source of Information:  Adult Children Patient Interpreter Needed:  None Criminal Activity/Legal Involvement Pertinent to Current Situation/Hospitalization:  No - Comment as needed Significant Relationships:  Adult Children Lives with:  Self Do you feel safe going back to the place where you live?  Yes Need for family participation in patient care:  Yes (Comment)  Care giving concerns:  Patient resides alone with her 4 sons making sure she has what she needs and checking in on her daily.   Social Worker assessment / plan:  CSW spoke with Robert Briles [phone]), one of patient's sons, and he stated that patient has been to [facility] in the past when she stopped eating for a period of time and her sodium was low. He stated [facility] was able to get her back to baseline and she has been doing well. Mr. Dall stated stated that they were hoping she could go to [facility] again and receive rehab. He states that after she discharges from rehab, they are going to step up their care for her and stated that they would preparing her meals now and assisting her with feeds if needed.   Employment status:  Retired Forensic scientist:  Medicare PT Recommendations:    Information / Referral to community resources:     Patient/Family's Response to care:  Patient's son expressed appreciation for CSW assistance.  Patient/Family's Understanding of and Emotional Response to Diagnosis, Current Treatment, and Prognosis:  Patient's  son are very involved with patient's care and treatment plan.  Emotional Assessment Appearance:  Appears stated age Attitude/Demeanor/Rapport:    Affect (typically observed):    Orientation:  Oriented to Self Alcohol / Substance use:  Not Applicable Psych involvement (Current and /or in the community):  No (Comment)  Discharge Needs  Concerns to be addressed:  Care Coordination Readmission within the last 30 days:  No Current discharge risk:  None Barriers to Discharge:  No Barriers Identified   Shela Leff, LCSW 08/14/2017, 11:20 AM

## 2017-08-14 NOTE — Progress Notes (Signed)
Inpatient Diabetes Program Recommendations  AACE/ADA: New Consensus Statement on Inpatient Glycemic Control (2015)  Target Ranges:  Prepandial:   less than 140 mg/dL      Peak postprandial:   less than 180 mg/dL (1-2 hours)      Critically ill patients:  140 - 180 mg/dL   Lab Results  Component Value Date   GLUCAP 101 (H) 08/14/2017   HGBA1C 5.3 02/20/2016    Review of Glycemic Control  Results for NORVELL, URESTE (MRN [id number]) as of 08/14/2017 12:43  Ref. Range 08/13/2017 11:49 08/13/2017 16:41 08/13/2017 21:08 08/14/2017 07:24 08/14/2017 11:46  Glucose-Capillary Latest Ref Range: 65 - 99 mg/dL 80 79 84 69 101 (H)    Diabetes history: Type 2 diabetes Outpatient Diabetes medications: none  Current orders for Inpatient glycemic control: Novolog 0-9 units tid, Novolog 0-5 units qhs  Inpatient Diabetes Program Recommendations: Consider stopping Novolog until CBG are consistently above 180mg /dl  Gentry Fitz, RN, BA, MHA, CDE Diabetes Coordinator Inpatient Diabetes Program  [phone] (Team Pager) [phone] ([place]) 08/14/2017 12:49 PM

## 2017-08-14 NOTE — Care Management Important Message (Signed)
Important Message  Patient Details  Name: Jill Howell MRN: [id number] Date of Birth: 10/21/1918   Medicare Important Message Given:  Yes    Beverly Sessions, RN 08/14/2017, 3:29 PM

## 2017-08-14 NOTE — NC FL2 (Signed)
[place] LEVEL OF CARE SCREENING TOOL     IDENTIFICATION  Patient Name: Jill Howell Birthdate: 1919/09/04 Sex: female Admission Date (Current Location): 08/12/2017  [place] and [place] Number:  Engineering geologist and Address:  [facility], [address] [place], [place] [address]      Provider Number: [id number]  Attending Physician Name and Address:  Bettey Costa, MD  Relative Name and Phone Number:       Current Level of Care: Hospital Recommended Level of Care: [place] Prior Approval Number:    Date Approved/Denied:   PASRR Number:    Discharge Plan: SNF    Current Diagnoses: Patient Active Problem List   Diagnosis Date Noted  . Rhabdomyolysis 08/12/2017  . Advance care planning 01/08/2017  . Hx of CABG 12/26/2016  . Cough 04/28/2016  . Hyperkalemia 04/22/2016  . Other fatigue 04/17/2016  . Pressure ulcer 02/20/2016  . Atherosclerosis of native coronary artery of native heart with stable angina pectoris ([place]) 02/19/2016  . Dementia 02/19/2016  . Hyponatremia 02/19/2016  . Chronic diastolic congestive heart failure ([place]) 02/19/2016  . Carotid stenosis 06/09/2014  . Asthmatic bronchitis , chronic ([place]) 03/24/2014  . Family discord 02/18/2014  . Aftercare following surgery of the circulatory system, [place] 12/07/2013  . Malaise and fatigue 12/01/2013  . Benign hypertensive heart disease without heart failure 08/17/2013  . At risk for falls 08/12/2013  . Anemia   . TIA (transient ischemic attack) 05/17/2013  . Occlusion and stenosis of carotid artery without mention of cerebral infarction 05/13/2013  . Fall at home 05/11/2013  . PAF (paroxysmal atrial fibrillation) ([place]) 08/11/2012  . Carotid artery disease ([place]) 08/19/2011  . Dermatitis 05/09/2011  . Hypothyroidism 09/15/2007  . History of diabetes mellitus, type II 09/15/2007  . Hyperlipidemia 09/15/2007  . HEARING IMPAIRMENT 09/15/2007  . Coronary  atherosclerosis 09/15/2007  . Congestive heart failure ([place]) 09/15/2007  . ALLERGIC RHINITIS 09/15/2007  . GERD 09/15/2007    Orientation RESPIRATION BLADDER Height & Weight     Self, Place  Normal Incontinent Weight: 130 lb (59 kg) Height:  5\' 4"  (162.6 cm)  BEHAVIORAL SYMPTOMS/MOOD NEUROLOGICAL BOWEL NUTRITION STATUS  (none) (none) Continent Diet(regular)  AMBULATORY STATUS COMMUNICATION OF NEEDS Skin   Extensive Assist Verbally Normal                       Personal Care Assistance Level of Assistance  Bathing, Feeding, Dressing Bathing Assistance: Limited assistance Feeding assistance: Limited assistance Dressing Assistance: Limited assistance     Functional Limitations Info  Hearing, Sight Sight Info: Impaired Hearing Info: Impaired      SPECIAL CARE FACTORS FREQUENCY  PT (By licensed PT)                    Contractures Contractures Info: Not present    Additional Factors Info  Code Status Code Status Info: full             Current Medications (08/14/2017):  This is the current hospital active medication list Current Facility-Administered Medications  Medication Dose Route Frequency Provider Last Rate Last Dose  . 0.9 %  sodium chloride infusion   Intravenous Continuous Lance Coon, MD 100 mL/hr at 08/14/17 0000    . atenolol (TENORMIN) tablet 25 mg  25 mg Oral Daily Gouru, Aruna, MD   25 mg at 08/14/17 0956  . digoxin (LANOXIN) tablet 125 mcg  125 mcg Oral Daily Gouru, Aruna,  MD   125 mcg at 08/14/17 0956  . Influenza vac split quadrivalent PF (FLUZONE HIGH-DOSE) injection 0.5 mL  0.5 mL Intramuscular Tomorrow-1000 Gouru, Aruna, MD      . insulin aspart (novoLOG) injection 0-5 Units  0-5 Units Subcutaneous QHS Gouru, Aruna, MD      . insulin aspart (novoLOG) injection 0-9 Units  0-9 Units Subcutaneous TID WC Gouru, Aruna, MD      . isosorbide mononitrate (IMDUR) 24 hr tablet 30 mg  30 mg Oral Daily Gouru, Aruna, MD   30 mg at 08/14/17 0956  .  levothyroxine (SYNTHROID, LEVOTHROID) tablet 100 mcg  100 mcg Oral QAC breakfast Gouru, Aruna, MD   100 mcg at 08/14/17 0749  . pantoprazole (PROTONIX) injection 40 mg  40 mg Intravenous Q12H Bettey Costa, MD   40 mg at 08/14/17 0955  . QUEtiapine (SEROQUEL) tablet 25 mg  25 mg Oral QHS Gouru, Illene Silver, MD   25 mg at 08/13/17 2212     Discharge Medications: Please see discharge summary for a list of discharge medications.  Relevant Imaging Results:  Relevant Lab Results:   Additional Information ss: [id number]  Shela Leff, LCSW

## 2017-08-14 NOTE — Clinical Social Work Note (Signed)
Patient has a bed offer from [facility] and patient's son has accepted. Jill Howell MSW,LCSW [phone]

## 2017-08-15 ENCOUNTER — Inpatient Hospital Stay: Payer: Medicare Other

## 2017-08-15 LAB — GLUCOSE, CAPILLARY
GLUCOSE-CAPILLARY: 106 mg/dL — AB (ref 65–99)
GLUCOSE-CAPILLARY: 140 mg/dL — AB (ref 65–99)
Glucose-Capillary: 110 mg/dL — ABNORMAL HIGH (ref 65–99)
Glucose-Capillary: 166 mg/dL — ABNORMAL HIGH (ref 65–99)

## 2017-08-15 MED ORDER — FUROSEMIDE 10 MG/ML IJ SOLN
20.0000 mg | Freq: Once | INTRAMUSCULAR | Status: AC
  Administered 2017-08-15: 20 mg via INTRAVENOUS

## 2017-08-15 MED ORDER — IPRATROPIUM-ALBUTEROL 0.5-2.5 (3) MG/3ML IN SOLN
3.0000 mL | RESPIRATORY_TRACT | Status: DC
  Administered 2017-08-15 – 2017-08-16 (×6): 3 mL via RESPIRATORY_TRACT
  Filled 2017-08-15 (×5): qty 3

## 2017-08-15 MED ORDER — IPRATROPIUM-ALBUTEROL 0.5-2.5 (3) MG/3ML IN SOLN
RESPIRATORY_TRACT | Status: AC
  Filled 2017-08-15: qty 3

## 2017-08-15 MED ORDER — FUROSEMIDE 10 MG/ML IJ SOLN
INTRAMUSCULAR | Status: AC
  Filled 2017-08-15: qty 4

## 2017-08-15 MED ORDER — SODIUM CHLORIDE 0.9 % IV SOLN
INTRAVENOUS | Status: DC
  Administered 2017-08-15: 01:00:00 via INTRAVENOUS

## 2017-08-15 MED ORDER — TAMSULOSIN HCL 0.4 MG PO CAPS
0.4000 mg | ORAL_CAPSULE | Freq: Every day | ORAL | Status: DC
  Administered 2017-08-15 – 2017-08-16 (×2): 0.4 mg via ORAL
  Filled 2017-08-15 (×2): qty 1

## 2017-08-15 NOTE — Progress Notes (Signed)
[name]    MR#:  [id number]  DATE OF BIRTH:  17-Dec-1918  SUBJECTIVE:   Patient had shortness of breath this morning. Chest x-ray shows possible pneumonia 20 mg IV Lasix given Patient feeling a bit better  REVIEW OF SYSTEMS:   Patient with dementia Was complaining shortness of breath Other pertinent review systems due to dementia unobtainable   Tolerating Diet: yes      DRUG ALLERGIES:   Allergies  Allergen Reactions  . Furosemide Other (See Comments)    Patient prefers not to take med, weakness and frequent urination  . Penicillins Other (See Comments)    unknown    VITALS:  Blood pressure (!) 152/60, pulse 74, temperature 97.7 F (36.5 C), temperature source Oral, resp. rate (!) 29, height 5\' 4"  (1.626 m), weight 59 kg (130 lb), SpO2 94 %.  PHYSICAL EXAMINATION:  Constitutional: Appears well-developed and well-nourished. Sitting upright in bed with some mild  acute distress  HENT: Normocephalic. [name] Oropharynx is clear and moist.  Eyes: Conjunctivae and EOM are normal. PERRLA, no scleral icterus.  Neck: Normal ROM. Neck supple. No JVD. No tracheal deviation. CVS: RRR, S1/S2 +, no murmurs, no gallops, no carotid bruit.  Pulmonary: Increased effort with bilateral crackles at bases no wheezing  Abdominal: Soft. BS +,  no distension, tenderness, rebound or guarding.  Musculoskeletal: Normal range of motion. No edema and no tenderness.  Neuro: Alert. CN 2-12 grossly intact. No focal deficits. Oriented to nameIn place not time  Skin: Skin is warm and dry. No rash noted. Psychiatric: Dementia     LABORATORY PANEL:   CBC Recent Labs  Lab 08/14/17 0418  WBC 9.4  HGB 11.0*  HCT 32.2*  PLT 134*   ------------------------------------------------------------------------------------------------------------------  Chemistries  Recent Labs  Lab 08/13/17 0132 08/14/17 0418  NA 134* 139  K  3.5 3.3*  CL 103 109  CO2 21* 20*  GLUCOSE 111* 80  BUN 29* 23*  CREATININE 1.25* 1.03*  CALCIUM 8.1* 8.1*  AST 136*  --   ALT 35  --   ALKPHOS 53  --   BILITOT 1.2  --    ------------------------------------------------------------------------------------------------------------------  Cardiac Enzymes Recent Labs  Lab 08/12/17 2032 08/13/17 0132 08/13/17 0730  TROPONINI 0.06* 0.06* 0.06*   ------------------------------------------------------------------------------------------------------------------  RADIOLOGY:  Dg Chest 1 View  Result Date: 08/15/2017 CLINICAL DATA:  Acute respiratory distress EXAM: CHEST 1 VIEW COMPARISON:  04/22/2016 FINDINGS: Chronic lung disease with hyperinflation and interstitial coarsening. Chronic cardiomegaly. Status post CABG. Mitral annular calcification. Hazy asymmetric right perihilar density. IMPRESSION: 1. Hazy right perihilar density concerning for pneumonia in the appropriate clinical setting. 2. Background chronic lung disease. 3. Chronic cardiomegaly. Electronically Signed   By: Monte Fantasia M.D.   On: 08/15/2017 08:19     ASSESSMENT AND PLAN:  83 year old female with dementia who presents after a fall   1. Shortness of breath: Patient given 20 millions IV Lasix with improvement in symptoms  I am not totally convinced patient has pneumonia as she has not had elevated white blood cell count or fever. Recheck x-ray in a.m.  Speech consultation to evaluate for underlying aspiration  Stop IV fluids  2. Rhabdomylysis after fall CK has improved with IV fluids Repeat in a.m. since IV fluids stopped  3. Guaiac positive stool found by ER MD: Hemoglobin has Howell stable. There is no evidence of GI bleed. She was evaluated by GI consultant. At this  time given her age, stable hemoglobin and stable hemodynamics there are no recommendations for invasive workup. Family is in agreement with this   4 Fall: PT consultation has recommended  skilled nursing facility upon discharge. 5. Dementia: Appears to be at baseline  6. Elevated troponin in the setting of her abdomen lysis. Patient has Howell ruled out for ACS   7 Essential hypertension: Continue atenolol and isosorbide  8. Hypothyroid: Continue Synthroid  9. PAF: Continue digoxin and atenolol  10. acute on Chronic diastolic heart failure: Patient was receiving IV fluids for rhabdomyolysis. IV fluids now stop. 20 mg IV Lasix given.  BMP order for a.m.  11. Diabetes: It appears to be diet controlled  12. Urinary retention: Start Flomax and continue to monitor for need for in and out catheter.     Management plans discussed with nursing   CODE STATUS: Full  TOTAL TIME TAKING CARE OF THIS PATIENT: 24 minutes.     POSSIBLE D/C tomorrow to SNF DEPENDING ON CLINICAL CONDITION.   Bruce Mayers M.D on 08/15/2017 at 9:38 AM  Between 7am to 6pm - Pager - [phone] After 6pm go to [email or web address] - password [email or web address] [place]  Office  [phone]  CC: Primary care physician; Tonia Ghent, MD  Note: This dictation was prepared with Dragon dictation along with smaller phrase technology. Any transcriptional errors that result from this process are unintentional.

## 2017-08-15 NOTE — Consult Note (Signed)
   [facility] Inpatient Consult   08/15/2017  Catilyn Boggus Amirault [birth year] [phone]   Patient screened for potential [place] Management services. Patient is on the [facility] registry as a benefit of their Next Gen medicare . Electronic medical record reveals patient's discharge plan is SNF. [facility] Care Management services not appropriate at this time. If patient's post hospital needs change please place a [facility] Care Management consult. For questions please contact:   Amitai Delaughter RN, [facility] Liaison  [phone]) Business Mobile [phone]) Toll free office

## 2017-08-15 NOTE — Progress Notes (Signed)
I was called into pt's room by NT to tell me pt stated that she felt as though she could not breath. Upon assessment she was wheezing and coughing. Her breathing was labored and RR was 29. We paged Dr. Benjie Karvonen and she gave a verbal order for a nebulizer treatment and O2 for her 94 O2 sats. She also ordered Chest xray. When Dr. Benjie Karvonen arrived she ordered Lasix so when administered that and her breathing over time improved. I will monitor her throughout the day.

## 2017-08-15 NOTE — Progress Notes (Signed)
Bladder appears distended. Pt has voided x2 this shift in brief, but only very small amounts. Pt is on NS @ 166ml/hr continuous. Bladder scan done @ 0500 showed 638ml. Dr Estanislado Pandy notified. In and out cath ordered and completed, 763ml out. This is the 3rd in and out cath performed on pt in 24 hours.

## 2017-08-15 NOTE — Progress Notes (Signed)
Physical Therapy Treatment Patient Details Name: Jill Howell MRN: [id number] DOB: [birth year] Today's Date: 08/15/2017    History of Present Illness Pt admitted for rhabdomyolysis.  PMH includes dementia, CAD, atrial fibrillation, Htn, hyperlipidemia, DM, allergic rhinitis, anemia, CHF and TIA.    PT Comments    Pt presents with mild deficits in strength, transfers, mobility, gait, balance, and activity tolerance.  Pt able to perform all bed mobility tasks without physical assistance but did require significant effort and time to complete them.  Pt steady during transfers to/from sitting without assistance required.  Pt able to amb 200' with RW and SBA with very slow cadence and min cues for amb closer to RW but was steady without LOB with SpO2 and HR WNL throughout.  Pt reported min SOB after amb but no other adverse symptoms.  Pt is appropriate for discharge home with HHPT to address above deficits but would benefit from 24/hr supervision for general safety.    Follow Up Recommendations  Home health PT;Supervision/Assistance - 24 hour     Equipment Recommendations  Other (comment);Rolling walker with 5" wheels(Pt would benefit from RW, unsure if pt has one at this time)    Recommendations for Other Services       Precautions / Restrictions Precautions Precautions: Fall Restrictions Weight Bearing Restrictions: No    Mobility  Bed Mobility Overal bed mobility: Needs Assistance Bed Mobility: Supine to Sit;Sit to Supine     Supine to sit: Modified independent (Device/Increase time) Sit to supine: Modified independent (Device/Increase time)   General bed mobility comments: Extra time and effort required for bed mobility tasks but no physical assistance needed  Transfers Overall transfer level: Needs assistance Equipment used: Rolling walker (2 wheeled) Transfers: Sit to/from Stand Sit to Stand: Supervision         General transfer comment: Pt steady during sit to/from  stand transfer training with min verbal cues for proper hand placement  Ambulation/Gait Ambulation/Gait assistance: Supervision Ambulation Distance (Feet): 200 Feet Assistive device: Rolling walker (2 wheeled) Gait Pattern/deviations: Decreased step length - right;Decreased step length - left;Step-through pattern;Trunk flexed   Gait velocity interpretation: Below normal speed for age/gender General Gait Details: Decreased B step length and slow cadence with gait but steady without LOB   Stairs            Wheelchair Mobility    Modified Rankin (Stroke Patients Only)       Balance Overall balance assessment: Needs assistance Sitting-balance support: Feet unsupported;Feet supported;No upper extremity supported Sitting balance-Leahy Scale: Good     Standing balance support: Bilateral upper extremity supported;During functional activity Standing balance-Leahy Scale: Good                              Cognition Arousal/Alertness: Awake/alert Behavior During Therapy: WFL for tasks assessed/performed Overall Cognitive Status: Within Functional Limits for tasks assessed                                        Exercises Total Joint Exercises Ankle Circles/Pumps: AROM;Both;10 reps Quad Sets: Strengthening;Both;10 reps Gluteal Sets: Strengthening;Both;10 reps Heel Slides: AROM;Both;10 reps Hip ABduction/ADduction: AROM;Both;10 reps Straight Leg Raises: AROM;Both;10 reps Long Arc Quad: AROM;Both;15 reps Knee Flexion: AROM;Both;15 reps Marching in Standing: AROM;Both;10 reps    General Comments        Pertinent Vitals/Pain Pain Assessment: No/denies pain  Home Living                      Prior Function            PT Goals (current goals can now be found in the care plan section) Progress towards PT goals: Progressing toward goals    Frequency    Min 2X/week      PT Plan Discharge plan needs to be updated     Co-evaluation              AM-PAC PT "6 Clicks" Daily Activity  Outcome Measure                   End of Session Equipment Utilized During Treatment: Gait belt Activity Tolerance: Patient tolerated treatment well Patient left: in bed;with bed alarm set;with call bell/phone within reach(Pt declined up in chair) Nurse Communication: Mobility status PT Visit Diagnosis: Repeated falls (R29.6);Muscle weakness (generalized) (M62.81);Unsteadiness on feet (R26.81)     Time: 7124-5809 PT Time Calculation (min) (ACUTE ONLY): 25 min  Charges:  $Gait Training: 8-22 mins $Therapeutic Exercise: 8-22 mins                    G Codes:       DRoyetta Asal PT, DPT 08/15/17, 12:02 PM

## 2017-08-15 NOTE — Evaluation (Signed)
Clinical/Bedside Swallow Evaluation Patient Details  Name: Jill Howell MRN: [id number] Date of Birth: Sep 03, 1919  Today's Date: 08/15/2017 Time: SLP Start Time (ACUTE ONLY): 46 SLP Stop Time (ACUTE ONLY): 1050 SLP Time Calculation (min) (ACUTE ONLY): 30 min  Past Medical History:  Past Medical History:  Diagnosis Date  . Allergic rhinitis, cause unspecified   . Anemia   . Asthma   . Atrial fibrillation ([place])    a. remote->no anticoagulation 2/2 h/o falls.  [name] CAD (coronary artery disease)    a. 2000 s/p MI/CABG x 4 (VG-PDA, VG-OM, VG-Diag, LIMA-LAD);  b. 2011 MI;  c. 08/2013 NSTEMI/Cath: LM 90-95, LAD 90/100p, LCX 99p, 162m, RCA 100ost, VG-PDA nl, VG-OM 100 (culprit w/ thrombus), VG-Diag nl, LIMA-LAD nl, EF 55-60%, mild inf HK, mod MR-->Med Rx.  . Carotid arterial disease ([place])    a. 05/2013 s/p L CEA.  . Chronic diastolic CHF (congestive heart failure) ([place])    a. 08/2013 Echo: EF 50-55%, no rwma, Gr2 DD, mod MR, mildly dil LA, PASP 29mmHg.  [name] Esophageal reflux   . History of TIA (transient ischemic attack)   . Hyperlipidemia   . Hypertension   . Hypothyroidism   . Myocardial infarction ([name])    08-2013  . Type II or unspecified type diabetes mellitus without mention of complication, not stated as uncontrolled   . Unspecified hearing loss    Past Surgical History:  Past Surgical History:  Procedure Laterality Date  . ABDOMINAL HYSTERECTOMY    . Bladder tack     x 2  . CARDIAC CATHETERIZATION  09/07/1999   EF 25%. THERE IS SEVERE MITRAL ANNULAR CALIFICATION, WITH  3 + INSUFFICIENCY  . CARDIOVASCULAR STRESS TEST  06/30/2007   EF 63%, NO ISCHEMIA  . CAROTID ENDARTERECTOMY Left 05-19-13   cea  . CHOLECYSTECTOMY  10/2001  . CORONARY ARTERY BYPASS GRAFT  2000   4 vessel  . DILATION AND CURETTAGE OF UTERUS    . ENDARTERECTOMY Left 05/19/2013   Procedure: ENDARTERECTOMY CAROTID-LEFT;  Surgeon: Elam Dutch, MD;  Location: [facility];  Service: Vascular;  Laterality: Left;  . EYE  SURGERY Bilateral    Cataract  . HEMORRHOID SURGERY    . LEFT HEART CATH  Dec. 2, 2014   With  Coronary Graft  Angiogram  . LEFT HEART CATHETERIZATION WITH CORONARY/GRAFT ANGIOGRAM  08/10/2013   Procedure: LEFT HEART CATHETERIZATION WITH Beatrix Fetters;  Surgeon: Peter M Martinique, MD;  Location: [facility];  Service: Cardiovascular;;  . PARTIAL HYSTERECTOMY    . RECTOCELE REPAIR     x 2  . ROTATOR CUFF REPAIR    . TONSILLECTOMY    . US ECHOCARDIOGRAPHY  01/17/2005   EF 55-60%   HPI:  Pt admitted for rhabdomyolysis.  PMH includes dementia, CAD, atrial fibrillation, Htn, hyperlipidemia, DM, allergic rhinitis, anemia, CHF and TIA.  CXR (+) Chronic lung disease with hyperinflation and interstitial coarsening. Chronic cardiomegaly. Status post CABG. Mitral annular calcification. Hazy asymmetric right perihilar density. Patient had shortness of breath this morning (12/7). Chest x-ray shows possible pneumonia.  Pt currently on a regular diet with unrestricted liquids.  She denies swallowing difficulty, however does report hx of recurrent PNA.  Unable to confirm such via chart review.  Prior to admission, pt lived home alone.  She denies ever being on thickened liquids or a modified diet.   Assessment / Plan / Recommendation Clinical Impression  Pt presents with suspected moderate oropharyngeal dysphagia characterized by significant coughing with both thin  liquid and nectar thick liquid trials.  Pt with open mouth posture at rest.  She overtly tolerated honey thick, puree, and hard solid trials without coughing, throat clearing, or wet vocal quality noted.  Cough is strong with wheezing noted.  Oral mechanism exam revealed xerostomia and bilateral oral-motor weakness.  Voice is breathy with low volume.  Other contributing factors include pt reported history of recurrent PNA, compromised respiratory status, and findings of most recent CXR - Hazy asymmetric right perihilar density. Pt is at moderate  risk for aspiration and would benefit from instrumental study to determine least restrictive diet. Recommend regular diet with honey thick liquids and adherence to aspiration precautions.  Small bites/sips with external cues to for pacing with breaks - particularly with mastication of solids.  Oral care BID, upright posture maintained, medications crushed (if possible) in puree, otherwise whole in puree.   SLP Visit Diagnosis: Dysphagia, oropharyngeal phase (R13.12)    Aspiration Risk  Moderate aspiration risk;Mild aspiration risk    Diet Recommendation Regular;Honey-thick liquid   Liquid Administration via: Cup;Spoon Medication Administration: Crushed with puree(Crushed if possible, otherwise whole in puree) Supervision: Patient able to self feed(Requires some assistance to bring to mouth d/t weakness) Compensations: Slow rate;Small sips/bites;Effortful swallow Postural Changes: Seated upright at 90 degrees    Other  Recommendations Oral Care Recommendations: Oral care BID;Oral care prior to ice chip/H20   Follow up Recommendations Skilled Nursing facility      Frequency and Duration min 2x/week  Other (Comment)(During acute stay)       Prognosis Prognosis for Safe Diet Advancement: Fair Barriers to Reach Goals: Other (Comment)(Advanced age)      Ricka Burdock Study   General Date of Onset: 08/15/17 HPI: Pt admitted for rhabdomyolysis.  PMH includes dementia, CAD, atrial fibrillation, Htn, hyperlipidemia, DM, allergic rhinitis, anemia, CHF and TIA.  CXR (+) Chronic lung disease with hyperinflation and interstitial coarsening. Chronic cardiomegaly. Status post CABG. Mitral annular calcification. Hazy asymmetric right perihilar density. Patient had shortness of breath this morning (12/7). Chest x-ray shows possible pneumonia.  Pt currently on a regular diet with unrestricted liquids.  She denies swallowing difficulty, however does report hx of recurrent PNA.  Unable to confirm such via chart  review.  Prior to admission, pt lived home alone.  She denies ever being on thickened liquids or a modified diet. Type of Study: Bedside Swallow Evaluation Diet Prior to this Study: Regular;Thin liquids Respiratory Status: Room air History of Recent Intubation: No Behavior/Cognition: Alert;Cooperative Oral Cavity Assessment: Dry Oral Cavity - Dentition: Adequate natural dentition Vision: Functional for self-feeding Self-Feeding Abilities: Needs assist;Needs set up Patient Positioning: Upright in bed Baseline Vocal Quality: Breathy;Low vocal intensity Volitional Cough: Strong Volitional Swallow: Able to elicit    Oral/Motor/Sensory Function Overall Oral Motor/Sensory Function: Mild impairment Facial ROM: Within Functional Limits Facial Symmetry: Within Functional Limits Facial Strength: Reduced left;Reduced right Facial Sensation: Within Functional Limits Lingual ROM: Within Functional Limits Lingual Symmetry: Within Functional Limits Lingual Strength: Reduced Lingual Sensation: Within Functional Limits Velum: Within Functional Limits Mandible: Within Functional Limits   Ice Chips Ice chips: Not tested   Thin Liquid Thin Liquid: Impaired Presentation: Self Fed;Cup Pharyngeal  Phase Impairments: Throat Clearing - Delayed;Cough - Delayed    Nectar Thick Nectar Thick Liquid: Impaired Presentation: Self Fed;Cup Pharyngeal Phase Impairments: Cough - Immediate   Honey Thick Honey Thick Liquid: Within functional limits   Puree Puree: Within functional limits   Solid   GO   Solid: Within functional limits  Clare Fennimore 08/15/2017,11:28 AM

## 2017-08-16 ENCOUNTER — Inpatient Hospital Stay: Payer: Medicare Other

## 2017-08-16 DIAGNOSIS — I5033 Acute on chronic diastolic (congestive) heart failure: Secondary | ICD-10-CM | POA: Diagnosis present

## 2017-08-16 DIAGNOSIS — R0602 Shortness of breath: Secondary | ICD-10-CM | POA: Diagnosis not present

## 2017-08-16 DIAGNOSIS — Z515 Encounter for palliative care: Secondary | ICD-10-CM | POA: Diagnosis not present

## 2017-08-16 DIAGNOSIS — M6282 Rhabdomyolysis: Secondary | ICD-10-CM | POA: Diagnosis not present

## 2017-08-16 DIAGNOSIS — I252 Old myocardial infarction: Secondary | ICD-10-CM | POA: Diagnosis not present

## 2017-08-16 DIAGNOSIS — K219 Gastro-esophageal reflux disease without esophagitis: Secondary | ICD-10-CM | POA: Diagnosis present

## 2017-08-16 DIAGNOSIS — I509 Heart failure, unspecified: Secondary | ICD-10-CM | POA: Diagnosis not present

## 2017-08-16 DIAGNOSIS — Z9049 Acquired absence of other specified parts of digestive tract: Secondary | ICD-10-CM | POA: Diagnosis not present

## 2017-08-16 DIAGNOSIS — R296 Repeated falls: Secondary | ICD-10-CM | POA: Diagnosis not present

## 2017-08-16 DIAGNOSIS — Z888 Allergy status to other drugs, medicaments and biological substances status: Secondary | ICD-10-CM | POA: Diagnosis not present

## 2017-08-16 DIAGNOSIS — F0390 Unspecified dementia without behavioral disturbance: Secondary | ICD-10-CM | POA: Diagnosis not present

## 2017-08-16 DIAGNOSIS — E538 Deficiency of other specified B group vitamins: Secondary | ICD-10-CM | POA: Diagnosis not present

## 2017-08-16 DIAGNOSIS — Z9189 Other specified personal risk factors, not elsewhere classified: Secondary | ICD-10-CM | POA: Diagnosis not present

## 2017-08-16 DIAGNOSIS — I161 Hypertensive emergency: Secondary | ICD-10-CM | POA: Diagnosis present

## 2017-08-16 DIAGNOSIS — F0280 Dementia in other diseases classified elsewhere without behavioral disturbance: Secondary | ICD-10-CM | POA: Diagnosis not present

## 2017-08-16 DIAGNOSIS — Z7189 Other specified counseling: Secondary | ICD-10-CM | POA: Diagnosis not present

## 2017-08-16 DIAGNOSIS — I5032 Chronic diastolic (congestive) heart failure: Secondary | ICD-10-CM | POA: Diagnosis not present

## 2017-08-16 DIAGNOSIS — I2510 Atherosclerotic heart disease of native coronary artery without angina pectoris: Secondary | ICD-10-CM | POA: Diagnosis present

## 2017-08-16 DIAGNOSIS — T796XXD Traumatic ischemia of muscle, subsequent encounter: Secondary | ICD-10-CM | POA: Diagnosis not present

## 2017-08-16 DIAGNOSIS — Y95 Nosocomial condition: Secondary | ICD-10-CM | POA: Diagnosis present

## 2017-08-16 DIAGNOSIS — Z66 Do not resuscitate: Secondary | ICD-10-CM | POA: Diagnosis not present

## 2017-08-16 DIAGNOSIS — J189 Pneumonia, unspecified organism: Secondary | ICD-10-CM | POA: Diagnosis not present

## 2017-08-16 DIAGNOSIS — Z880 Allergy status to penicillin: Secondary | ICD-10-CM | POA: Diagnosis not present

## 2017-08-16 DIAGNOSIS — R6889 Other general symptoms and signs: Secondary | ICD-10-CM | POA: Diagnosis not present

## 2017-08-16 DIAGNOSIS — E039 Hypothyroidism, unspecified: Secondary | ICD-10-CM | POA: Diagnosis present

## 2017-08-16 DIAGNOSIS — I110 Hypertensive heart disease with heart failure: Secondary | ICD-10-CM | POA: Diagnosis present

## 2017-08-16 DIAGNOSIS — R195 Other fecal abnormalities: Secondary | ICD-10-CM | POA: Diagnosis not present

## 2017-08-16 DIAGNOSIS — E785 Hyperlipidemia, unspecified: Secondary | ICD-10-CM | POA: Diagnosis present

## 2017-08-16 DIAGNOSIS — J9601 Acute respiratory failure with hypoxia: Secondary | ICD-10-CM | POA: Diagnosis not present

## 2017-08-16 DIAGNOSIS — T796XXA Traumatic ischemia of muscle, initial encounter: Secondary | ICD-10-CM | POA: Diagnosis not present

## 2017-08-16 DIAGNOSIS — I10 Essential (primary) hypertension: Secondary | ICD-10-CM | POA: Diagnosis not present

## 2017-08-16 DIAGNOSIS — H9190 Unspecified hearing loss, unspecified ear: Secondary | ICD-10-CM | POA: Diagnosis present

## 2017-08-16 DIAGNOSIS — I480 Paroxysmal atrial fibrillation: Secondary | ICD-10-CM | POA: Diagnosis not present

## 2017-08-16 DIAGNOSIS — J45909 Unspecified asthma, uncomplicated: Secondary | ICD-10-CM | POA: Diagnosis not present

## 2017-08-16 DIAGNOSIS — Z23 Encounter for immunization: Secondary | ICD-10-CM | POA: Diagnosis not present

## 2017-08-16 DIAGNOSIS — I5031 Acute diastolic (congestive) heart failure: Secondary | ICD-10-CM | POA: Diagnosis not present

## 2017-08-16 DIAGNOSIS — Z951 Presence of aortocoronary bypass graft: Secondary | ICD-10-CM | POA: Diagnosis not present

## 2017-08-16 DIAGNOSIS — W19XXXA Unspecified fall, initial encounter: Secondary | ICD-10-CM | POA: Diagnosis not present

## 2017-08-16 DIAGNOSIS — Z8249 Family history of ischemic heart disease and other diseases of the circulatory system: Secondary | ICD-10-CM | POA: Diagnosis not present

## 2017-08-16 DIAGNOSIS — Z820 Family history of epilepsy and other diseases of the nervous system: Secondary | ICD-10-CM | POA: Diagnosis not present

## 2017-08-16 DIAGNOSIS — I248 Other forms of acute ischemic heart disease: Secondary | ICD-10-CM | POA: Diagnosis present

## 2017-08-16 DIAGNOSIS — E0590 Thyrotoxicosis, unspecified without thyrotoxic crisis or storm: Secondary | ICD-10-CM | POA: Diagnosis present

## 2017-08-16 DIAGNOSIS — E440 Moderate protein-calorie malnutrition: Secondary | ICD-10-CM | POA: Diagnosis present

## 2017-08-16 DIAGNOSIS — Z8673 Personal history of transient ischemic attack (TIA), and cerebral infarction without residual deficits: Secondary | ICD-10-CM | POA: Diagnosis not present

## 2017-08-16 DIAGNOSIS — Z7982 Long term (current) use of aspirin: Secondary | ICD-10-CM | POA: Diagnosis not present

## 2017-08-16 DIAGNOSIS — A419 Sepsis, unspecified organism: Secondary | ICD-10-CM | POA: Diagnosis present

## 2017-08-16 DIAGNOSIS — W19XXXD Unspecified fall, subsequent encounter: Secondary | ICD-10-CM | POA: Diagnosis not present

## 2017-08-16 DIAGNOSIS — E119 Type 2 diabetes mellitus without complications: Secondary | ICD-10-CM | POA: Diagnosis present

## 2017-08-16 DIAGNOSIS — G3184 Mild cognitive impairment, so stated: Secondary | ICD-10-CM | POA: Diagnosis not present

## 2017-08-16 LAB — BASIC METABOLIC PANEL
ANION GAP: 11 (ref 5–15)
BUN: 16 mg/dL (ref 6–20)
CO2: 23 mmol/L (ref 22–32)
Calcium: 8.2 mg/dL — ABNORMAL LOW (ref 8.9–10.3)
Chloride: 106 mmol/L (ref 101–111)
Creatinine, Ser: 0.89 mg/dL (ref 0.44–1.00)
GFR, EST NON AFRICAN AMERICAN: 53 mL/min — AB (ref >60)
Glucose, Bld: 106 mg/dL — ABNORMAL HIGH (ref 65–99)
POTASSIUM: 3.2 mmol/L — AB (ref 3.5–5.1)
SODIUM: 140 mmol/L (ref 135–145)

## 2017-08-16 LAB — CBC
HEMATOCRIT: 30.2 % — AB (ref 35.0–47.0)
Hemoglobin: 10.5 g/dL — ABNORMAL LOW (ref 12.0–16.0)
MCH: 30.3 pg (ref 26.0–34.0)
MCHC: 34.6 g/dL (ref 32.0–36.0)
MCV: 87.5 fL (ref 80.0–100.0)
Platelets: 147 10*3/uL — ABNORMAL LOW (ref 150–440)
RBC: 3.46 MIL/uL — AB (ref 3.80–5.20)
RDW: 14.5 % (ref 11.5–14.5)
WBC: 8.8 10*3/uL (ref 3.6–11.0)

## 2017-08-16 LAB — GLUCOSE, CAPILLARY
GLUCOSE-CAPILLARY: 104 mg/dL — AB (ref 65–99)
GLUCOSE-CAPILLARY: 112 mg/dL — AB (ref 65–99)
GLUCOSE-CAPILLARY: 125 mg/dL — AB (ref 65–99)

## 2017-08-16 LAB — CK: Total CK: 442 U/L — ABNORMAL HIGH (ref 38–234)

## 2017-08-16 MED ORDER — BISACODYL 10 MG RE SUPP
10.0000 mg | Freq: Once | RECTAL | Status: AC
  Administered 2017-08-16: 10 mg via RECTAL
  Filled 2017-08-16: qty 1

## 2017-08-16 MED ORDER — IPRATROPIUM-ALBUTEROL 0.5-2.5 (3) MG/3ML IN SOLN: 3.0000 mL | RESPIRATORY_TRACT | Status: DC | PRN

## 2017-08-16 NOTE — Progress Notes (Signed)
Called report to [place] at [facility]. Questions were encouraged. Called for tranport by [facility].

## 2017-08-16 NOTE — Discharge Summary (Signed)
[facility] NAME: Jill Howell    MR#:  [id number]  DATE OF BIRTH:  March 29, 1919  DATE OF ADMISSION:  08/12/2017 ADMITTING PHYSICIAN: Nicholes Mango, MD  DATE OF DISCHARGE: 08/16/2017  PRIMARY CARE PHYSICIAN: Tonia Ghent, MD    ADMISSION DIAGNOSIS:  Fall, initial encounter [phone]] Traumatic rhabdomyolysis, initial encounter ([place]) [T79.6XXA] Gastrointestinal hemorrhage, unspecified gastrointestinal hemorrhage type [K92.2]  DISCHARGE DIAGNOSIS:  Acute rhabdomyolysis status post fall Acute on chronic diastolic heart failure secondary to IV fluids improved  SECONDARY DIAGNOSIS:   Past Medical History:  Diagnosis Date  . Allergic rhinitis, cause unspecified   . Anemia   . Asthma   . Atrial fibrillation ([place])    a. remote->no anticoagulation 2/2 h/o falls.  [name] CAD (coronary artery disease)    a. 2000 s/p MI/CABG x 4 (VG-PDA, VG-OM, VG-Diag, LIMA-LAD);  b. 2011 MI;  c. 08/2013 NSTEMI/Cath: LM 90-95, LAD 90/100p, LCX 99p, 175m, RCA 100ost, VG-PDA nl, VG-OM 100 (culprit w/ thrombus), VG-Diag nl, LIMA-LAD nl, EF 55-60%, mild inf HK, mod MR-->Med Rx.  . Carotid arterial disease ([place])    a. 05/2013 s/p L CEA.  . Chronic diastolic CHF (congestive heart failure) ([place])    a. 08/2013 Echo: EF 50-55%, no rwma, Gr2 DD, mod MR, mildly dil LA, PASP 67mmHg.  [name] Esophageal reflux   . History of TIA (transient ischemic attack)   . Hyperlipidemia   . Hypertension   . Hypothyroidism   . Myocardial infarction ([place])    08-2013  . Type II or unspecified type diabetes mellitus without mention of complication, not stated as uncontrolled   . Unspecified hearing loss     HOSPITAL COURSE:   83 year old female with dementia who presents after a fall   1. acute Rhabdomylysis after fall CK HAS IMPROVED WITH IV fluids She is encouraged to drink fluids. Physical therapy has recommended skilled nursing facility upon discharge -We will resume  statins now that rhabdomyolysis is improved  2. Guaiac positive stool: Hemoglobin has been stable. There is no evidence of GI bleed. She was evaluated by GI consultant. At this time given her age, stable hemoglobin and stable hemodynamics there are no recommendations for invasive workup. Family is in agreement with this   3. Fall: PT consultation has recommended skilled nursing facility upon discharge. She will discharged to [facility].  4. Dementia: Appears to be at baseline  5. Elevated troponin in the setting of her abdomen lysis. Patient has been ruled out for ACS DC telemetry  6. Essential hypertension: Continue atenolol and isosorbide  7. Hypothyroid: Continue Synthroid  8. PAF: Continue digoxin and atenolol  9.  Acute on chronic diastolic heart failure --patient had mild exacerbation given IV fluids received for rhabdo myosis.  Patient received a dose of Lasix.  Sats are 96% on room air.  Repeat chest x-ray shows improvement in pulmonary vascular congestion  10. Diabetes: It appears to be diet controlled  Patient overall at baseline.  Discussed with son Jill Howell.  Patient will discharge to [facility] DIET:   Stable for discharge on diabetic heart healthy diet   CONSULTS OBTAINED:    DRUG ALLERGIES:   Allergies  Allergen Reactions  . Furosemide Other (See Comments)    Patient prefers not to take med, weakness and frequent urination  . Penicillins Other (See Comments)    unknown    DISCHARGE MEDICATIONS:     If you experience worsening  of your admission symptoms, develop shortness of breath, life threatening emergency, suicidal or homicidal thoughts you must seek medical attention immediately by calling 911 or calling your MD immediately  if symptoms less severe.  You Must read complete instructions/literature along with all the possible adverse reactions/side effects for all the Medicines you take and that have been  prescribed to you. Take any new Medicines after you have completely understood and accept all the possible adverse reactions/side effects.   Please note  You were cared for by a hospitalist during your hospital stay. If you have any questions about your discharge medications or the care you received while you were in the hospital after you are discharged, you can call the unit and asked to speak with the hospitalist on call if the hospitalist that took care of you is not available. Once you are discharged, your primary care physician will handle any further medical issues. Please note that NO REFILLS for any discharge medications will be authorized once you are discharged, as it is imperative that you return to your primary care physician (or establish a relationship with a primary care physician if you do not have one) for your aftercare needs so that they can reassess your need for medications and monitor your lab values. Today   SUBJECTIVE   Overall doing well.  VITAL SIGNS:  Blood pressure (!) 159/59, pulse 86, temperature 98 F (36.7 C), temperature source Oral, resp. rate 18, height 5\' 4"  (1.626 m), weight 59 kg (130 lb), SpO2 96 %.  I/O:    Intake/Output Summary (Last 24 hours) at 08/16/2017 1107 Last data filed at 08/16/2017 2025 Gross per 24 hour  Intake -  Output 450 ml  Net -450 ml    PHYSICAL EXAMINATION:  GENERAL:  83 y.o.-year-old patient lying in the bed with no acute distress.  EYES: Pupils equal, round, reactive to light and accommodation. No scleral icterus. Extraocular muscles intact.  HEENT: Head atraumatic, normocephalic. Oropharynx and nasopharynx clear.  NECK:  Supple, no jugular venous distention. No thyroid enlargement, no tenderness.  LUNGS: Normal breath sounds bilaterally, no wheezing, rales,rhonchi or crepitation. No use of accessory muscles of respiration.  CARDIOVASCULAR: S1, S2 normal. No murmurs, rubs, or gallops.  ABDOMEN: Soft, non-tender,  non-distended. Bowel sounds present. No organomegaly or mass.  EXTREMITIES: No pedal edema, cyanosis, or clubbing.  NEUROLOGIC: Cranial nerves II through XII are intact. Muscle strength 5/5 in all extremities. Sensation intact. Gait not checked.  PSYCHIATRIC: The patient is alert and oriented x 3.  SKIN: No obvious rash, lesion, or ulcer.   DATA REVIEW:   CBC  Recent Labs  Lab 08/16/17 0337  WBC 8.8  HGB 10.5*  HCT 30.2*  PLT 147*    Chemistries  Recent Labs  Lab 08/13/17 0132  08/16/17 0337  NA 134*   < > 140  K 3.5   < > 3.2*  CL 103   < > 106  CO2 21*   < > 23  GLUCOSE 111*   < > 106*  BUN 29*   < > 16  CREATININE 1.25*   < > 0.89  CALCIUM 8.1*   < > 8.2*  AST 136*  --   --   ALT 35  --   --   ALKPHOS 53  --   --   BILITOT 1.2  --   --    < > = values in this interval not displayed.    Microbiology Results   No results  found for this or any previous visit (from the past 240 hour(s)).  RADIOLOGY:  Dg Chest 1 View  Result Date: 08/16/2017 CLINICAL DATA:  Shortness of breath.  Follow-up exam. EXAM: CHEST 1 VIEW COMPARISON:  08/15/2017 FINDINGS: Status post CABG surgery. Cardiac silhouette top-normal in size. No mediastinal or hilar masses. Prominent bilateral bronchovascular markings. Central vascular congestion and hazy right perihilar airspace opacity noted on the prior study has improved. No new lung abnormalities. No convincing pleural effusion.  No pneumothorax. IMPRESSION: 1. Improved lung aeration when compared to the prior exam. Previous day's vascular congestion and right perihilar hazy opacity was likely asymmetric pulmonary edema superimposed on chronic lung changes. No new abnormalities. Electronically Signed   By: Lajean Manes M.D.   On: 08/16/2017 08:13   Dg Chest 1 View  Result Date: 08/15/2017 CLINICAL DATA:  Acute respiratory distress EXAM: CHEST 1 VIEW COMPARISON:  04/22/2016 FINDINGS: Chronic lung disease with hyperinflation and interstitial  coarsening. Chronic cardiomegaly. Status post CABG. Mitral annular calcification. Hazy asymmetric right perihilar density. IMPRESSION: 1. Hazy right perihilar density concerning for pneumonia in the appropriate clinical setting. 2. Background chronic lung disease. 3. Chronic cardiomegaly. Electronically Signed   By: Monte Fantasia M.D.   On: 08/15/2017 08:19     Management plans discussed with the patient, family and they are in agreement.  CODE STATUS:     Code Status Orders  (From admission, onward)        Start     Ordered   08/12/17 2146  Full code  Continuous     08/12/17 2145    Code Status History    Date Active Date Inactive Code Status Order ID Comments User Context   04/22/2016 18:16 04/25/2016 17:36 Full Code [id number]  Lytle Butte, MD ED   02/20/2016 04:49 02/21/2016 18:31 Full Code [id number]  Toy Baker, MD ED   08/10/2013 13:25 08/12/2013 15:46 Full Code [id number]  Rogelia Mire, NP Inpatient      TOTAL TIME TAKING CARE OF THIS PATIENT: *40* minutes.    Fritzi Mandes M.D on 08/16/2017 at 11:07 AM  Between 7am to 6pm - Pager - [phone] After 6pm go to [email or web address] - password [email or web address] [facility]  Office  [phone]  CC: Primary care physician; Tonia Ghent, MD

## 2017-08-16 NOTE — Clinical Social Work Note (Signed)
The patient will discharge to [facility] today via non-emergent EMS. The patient's family and facility are aware and in agreement. The CSW has sent all documentation to the facility. CSW will continue to follow pending additional discharge needs.  Santiago Bumpers, MSW, Latanya Presser [phone]

## 2017-08-16 NOTE — Progress Notes (Signed)
  Speech Language Pathology Treatment: Dysphagia  Patient Details Name: Jill Howell MRN: [id number] DOB: 1919/06/13 Today's Date: 08/16/2017 Time: 1315-1400 SLP Time Calculation (min) (ACUTE ONLY): 45 min  Assessment / Plan / Recommendation Clinical Impression  Pt seen for ongoing assessment of toleration of diet; trials to upgrade diet consistency from Honey consistency liquids. Pt is awake/alert, sitting in chair at bedside. Verbally conversive. NSG reported toleration of current diet.  Pt given trials of Nectar consistency liquids via Cup which she consumed (6 ozs) w/ no overt s/s of aspiration noted; no decline in respiratory status and no wet vocal quality. Pt then consumed mech soft foods w/ no overt s/s of aspiration and no gross oral phase deficits; appropriate oral clearing b/t trials. Pt helped to feed self but needed encouragement to initiate the lunch meal. Pt did state she had not had a bowel movement and was not hungry but then ate the cookies and applesauce w/ the Nectar liquids.  Pt does appear at min increased risk for dysphagia and aspiration secondary to current illness and baseline Cognitive decline (Dementia). No gross oropharyngeal phase dysphagia appreciated w/ the trials given. Recommend upgrade diet to Mech soft w/ NECTAR consistency diet w/ general aspiration precautions; pills in Puree. ST services will continue to f/u w/ trials to upgrade liquid in the diet as safe and appropriate for pt. Updated NSG staff on diet modifications. ST services will f/u next 2-3 days.     HPI HPI: Pt admitted for rhabdomyolysis.  PMH includes dementia, CAD, atrial fibrillation, Htn, hyperlipidemia, DM, allergic rhinitis, anemia, CHF and TIA.  CXR (+) Chronic lung disease with hyperinflation and interstitial coarsening. Chronic cardiomegaly. Status post CABG. Mitral annular calcification. Hazy asymmetric right perihilar density. Patient had shortness of breath this morning (12/7). Chest x-ray  shows possible pneumonia.  Pt currently on a regular diet with unrestricted liquids.  She denies swallowing difficulty, however does report hx of recurrent PNA.  Unable to confirm such via chart review.  Prior to admission, pt lived home alone.  She denies ever being on thickened liquids or a modified diet. Currently tolerating modified diet per BSE; awake, verbally conversive sitting in chair - min confusion noted (unsure of pt's baseline cognitive status).       SLP Plan  Continue with current plan of care       Recommendations  Diet recommendations: Dysphagia 3 (mechanical soft);Nectar-thick liquid Liquids provided via: Cup(monitor any straw use) Medication Administration: Whole meds with puree(crush only if needed for easier swallowing) Supervision: Patient able to self feed;Intermittent supervision to cue for compensatory strategies(setup assistance; encouragement) Compensations: Minimize environmental distractions;Slow rate;Small sips/bites;Lingual sweep for clearance of pocketing;Multiple dry swallows after each bite/sip;Follow solids with liquid Postural Changes and/or Swallow Maneuvers: Seated upright 90 degrees;Upright 30-60 min after meal                General recommendations: (Dietitian f/u) Oral Care Recommendations: Oral care BID;Patient independent with oral care;Staff/trained caregiver to provide oral care Follow up Recommendations: Skilled Nursing facility SLP Visit Diagnosis: Dysphagia, pharyngeal phase (R13.13) Plan: Continue with current plan of care       Laird, [place], CCC-SLP Teyon Odette 08/16/2017, 2:14 PM

## 2017-08-18 DIAGNOSIS — I480 Paroxysmal atrial fibrillation: Secondary | ICD-10-CM | POA: Diagnosis not present

## 2017-08-18 DIAGNOSIS — F0390 Unspecified dementia without behavioral disturbance: Secondary | ICD-10-CM | POA: Diagnosis not present

## 2017-08-18 DIAGNOSIS — T796XXA Traumatic ischemia of muscle, initial encounter: Secondary | ICD-10-CM | POA: Diagnosis not present

## 2017-08-18 DIAGNOSIS — I5032 Chronic diastolic (congestive) heart failure: Secondary | ICD-10-CM | POA: Diagnosis not present

## 2017-08-18 DIAGNOSIS — R296 Repeated falls: Secondary | ICD-10-CM | POA: Diagnosis not present

## 2017-08-18 DIAGNOSIS — E119 Type 2 diabetes mellitus without complications: Secondary | ICD-10-CM | POA: Diagnosis not present

## 2017-08-21 ENCOUNTER — Inpatient Hospital Stay: Payer: Self-pay | Admitting: Family Medicine

## 2017-08-25 ENCOUNTER — Other Ambulatory Visit: Payer: Self-pay | Admitting: *Deleted

## 2017-08-25 NOTE — Patient Outreach (Signed)
[facility]) Care Management  08/25/2017  Kenzly Rogoff Reckart [birth year] [phone]   Met with patient at facility. She reports she plans to return home, she lives alone but her son assists her. She states He assists with transportation, medication management. He is looking for someone to stay with her for a few hours after patient discharges.  RNCM reviewed [facility] program with patient and gave contact information and brochure for patient son to review. RNCM spoke with business office manager and let her know patient is eligible for [facility] services and information left at bedside for patient son to review.   Plan to follow up as indicated. Royetta Crochet. Laymond Purser, RN, BSN, [place] [phone]) Business Cell  [phone]) Toll Free Office

## 2017-08-29 DIAGNOSIS — K219 Gastro-esophageal reflux disease without esophagitis: Secondary | ICD-10-CM | POA: Diagnosis not present

## 2017-08-29 DIAGNOSIS — I509 Heart failure, unspecified: Secondary | ICD-10-CM | POA: Diagnosis not present

## 2017-08-29 DIAGNOSIS — I10 Essential (primary) hypertension: Secondary | ICD-10-CM | POA: Diagnosis not present

## 2017-08-29 DIAGNOSIS — G3184 Mild cognitive impairment, so stated: Secondary | ICD-10-CM | POA: Diagnosis not present

## 2017-08-31 ENCOUNTER — Emergency Department: Payer: Medicare Other

## 2017-08-31 ENCOUNTER — Inpatient Hospital Stay
Admission: EM | Admit: 2017-08-31 | Discharge: 2017-09-05 | DRG: 871 | Disposition: A | Discharge status: Skilled Nursing Facility | Payer: Medicare Other | Attending: Internal Medicine | Admitting: Internal Medicine

## 2017-08-31 DIAGNOSIS — H9190 Unspecified hearing loss, unspecified ear: Secondary | ICD-10-CM | POA: Diagnosis present

## 2017-08-31 DIAGNOSIS — Z8249 Family history of ischemic heart disease and other diseases of the circulatory system: Secondary | ICD-10-CM | POA: Diagnosis not present

## 2017-08-31 DIAGNOSIS — E785 Hyperlipidemia, unspecified: Secondary | ICD-10-CM | POA: Diagnosis present

## 2017-08-31 DIAGNOSIS — Z880 Allergy status to penicillin: Secondary | ICD-10-CM

## 2017-08-31 DIAGNOSIS — J9690 Respiratory failure, unspecified, unspecified whether with hypoxia or hypercapnia: Secondary | ICD-10-CM

## 2017-08-31 DIAGNOSIS — M6281 Muscle weakness (generalized): Secondary | ICD-10-CM | POA: Diagnosis not present

## 2017-08-31 DIAGNOSIS — K219 Gastro-esophageal reflux disease without esophagitis: Secondary | ICD-10-CM | POA: Diagnosis present

## 2017-08-31 DIAGNOSIS — Z820 Family history of epilepsy and other diseases of the nervous system: Secondary | ICD-10-CM

## 2017-08-31 DIAGNOSIS — A419 Sepsis, unspecified organism: Secondary | ICD-10-CM | POA: Diagnosis present

## 2017-08-31 DIAGNOSIS — T796XXD Traumatic ischemia of muscle, subsequent encounter: Secondary | ICD-10-CM | POA: Diagnosis not present

## 2017-08-31 DIAGNOSIS — Z9049 Acquired absence of other specified parts of digestive tract: Secondary | ICD-10-CM | POA: Diagnosis not present

## 2017-08-31 DIAGNOSIS — I2510 Atherosclerotic heart disease of native coronary artery without angina pectoris: Secondary | ICD-10-CM | POA: Diagnosis present

## 2017-08-31 DIAGNOSIS — I5032 Chronic diastolic (congestive) heart failure: Secondary | ICD-10-CM | POA: Diagnosis not present

## 2017-08-31 DIAGNOSIS — F0280 Dementia in other diseases classified elsewhere without behavioral disturbance: Secondary | ICD-10-CM | POA: Diagnosis not present

## 2017-08-31 DIAGNOSIS — I161 Hypertensive emergency: Secondary | ICD-10-CM | POA: Diagnosis present

## 2017-08-31 DIAGNOSIS — Z515 Encounter for palliative care: Secondary | ICD-10-CM | POA: Diagnosis not present

## 2017-08-31 DIAGNOSIS — I110 Hypertensive heart disease with heart failure: Secondary | ICD-10-CM | POA: Diagnosis present

## 2017-08-31 DIAGNOSIS — E538 Deficiency of other specified B group vitamins: Secondary | ICD-10-CM | POA: Diagnosis not present

## 2017-08-31 DIAGNOSIS — Z8673 Personal history of transient ischemic attack (TIA), and cerebral infarction without residual deficits: Secondary | ICD-10-CM | POA: Diagnosis not present

## 2017-08-31 DIAGNOSIS — I5033 Acute on chronic diastolic (congestive) heart failure: Secondary | ICD-10-CM | POA: Diagnosis present

## 2017-08-31 DIAGNOSIS — Z79899 Other long term (current) drug therapy: Secondary | ICD-10-CM

## 2017-08-31 DIAGNOSIS — F0390 Unspecified dementia without behavioral disturbance: Secondary | ICD-10-CM | POA: Diagnosis present

## 2017-08-31 DIAGNOSIS — J189 Pneumonia, unspecified organism: Secondary | ICD-10-CM | POA: Diagnosis not present

## 2017-08-31 DIAGNOSIS — Z7189 Other specified counseling: Secondary | ICD-10-CM

## 2017-08-31 DIAGNOSIS — I252 Old myocardial infarction: Secondary | ICD-10-CM | POA: Diagnosis not present

## 2017-08-31 DIAGNOSIS — Z7982 Long term (current) use of aspirin: Secondary | ICD-10-CM | POA: Diagnosis not present

## 2017-08-31 DIAGNOSIS — I248 Other forms of acute ischemic heart disease: Secondary | ICD-10-CM | POA: Diagnosis present

## 2017-08-31 DIAGNOSIS — Z951 Presence of aortocoronary bypass graft: Secondary | ICD-10-CM | POA: Diagnosis not present

## 2017-08-31 DIAGNOSIS — E440 Moderate protein-calorie malnutrition: Secondary | ICD-10-CM | POA: Diagnosis not present

## 2017-08-31 DIAGNOSIS — Z66 Do not resuscitate: Secondary | ICD-10-CM | POA: Diagnosis not present

## 2017-08-31 DIAGNOSIS — J45909 Unspecified asthma, uncomplicated: Secondary | ICD-10-CM | POA: Diagnosis not present

## 2017-08-31 DIAGNOSIS — Z6826 Body mass index (BMI) 26.0-26.9, adult: Secondary | ICD-10-CM

## 2017-08-31 DIAGNOSIS — R0602 Shortness of breath: Secondary | ICD-10-CM | POA: Diagnosis not present

## 2017-08-31 DIAGNOSIS — E039 Hypothyroidism, unspecified: Secondary | ICD-10-CM | POA: Diagnosis present

## 2017-08-31 DIAGNOSIS — I480 Paroxysmal atrial fibrillation: Secondary | ICD-10-CM | POA: Diagnosis not present

## 2017-08-31 DIAGNOSIS — E119 Type 2 diabetes mellitus without complications: Secondary | ICD-10-CM | POA: Diagnosis present

## 2017-08-31 DIAGNOSIS — J9601 Acute respiratory failure with hypoxia: Secondary | ICD-10-CM | POA: Diagnosis not present

## 2017-08-31 DIAGNOSIS — Z79890 Hormone replacement therapy (postmenopausal): Secondary | ICD-10-CM

## 2017-08-31 DIAGNOSIS — I509 Heart failure, unspecified: Secondary | ICD-10-CM | POA: Diagnosis not present

## 2017-08-31 DIAGNOSIS — Z888 Allergy status to other drugs, medicaments and biological substances status: Secondary | ICD-10-CM

## 2017-08-31 DIAGNOSIS — I059 Rheumatic mitral valve disease, unspecified: Secondary | ICD-10-CM | POA: Diagnosis present

## 2017-08-31 DIAGNOSIS — W19XXXD Unspecified fall, subsequent encounter: Secondary | ICD-10-CM | POA: Diagnosis not present

## 2017-08-31 DIAGNOSIS — E0590 Thyrotoxicosis, unspecified without thyrotoxic crisis or storm: Secondary | ICD-10-CM | POA: Diagnosis present

## 2017-08-31 DIAGNOSIS — I5031 Acute diastolic (congestive) heart failure: Secondary | ICD-10-CM

## 2017-08-31 DIAGNOSIS — Z9189 Other specified personal risk factors, not elsewhere classified: Secondary | ICD-10-CM

## 2017-08-31 DIAGNOSIS — Y95 Nosocomial condition: Secondary | ICD-10-CM | POA: Diagnosis present

## 2017-08-31 DIAGNOSIS — I959 Hypotension, unspecified: Secondary | ICD-10-CM | POA: Diagnosis present

## 2017-08-31 DIAGNOSIS — D649 Anemia, unspecified: Secondary | ICD-10-CM | POA: Diagnosis not present

## 2017-08-31 DIAGNOSIS — J810 Acute pulmonary edema: Secondary | ICD-10-CM | POA: Diagnosis not present

## 2017-08-31 DIAGNOSIS — I361 Nonrheumatic tricuspid (valve) insufficiency: Secondary | ICD-10-CM | POA: Diagnosis not present

## 2017-08-31 DIAGNOSIS — I272 Pulmonary hypertension, unspecified: Secondary | ICD-10-CM | POA: Diagnosis present

## 2017-08-31 DIAGNOSIS — Z7401 Bed confinement status: Secondary | ICD-10-CM | POA: Diagnosis not present

## 2017-08-31 LAB — BASIC METABOLIC PANEL
Anion gap: 8 (ref 5–15)
BUN: 24 mg/dL — AB (ref 6–20)
CHLORIDE: 100 mmol/L — AB (ref 101–111)
CO2: 25 mmol/L (ref 22–32)
CREATININE: 1.05 mg/dL — AB (ref 0.44–1.00)
Calcium: 8.6 mg/dL — ABNORMAL LOW (ref 8.9–10.3)
GFR calc Af Amer: 50 mL/min — ABNORMAL LOW (ref >60)
GFR calc non Af Amer: 43 mL/min — ABNORMAL LOW (ref >60)
Glucose, Bld: 154 mg/dL — ABNORMAL HIGH (ref 65–99)
POTASSIUM: 4.2 mmol/L (ref 3.5–5.1)
SODIUM: 133 mmol/L — AB (ref 135–145)

## 2017-08-31 LAB — CBC
HEMATOCRIT: 33.6 % — AB (ref 35.0–47.0)
HEMOGLOBIN: 11.2 g/dL — AB (ref 12.0–16.0)
MCH: 28.9 pg (ref 26.0–34.0)
MCHC: 33.3 g/dL (ref 32.0–36.0)
MCV: 86.8 fL (ref 80.0–100.0)
Platelets: 285 10*3/uL (ref 150–440)
RBC: 3.88 MIL/uL (ref 3.80–5.20)
RDW: 15 % — ABNORMAL HIGH (ref 11.5–14.5)
WBC: 14.9 10*3/uL — ABNORMAL HIGH (ref 3.6–11.0)

## 2017-08-31 LAB — TROPONIN I: Troponin I: 0.04 ng/mL (ref <0.03)

## 2017-08-31 LAB — GLUCOSE, CAPILLARY: Glucose-Capillary: 183 mg/dL — ABNORMAL HIGH (ref 65–99)

## 2017-08-31 LAB — LACTIC ACID, PLASMA: Lactic Acid, Venous: 2.7 mmol/L (ref 0.5–1.9)

## 2017-08-31 LAB — BRAIN NATRIURETIC PEPTIDE: B NATRIURETIC PEPTIDE 5: 948 pg/mL — AB (ref 0.0–100.0)

## 2017-08-31 LAB — DIGOXIN LEVEL: Digoxin Level: 0.7 ng/mL — ABNORMAL LOW (ref 0.8–2.0)

## 2017-08-31 MED ORDER — DIGOXIN 125 MCG PO TABS
125.0000 ug | ORAL_TABLET | Freq: Every day | ORAL | Status: DC
  Administered 2017-09-01 – 2017-09-05 (×5): 125 ug via ORAL
  Filled 2017-08-31 (×5): qty 1

## 2017-08-31 MED ORDER — PANTOPRAZOLE SODIUM 40 MG PO TBEC
40.0000 mg | DELAYED_RELEASE_TABLET | Freq: Every day | ORAL | Status: DC
  Administered 2017-09-01 – 2017-09-02 (×2): 40 mg via ORAL
  Filled 2017-08-31 (×2): qty 1

## 2017-08-31 MED ORDER — VANCOMYCIN HCL IN DEXTROSE 1-5 GM/200ML-% IV SOLN
1000.0000 mg | Freq: Once | INTRAVENOUS | Status: AC
  Administered 2017-09-01: 1000 mg via INTRAVENOUS
  Filled 2017-08-31: qty 200

## 2017-08-31 MED ORDER — LOSARTAN POTASSIUM 50 MG PO TABS: 25.0000 mg | ORAL_TABLET | Freq: Every day | ORAL | Status: DC

## 2017-08-31 MED ORDER — NITROGLYCERIN 0.4 MG SL SUBL: 0.4000 mg | SUBLINGUAL_TABLET | SUBLINGUAL | Status: DC | PRN

## 2017-08-31 MED ORDER — LEVOTHYROXINE SODIUM 100 MCG PO TABS
100.0000 ug | ORAL_TABLET | Freq: Every day | ORAL | Status: DC
  Administered 2017-09-01 – 2017-09-05 (×5): 100 ug via ORAL
  Filled 2017-08-31 (×5): qty 1

## 2017-08-31 MED ORDER — POLYETHYLENE GLYCOL 3350 17 G PO PACK
17.0000 g | PACK | Freq: Every day | ORAL | Status: DC | PRN
  Administered 2017-09-03: 17 g via ORAL
  Filled 2017-08-31: qty 1

## 2017-08-31 MED ORDER — ACETAMINOPHEN 650 MG RE SUPP: 650.0000 mg | Freq: Four times a day (QID) | RECTAL | Status: DC | PRN

## 2017-08-31 MED ORDER — ISOSORBIDE MONONITRATE ER 30 MG PO TB24: 30.0000 mg | ORAL_TABLET | Freq: Every day | ORAL | Status: DC

## 2017-08-31 MED ORDER — SODIUM CHLORIDE 0.9% FLUSH
3.0000 mL | INTRAVENOUS | Status: DC | PRN
  Administered 2017-09-04: 22:00:00 3 mL via INTRAVENOUS
  Filled 2017-08-31: qty 3

## 2017-08-31 MED ORDER — SIMVASTATIN 20 MG PO TABS
40.0000 mg | ORAL_TABLET | Freq: Every day | ORAL | Status: DC
  Administered 2017-09-01 – 2017-09-04 (×4): 40 mg via ORAL
  Filled 2017-08-31 (×4): qty 2

## 2017-08-31 MED ORDER — ENOXAPARIN SODIUM 30 MG/0.3ML ~~LOC~~ SOLN
30.0000 mg | SUBCUTANEOUS | Status: DC
  Administered 2017-08-31 – 2017-09-04 (×5): 30 mg via SUBCUTANEOUS
  Filled 2017-08-31 (×5): qty 0.3

## 2017-08-31 MED ORDER — DEXTROSE 5 % IV SOLN
2.0000 g | INTRAVENOUS | Status: DC
  Administered 2017-08-31 – 2017-09-03 (×4): 2 g via INTRAVENOUS
  Filled 2017-08-31 (×6): qty 2

## 2017-08-31 MED ORDER — QUETIAPINE FUMARATE 25 MG PO TABS
25.0000 mg | ORAL_TABLET | Freq: Every day | ORAL | Status: DC
  Administered 2017-09-01 – 2017-09-04 (×3): 25 mg via ORAL
  Filled 2017-08-31 (×3): qty 1

## 2017-08-31 MED ORDER — NITROGLYCERIN 0.3 MG SL SUBL
0.3000 mg | SUBLINGUAL_TABLET | SUBLINGUAL | Status: DC | PRN
  Filled 2017-08-31: qty 100

## 2017-08-31 MED ORDER — VANCOMYCIN HCL IN DEXTROSE 1-5 GM/200ML-% IV SOLN
1000.0000 mg | Freq: Once | INTRAVENOUS | Status: AC
  Administered 2017-08-31: 1000 mg via INTRAVENOUS
  Filled 2017-08-31: qty 200

## 2017-08-31 MED ORDER — ACETAMINOPHEN 325 MG PO TABS: 650.0000 mg | ORAL_TABLET | Freq: Four times a day (QID) | ORAL | Status: DC | PRN

## 2017-08-31 MED ORDER — ASPIRIN EC 81 MG PO TBEC
81.0000 mg | DELAYED_RELEASE_TABLET | Freq: Every day | ORAL | Status: DC
  Administered 2017-09-01 – 2017-09-05 (×5): 81 mg via ORAL
  Filled 2017-08-31 (×5): qty 1

## 2017-08-31 MED ORDER — ONDANSETRON HCL 4 MG PO TABS: 4.0000 mg | ORAL_TABLET | Freq: Four times a day (QID) | ORAL | Status: DC | PRN

## 2017-08-31 MED ORDER — VITAMIN B-12 1000 MCG PO TABS
1000.0000 ug | ORAL_TABLET | Freq: Every day | ORAL | Status: DC
  Administered 2017-09-01 – 2017-09-05 (×5): 1000 ug via ORAL
  Filled 2017-08-31 (×5): qty 1

## 2017-08-31 MED ORDER — DEXTROSE 5 % IV SOLN
2.0000 g | Freq: Once | INTRAVENOUS | Status: AC
  Administered 2017-08-31: 2 g via INTRAVENOUS
  Filled 2017-08-31: qty 2

## 2017-08-31 MED ORDER — SODIUM CHLORIDE 0.9 % IV SOLN: 250.0000 mL | INTRAVENOUS | Status: DC | PRN

## 2017-08-31 MED ORDER — SODIUM CHLORIDE 0.9% FLUSH
3.0000 mL | Freq: Two times a day (BID) | INTRAVENOUS | Status: DC
  Administered 2017-08-31 – 2017-09-05 (×8): 3 mL via INTRAVENOUS

## 2017-08-31 MED ORDER — HYDROCODONE-ACETAMINOPHEN 5-325 MG PO TABS: 1.0000 | ORAL_TABLET | ORAL | Status: DC | PRN

## 2017-08-31 MED ORDER — ONDANSETRON HCL 4 MG/2ML IJ SOLN: 4.0000 mg | Freq: Four times a day (QID) | INTRAMUSCULAR | Status: DC | PRN

## 2017-08-31 MED ORDER — LEVOTHYROXINE SODIUM 100 MCG PO TABS: 100.0000 ug | ORAL_TABLET | Freq: Every day | ORAL | Status: DC

## 2017-08-31 MED ORDER — ATENOLOL 25 MG PO TABS
25.0000 mg | ORAL_TABLET | Freq: Every day | ORAL | Status: DC
  Administered 2017-09-01: 25 mg via ORAL
  Filled 2017-08-31: qty 1

## 2017-08-31 MED ORDER — BUMETANIDE 1 MG PO TABS
1.0000 mg | ORAL_TABLET | Freq: Two times a day (BID) | ORAL | Status: DC
  Administered 2017-09-01 – 2017-09-05 (×9): 1 mg via ORAL
  Filled 2017-08-31 (×11): qty 1

## 2017-08-31 NOTE — ED Notes (Signed)
xray at bedside.  

## 2017-08-31 NOTE — Progress Notes (Signed)
[facility] Progress Note Patient Name: Jill Howell DOB: 1919-07-18 MRN: [id number]   Date of Service  08/31/2017  HPI/Events of Note  83 yo NH pt with acute resp failure chf vs HCAP responding to bipap  eICU Interventions  No escalation of care needed/ agree with abx and diruesis/ continued bipap for tonight     Intervention Category Major Interventions: Respiratory failure - evaluation and management  Christinia Gully 08/31/2017, 11:18 PM

## 2017-08-31 NOTE — ED Provider Notes (Signed)
[facility] Emergency Department Provider Note  ____________________________________________   First MD Initiated Contact with Patient 08/31/17 2048     (approximate)  I have reviewed the triage vital signs and the nursing notes.   HISTORY  Chief Complaint Shortness of Breath  EM caveat: Shortness of breath, dyspnea limits examination.  She will provide most of the history but unable to provide a full and complete history and review of systems due to dyspnea  HPI Jill Howell is a 83 y.o. female sent for evaluation of shortness of breath  Patient reports about 6:00 she started developing shortness of breath which is worsened over the last couple hours.  According to the nursing home, they evaluated her and called EMS at 8 PM.  She reports she is quite short of breath.  Denies wheezing.  No nausea or vomiting.  No chest pain.  Denies pain anywhere.  She does give a history that her legs been swollen recently.  She reports she has asthma but what she is experiencing today is never happened before  EMS initiate the patient on CPAP with improvement, also noticed that she had rales throughout, gave 1 nebulizer treatment given the history of asthma which was reported, and also placed an inch of nitroglycerin paste with some improvement in her oxygen saturations  Patient does report that placing her on BiPAP has helped her breathing  Past Medical History:  Diagnosis Date  . Allergic rhinitis, cause unspecified   . Anemia   . Asthma   . Atrial fibrillation ([place])    a. remote->no anticoagulation 2/2 h/o falls.  [name] CAD (coronary artery disease)    a. 2000 s/p MI/CABG x 4 (VG-PDA, VG-OM, VG-Diag, LIMA-LAD);  b. 2011 MI;  c. 08/2013 NSTEMI/Cath: LM 90-95, LAD 90/100p, LCX 99p, 145m, RCA 100ost, VG-PDA nl, VG-OM 100 (culprit w/ thrombus), VG-Diag nl, LIMA-LAD nl, EF 55-60%, mild inf HK, mod MR-->Med Rx.  . Carotid arterial disease ([place])    a. 05/2013 s/p L CEA.  .  Chronic diastolic CHF (congestive heart failure) ([place])    a. 08/2013 Echo: EF 50-55%, no rwma, Gr2 DD, mod MR, mildly dil LA, PASP 55mmHg.  [name] Esophageal reflux   . History of TIA (transient ischemic attack)   . Hyperlipidemia   . Hypertension   . Hypothyroidism   . Myocardial infarction ([place])    08-2013  . Type II or unspecified type diabetes mellitus without mention of complication, not stated as uncontrolled   . Unspecified hearing loss     Patient Active Problem List   Diagnosis Date Noted  . Rhabdomyolysis 08/12/2017  . Advance care planning 01/08/2017  . Hx of CABG 12/26/2016  . Cough 04/28/2016  . Hyperkalemia 04/22/2016  . Other fatigue 04/17/2016  . Pressure ulcer 02/20/2016  . Atherosclerosis of native coronary artery of native heart with stable angina pectoris ([place]) 02/19/2016  . Dementia 02/19/2016  . Hyponatremia 02/19/2016  . Chronic diastolic congestive heart failure ([place]) 02/19/2016  . Carotid stenosis 06/09/2014  . Asthmatic bronchitis , chronic ([place]) 03/24/2014  . Family discord 02/18/2014  . Aftercare following surgery of the circulatory system, [place] 12/07/2013  . Malaise and fatigue 12/01/2013  . Benign hypertensive heart disease without heart failure 08/17/2013  . At risk for falls 08/12/2013  . Anemia   . TIA (transient ischemic attack) 05/17/2013  . Occlusion and stenosis of carotid artery without mention of cerebral infarction 05/13/2013  . Fall at home 05/11/2013  . PAF (paroxysmal atrial  fibrillation) ([place]) 08/11/2012  . Carotid artery disease ([place]) 08/19/2011  . Dermatitis 05/09/2011  . Hypothyroidism 09/15/2007  . History of diabetes mellitus, type II 09/15/2007  . Hyperlipidemia 09/15/2007  . HEARING IMPAIRMENT 09/15/2007  . Coronary atherosclerosis 09/15/2007  . Congestive heart failure ([place]) 09/15/2007  . ALLERGIC RHINITIS 09/15/2007  . GERD 09/15/2007    Past Surgical History:  Procedure Laterality Date  . ABDOMINAL HYSTERECTOMY    .  Bladder tack     x 2  . CARDIAC CATHETERIZATION  09/07/1999   EF 25%. THERE IS SEVERE MITRAL ANNULAR CALIFICATION, WITH  3 + INSUFFICIENCY  . CARDIOVASCULAR STRESS TEST  06/30/2007   EF 63%, NO ISCHEMIA  . CAROTID ENDARTERECTOMY Left 05-19-13   cea  . CHOLECYSTECTOMY  10/2001  . CORONARY ARTERY BYPASS GRAFT  2000   4 vessel  . DILATION AND CURETTAGE OF UTERUS    . ENDARTERECTOMY Left 05/19/2013   Procedure: ENDARTERECTOMY CAROTID-LEFT;  Surgeon: Elam Dutch, MD;  Location: [facility];  Service: Vascular;  Laterality: Left;  . EYE SURGERY Bilateral    Cataract  . HEMORRHOID SURGERY    . LEFT HEART CATH  Dec. 2, 2014   With  Coronary Graft  Angiogram  . LEFT HEART CATHETERIZATION WITH CORONARY/GRAFT ANGIOGRAM  08/10/2013   Procedure: LEFT HEART CATHETERIZATION WITH Beatrix Fetters;  Surgeon: Peter M Martinique, MD;  Location: [facility] CATH LAB;  Service: Cardiovascular;;  . PARTIAL HYSTERECTOMY    . RECTOCELE REPAIR     x 2  . ROTATOR CUFF REPAIR    . TONSILLECTOMY    . US ECHOCARDIOGRAPHY  01/17/2005   EF 55-60%    Prior to Admission medications   Medication Sig Start Date End Date Taking? Authorizing Provider  aspirin 81 MG EC tablet Take 81 mg by mouth daily.     Yes [provider]  atenolol (TENORMIN) 25 MG tablet Take 1 tablet (25 mg total) by mouth daily. 06/10/17  Yes Minna Merritts, MD  BIOTIN PO Take 1 capsule by mouth daily.    Yes [provider]  [place] 125 MCG tablet Take 1 tablet by mouth daily. 05/30/17  Yes [provider]  isosorbide mononitrate (IMDUR) 30 MG 24 hr tablet TAKE ONE TABLET EVERY DAY 02/04/17  Yes Gollan, Kathlene November, MD  levothyroxine (SYNTHROID, LEVOTHROID) 100 MCG tablet Take 1 tablet (100 mcg total) by mouth daily. 01/08/17  Yes Tonia Ghent, MD  losartan (COZAAR) 25 MG tablet TAKE ONE TABLET EVERY DAY 02/04/17  Yes Gollan, Kathlene November, MD  Multiple Vitamins-Minerals (PRESERVISION AREDS PO) Take 1 tablet by mouth daily.   Yes  [provider]  nitroGLYCERIN (NITROSTAT) 0.4 MG SL tablet PLACE 1 TABLET UNDER THE TONGUE EVERY 5 MINUTES AS NEEDED FOR CHEST PAIN. 03/01/15  Yes Darlin Coco, MD  pantoprazole (PROTONIX) 40 MG tablet TAKE ONE TABLET EVERY DAY 04/09/17  Yes Tonia Ghent, MD  QUEtiapine (SEROQUEL) 25 MG tablet TAKE ONE TABLET BY MOUTH EVERY EVENING AT BEDTIME 01/08/17  Yes Tonia Ghent, MD  simvastatin (ZOCOR) 40 MG tablet TAKE ONE TABLET EVERY DAY 05/30/17  Yes Tonia Ghent, MD  vitamin B-12 (CYANOCOBALAMIN) 1000 MCG tablet Take 1,000 mcg by mouth daily.    Yes [provider]    Allergies Furosemide and Penicillins  Family History  Problem Relation Age of Onset  . Heart attack Father        X's 3  . Heart disease Father   . Heart  disease Son        before age 51  . Cancer Brother   . Hypertension Mother   . Alzheimer's disease Mother     Social History Social History   Tobacco Use  . Smoking status: Never Smoker  . Smokeless tobacco: Never Used  Substance Use Topics  . Alcohol use: No    Alcohol/week: 0.0 oz  . Drug use: No    Review of Systems Constitutional: No fever/chills Cardiovascular: Denies chest pain. Respiratory: See HPI gastrointestinal: No abdominal pain.  No nausea, no vomiting.  No diarrhea.  No constipation. Genitourinary: Negative for dysuria. Musculoskeletal: Bilateral lower leg swelling reported Skin: Negative for rash. Neurological: Negative for headaches, focal weakness or numbness.    ____________________________________________   PHYSICAL EXAM:  VITAL SIGNS: ED Triage Vitals  Enc Vitals Group     BP      Pulse      Resp      Temp      Temp src      SpO2      Weight      Height      Head Circumference      Peak Flow      Pain Score      Pain Loc      Pain Edu?      Excl. in [place]?     Constitutional: Alert and oriented.  Initially on CPAP, taken off for about 5 minutes to collect history before careful  examination. Eyes: Conjunctivae are normal. Head: Atraumatic. Nose: No congestion/rhinnorhea.  Patient has notable JVD would relax to 30 degrees and also reports shortness of breath worsens. Mouth/Throat: Mucous membranes are moist. Neck: No stridor.   Cardiovascular: Slight tachycardic rate, regular rhythm. Grossly normal heart sounds.  Good peripheral circulation. Respiratory: Moderate increased work of breathing, accessory muscle use, mild diaphragmatic breathing, speaks in 1-2 word sentences.  Lungs with crackles in the bases bilaterally, upper lobe seem to be more clear.  No focal unilateral abnormalities.  No wheezing. Gastrointestinal: Soft and nontender. No distention. Musculoskeletal: No lower extremity tenderness has 2+ lower extremity pitting edema bilateral Neurologic:  Normal speech and language. No gross focal neurologic deficits are appreciated.  Skin:  Skin is warm, dry and intact. No rash noted. Psychiatric: Mood and affect are normal. Speech and behavior are normal.  ____________________________________________   LABS (all labs ordered are listed, but only abnormal results are displayed)  Labs Reviewed  BASIC METABOLIC PANEL - Abnormal; Notable for the following components:      Result Value   Sodium 133 (*)    Chloride 100 (*)    Glucose, Bld 154 (*)    BUN 24 (*)    Creatinine, Ser 1.05 (*)    Calcium 8.6 (*)    GFR calc non Af Amer 43 (*)    GFR calc Af Amer 50 (*)    All other components within normal limits  TROPONIN I - Abnormal; Notable for the following components:   Troponin I 0.04 (*)    All other components within normal limits  CBC - Abnormal; Notable for the following components:   WBC 14.9 (*)    Hemoglobin 11.2 (*)    HCT 33.6 (*)    RDW 15.0 (*)    All other components within normal limits  LACTIC ACID, PLASMA - Abnormal; Notable for the following components:   Lactic Acid, Venous 2.7 (*)    All other components within normal limits  CULTURE, BLOOD (ROUTINE X 2)  CULTURE, BLOOD (ROUTINE X 2)  BRAIN NATRIURETIC PEPTIDE  BLOOD GAS, VENOUS  LACTIC ACID, PLASMA  DIGOXIN LEVEL   ____________________________________________  EKG  Reviewed interrupt me at 2050 Heart rate 115 QRS 95 QTC 410 Notable respiratory variation does interfere somewhat, however appears sinus tachycardia, some depression possibly in lead I, possible inverted T waves V4 through V6, no obvious ST elevation ____________________________________________  RADIOLOGY  Dg Chest Port 1 View  Result Date: 08/31/2017 CLINICAL DATA:  83 year old female with shortness of breath. EXAM: PORTABLE CHEST 1 VIEW COMPARISON:  Chest radiograph dated 08/16/2017 FINDINGS: Patchy areas of airspace opacity primarily in the right upper lung field and to a lesser degree left mid and lower lung fields most consistent with multifocal pneumonia and less likely asymmetric edema. Clinical correlation is recommended. There is a small bilateral pleural effusions. No pneumothorax. Mild cardiomegaly. Median sternotomy wires and CABG vascular clips noted. There is atherosclerotic calcification of the aortic arch. Right shoulder anchor pins noted. No acute osseous pathology. IMPRESSION: Findings most consistent with multifocal pneumonia and less likely asymmetric edema. Clinical correlation is recommended. Small bilateral pleural effusions. Electronically Signed   By: Anner Crete M.D.   On: 08/31/2017 21:21  Concerning for multifocal pneumonia although edema also considered.  ____________________________________________   PROCEDURES  Procedure(s) performed: None  Procedures  Critical Care performed: Yes, see critical care note(s) CRITICAL CARE Performed by: Delman Kitten   Total critical care time: 40 minutes  Critical care time was exclusive of separately billable procedures and treating other patients.  Critical care was necessary to treat or prevent imminent or  life-threatening deterioration.  Critical care was time spent personally by me on the following activities: development of treatment plan with patient and/or surrogate as well as nursing, discussions with consultants, evaluation of patient's response to treatment, examination of patient, obtaining history from patient or surrogate, ordering and performing treatments and interventions, ordering and review of laboratory studies, ordering and review of radiographic studies, pulse oximetry and re-evaluation of patient's condition.  ____________________________________________   INITIAL IMPRESSION / ASSESSMENT AND PLAN / ED COURSE  Pertinent labs & imaging results that were available during my care of the patient were reviewed by me and considered in my medical decision making (see chart for details).  Patient resents for dyspnea, crackles noted in the lung fields, hypoxia, and increased work of breathing.  Does carry a history of asthma congestive heart failure, but also noted to have a very low-grade temperature, elevated white count, and chest x-ray very concerning for multifocal pneumonia, also possibly some volume overload.  However, patient is tolerating BiPAP very well, concern for possible healthcare associated pneumonia given her recent admission will cover broadly with antibiotics, anticipate admission to the hospital,  No symptoms of ACS.  Clinical Course as of Sep 01 2155  Nancy Fetter Aug 31, 2017  2126 ED Sepsis - Repeat Assessment   Performed at:    ----------------------------------------- 9:26 PM on @EDTODAY @ -----------------------------------------    Last Vitals:    @VITALSNOTREFRESHABLE @  Heart:      Clear tones  Lungs:     Diminished bases bilateral, good air movement upper left lobe, question some diminishment on the right  Capillary Refill:   Normal, less than 1 second  Peripheral Pulse (include location): Right radial   Skin (include color):   Warm pink well  perfused  Patient alert, able to speak of her BiPAP now does report her shortness of breath notably better.  Her  chest ray x-ray appears like she [place] have developed healthcare associated pneumonia especially in the right upper lobe, and there is some cephalization on my suspicious for overlying CHF but I will wait on further blood work fluid creatinine BNP to help guide therapy.  Patient family at bedside, aware of plan for admission.   [MQ]    Clinical Course User Index [MQ] Delman Kitten, MD    Patient status improved, heart rate 100, SPO2 97% tolerating BiPAP very well.  Oxygenation improved, work of breathing appears to be improving.  Blood pressure remains elevated 190/94, slowly coming.  The patient is showing improvement ____________________________________________   FINAL CLINICAL IMPRESSION(S) / ED DIAGNOSES  Final diagnoses:  HCAP (healthcare-associated pneumonia)  Sepsis, due to unspecified organism ([place])  At risk for fluid volume overload      NEW MEDICATIONS STARTED DURING THIS VISIT:  This SmartLink is deprecated. Use AVSMEDLIST instead to display the medication list for a patient.   Note:  This document was prepared using Dragon voice recognition software and [place] include unintentional dictation errors.     Delman Kitten, MD 08/31/17 2157

## 2017-08-31 NOTE — ED Notes (Signed)
Admitting Provider at bedside. 

## 2017-08-31 NOTE — H&P (Signed)
[facility] NAME: Jill Howell    MR#:  [id number]  DATE OF BIRTH:  [birth year]  DATE OF ADMISSION:  08/31/2017  PRIMARY CARE PHYSICIAN: Tonia Ghent, MD   REQUESTING/REFERRING PHYSICIAN:   CHIEF COMPLAINT:   Chief Complaint  Patient presents with  . Shortness of Breath    HISTORY OF PRESENT ILLNESS: Jill Howell  is a 83 y.o. female with a known history per below, recently placed in local SNF after fall for acute rhabdomyolysis approximately 12 days ago, presenting with acute shortness of breath, orthopnea, transported via EMS requiring CPAP which was converted to BiPAP in the emergency room, patient noted to be tachycardic, tachypneic, elevated troponins 0.04, lactic acid 2.7, chest x-ray noted for pneumonia, patient evaluated in the emergency room, Rales on auscultation of the lungs up to mid back with bilateral lower extremity pitting edema, family at the bedside, patient now be admitted for acute hypoxic respiratory failure most likely secondary to combination of acute on chronic diastolic congestive heart failure exacerbation and bilateral multifocal HCAP.  PAST MEDICAL HISTORY:   Past Medical History:  Diagnosis Date  . Allergic rhinitis, cause unspecified   . Anemia   . Asthma   . Atrial fibrillation ([place])    a. remote->no anticoagulation 2/2 h/o falls.  [name] CAD (coronary artery disease)    a. 2000 s/p MI/CABG x 4 (VG-PDA, VG-OM, VG-Diag, LIMA-LAD);  b. 2011 MI;  c. 08/2013 NSTEMI/Cath: LM 90-95, LAD 90/100p, LCX 99p, 133m, RCA 100ost, VG-PDA nl, VG-OM 100 (culprit w/ thrombus), VG-Diag nl, LIMA-LAD nl, EF 55-60%, mild inf HK, mod MR-->Med Rx.  . Carotid arterial disease ([place])    a. 05/2013 s/p L CEA.  . Chronic diastolic CHF (congestive heart failure) ([place])    a. 08/2013 Echo: EF 50-55%, no rwma, Gr2 DD, mod MR, mildly dil LA, PASP 22mmHg.  [name] Esophageal reflux   . History of TIA (transient ischemic attack)   . Hyperlipidemia    . Hypertension   . Hypothyroidism   . Myocardial infarction ([place])    08-2013  . Type II or unspecified type diabetes mellitus without mention of complication, not stated as uncontrolled   . Unspecified hearing loss     PAST SURGICAL HISTORY:  Past Surgical History:  Procedure Laterality Date  . ABDOMINAL HYSTERECTOMY    . Bladder tack     x 2  . CARDIAC CATHETERIZATION  09/07/1999   EF 25%. THERE IS SEVERE MITRAL ANNULAR CALIFICATION, WITH  3 + INSUFFICIENCY  . CARDIOVASCULAR STRESS TEST  06/30/2007   EF 63%, NO ISCHEMIA  . CAROTID ENDARTERECTOMY Left 05-19-13   cea  . CHOLECYSTECTOMY  10/2001  . CORONARY ARTERY BYPASS GRAFT  2000   4 vessel  . DILATION AND CURETTAGE OF UTERUS    . ENDARTERECTOMY Left 05/19/2013   Procedure: ENDARTERECTOMY CAROTID-LEFT;  Surgeon: Elam Dutch, MD;  Location: [facility];  Service: Vascular;  Laterality: Left;  . EYE SURGERY Bilateral    Cataract  . HEMORRHOID SURGERY    . LEFT HEART CATH  Dec. 2, 2014   With  Coronary Graft  Angiogram  . LEFT HEART CATHETERIZATION WITH CORONARY/GRAFT ANGIOGRAM  08/10/2013   Procedure: LEFT HEART CATHETERIZATION WITH Beatrix Fetters;  Surgeon: Peter M Martinique, MD;  Location: [facility] CATH LAB;  Service: Cardiovascular;;  . PARTIAL HYSTERECTOMY    . RECTOCELE REPAIR     x 2  . ROTATOR CUFF REPAIR    .  TONSILLECTOMY    . US ECHOCARDIOGRAPHY  01/17/2005   EF 55-60%    SOCIAL HISTORY:  Social History   Tobacco Use  . Smoking status: Never Smoker  . Smokeless tobacco: Never Used  Substance Use Topics  . Alcohol use: No    Alcohol/week: 0.0 oz    FAMILY HISTORY:  Family History  Problem Relation Age of Onset  . Heart attack Father        X's 3  . Heart disease Father   . Heart disease Son        before age 53  . Cancer Brother   . Hypertension Mother   . Alzheimer's disease Mother     DRUG ALLERGIES:  Allergies  Allergen Reactions  . Furosemide Other (See Comments)    Patient prefers not to  take med, weakness and frequent urination  . Penicillins Other (See Comments)    unknown    REVIEW OF SYSTEMS:   CONSTITUTIONAL: No fever,+ fatigue/weakness.  EYES: No blurred or double vision.  EARS, NOSE, AND THROAT: No tinnitus or ear pain.  RESPIRATORY: + cough/ shortness of breath, no wheezing or hemoptysis.  CARDIOVASCULAR: No chest pain, orthopnea, edema.  GASTROINTESTINAL: No nausea, vomiting, diarrhea or abdominal pain.  GENITOURINARY: No dysuria, hematuria.  ENDOCRINE: No polyuria, nocturia,  HEMATOLOGY: No anemia, easy bruising or bleeding SKIN: No rash or lesion. MUSCULOSKELETAL: No joint pain or arthritis.   NEUROLOGIC: No tingling, numbness, weakness.  PSYCHIATRY: No anxiety or depression.   MEDICATIONS AT HOME:  Prior to Admission medications   Medication Sig Start Date End Date Taking? Authorizing Provider  aspirin 81 MG EC tablet Take 81 mg by mouth daily.     Yes [provider]  atenolol (TENORMIN) 25 MG tablet Take 1 tablet (25 mg total) by mouth daily. 06/10/17  Yes Minna Merritts, MD  BIOTIN PO Take 1 capsule by mouth daily.    Yes [provider]  [place] 125 MCG tablet Take 1 tablet by mouth daily. 05/30/17  Yes [provider]  isosorbide mononitrate (IMDUR) 30 MG 24 hr tablet TAKE ONE TABLET EVERY DAY 02/04/17  Yes Gollan, Kathlene November, MD  levothyroxine (SYNTHROID, LEVOTHROID) 100 MCG tablet Take 1 tablet (100 mcg total) by mouth daily. 01/08/17  Yes Tonia Ghent, MD  losartan (COZAAR) 25 MG tablet TAKE ONE TABLET EVERY DAY 02/04/17  Yes Gollan, Kathlene November, MD  Multiple Vitamins-Minerals (PRESERVISION AREDS PO) Take 1 tablet by mouth daily.   Yes [provider]  nitroGLYCERIN (NITROSTAT) 0.4 MG SL tablet PLACE 1 TABLET UNDER THE TONGUE EVERY 5 MINUTES AS NEEDED FOR CHEST PAIN. 03/01/15  Yes Darlin Coco, MD  pantoprazole (PROTONIX) 40 MG tablet TAKE ONE TABLET EVERY DAY 04/09/17  Yes Tonia Ghent, MD  QUEtiapine  (SEROQUEL) 25 MG tablet TAKE ONE TABLET BY MOUTH EVERY EVENING AT BEDTIME 01/08/17  Yes Tonia Ghent, MD  simvastatin (ZOCOR) 40 MG tablet TAKE ONE TABLET EVERY DAY 05/30/17  Yes Tonia Ghent, MD  vitamin B-12 (CYANOCOBALAMIN) 1000 MCG tablet Take 1,000 mcg by mouth daily.    Yes [provider]      PHYSICAL EXAMINATION:   VITAL SIGNS: Blood pressure (!) 193/92, pulse (!) 103, temperature 99.2 F (37.3 C), temperature source Oral, resp. rate (!) 24, height 5\' 1"  (1.549 m), weight 63.5 kg (140 lb), SpO2 99 %.  GENERAL:  83 y.o.-year-old patient lying in the bed with no acute distress.  Frail appearing EYES: Pupils equal,  round, reactive to light and accommodation. No scleral icterus. Extraocular muscles intact.  HEENT: Head atraumatic, normocephalic. Oropharynx and nasopharynx clear.  NECK:  Supple, no jugular venous distention. No thyroid enlargement, no tenderness.  LUNGS: Rales on auscultation of the lungs bilaterally up to mid back . No use of accessory muscles of respiration -on BiPAP currently.  CARDIOVASCULAR: S1, S2 normal. No murmurs, rubs, or gallops.  ABDOMEN: Soft, nontender, nondistended. Bowel sounds present. No organomegaly or mass.  EXTREMITIES: Lower extremity bilateral pitting edema, cyanosis, or clubbing.  NEUROLOGIC: Cranial nerves II through XII are intact. MAES. Gait not checked.  PSYCHIATRIC: The patient is alert and oriented x 3.  SKIN: No obvious rash, lesion, or ulcer.   LABORATORY PANEL:   CBC Recent Labs  Lab 08/31/17 2100  WBC 14.9*  HGB 11.2*  HCT 33.6*  PLT 285  MCV 86.8  MCH 28.9  MCHC 33.3  RDW 15.0*   ------------------------------------------------------------------------------------------------------------------  Chemistries  Recent Labs  Lab 08/31/17 2100  NA 133*  K 4.2  CL 100*  CO2 25  GLUCOSE 154*  BUN 24*  CREATININE 1.05*  CALCIUM 8.6*    ------------------------------------------------------------------------------------------------------------------ estimated creatinine clearance is 26.8 mL/min (A) (by C-G formula based on SCr of 1.05 mg/dL (H)). ------------------------------------------------------------------------------------------------------------------ No results for input(s): TSH, T4TOTAL, T3FREE, THYROIDAB in the last 72 hours.  Invalid input(s): FREET3   Coagulation profile No results for input(s): INR, PROTIME in the last 168 hours. ------------------------------------------------------------------------------------------------------------------- No results for input(s): DDIMER in the last 72 hours. -------------------------------------------------------------------------------------------------------------------  Cardiac Enzymes Recent Labs  Lab 08/31/17 2100  TROPONINI 0.04*   ------------------------------------------------------------------------------------------------------------------ Invalid input(s): POCBNP  ---------------------------------------------------------------------------------------------------------------  Urinalysis    Component Value Date/Time   COLORURINE YELLOW (A) 08/12/2017 1741   APPEARANCEUR HAZY (A) 08/12/2017 1741   LABSPEC 1.013 08/12/2017 1741   PHURINE 5.0 08/12/2017 1741   GLUCOSEU NEGATIVE 08/12/2017 1741   HGBUR LARGE (A) 08/12/2017 1741   BILIRUBINUR NEGATIVE 08/12/2017 1741   BILIRUBINUR Negative 03/21/2011 1235   KETONESUR NEGATIVE 08/12/2017 1741   PROTEINUR 100 (A) 08/12/2017 1741   UROBILINOGEN 0.2 04/26/2015 1350   NITRITE NEGATIVE 08/12/2017 1741   LEUKOCYTESUR NEGATIVE 08/12/2017 1741     RADIOLOGY: Dg Chest Port 1 View  Result Date: 08/31/2017 CLINICAL DATA:  83 year old female with shortness of breath. EXAM: PORTABLE CHEST 1 VIEW COMPARISON:  Chest radiograph dated 08/16/2017 FINDINGS: Patchy areas of airspace opacity primarily in the  right upper lung field and to a lesser degree left mid and lower lung fields most consistent with multifocal pneumonia and less likely asymmetric edema. Clinical correlation is recommended. There is a small bilateral pleural effusions. No pneumothorax. Mild cardiomegaly. Median sternotomy wires and CABG vascular clips noted. There is atherosclerotic calcification of the aortic arch. Right shoulder anchor pins noted. No acute osseous pathology. IMPRESSION: Findings most consistent with multifocal pneumonia and less likely asymmetric edema. Clinical correlation is recommended. Small bilateral pleural effusions. Electronically Signed   By: Anner Crete M.D.   On: 08/31/2017 21:21    EKG: Orders placed or performed during the hospital encounter of 08/31/17  . ED EKG  . ED EKG  . EKG 12-Lead  . EKG 12-Lead    IMPRESSION AND PLAN: 1 acute hypoxic respiratory failure Most likely secondary to combined acute on chronic diastolic congestive heart failure exacerbation and multifocal HCAP Admit to stepdown unit, continue BiPAP, Bumex twice daily, rule out acute coronary syndrome with cardiac enzymes x3 sets, aspirin, atenolol, losartan, statin therapy, strict I&O monitoring, daily weights, pneumonia  protocol, empiric vancomycin/cefepime, supplemental oxygen as needed, and follow-up on cultures  2 acute on chronic diastolic congestive heart failure exacerbation Most recent echocardiogram noted for ejection fraction 50-55% in summer of last year Repeat echocardiogram and all of the plans as stated above  3 acute multifocal HCAP Plan of care as stated above  4 chronic benign essential hypertension Exacerbated by above Continue home regiment, as needed hydralazine for systolic blood pressure greater than 160, vitals per routine, make changes as per necessary  5 chronic diabetes mellitus type 2 sliding scale insulin With Accu-Cheks per routine for now, n.p.o. except for meds  Full code Condition  guarded Prognosis fair DVT prophylaxis with Lovenox subcu Disposition back to SNF 3-4 days barring any complications   All the records are reviewed and case discussed with ED provider. Management plans discussed with the patient, family and they are in agreement.  CODE STATUS: Code Status History    Date Active Date Inactive Code Status Order ID Comments User Context   08/12/2017 21:45 08/16/2017 21:55 Full Code [id number]  Nicholes Mango, MD Inpatient   04/22/2016 18:16 04/25/2016 17:36 Full Code [id number]  Lytle Butte, MD ED   02/20/2016 04:49 02/21/2016 18:31 Full Code [id number]  Toy Baker, MD ED   08/10/2013 13:25 08/12/2013 15:46 Full Code [id number]  Rogelia Mire, NP Inpatient       TOTAL TIME TAKING CARE OF THIS PATIENT: 45 minutes.    Avel Peace Salary M.D on 08/31/2017   Between 7am to 6pm - Pager - [phone]  After 6pm go to [email or web address] - password [email or web address] [facility]  Office  [phone]  CC: Primary care physician; Tonia Ghent, MD   Note: This dictation was prepared with Dragon dictation along with smaller phrase technology. Any transcriptional errors that result from this process are unintentional.

## 2017-08-31 NOTE — Progress Notes (Signed)
Pharmacy Antibiotic Note  Jill Howell is a 83 y.o. female admitted on 08/31/2017 with pneumonia.  Pharmacy has been consulted for vanc/cefepime dosing.  Plan: Patient received vanc 1g and cefepime 2g IV x 1 in ED  Will f/u w/ vanc 1g IV q24h w/ 8 hour stack dose Will draw a vanc trough 12/27 @ 0400 prior to 4th dose. Will start cefepime 2g IV q24h  Ke 0.0266 T1/2 26 ~ 24 hrs Goal trough 15 - 20 mcg/mL  Height: 5\' 1"  (154.9 cm) Weight: 140 lb (63.5 kg) IBW/kg (Calculated) : 47.8  Temp (24hrs), Avg:98.7 F (37.1 C), Min:98.2 F (36.8 C), Max:99.2 F (37.3 C)  Recent Labs  Lab 08/31/17 2100  WBC 14.9*  CREATININE 1.05*  LATICACIDVEN 2.7*    Estimated Creatinine Clearance: 26.8 mL/min (A) (by C-G formula based on SCr of 1.05 mg/dL (H)).    Allergies  Allergen Reactions  . Furosemide Other (See Comments)    Patient prefers not to take med, weakness and frequent urination  . Penicillins Other (See Comments)    unknown    Thank you for allowing pharmacy to be a part of this patient's care.  Tobie Lords, PharmD, BCPS Clinical Pharmacist 08/31/2017

## 2017-08-31 NOTE — Consult Note (Signed)
PULMONARY / CRITICAL CARE MEDICINE   Name: Jill Howell MRN: [id number] DOB: [birth year]    ADMISSION DATE:  08/31/2017   CONSULTATION DATE: 08/31/2017  REFERRING MD: Dr. Jerelyn Charles  Reason: Acute respiratory failure  HISTORY OF PRESENT ILLNESS:   This is a 83 year old female with a past medical history as indicated below who presented to the ED with complaints of shortness of breath.  Upon EMS arrival patient was dyspneic and hypoxic.  He will she was given 1 dose of albuterol and a Nitropaste was applied.  She was transferred to the ED on CPAP.  Upon arrival in the ED patient was placed on BiPAP.  Her chest x-ray showed multifocal pneumonia as well as pulmonary vascular congestion.  She is being admitted to the ICU for acute respiratory failure secondary to pneumonia and possible CHF exacerbation.  Patient is awake and offers no complaints.  She reports moderate improvement in her respiratory status. Of note patient was recently hospitalized 08/12/2017 following a fall, GI bleed and rhabdomyolysis and was discharged to a skilled nursing facility on August 16, 2017.  PAST MEDICAL HISTORY :  She  has a past medical history of Allergic rhinitis, cause unspecified, Anemia, Asthma, Atrial fibrillation (H[facility]), CAD (coronary artery disease), Carotid arterial disease ([place]), Chronic diastolic CHF (congestive heart failure) ([place]), Esophageal reflux, History of TIA (transient ischemic attack), Hyperlipidemia, Hypertension, Hypothyroidism, Myocardial infarction ([place]), Type II or unspecified type diabetes mellitus without mention of complication, not stated as uncontrolled, and Unspecified hearing loss.  PAST SURGICAL HISTORY: She  has a past surgical history that includes Tonsillectomy; Partial hysterectomy; Dilation and curettage of uterus; Bladder tack; Rectocele repair; Cholecystectomy (10/2001); Rotator cuff repair; Coronary artery bypass graft (2000); Hemorrhoid surgery; Cardiac catheterization  (09/07/1999); US ECHOCARDIOGRAPHY (01/17/2005); Cardiovascular stress test (06/30/2007); Eye surgery (Bilateral); Endarterectomy (Left, 05/19/2013); Carotid endarterectomy (Left, 05-19-13); Left heart cath (Dec. 2, 2014); Abdominal hysterectomy; and left heart catheterization with coronary/graft angiogram (08/10/2013).  Allergies  Allergen Reactions  . Furosemide Other (See Comments)    Patient prefers not to take med, weakness and frequent urination  . Penicillins Other (See Comments)    unknown    No current facility-administered medications on file prior to encounter.    Current Outpatient Medications on File Prior to Encounter  Medication Sig  . aspirin 81 MG EC tablet Take 81 mg by mouth daily.    [name] atenolol (TENORMIN) 25 MG tablet Take 1 tablet (25 mg total) by mouth daily.  [name] BIOTIN PO Take 1 capsule by mouth daily.   [name] DIGOX 125 MCG tablet Take 1 tablet by mouth daily.  . isosorbide mononitrate (IMDUR) 30 MG 24 hr tablet TAKE ONE TABLET EVERY DAY  . levothyroxine (SYNTHROID, LEVOTHROID) 100 MCG tablet Take 1 tablet (100 mcg total) by mouth daily.  [name] losartan (COZAAR) 25 MG tablet TAKE ONE TABLET EVERY DAY  . Multiple Vitamins-Minerals (PRESERVISION AREDS PO) Take 1 tablet by mouth daily.  . nitroGLYCERIN (NITROSTAT) 0.4 MG SL tablet PLACE 1 TABLET UNDER THE TONGUE EVERY 5 MINUTES AS NEEDED FOR CHEST PAIN.  [name] pantoprazole (PROTONIX) 40 MG tablet TAKE ONE TABLET EVERY DAY  . QUEtiapine (SEROQUEL) 25 MG tablet TAKE ONE TABLET BY MOUTH EVERY EVENING AT BEDTIME  . simvastatin (ZOCOR) 40 MG tablet TAKE ONE TABLET EVERY DAY  . vitamin B-12 (CYANOCOBALAMIN) 1000 MCG tablet Take 1,000 mcg by mouth daily.     FAMILY HISTORY:  Her indicated that her mother is deceased. She indicated that her father is deceased.  She indicated that her brother is deceased. She indicated that all of her four sons are alive.   SOCIAL HISTORY: She  reports that  has never smoked. she has never used smokeless  tobacco. She reports that she does not drink alcohol or use drugs.  REVIEW OF SYSTEMS:   Unable to obtain as patient is currently on continuous BiPAP  SUBJECTIVE:   VITAL SIGNS: BP (!) 193/92   Pulse (!) 103   Temp 99.2 F (37.3 C) (Oral)   Resp (!) 24   Ht 5\' 1"  (1.549 m)   Wt 63.5 kg (140 lb)   SpO2 99%   BMI 26.45 kg/m   HEMODYNAMICS:    VENTILATOR SETTINGS:    INTAKE / OUTPUT: No intake/output data recorded.  PHYSICAL EXAMINATION: General: No acute distress Neuro: Awake and alert to self follows some basic commands moves all extremities HEENT: PERRLA, neck is supple with good range of motion no JVD Cardiovascular: Apical pulse regular, S1-S2, no murmur regurg or gallop Lungs: Continuous BiPAP, moderate increase in work of breathing, bilateral breath sounds, diminished in the bases, no wheezes rhonchi or crackles Abdomen: Nondistended, soft, positive bowel sounds in all 4 quadrants Musculoskeletal: No joint deformities, positive range of motion in upper and lower extremities Skin: Warm and dry without rash or lesions LABS:  BMET Recent Labs  Lab 08/31/17 2100  NA 133*  K 4.2  CL 100*  CO2 25  BUN 24*  CREATININE 1.05*  GLUCOSE 154*   Electrolytes Recent Labs  Lab 08/31/17 2100  CALCIUM 8.6*    CBC Recent Labs  Lab 08/31/17 2100  WBC 14.9*  HGB 11.2*  HCT 33.6*  PLT 285    Coag's No results for input(s): APTT, INR in the last 168 hours.  Sepsis Markers Recent Labs  Lab 08/31/17 2100  LATICACIDVEN 2.7*    ABG No results for input(s): PHART, PCO2ART, PO2ART in the last 168 hours.  Liver Enzymes No results for input(s): AST, ALT, ALKPHOS, BILITOT, ALBUMIN in the last 168 hours.  Cardiac Enzymes Recent Labs  Lab 08/31/17 2100  TROPONINI 0.04*    Glucose No results for input(s): GLUCAP in the last 168 hours.  Imaging Dg Chest Port 1 View  Result Date: 08/31/2017 CLINICAL DATA:  83 year old female with shortness of breath.  EXAM: PORTABLE CHEST 1 VIEW COMPARISON:  Chest radiograph dated 08/16/2017 FINDINGS: Patchy areas of airspace opacity primarily in the right upper lung field and to a lesser degree left mid and lower lung fields most consistent with multifocal pneumonia and less likely asymmetric edema. Clinical correlation is recommended. There is a small bilateral pleural effusions. No pneumothorax. Mild cardiomegaly. Median sternotomy wires and CABG vascular clips noted. There is atherosclerotic calcification of the aortic arch. Right shoulder anchor pins noted. No acute osseous pathology. IMPRESSION: Findings most consistent with multifocal pneumonia and less likely asymmetric edema. Clinical correlation is recommended. Small bilateral pleural effusions. Electronically Signed   By: Anner Crete M.D.   On: 08/31/2017 21:21    STUDIES:  None  CULTURES: Blood cultures x2 MRSA screen negative  ANTIBIOTICS: Aztreonam-08/31/2017 Cefepime Vancomycin  SIGNIFICANT EVENTS: 08/31/2017: Admitted  LINES/TUBES: PIV's  DISCUSSION: 83 year old female presenting with acute hypoxemic respiratory failure secondary to acute CHF exacerbation and HCAP  ASSESSMENT Acute hypoxic respiratory failure Acute CHF exacerbation HCAP Hypertensive emergency H/O hypertension, T2DM, hyperlipidemia, atrial fibrillation,  And CAD s/p MI  PLAN BiPAP as tolerated and titrate to nasal canula as tolerated Hemodynamics per ICU protocol Abx Hold  off on IV fluids and diuresis Resume home medications GI and DVT prophylaxis Palliative care consult  FAMILY  - Updates: Family not at bedside.  Address goals of care with family when available  - Inter-disciplinary family meet or Palliative Care meeting due by:  day 7   Magdalene S. [facility] ANP-BC Pulmonary and [place] Pager [phone] or [phone]  08/31/2017, 10:45 PM

## 2017-08-31 NOTE — ED Triage Notes (Signed)
Pt presents via EMS from [facility] c/o SOB per EMS report. Pt presents on CPAP. Albuterol x1 and 2in nitro paste applied. Quale MD at bedside.

## 2017-08-31 NOTE — ED Notes (Signed)
Placed on BiPAP by RT

## 2017-08-31 NOTE — ED Notes (Signed)
ED Provider at bedside. 

## 2017-09-01 ENCOUNTER — Inpatient Hospital Stay: Payer: Medicare Other

## 2017-09-01 ENCOUNTER — Inpatient Hospital Stay: Admit: 2017-09-01 | Payer: Medicare Other

## 2017-09-01 DIAGNOSIS — Z515 Encounter for palliative care: Secondary | ICD-10-CM

## 2017-09-01 DIAGNOSIS — J9690 Respiratory failure, unspecified, unspecified whether with hypoxia or hypercapnia: Secondary | ICD-10-CM

## 2017-09-01 DIAGNOSIS — Z66 Do not resuscitate: Secondary | ICD-10-CM

## 2017-09-01 DIAGNOSIS — J9601 Acute respiratory failure with hypoxia: Secondary | ICD-10-CM

## 2017-09-01 DIAGNOSIS — I5031 Acute diastolic (congestive) heart failure: Secondary | ICD-10-CM

## 2017-09-01 DIAGNOSIS — J189 Pneumonia, unspecified organism: Secondary | ICD-10-CM

## 2017-09-01 LAB — CBC
HEMATOCRIT: 30.8 % — AB (ref 35.0–47.0)
Hemoglobin: 10.2 g/dL — ABNORMAL LOW (ref 12.0–16.0)
MCH: 29 pg (ref 26.0–34.0)
MCHC: 33.2 g/dL (ref 32.0–36.0)
MCV: 87.4 fL (ref 80.0–100.0)
PLATELETS: 252 10*3/uL (ref 150–440)
RBC: 3.52 MIL/uL — ABNORMAL LOW (ref 3.80–5.20)
RDW: 14.8 % — AB (ref 11.5–14.5)
WBC: 17.8 10*3/uL — AB (ref 3.6–11.0)

## 2017-09-01 LAB — BASIC METABOLIC PANEL
Anion gap: 9 (ref 5–15)
BUN: 20 mg/dL (ref 6–20)
CHLORIDE: 100 mmol/L — AB (ref 101–111)
CO2: 25 mmol/L (ref 22–32)
CREATININE: 0.95 mg/dL (ref 0.44–1.00)
Calcium: 8.2 mg/dL — ABNORMAL LOW (ref 8.9–10.3)
GFR calc Af Amer: 57 mL/min — ABNORMAL LOW (ref >60)
GFR calc non Af Amer: 49 mL/min — ABNORMAL LOW (ref >60)
GLUCOSE: 159 mg/dL — AB (ref 65–99)
POTASSIUM: 4 mmol/L (ref 3.5–5.1)
SODIUM: 134 mmol/L — AB (ref 135–145)

## 2017-09-01 LAB — TROPONIN I
TROPONIN I: 0.43 ng/mL — AB (ref <0.03)
Troponin I: 0.14 ng/mL (ref <0.03)
Troponin I: 0.22 ng/mL (ref <0.03)
Troponin I: 0.31 ng/mL (ref <0.03)
Troponin I: 0.4 ng/mL (ref <0.03)

## 2017-09-01 LAB — LACTIC ACID, PLASMA: Lactic Acid, Venous: 2 mmol/L (ref 0.5–1.9)

## 2017-09-01 LAB — BRAIN NATRIURETIC PEPTIDE: B Natriuretic Peptide: 2822 pg/mL — ABNORMAL HIGH (ref 0.0–100.0)

## 2017-09-01 LAB — MRSA PCR SCREENING: MRSA by PCR: NEGATIVE

## 2017-09-01 MED ORDER — FUROSEMIDE 10 MG/ML IJ SOLN
40.0000 mg | Freq: Once | INTRAMUSCULAR | Status: AC
  Administered 2017-09-01: 40 mg via INTRAVENOUS
  Filled 2017-09-01: qty 4

## 2017-09-01 MED ORDER — ISOSORBIDE MONONITRATE ER 60 MG PO TB24
60.0000 mg | ORAL_TABLET | Freq: Every day | ORAL | Status: DC
  Administered 2017-09-02 – 2017-09-05 (×4): 60 mg via ORAL
  Filled 2017-09-01 (×5): qty 1

## 2017-09-01 MED ORDER — LOSARTAN POTASSIUM 50 MG PO TABS
50.0000 mg | ORAL_TABLET | Freq: Every day | ORAL | Status: DC
  Administered 2017-09-01: 50 mg via ORAL
  Filled 2017-09-01: qty 1

## 2017-09-01 MED ORDER — HYDRALAZINE HCL 20 MG/ML IJ SOLN: 10.0000 mg | INTRAMUSCULAR | Status: DC | PRN

## 2017-09-01 NOTE — Progress Notes (Signed)
Patient has been on  bipap this shift and now tolerating it. Was complaining in the beginning of the admission that she could not breathe. On ABX for pneumonia and ST on the monitor. No pain reported. Hard to understand while wearing bipap. Troponin elevated, could be demand ischemia with the elevated BNP. No chest pain reported. Nitro was applied in ED.

## 2017-09-01 NOTE — Care Management Note (Signed)
Case Management Note  Patient Details  Name: Jill Howell MRN: [id number] Date of Birth: 1919/07/26  Subjective/Objective:                 Admitted from [facility] with acute respiratory failure due to CHF and pneumonia.  Admitted top icu stepdown due to need for continuous bipap.  Palliative consult is pending.  Patient is full code  Action/Plan:  Notified CSW  Expected Discharge Date:                  Expected Discharge Plan:     In-House Referral:     Discharge planning Services     Post Acute Care Choice:    Choice offered to:     DME Arranged:    DME Agency:     HH Arranged:    HH Agency:     Status of Service:     If discussed at [employer], dates discussed:    Additional Comments:  Katrina Stack, RN 09/01/2017, 9:31 AM

## 2017-09-01 NOTE — Progress Notes (Signed)
[place] [facility] [place] NAME: Jill Howell    MR#:  [id number]  DATE OF BIRTH:  05-23-1919  SUBJECTIVE:  CHIEF COMPLAINT:   Chief Complaint  Patient presents with  . Shortness of Breath   Off bipap and on [place]. Still feels very short of breath.  Afebrile today.  REVIEW OF SYSTEMS:    Review of Systems  Constitutional: Positive for malaise/fatigue. Negative for chills and fever.  HENT: Negative for sore throat.   Eyes: Negative for blurred vision, double vision and pain.  Respiratory: Positive for cough and shortness of breath. Negative for hemoptysis and wheezing.   Cardiovascular: Negative for chest pain, palpitations, orthopnea and leg swelling.  Gastrointestinal: Negative for abdominal pain, constipation, diarrhea, heartburn, nausea and vomiting.  Genitourinary: Negative for dysuria and hematuria.  Musculoskeletal: Positive for myalgias. Negative for back pain and joint pain.  Skin: Negative for rash.  Neurological: Positive for weakness. Negative for sensory change, speech change, focal weakness and headaches.  Endo/Heme/Allergies: Does not bruise/bleed easily.  Psychiatric/Behavioral: Negative for depression. The patient is not nervous/anxious.     DRUG ALLERGIES:   Allergies  Allergen Reactions  . Furosemide Other (See Comments)    Patient prefers not to take med, weakness and frequent urination  . Penicillins Other (See Comments)    unknown    VITALS:  Blood pressure 117/61, pulse 82, temperature 99.2 F (37.3 C), temperature source Axillary, resp. rate (!) 33, height 5\' 1"  (1.549 m), weight 62.3 kg (137 lb 5.6 oz), SpO2 97 %.  PHYSICAL EXAMINATION:   Physical Exam  GENERAL:  83 y.o.-year-old patient lying in the bed with resp distress EYES: Pupils equal, round, reactive to light and accommodation. No scleral icterus. Extraocular muscles intact.  HEENT: Head atraumatic, normocephalic. Oropharynx and nasopharynx clear.   NECK:  Supple, no jugular venous distention. No thyroid enlargement, no tenderness.  LUNGS: b/l coarse breath sounds CARDIOVASCULAR: S1, S2 normal. No murmurs, rubs, or gallops.  ABDOMEN: Soft, nontender, nondistended. Bowel sounds present. No organomegaly or mass.  EXTREMITIES: No cyanosis, clubbing or edema b/l.    NEUROLOGIC: Moves all 4 extremities PSYCHIATRIC: The patient is alert and awake. Anxious SKIN: No obvious rash, lesion, or ulcer.   LABORATORY PANEL:   CBC Recent Labs  Lab 09/01/17 0415  WBC 17.8*  HGB 10.2*  HCT 30.8*  PLT 252   ------------------------------------------------------------------------------------------------------------------ Chemistries  Recent Labs  Lab 09/01/17 0415  NA 134*  K 4.0  CL 100*  CO2 25  GLUCOSE 159*  BUN 20  CREATININE 0.95  CALCIUM 8.2*   ------------------------------------------------------------------------------------------------------------------  Cardiac Enzymes Recent Labs  Lab 09/01/17 1026  TROPONINI 0.40*   ------------------------------------------------------------------------------------------------------------------  RADIOLOGY:  Dg Chest Port 1 View  Result Date: 09/01/2017 CLINICAL DATA:  Respiratory failure EXAM: PORTABLE CHEST 1 VIEW COMPARISON:  August 31, 2017 FINDINGS: Widespread airspace consolidation is noted with relative increase in consolidation in both upper lobes compared to 1 day prior. There is a small pleural effusion on each side. There is cardiomegaly with pulmonary venous hypertension. There is aortic atherosclerosis. Patient is status post coronary artery bypass grafting. No adenopathy. There is postoperative change involving each lateral clavicle. There is postoperative change in the right shoulder region. IMPRESSION: No spread airspace consolidation with increase in both upper lobes compared to 1 day prior. Changes elsewhere stable. Underlying pulmonary venous hypertension. Suspect  multifocal pneumonia, although there [place] be a degree of alveolar edema. Both entities Lal exist concurrently. Electronically Signed  By: Lowella Grip III M.D.   On: 09/01/2017 08:26   Dg Chest Port 1 View  Result Date: 08/31/2017 CLINICAL DATA:  83 year old female with shortness of breath. EXAM: PORTABLE CHEST 1 VIEW COMPARISON:  Chest radiograph dated 08/16/2017 FINDINGS: Patchy areas of airspace opacity primarily in the right upper lung field and to a lesser degree left mid and lower lung fields most consistent with multifocal pneumonia and less likely asymmetric edema. Clinical correlation is recommended. There is a small bilateral pleural effusions. No pneumothorax. Mild cardiomegaly. Median sternotomy wires and CABG vascular clips noted. There is atherosclerotic calcification of the aortic arch. Right shoulder anchor pins noted. No acute osseous pathology. IMPRESSION: Findings most consistent with multifocal pneumonia and less likely asymmetric edema. Clinical correlation is recommended. Small bilateral pleural effusions. Electronically Signed   By: Anner Crete M.D.   On: 08/31/2017 21:21     ASSESSMENT AND PLAN:   * Acute hypoxic respiratory failure due to Pneumonia and acute on chronic diastolic chf IV abx, Nebs, Oxygen, lasix Monitor I/os Waiting for Cx  * Sepsis POA  * Elevated troponin likely due to demand ischemia  All the records are reviewed and case discussed with Care Management/Social Workerr. Management plans discussed with the patient, family and they are in agreement.  CODE STATUS: DNR  DVT Prophylaxis: SCDs  TOTAL TIME TAKING CARE OF THIS PATIENT: 35 minutes.   POSSIBLE D/C IN 2-3 DAYS, DEPENDING ON CLINICAL CONDITION.  [name] M.D on 09/01/2017 at 12:46 PM  Between 7am to 6pm - Pager - [phone]  After 6pm go to [email or web address] - password [email or web address] [facility]  Office  [phone]  CC: Primary care physician; Tonia Ghent, MD  Note: This dictation was prepared with Dragon dictation along with smaller phrase technology. Any transcriptional errors that result from this process are unintentional.

## 2017-09-01 NOTE — Progress Notes (Addendum)
Pt with increased WOB, RR upper 30s, pt verbalized, "I cannot seem to catch my breath", lung sounds coarse rhonchi, HR 130s- 40 lasix ordered. Will monitor closely.

## 2017-09-01 NOTE — Consult Note (Signed)
Consultation Note Date: 09/01/2017   Patient Name: Jill Howell  DOB: 01-26-1919  MRN: [id number]  Age / Sex: 83 y.o., female  PCP: Tonia Ghent, MD Referring Physician: Hillary Bow, MD  Reason for Consultation: Establishing goals of care  HPI/Patient Profile: 83 y.o. female  with past medical history of CHF, afib, TIA, Asthma, dementia who was admitted on 08/31/2017 with hypoxic respiratory failure. CXR showed multifocal pneumonia and pulmonary vascular congestion.  She is currently being treated in the ICU requiring intermittent Bipap.  Clinical Assessment and Goals of Care:  I have reviewed medical records including EPIC notes, labs and imaging, received report from the bedside RN, assessed the patient and then talked with her son [name] on the phone to discuss diagnosis prognosis, [place], EOL wishes, disposition and options.  I introduced Palliative Medicine as specialized medical care for people living with serious illness. It focuses on providing relief from the symptoms and stress of a serious illness. The goal is to improve quality of life for both the patient and the family.  We discussed a brief life review of the patient. She currently lives on the same property with her youngest son [name].  She is independent, walking, lives alone.  She stopped driving and therefore had to stop going to church last year.  She is the mother of 4 boys.  We discussed their current illness and what it means in the larger context of their on-going co-morbidities.  Natural disease trajectory and expectations at EOL were discussed.  After talking with Dr. Alva Garnet I explained to [name] that his mother had both heart failure and pneumonia.  Her heart failure is likely caused by mitral valve disease.  So while we can improve his mother's current health status this type of difficulty will likely happen again.  She is fragile  and becoming more delicate to manage.  [name] agreed and understood.    We discussed code status.  [name] agrees with DNR.  He stated mom always said she wants to live as long as she can, but I don't want her to suffer.  He requests DNR with full scope treatment - including Bipap if needed.  The difference between aggressive medical intervention and comfort care was considered in light of the patient's goals of care.  She is not yet ready for comfort care.  She is full scope treatment with DNR.  The family was encouraged to call with questions or concerns.   Primary Decision Maker:  PATIENT and son [name].    SUMMARY OF RECOMMENDATIONS    Change code status to DNR - with full scope treatment including Bipap.  PMT unavailable on 12/25.  Will plan to check on her on 12/26.  Code Status/Advance Care Planning:  DNR   Symptom Management:   Per CCM.  PMT would support using very low dose morphine (1 mg IV) for dyspnea for comfort, but will defer to CCM.   Psycho-social/Spiritual:   Desire for further Chaplaincy support: yes requested.  Prognosis:  Unable to determine  at this point.    Discharge Planning: To Be Determined      Primary Diagnoses: Present on Admission: . HCAP (healthcare-associated pneumonia)   I have reviewed the medical record, interviewed the patient and family, and examined the patient. The following aspects are pertinent.  Past Medical History:  Diagnosis Date  . Allergic rhinitis, cause unspecified   . Anemia   . Asthma   . Atrial fibrillation ([place])    a. remote->no anticoagulation 2/2 h/o falls.  [name] CAD (coronary artery disease)    a. 2000 s/p MI/CABG x 4 (VG-PDA, VG-OM, VG-Diag, LIMA-LAD);  b. 2011 MI;  c. 08/2013 NSTEMI/Cath: LM 90-95, LAD 90/100p, LCX 99p, 154m, RCA 100ost, VG-PDA nl, VG-OM 100 (culprit w/ thrombus), VG-Diag nl, LIMA-LAD nl, EF 55-60%, mild inf HK, mod MR-->Med Rx.  . Carotid arterial disease ([place])    a. 05/2013 s/p L CEA.  .  Chronic diastolic CHF (congestive heart failure) ([place])    a. 08/2013 Echo: EF 50-55%, no rwma, Gr2 DD, mod MR, mildly dil LA, PASP 60mmHg.  [name] Esophageal reflux   . History of TIA (transient ischemic attack)   . Hyperlipidemia   . Hypertension   . Hypothyroidism   . Myocardial infarction ([place])    08-2013  . Type II or unspecified type diabetes mellitus without mention of complication, not stated as uncontrolled   . Unspecified hearing loss    Social History   Socioeconomic History  . Marital status: Widowed    Spouse name: None  . Number of children: 4  . Years of education: None  . Highest education level: None  Social Needs  . Financial resource strain: None  . Food insecurity - worry: None  . Food insecurity - inability: None  . Transportation needs - medical: None  . Transportation needs - non-medical: None  Occupational History  . Occupation: Retired  Tobacco Use  . Smoking status: Never Smoker  . Smokeless tobacco: Never Used  Substance and Sexual Activity  . Alcohol use: No    Alcohol/week: 0.0 oz  . Drug use: No  . Sexual activity: Not Currently  Other Topics Concern  . None  Social History Narrative   Widowed   4 sons   Retired   Family History  Problem Relation Age of Onset  . Heart attack Father        X's 3  . Heart disease Father   . Heart disease Son        before age 49  . Cancer Brother   . Hypertension Mother   . Alzheimer's disease Mother    Scheduled Meds: . aspirin EC  81 mg Oral Daily  . atenolol  25 mg Oral Daily  . bumetanide  1 mg Oral BID  . digoxin  125 mcg Oral Daily  . enoxaparin (LOVENOX) injection  30 mg Subcutaneous Q24H  . isosorbide mononitrate  60 mg Oral Daily  . levothyroxine  100 mcg Oral QAC breakfast  . losartan  50 mg Oral Daily  . pantoprazole  40 mg Oral Daily  . QUEtiapine  25 mg Oral QHS  . simvastatin  40 mg Oral Daily  . sodium chloride flush  3 mL Intravenous Q12H  . vitamin B-12  1,000 mcg Oral Daily    Continuous Infusions: . sodium chloride    . ceFEPime (MAXIPIME) IV Stopped (09/01/17 0000)   PRN Meds:.sodium chloride, acetaminophen **OR** acetaminophen, hydrALAZINE, HYDROcodone-acetaminophen, nitroGLYCERIN, [DISCONTINUED] ondansetron **OR** ondansetron (ZOFRAN) IV, polyethylene glycol, sodium chloride flush  Allergies  Allergen Reactions  . Furosemide Other (See Comments)    Patient prefers not to take med, weakness and frequent urination  . Penicillins Other (See Comments)    unknown   Review of Systems patient very short of breath - difficult to give ROS  Physical Exam  Elderly, frail, smiling CV tachy Lung increased work of breathing on 3L at 50% Abdomen protuberant, NT Extremities 1+ edema bilaterally in LE  Vital Signs: BP (!) 138/93   Pulse (!) 108   Temp 99.2 F (37.3 C) (Axillary)   Resp (!) 36   Ht 5\' 1"  (1.549 m)   Wt 62.3 kg (137 lb 5.6 oz)   SpO2 96%   BMI 25.95 kg/m  Pain Assessment: No/denies pain   Pain Score: 0-No pain   SpO2: SpO2: 96 % O2 Device:SpO2: 96 % O2 Flow Rate: .O2 Flow Rate (L/min): 3 L/min  IO: Intake/output summary:   Intake/Output Summary (Last 24 hours) at 09/01/2017 0955 Last data filed at 09/01/2017 0800 Gross per 24 hour  Intake 300 ml  Output 600 ml  Net -300 ml    LBM: Last BM Date: (unknown) Baseline Weight: Weight: 63.5 kg (140 lb) Most recent weight: Weight: 62.3 kg (137 lb 5.6 oz)     Palliative Assessment/Data: 40%     Time In: 9:00 Time Out: 9:50 Time Total: 50 min. Greater than 50%  of this time was spent counseling and coordinating care related to the above assessment and plan.  Signed by: Florentina Jenny, PA-C Palliative Medicine Pager: [phone]  Please contact Palliative Medicine Team phone at [phone] for questions and concerns.  For individual provider: See Shea Evans

## 2017-09-01 NOTE — Progress Notes (Signed)
Pt has remained alert and oriented with no c/o pain. Pt transitioned to [facility] from bipap 50% this am. Pt subsequently developed SOB with increased WOB, tachyapnea in upper 30s-pt responded well to lasix 40mg  ordered x1; pt transitioned back to [facility] and had maintained until about 1745 -pt back on bipap for tachypnea and increased WOB-sched bumex given. Pt has maintained NSR-ST to 130s with dyspnea. Pt has little appetite-minimal intake. Echo is still pending. External cath removed d/t cognizant retention. Pt is able to verbalize need for toileting and would not void into external cath.

## 2017-09-02 ENCOUNTER — Other Ambulatory Visit: Payer: Self-pay

## 2017-09-02 ENCOUNTER — Inpatient Hospital Stay: Payer: Medicare Other

## 2017-09-02 ENCOUNTER — Inpatient Hospital Stay (HOSPITAL_COMMUNITY)
Admit: 2017-09-02 | Discharge: 2017-09-02 | Disposition: A | Discharge status: Home / Self Care | Payer: Medicare Other | Attending: Family Medicine | Admitting: Family Medicine

## 2017-09-02 DIAGNOSIS — I361 Nonrheumatic tricuspid (valve) insufficiency: Secondary | ICD-10-CM

## 2017-09-02 DIAGNOSIS — J810 Acute pulmonary edema: Secondary | ICD-10-CM

## 2017-09-02 LAB — CBC
HEMATOCRIT: 27.9 % — AB (ref 35.0–47.0)
HEMOGLOBIN: 9.3 g/dL — AB (ref 12.0–16.0)
MCH: 29.1 pg (ref 26.0–34.0)
MCHC: 33.3 g/dL (ref 32.0–36.0)
MCV: 87.3 fL (ref 80.0–100.0)
Platelets: 261 10*3/uL (ref 150–440)
RBC: 3.2 MIL/uL — AB (ref 3.80–5.20)
RDW: 14.8 % — ABNORMAL HIGH (ref 11.5–14.5)
WBC: 15.9 10*3/uL — ABNORMAL HIGH (ref 3.6–11.0)

## 2017-09-02 LAB — ECHOCARDIOGRAM COMPLETE
CHL CUP DOP CALC LVOT VTI: 16.2 cm
CHL CUP MV M VEL: 99.4
E decel time: 278 msec
E/e' ratio: 24.7
FS: 11 % — AB (ref 28–44)
HEIGHTINCHES: 61 in
IVS/LV PW RATIO, ED: 1.15
LA ID, A-P, ES: 50 mm
LA diam index: 3.03 cm/m2
LA vol A4C: 69.7 ml
LA vol: 79.8 mL
LAVOLIN: 48.3 mL/m2
LEFT ATRIUM END SYS DIAM: 50 mm
LV E/e' medial: 24.7
LV E/e'average: 24.7
LV PW d: 10.5 mm — AB (ref 0.6–1.1)
LV e' LATERAL: 6.6 cm/s
LVOT MV VTI INDEX: 0.71 cm2/m2
LVOT MV VTI: 1.18
LVOT SV: 51 mL
LVOT area: 3.14 cm2
LVOT peak vel: 72.7 cm/s
LVOTD: 20 mm
MV Annulus VTI: 43.2 cm
MV Dec: 278
MV Peak grad: 11 mmHg
MV VTI: 191 cm
MV pk A vel: 133 m/s
MV pk E vel: 163 m/s
MVAP: 2.72 cm2
MVG: 5 mmHg
MVSPHT: 81 ms
RV LATERAL S' VELOCITY: 10.4 cm/s
RV TAPSE: 18.7 mm
TDI e' lateral: 6.6
TDI e' medial: 2.85
Weight: 2197.55 oz

## 2017-09-02 LAB — COMPREHENSIVE METABOLIC PANEL
ALK PHOS: 94 U/L (ref 38–126)
ALT: 21 U/L (ref 14–54)
AST: 26 U/L (ref 15–41)
Albumin: 2.9 g/dL — ABNORMAL LOW (ref 3.5–5.0)
Anion gap: 10 (ref 5–15)
BILIRUBIN TOTAL: 1.2 mg/dL (ref 0.3–1.2)
BUN: 29 mg/dL — ABNORMAL HIGH (ref 6–20)
CALCIUM: 8.1 mg/dL — AB (ref 8.9–10.3)
CO2: 27 mmol/L (ref 22–32)
CREATININE: 1.2 mg/dL — AB (ref 0.44–1.00)
Chloride: 97 mmol/L — ABNORMAL LOW (ref 101–111)
GFR calc Af Amer: 43 mL/min — ABNORMAL LOW (ref >60)
GFR calc non Af Amer: 37 mL/min — ABNORMAL LOW (ref >60)
GLUCOSE: 106 mg/dL — AB (ref 65–99)
Potassium: 3.8 mmol/L (ref 3.5–5.1)
SODIUM: 134 mmol/L — AB (ref 135–145)
TOTAL PROTEIN: 6.5 g/dL (ref 6.5–8.1)

## 2017-09-02 LAB — HIV ANTIBODY (ROUTINE TESTING W REFLEX): HIV SCREEN 4TH GENERATION: NONREACTIVE

## 2017-09-02 LAB — PROCALCITONIN: PROCALCITONIN: 2.55 ng/mL

## 2017-09-02 MED ORDER — LOSARTAN POTASSIUM 25 MG PO TABS
25.0000 mg | ORAL_TABLET | Freq: Every day | ORAL | Status: DC
  Administered 2017-09-03 – 2017-09-05 (×3): 25 mg via ORAL
  Filled 2017-09-02 (×3): qty 1

## 2017-09-02 MED ORDER — MAGNESIUM HYDROXIDE NICU ORAL SYRINGE 400 MG/5 ML
30.0000 mL | Freq: Every day | ORAL | Status: DC | PRN
  Filled 2017-09-02 (×2): qty 30

## 2017-09-02 MED ORDER — PHENYLEPHRINE HCL 10 MG/ML IJ SOLN
0.0000 ug/min | INTRAMUSCULAR | Status: DC
  Administered 2017-09-02 (×2): 20 ug/min via INTRAVENOUS
  Administered 2017-09-02: 40 ug/min via INTRAVENOUS
  Filled 2017-09-02 (×2): qty 10
  Filled 2017-09-02: qty 1

## 2017-09-02 NOTE — Progress Notes (Addendum)
Much improved No distress No new complaints Denies dyspnea and pain  Vitals:   09/02/17 0700 09/02/17 0800 09/02/17 0900 09/02/17 1000  BP: (!) 114/48 134/62 (!) 126/53 127/72  Pulse: 65 74 81 83  Resp: 19 (!) 26 (!) 25 (!) 29  Temp:  97.7 F (36.5 C)    TempSrc:  Axillary    SpO2: 99% 94% 97% 93%  Weight:      Height:         Gen: NAD HEENT: NCAT, sclerae white Neck: NJVP not visualized Lungs: full BS, normal percussion note, bibasilar crackles Cardiovascular: Reg, no M noted Abdomen: Soft, NT, +BS Ext: no C/C/E Neuro: grossly intact   BMP Latest Ref Rng & Units 09/02/2017 09/01/2017 08/31/2017  Glucose 65 - 99 mg/dL 106(H) 159(H) 154(H)  BUN 6 - 20 mg/dL 29(H) 20 24(H)  Creatinine 0.44 - 1.00 mg/dL 1.20(H) 0.95 1.05(H)  Sodium 135 - 145 mmol/L 134(L) 134(L) 133(L)  Potassium 3.5 - 5.1 mmol/L 3.8 4.0 4.2  Chloride 101 - 111 mmol/L 97(L) 100(L) 100(L)  CO2 22 - 32 mmol/L 27 25 25   Calcium 8.9 - 10.3 mg/dL 8.1(L) 8.2(L) 8.6(L)    CBC Latest Ref Rng & Units 09/02/2017 09/01/2017 08/31/2017  WBC 3.6 - 11.0 K/uL 15.9(H) 17.8(H) 14.9(H)  Hemoglobin 12.0 - 16.0 g/dL 9.3(L) 10.2(L) 11.2(L)  Hematocrit 35.0 - 47.0 % 27.9(L) 30.8(L) 33.6(L)  Platelets 150 - 440 K/uL 261 252 285    CXR: mildly improved edema pattern  IMPRESSION: Acute hypoxemic respiratory failure Pulmonary edema Diastolic HF Hypotension - resolved PCT mildly elevated  I am unconvinced that she has PNA but her PCT is mildly elevated.   PLAN/REC: Continue current medical mgmt Wean O2 as able maintaining SpO2 > 90% Complete 5-7 days abx Transfer to MedSurg floor  - after transfer, PCCM will sign off. Please call if we can be of further assistance    Merton Border, MD PCCM service Mobile [phone] Pager [phone] 09/02/2017 1:30 PM

## 2017-09-02 NOTE — Progress Notes (Signed)
Per Dr. Alva Garnet, MAP goal of 60. Patient was transitioned off of Bipap at 0745 this morning to 3L [place]. Neo was titrated off and stopped at 1300. Patient has been resting comfortably throughout shift.

## 2017-09-02 NOTE — Progress Notes (Signed)
[place] at [place] NAME: Jill Howell    MR#:  [id number]  DATE OF BIRTH:  10/28/1918  SUBJECTIVE:  CHIEF COMPLAINT:   Chief Complaint  Patient presents with  . Shortness of Breath   Feels better with her breathing.  Off the BiPAP and on 3 L nasal cannula. No pain.  REVIEW OF SYSTEMS:    Review of Systems  Constitutional: Positive for malaise/fatigue. Negative for chills and fever.  HENT: Negative for sore throat.   Eyes: Negative for blurred vision, double vision and pain.  Respiratory: Positive for cough and shortness of breath. Negative for hemoptysis and wheezing.   Cardiovascular: Negative for chest pain, palpitations, orthopnea and leg swelling.  Gastrointestinal: Negative for abdominal pain, constipation, diarrhea, heartburn, nausea and vomiting.  Genitourinary: Negative for dysuria and hematuria.  Musculoskeletal: Positive for myalgias. Negative for back pain and joint pain.  Skin: Negative for rash.  Neurological: Positive for weakness. Negative for sensory change, speech change, focal weakness and headaches.  Endo/Heme/Allergies: Does not bruise/bleed easily.  Psychiatric/Behavioral: Negative for depression. The patient is not nervous/anxious.     DRUG ALLERGIES:   Allergies  Allergen Reactions  . Furosemide Other (See Comments)    Patient prefers not to take med, weakness and frequent urination  . Penicillins Other (See Comments)    unknown    VITALS:  Blood pressure (!) 126/53, pulse 81, temperature 97.7 F (36.5 C), temperature source Axillary, resp. rate (!) 25, height 5\' 1"  (1.549 m), weight 62.3 kg (137 lb 5.6 oz), SpO2 97 %.  PHYSICAL EXAMINATION:   Physical Exam  GENERAL:  83 y.o.-year-old patient lying in the bed with mild resp distress EYES: Pupils equal, round, reactive to light and accommodation. No scleral icterus. Extraocular muscles intact.  HEENT: Head atraumatic, normocephalic. Oropharynx and  nasopharynx clear.  NECK:  Supple, no jugular venous distention. No thyroid enlargement, no tenderness.  LUNGS: b/l coarse breath sounds CARDIOVASCULAR: S1, S2 normal. No murmurs, rubs, or gallops.  ABDOMEN: Soft, nontender, nondistended. Bowel sounds present. No organomegaly or mass.  EXTREMITIES: No cyanosis, clubbing or edema b/l.    NEUROLOGIC: Moves all 4 extremities PSYCHIATRIC: The patient is alert and awake. Anxious SKIN: No obvious rash, lesion, or ulcer.   LABORATORY PANEL:   CBC Recent Labs  Lab 09/02/17 0415  WBC 15.9*  HGB 9.3*  HCT 27.9*  PLT 261   ------------------------------------------------------------------------------------------------------------------ Chemistries  Recent Labs  Lab 09/02/17 0415  NA 134*  K 3.8  CL 97*  CO2 27  GLUCOSE 106*  BUN 29*  CREATININE 1.20*  CALCIUM 8.1*  AST 26  ALT 21  ALKPHOS 94  BILITOT 1.2   ------------------------------------------------------------------------------------------------------------------  Cardiac Enzymes Recent Labs  Lab 09/01/17 2123  TROPONINI 0.31*   ------------------------------------------------------------------------------------------------------------------  RADIOLOGY:  Dg Chest Port 1 View  Result Date: 09/02/2017 CLINICAL DATA:  Respiratory failure. EXAM: PORTABLE CHEST 1 VIEW COMPARISON:  09/01/2017. FINDINGS: The heart is enlarged. BILATERAL perihilar pulmonary opacities, extending outward from the hila, appear consolidative but overall there is less diffuse edema throughout the lung fields. Small BILATERAL remain. The heart is enlarged. Prior CABG. No pneumothorax. IMPRESSION: Overall slight improvement in aeration. Electronically Signed   By: Staci Righter M.D.   On: 09/02/2017 06:54   Dg Chest Port 1 View  Result Date: 09/01/2017 CLINICAL DATA:  Respiratory failure EXAM: PORTABLE CHEST 1 VIEW COMPARISON:  August 31, 2017 FINDINGS: Widespread airspace consolidation is  noted with relative increase in consolidation  in both upper lobes compared to 1 day prior. There is a small pleural effusion on each side. There is cardiomegaly with pulmonary venous hypertension. There is aortic atherosclerosis. Patient is status post coronary artery bypass grafting. No adenopathy. There is postoperative change involving each lateral clavicle. There is postoperative change in the right shoulder region. IMPRESSION: No spread airspace consolidation with increase in both upper lobes compared to 1 day prior. Changes elsewhere stable. Underlying pulmonary venous hypertension. Suspect multifocal pneumonia, although there [place] be a degree of alveolar edema. Both entities Arvanitis exist concurrently. Electronically Signed   By: Lowella Grip III M.D.   On: 09/01/2017 08:26   Dg Chest Port 1 View  Result Date: 08/31/2017 CLINICAL DATA:  83 year old female with shortness of breath. EXAM: PORTABLE CHEST 1 VIEW COMPARISON:  Chest radiograph dated 08/16/2017 FINDINGS: Patchy areas of airspace opacity primarily in the right upper lung field and to a lesser degree left mid and lower lung fields most consistent with multifocal pneumonia and less likely asymmetric edema. Clinical correlation is recommended. There is a small bilateral pleural effusions. No pneumothorax. Mild cardiomegaly. Median sternotomy wires and CABG vascular clips noted. There is atherosclerotic calcification of the aortic arch. Right shoulder anchor pins noted. No acute osseous pathology. IMPRESSION: Findings most consistent with multifocal pneumonia and less likely asymmetric edema. Clinical correlation is recommended. Small bilateral pleural effusions. Electronically Signed   By: Anner Crete M.D.   On: 08/31/2017 21:21     ASSESSMENT AND PLAN:   * Acute hypoxic respiratory failure due to Pneumonia and acute on chronic diastolic chf IV abx, Nebs, Oxygen, lasix Monitor I/os Waiting for Cx Repeat chest x-ray today shows some  clearing pulmonary edema.  White blood cell count is improving.  Patient is on nasal cannula today.  Likely transfer to medical floor soon.  * Sepsis POA-resolved  * Elevated troponin likely due to demand ischemia  All the records are reviewed and case discussed with Care Management/Social Workerr. Management plans discussed with the patient, family and they are in agreement.  CODE STATUS: DNR  DVT Prophylaxis: SCDs  TOTAL TIME TAKING CARE OF THIS PATIENT: 35 minutes.   POSSIBLE D/C IN 2-3 DAYS, DEPENDING ON CLINICAL CONDITION.  Leia Alf Mercedies Ganesh M.D on 09/02/2017 at 10:20 AM  Between 7am to 6pm - Pager - [phone]  After 6pm go to [email or web address] - password [email or web address] [facility]  Office  [phone]  CC: Primary care physician; Tonia Ghent, MD  Note: This dictation was prepared with Dragon dictation along with smaller phrase technology. Any transcriptional errors that result from this process are unintentional.

## 2017-09-02 NOTE — Progress Notes (Signed)
Patient started on Neo for hypotension. Neo has been titrated several times and is now infusing at 66mcg/min and blood pressures are within ordered parameters. Patient remained asymptomatic of hypotension. Patient denied to be bathing this morning. Safety maintained. Call light within reach. Will continue to monitor and endorse.

## 2017-09-02 NOTE — Progress Notes (Signed)
Pt taken off bipap per RN, tolerating well at this time, placed on 3lpm [place], sats 96%, respiratory rate 22/min, will continue to monitor

## 2017-09-03 LAB — BASIC METABOLIC PANEL
Anion gap: 12 (ref 5–15)
BUN: 31 mg/dL — AB (ref 6–20)
CO2: 28 mmol/L (ref 22–32)
CREATININE: 1.16 mg/dL — AB (ref 0.44–1.00)
Calcium: 8.5 mg/dL — ABNORMAL LOW (ref 8.9–10.3)
Chloride: 95 mmol/L — ABNORMAL LOW (ref 101–111)
GFR calc Af Amer: 45 mL/min — ABNORMAL LOW (ref >60)
GFR, EST NON AFRICAN AMERICAN: 38 mL/min — AB (ref >60)
GLUCOSE: 106 mg/dL — AB (ref 65–99)
POTASSIUM: 3.5 mmol/L (ref 3.5–5.1)
SODIUM: 135 mmol/L (ref 135–145)

## 2017-09-03 MED ORDER — BISACODYL 10 MG RE SUPP
10.0000 mg | Freq: Every day | RECTAL | Status: DC
  Administered 2017-09-03: 17:00:00 10 mg via RECTAL
  Filled 2017-09-03: qty 1

## 2017-09-03 MED ORDER — FLEET ENEMA 7-19 GM/118ML RE ENEM: 1.0000 | ENEMA | Freq: Every day | RECTAL | Status: DC | PRN

## 2017-09-03 MED ORDER — FLEET ENEMA 7-19 GM/118ML RE ENEM: 1.0000 | ENEMA | Freq: Once | RECTAL | Status: DC

## 2017-09-03 NOTE — Progress Notes (Signed)
Initial Nutrition Assessment  DOCUMENTATION CODES:   Non-severe (moderate) malnutrition in context of chronic illness  INTERVENTION:  Recommend liberalizing to 2 gram sodium diet.  Provide Magic cup TID with meals, each supplement provides 290 kcal and 9 grams of protein.  NUTRITION DIAGNOSIS:   Moderate Malnutrition related to chronic illness(CHF, advanced age) as evidenced by mild fat depletion, mild muscle depletion.  GOAL:   Patient will meet greater than or equal to 90% of their needs   MONITOR:   PO intake, Supplement acceptance, Labs, Weight trends, I & O's  REASON FOR ASSESSMENT:   Malnutrition Screening Tool    ASSESSMENT:   83 year old female with PMHx of hypothyroidism, HLD, esophageal reflux DM type 2, CAD, CHF, A-fib, asthma, HTN, hx TIA, hx MI 08/2013 who is admitted with acute hypoxic respiratory failure due to PNA and acute on chronic diastolic CHF.   Met with patient at bedside. She was somewhat confused but able to provide some nutrition history. She reports she lives alone at home. She reports that she eats 3 meals per day at home. On Monday through Friday she receives Meals on Wheels and she can typically finish most of those meals. Patient reports she does not like Ensure or Boost and does not want to drink those. She is interested in trying [employer].  She became really confused about her weight history. Per chart she has been stable for the past year.  Meal Completion: 25%  Medications reviewed and include: Dulcolax, levothyroxine, vitamin B12 1000 micrograms daily, cefepime.  Labs reviewed: Chloride 95, BUN 31, Creatinine 1.16.  Discussed with RN and also discussed on rounds.  NUTRITION - FOCUSED PHYSICAL EXAM:    Most Recent Value  Orbital Region  Mild depletion  Upper Arm Region  Mild depletion  Thoracic and Lumbar Region  Mild depletion  Buccal Region  Mild depletion  Temple Region  Mild depletion  Clavicle Bone Region  Mild depletion   Clavicle and Acromion Bone Region  Mild depletion  Scapular Bone Region  Mild depletion  Dorsal Hand  Mild depletion  Patellar Region  Mild depletion  Anterior Thigh Region  Mild depletion  Posterior Calf Region  Mild depletion  Edema (RD Assessment)  Mild  Hair  Reviewed  Eyes  Reviewed  Mouth  Reviewed  Skin  Reviewed  Nails  Reviewed     Diet Order:  Diet heart healthy/carb modified Room service appropriate? Yes; Fluid consistency: Thin  EDUCATION NEEDS:   Not appropriate for education at this time  Skin:  Skin Assessment: Reviewed RN Assessment(ecchymosis to arms and legs)  Last BM:  Unknown  Height:   Ht Readings from Last 1 Encounters:  08/31/17 '5\' 1"'$  (1.549 m)    Weight:   Wt Readings from Last 1 Encounters:  08/31/17 137 lb 5.6 oz (62.3 kg)    Ideal Body Weight:  47.7 kg  BMI:  Body mass index is 25.95 kg/m.  Estimated Nutritional Needs:   Kcal:  1560-1870 (25-30 kcal/kg)  Protein:  75-80 grams (1.2-1.3 grams/kg)  Fluid:  1.6 L/day (25 mL/kg)  Willey Blade, MS, RD, LDN Office: [phone] Pager: [phone] After Hours/Weekend Pager: [phone]

## 2017-09-03 NOTE — NC FL2 (Signed)
[place] LEVEL OF CARE SCREENING TOOL     IDENTIFICATION  Patient Name: Jill Howell Birthdate: Sep 23, 1918 Sex: female Admission Date (Current Location): 08/31/2017  [place] and [place] Number:  Engineering geologist and Address:  [facility], [address] [place], [place] [address]      Provider Number: [id number]  Attending Physician Name and Address:  Hillary Bow, MD  Relative Name and Phone Number:       Current Level of Care: Hospital Recommended Level of Care: [place] Prior Approval Number:    Date Approved/Denied:   PASRR Number: [id number])  Discharge Plan: SNF    Current Diagnoses: Patient Active Problem List   Diagnosis Date Noted  . Acute diastolic heart failure ([name])   . Respiratory failure ([place])   . DNR (do not resuscitate)   . Palliative care encounter   . HCAP (healthcare-associated pneumonia) 08/31/2017  . Rhabdomyolysis 08/12/2017  . Advance care planning 01/08/2017  . Hx of CABG 12/26/2016  . Cough 04/28/2016  . Hyperkalemia 04/22/2016  . Other fatigue 04/17/2016  . Pressure ulcer 02/20/2016  . Atherosclerosis of native coronary artery of native heart with stable angina pectoris ([place]) 02/19/2016  . Dementia 02/19/2016  . Hyponatremia 02/19/2016  . Chronic diastolic congestive heart failure ([place]) 02/19/2016  . Carotid stenosis 06/09/2014  . Asthmatic bronchitis , chronic ([place]) 03/24/2014  . Family discord 02/18/2014  . Aftercare following surgery of the circulatory system, [place] 12/07/2013  . Malaise and fatigue 12/01/2013  . Benign hypertensive heart disease without heart failure 08/17/2013  . At risk for falls 08/12/2013  . Anemia   . TIA (transient ischemic attack) 05/17/2013  . Occlusion and stenosis of carotid artery without mention of cerebral infarction 05/13/2013  . Fall at home 05/11/2013  . PAF (paroxysmal atrial fibrillation) ([place]) 08/11/2012  . Carotid artery disease  ([place]) 08/19/2011  . Dermatitis 05/09/2011  . Hypothyroidism 09/15/2007  . History of diabetes mellitus, type II 09/15/2007  . Hyperlipidemia 09/15/2007  . HEARING IMPAIRMENT 09/15/2007  . Coronary atherosclerosis 09/15/2007  . Congestive heart failure ([place]) 09/15/2007  . ALLERGIC RHINITIS 09/15/2007  . GERD 09/15/2007    Orientation RESPIRATION BLADDER Height & Weight     Self, Place  O2(1 Liter Oxygen ) Continent Weight: 137 lb 5.6 oz (62.3 kg) Height:  5\' 1"  (154.9 cm)  BEHAVIORAL SYMPTOMS/MOOD NEUROLOGICAL BOWEL NUTRITION STATUS      Incontinent Diet(Diet: 2 Grams Sodium. )  AMBULATORY STATUS COMMUNICATION OF NEEDS Skin   Extensive Assist Verbally Normal                       Personal Care Assistance Level of Assistance  Bathing, Feeding, Dressing Bathing Assistance: Limited assistance Feeding assistance: Independent Dressing Assistance: Limited assistance     Functional Limitations Info  Sight, Hearing, Speech Sight Info: Adequate Hearing Info: Adequate Speech Info: Adequate    SPECIAL CARE FACTORS FREQUENCY  PT (By licensed PT), OT (By licensed OT)     PT Frequency: (5) OT Frequency: (5)            Contractures      Additional Factors Info  Code Status, Allergies Code Status Info: (DNR) Allergies Info: (Furosemide, Penicillins)           Current Medications (09/03/2017):  This is the current hospital active medication list Current Facility-Administered Medications  Medication Dose Route Frequency Provider Last Rate Last Dose  . 0.9 %  sodium chloride  infusion  250 mL Intravenous PRN Salary, Montell D, MD      . acetaminophen (TYLENOL) tablet 650 mg  650 mg Oral Q6H PRN Salary, Montell D, MD      . aspirin EC tablet 81 mg  81 mg Oral Daily Salary, Montell D, MD   81 mg at 09/03/17 1048  . bisacodyl (DULCOLAX) suppository 10 mg  10 mg Rectal Daily Dellinger, Marianne L, PA-C   10 mg at 09/03/17 1643  . bumetanide (BUMEX) tablet 1 mg  1 mg Oral  BID Salary, Holly Bodily D, MD   1 mg at 09/03/17 1826  . ceFEPIme (MAXIPIME) 2 g in dextrose 5 % 50 mL IVPB  2 g Intravenous Q24H Gorden Harms, MD   Stopped at 09/02/17 2321  . digoxin (LANOXIN) tablet 125 mcg  125 mcg Oral Daily Salary, Montell D, MD   125 mcg at 09/03/17 1048  . enoxaparin (LOVENOX) injection 30 mg  30 mg Subcutaneous Q24H Salary, Montell D, MD   30 mg at 09/02/17 2219  . isosorbide mononitrate (IMDUR) 24 hr tablet 60 mg  60 mg Oral Daily Wilhelmina Mcardle, MD   60 mg at 09/03/17 1047  . levothyroxine (SYNTHROID, LEVOTHROID) tablet 100 mcg  100 mcg Oral QAC breakfast Salary, Holly Bodily D, MD   100 mcg at 09/03/17 0813  . losartan (COZAAR) tablet 25 mg  25 mg Oral Daily Wilhelmina Mcardle, MD   25 mg at 09/03/17 1048  . magnesium hydroxide (MILK OF MAGNESIA) NICU  ORAL  syringe  30 mL Oral Daily PRN Wilhelmina Mcardle, MD      . nitroGLYCERIN (NITROSTAT) SL tablet 0.4 mg  0.4 mg Sublingual Q5 min PRN Salary, Montell D, MD      . ondansetron (ZOFRAN) injection 4 mg  4 mg Intravenous Q6H PRN Salary, Montell D, MD      . polyethylene glycol (MIRALAX / GLYCOLAX) packet 17 g  17 g Oral Daily PRN Salary, Montell D, MD   17 g at 09/03/17 0541  . QUEtiapine (SEROQUEL) tablet 25 mg  25 mg Oral QHS Salary, Montell D, MD   25 mg at 09/01/17 2255  . simvastatin (ZOCOR) tablet 40 mg  40 mg Oral Daily Salary, Montell D, MD   40 mg at 09/03/17 1826  . sodium chloride flush (NS) 0.9 % injection 3 mL  3 mL Intravenous Q12H Salary, Montell D, MD   3 mL at 09/03/17 1000  . sodium chloride flush (NS) 0.9 % injection 3 mL  3 mL Intravenous PRN Salary, Montell D, MD      . sodium phosphate (FLEET) 7-19 GM/118ML enema 1 enema  1 enema Rectal Daily PRN Dellinger, Bobby Rumpf, PA-C      . vitamin B-12 (CYANOCOBALAMIN) tablet 1,000 mcg  1,000 mcg Oral Daily Salary, Montell D, MD   1,000 mcg at 09/03/17 1048     Discharge Medications: Please see discharge summary for a list of discharge medications.  Relevant  Imaging Results:  Relevant Lab Results:   Additional Information (SSN: [id number])  Laquetta Racey, Veronia Beets, LCSW

## 2017-09-03 NOTE — Clinical Social Work Note (Addendum)
Clinical Social Work Assessment  Patient Details  Name: Jill Howell MRN: [id number] Date of Birth: 1918-10-22  Date of referral:  09/03/17               Reason for consult:  Other (Comment Required)(From [employer] )                Permission sought to share information with:  Chartered certified accountant granted to share information::  Yes, Verbal Permission Granted  Name::        Agency::     Relationship::     Contact Information:     Housing/Transportation Living arrangements for the past 2 months:  [place], [place] of Information:  Patient, Facility Patient Interpreter Needed:  None Criminal Activity/Legal Involvement Pertinent to Current Situation/Hospitalization:  No - Comment as needed Significant Relationships:  Adult Children Lives with:  Self Do you feel safe going back to the place where you live?  Yes Need for family participation in patient care:  Yes (Comment)  Care giving concerns:  Patient is from [facility], short term rehab.    Social Worker assessment / plan:  Holiday representative (CSW) received consult that patient is from [facility]. CSW contacted [facility] admissions coordinator at [facility] to get additional information. Per Magda Paganini patient is a short term rehab resident and was about to discharge home from [facility] on Friday. Per Magda Paganini patient can return to [facility]. CSW met with patient alone at bedside to discuss D/C plan. Patient was alert and oriented to self and place and was sitting up in the bed. CSW introduced self and explained role of CSW department. Patient reported that she normally lives alone in [place] and has 4 adult sons. Per patient she is currently at [facility] for rehab and is agreeable to return there. Per patient her son [name] is her main contact. Patient reported that [name] is a Dealer and works during the day. CSW left [name] a [employer].  FL2 complete. CSW will continue to follow and assist as needed.   Patient's son [name] called CSW back and is in agreement with patient returning to [facility] for rehab.   Employment status:  Disabled (Comment on whether or not currently receiving Disability) Insurance information:  Medicare PT Recommendations:  Not assessed at this time Information / Referral to community resources:  [place]  Patient/Family's Response to care:  Patient is agreeable to return to [facility].   Patient/Family's Understanding of and Emotional Response to Diagnosis, Current Treatment, and Prognosis:  Patient was very pleasant and thanked CSW for assistance.   Emotional Assessment Appearance:  Appears stated age Attitude/Demeanor/Rapport:    Affect (typically observed):  Accepting, Adaptable, Pleasant Orientation:  Oriented to Self, Oriented to Place, Fluctuating Orientation (Suspected and/or reported Sundowners) Alcohol / Substance use:  Not Applicable Psych involvement (Current and /or in the community):  No (Comment)  Discharge Needs  Concerns to be addressed:  Discharge Planning Concerns Readmission within the last 30 days:  No Current discharge risk:  Cognitively Impaired, Dependent with Mobility Barriers to Discharge:  Continued Medical Work up   [employer], Veronia Beets, LCSW 09/03/2017, 8:43 PM

## 2017-09-03 NOTE — Progress Notes (Signed)
[place] at [place] NAME: Jill Howell    MR#:  [id number]  DATE OF BIRTH:  04/01/1919  SUBJECTIVE:  CHIEF COMPLAINT:   Chief Complaint  Patient presents with  . Shortness of Breath   Feels better with her breathing. Pleasantly confused  REVIEW OF SYSTEMS:    Review of Systems  Constitutional: Positive for malaise/fatigue. Negative for chills and fever.  HENT: Negative for sore throat.   Eyes: Negative for blurred vision, double vision and pain.  Respiratory: Positive for cough and shortness of breath. Negative for hemoptysis and wheezing.   Cardiovascular: Negative for chest pain, palpitations, orthopnea and leg swelling.  Gastrointestinal: Negative for abdominal pain, constipation, diarrhea, heartburn, nausea and vomiting.  Genitourinary: Negative for dysuria and hematuria.  Musculoskeletal: Positive for myalgias. Negative for back pain and joint pain.  Skin: Negative for rash.  Neurological: Positive for weakness. Negative for sensory change, speech change, focal weakness and headaches.  Endo/Heme/Allergies: Does not bruise/bleed easily.  Psychiatric/Behavioral: Negative for depression. The patient is not nervous/anxious.     DRUG ALLERGIES:   Allergies  Allergen Reactions  . Furosemide Other (See Comments)    Patient prefers not to take med, weakness and frequent urination  . Penicillins Other (See Comments)    unknown    VITALS:  Blood pressure 135/69, pulse 86, temperature 98.7 F (37.1 C), temperature source Oral, resp. rate (!) 25, height 5\' 1"  (1.549 m), weight 62.3 kg (137 lb 5.6 oz), SpO2 95 %.  PHYSICAL EXAMINATION:   Physical Exam  GENERAL:  83 y.o.-year-old patient lying in the bed with mild resp distress EYES: Pupils equal, round, reactive to light and accommodation. No scleral icterus. Extraocular muscles intact.  HEENT: Head atraumatic, normocephalic. Oropharynx and nasopharynx clear.  NECK:  Supple, no  jugular venous distention. No thyroid enlargement, no tenderness.  LUNGS: B/l crackles CARDIOVASCULAR: S1, S2 normal. No murmurs, rubs, or gallops.  ABDOMEN: Soft, nontender, nondistended. Bowel sounds present. No organomegaly or mass.  EXTREMITIES: No cyanosis, clubbing or edema b/l.    NEUROLOGIC: Moves all 4 extremities PSYCHIATRIC: The patient is alert and awake.  SKIN: No obvious rash, lesion, or ulcer.   LABORATORY PANEL:   CBC Recent Labs  Lab 09/02/17 0415  WBC 15.9*  HGB 9.3*  HCT 27.9*  PLT 261   ------------------------------------------------------------------------------------------------------------------ Chemistries  Recent Labs  Lab 09/02/17 0415 09/03/17 0511  NA 134* 135  K 3.8 3.5  CL 97* 95*  CO2 27 28  GLUCOSE 106* 106*  BUN 29* 31*  CREATININE 1.20* 1.16*  CALCIUM 8.1* 8.5*  AST 26  --   ALT 21  --   ALKPHOS 94  --   BILITOT 1.2  --    ------------------------------------------------------------------------------------------------------------------  Cardiac Enzymes Recent Labs  Lab 09/01/17 2123  TROPONINI 0.31*   ------------------------------------------------------------------------------------------------------------------  RADIOLOGY:  Dg Chest Port 1 View  Result Date: 09/02/2017 CLINICAL DATA:  Respiratory failure. EXAM: PORTABLE CHEST 1 VIEW COMPARISON:  09/01/2017. FINDINGS: The heart is enlarged. BILATERAL perihilar pulmonary opacities, extending outward from the hila, appear consolidative but overall there is less diffuse edema throughout the lung fields. Small BILATERAL remain. The heart is enlarged. Prior CABG. No pneumothorax. IMPRESSION: Overall slight improvement in aeration. Electronically Signed   By: Staci Righter M.D.   On: 09/02/2017 06:54     ASSESSMENT AND PLAN:   * Acute hypoxic respiratory failure due to Pneumonia and acute on chronic diastolic chf IV abx, Nebs, Oxygen, Bumex Monitor  I/os Cx NGTD Wean O2 as  tolerated  * Sepsis POA-resolved  * Elevated troponin likely due to demand ischemia  All the records are reviewed and case discussed with Care Management/Social Workerr. Management plans discussed with the patient, family and they are in agreement.  CODE STATUS: DNR  DVT Prophylaxis: SCDs  TOTAL TIME TAKING CARE OF THIS PATIENT: 35 minutes.   POSSIBLE D/C IN 2-3 DAYS, DEPENDING ON CLINICAL CONDITION.  Leia Alf Arlenne Kimbley M.D on 09/03/2017 at 1:14 PM  Between 7am to 6pm - Pager - [phone]  After 6pm go to [email or web address] - password [email or web address] [place] [facility]  Office  [phone]  CC: Primary care physician; Tonia Ghent, MD  Note: This dictation was prepared with Dragon dictation along with smaller phrase technology. Any transcriptional errors that result from this process are unintentional.

## 2017-09-03 NOTE — Progress Notes (Signed)
Daily Progress Note   Patient Name: Jill Howell       Date: 09/03/2017 DOB: August 07, 1919  Age: 83 y.o. MRN#: [id number] Attending Physician: Hillary Bow, MD Primary Care Physician: Tonia Ghent, MD Admit Date: 08/31/2017  Reason for Consultation/Follow-up: Establishing goals of care  Subjective: Patient reports she was very very sick and feeling bad, but is now much better.  She appears very pleasantly demented.   I called her son on the phone and left a voice mail.  Assessment: 83 yo female with acute on chronic diastolic heart failure.  Now off of BIPAP and pressors.  Improving overall. Echo completed 12/25 shows grade 2 diastolic dysfunction, moderate regurg, and elevated pulmonary pressures.   Need to determine if she is able to get out of bed  - how deconditioned she is after her ICU stay.   Patient Profile/HPI:  83 y.o. female  with past medical history of CHF, afib, TIA, Asthma, dementia who was admitted on 08/31/2017 with hypoxic respiratory failure. CXR showed multifocal pneumonia and pulmonary vascular congestion.     Length of Stay: 3  Current Medications: Scheduled Meds:  . aspirin EC  81 mg Oral Daily  . bumetanide  1 mg Oral BID  . digoxin  125 mcg Oral Daily  . enoxaparin (LOVENOX) injection  30 mg Subcutaneous Q24H  . isosorbide mononitrate  60 mg Oral Daily  . levothyroxine  100 mcg Oral QAC breakfast  . losartan  25 mg Oral Daily  . QUEtiapine  25 mg Oral QHS  . simvastatin  40 mg Oral Daily  . sodium chloride flush  3 mL Intravenous Q12H  . vitamin B-12  1,000 mcg Oral Daily    Continuous Infusions: . sodium chloride    . ceFEPime (MAXIPIME) IV Stopped (09/02/17 2321)    PRN Meds: sodium chloride, acetaminophen **OR** [DISCONTINUED] acetaminophen,  magnesium hydroxide, nitroGLYCERIN, [DISCONTINUED] ondansetron **OR** ondansetron (ZOFRAN) IV, polyethylene glycol, sodium chloride flush  Physical Exam        Well developed 83 yo female.  Pleasantly demented.  Conversant, Laughs more than expected. CV rrr  resp no increased work of breathing. Abdomen protuberant and firm, nt   Vital Signs: BP 135/69 (BP Location: Right Arm)   Pulse 92   Temp 98.7 F (37.1 C) (Oral)   Resp [name])  27   Ht 5\' 1"  (1.549 m)   Wt 62.3 kg (137 lb 5.6 oz)   SpO2 97%   BMI 25.95 kg/m  SpO2: SpO2: 97 % O2 Device: O2 Device: Nasal Cannula O2 Flow Rate: O2 Flow Rate (L/min): 3 L/min  Intake/output summary:   Intake/Output Summary (Last 24 hours) at 09/03/2017 1231 Last data filed at 09/03/2017 0600 Gross per 24 hour  Intake 147.63 ml  Output 1450 ml  Net -1302.37 ml   LBM: Last BM Date: (pta) Baseline Weight: Weight: 63.5 kg (140 lb) Most recent weight: Weight: 62.3 kg (137 lb 5.6 oz)       Palliative Assessment/Data: 40%      Patient Active Problem List   Diagnosis Date Noted  . Acute diastolic heart failure ([place])   . Respiratory failure ([place])   . DNR (do not resuscitate)   . Palliative care encounter   . HCAP (healthcare-associated pneumonia) 08/31/2017  . Rhabdomyolysis 08/12/2017  . Advance care planning 01/08/2017  . Hx of CABG 12/26/2016  . Cough 04/28/2016  . Hyperkalemia 04/22/2016  . Other fatigue 04/17/2016  . Pressure ulcer 02/20/2016  . Atherosclerosis of native coronary artery of native heart with stable angina pectoris ([place]) 02/19/2016  . Dementia 02/19/2016  . Hyponatremia 02/19/2016  . Chronic diastolic congestive heart failure ([place]) 02/19/2016  . Carotid stenosis 06/09/2014  . Asthmatic bronchitis , chronic ([place]) 03/24/2014  . Family discord 02/18/2014  . Aftercare following surgery of the circulatory system, [place] 12/07/2013  . Malaise and fatigue 12/01/2013  . Benign hypertensive heart disease without heart  failure 08/17/2013  . At risk for falls 08/12/2013  . Anemia   . TIA (transient ischemic attack) 05/17/2013  . Occlusion and stenosis of carotid artery without mention of cerebral infarction 05/13/2013  . Fall at home 05/11/2013  . PAF (paroxysmal atrial fibrillation) ([place]) 08/11/2012  . Carotid artery disease ([place]) 08/19/2011  . Dermatitis 05/09/2011  . Hypothyroidism 09/15/2007  . History of diabetes mellitus, type II 09/15/2007  . Hyperlipidemia 09/15/2007  . HEARING IMPAIRMENT 09/15/2007  . Coronary atherosclerosis 09/15/2007  . Congestive heart failure ([place]) 09/15/2007  . ALLERGIC RHINITIS 09/15/2007  . GERD 09/15/2007    Palliative Care Plan    Recommendations/Plan:  DNR - with full scope treatment.  Will request PT eval.  Patient was walking prior to admission (living alone).    PMT will continue to follow the patient for [place] - consider MOST form with son [name].  Goals of Care and Additional Recommendations:  Limitations on Scope of Treatment: Full Scope Treatment  Code Status:  DNR  Prognosis:   Unable to determine but likely less than 6 months due to recent decline 2 rapid hospitalizations, diastolic heart failure, pulmonary hypertension.    Discharge Planning:  [place] for rehab with Palliative care service follow-up  Care plan was discussed with left voice mail for son.  Thank you for allowing the Palliative Medicine Team to assist in the care of this patient.  Total time spent:  25 min.     Greater than 50%  of this time was spent counseling and coordinating care related to the above assessment and plan.  Florentina Jenny, PA-C Palliative Medicine  Please contact Palliative MedicineTeam phone at [phone] for questions and concerns between 7 am - 7 pm.   Please see AMION for individual provider pager numbers.

## 2017-09-04 DIAGNOSIS — Z9189 Other specified personal risk factors, not elsewhere classified: Secondary | ICD-10-CM

## 2017-09-04 LAB — CBC WITH DIFFERENTIAL/PLATELET
BASOS ABS: 0.1 10*3/uL (ref 0–0.1)
Basophils Relative: 1 %
EOS ABS: 0.3 10*3/uL (ref 0–0.7)
EOS PCT: 3 %
HCT: 27.2 % — ABNORMAL LOW (ref 35.0–47.0)
Hemoglobin: 9.3 g/dL — ABNORMAL LOW (ref 12.0–16.0)
LYMPHS PCT: 19 %
Lymphs Abs: 1.7 10*3/uL (ref 1.0–3.6)
MCH: 29.1 pg (ref 26.0–34.0)
MCHC: 34.1 g/dL (ref 32.0–36.0)
MCV: 85.4 fL (ref 80.0–100.0)
MONO ABS: 1.2 10*3/uL — AB (ref 0.2–0.9)
Monocytes Relative: 14 %
Neutro Abs: 5.6 10*3/uL (ref 1.4–6.5)
Neutrophils Relative %: 63 %
PLATELETS: 242 10*3/uL (ref 150–440)
RBC: 3.19 MIL/uL — AB (ref 3.80–5.20)
RDW: 14.5 % (ref 11.5–14.5)
WBC: 8.7 10*3/uL (ref 3.6–11.0)

## 2017-09-04 LAB — BASIC METABOLIC PANEL
ANION GAP: 11 (ref 5–15)
BUN: 36 mg/dL — AB (ref 6–20)
CALCIUM: 8.2 mg/dL — AB (ref 8.9–10.3)
CO2: 31 mmol/L (ref 22–32)
Chloride: 92 mmol/L — ABNORMAL LOW (ref 101–111)
Creatinine, Ser: 1.37 mg/dL — ABNORMAL HIGH (ref 0.44–1.00)
GFR calc Af Amer: 36 mL/min — ABNORMAL LOW (ref >60)
GFR, EST NON AFRICAN AMERICAN: 31 mL/min — AB (ref >60)
GLUCOSE: 111 mg/dL — AB (ref 65–99)
Potassium: 3.5 mmol/L (ref 3.5–5.1)
SODIUM: 134 mmol/L — AB (ref 135–145)

## 2017-09-04 LAB — LEGIONELLA PNEUMOPHILA SEROGP 1 UR AG: L. pneumophila Serogp 1 Ur Ag: NEGATIVE

## 2017-09-04 MED ORDER — LEVOFLOXACIN 250 MG PO TABS
500.0000 mg | ORAL_TABLET | ORAL | Status: DC
  Administered 2017-09-04: 500 mg via ORAL
  Filled 2017-09-04: qty 2

## 2017-09-04 NOTE — Progress Notes (Signed)
Daily Progress Note   Patient Name: Jill Howell       Date: 09/04/2017 DOB: [birth year]  Age: 83 y.o. MRN#: [id number] Attending Physician: Jill Bow, MD Primary Care Physician: Jill Ghent, MD Admit Date: 08/31/2017  Reason for Consultation/Follow-up: Establishing goals of care  Subjective: Observed patient working with physical therapy.  She is alert, talking, cheerful, mildly confused.  NAD.  Attempted to call son Jill Howell.  Would like to suggest a MOST form be completed as she is likely discharging to SNF soon, but will decline quickly again given her advanced heart failure and age.  Assessment: Very appropriate for Palliative Care to follow at SNF.  Creatinine bumped (1.37 vs 0.95) compared to earlier this admission.   Patient Profile/HPI:   83 y.o.femalewith past medical history of CHF, afib, TIA, Asthma, dementiawho was admitted on 12/23/2018with hypoxic respiratory failure.CXR showed multifocal pneumonia and pulmonary vascular congestion  Length of Stay: 4  Current Medications: Scheduled Meds:  . aspirin EC  81 mg Oral Daily  . bisacodyl  10 mg Rectal Daily  . bumetanide  1 mg Oral BID  . digoxin  125 mcg Oral Daily  . enoxaparin (LOVENOX) injection  30 mg Subcutaneous Q24H  . isosorbide mononitrate  60 mg Oral Daily  . levothyroxine  100 mcg Oral QAC breakfast  . losartan  25 mg Oral Daily  . QUEtiapine  25 mg Oral QHS  . simvastatin  40 mg Oral Daily  . sodium chloride flush  3 mL Intravenous Q12H  . vitamin B-12  1,000 mcg Oral Daily    Continuous Infusions: . sodium chloride    . ceFEPime (MAXIPIME) IV Stopped (09/04/17 0150)    PRN Meds: sodium chloride, acetaminophen **OR** [DISCONTINUED] acetaminophen, magnesium hydroxide, nitroGLYCERIN,  [DISCONTINUED] ondansetron **OR** ondansetron (ZOFRAN) IV, polyethylene glycol, sodium chloride flush, sodium phosphate  Physical Exam        Elderly female, awake, talkative, mildly confused, cheerful. On N/C at 2L, no distress.  Vital Signs: BP (!) 126/48 (BP Location: Left Arm)   Pulse 78   Temp 97.6 F (36.4 C) (Oral)   Resp 15   Ht 5\' 1"  (1.549 m)   Wt 62.3 kg (137 lb 5.6 oz)   SpO2 95%   BMI 25.95 kg/m  SpO2: SpO2: 95 % O2 Device: O2 Device:  Nasal Cannula O2 Flow Rate: O2 Flow Rate (L/min): 2 L/min  Intake/output summary:   Intake/Output Summary (Last 24 hours) at 09/04/2017 6962 Last data filed at 09/04/2017 0610 Gross per 24 hour  Intake -  Output 150 ml  Net -150 ml   LBM: Last BM Date: (BM unknown) Baseline Weight: Weight: 63.5 kg (140 lb) Most recent weight: Weight: 62.3 kg (137 lb 5.6 oz)       Palliative Assessment/Data: 40%      Patient Active Problem List   Diagnosis Date Noted  . Acute diastolic heart failure ([place])   . Respiratory failure ([place])   . DNR (do not resuscitate)   . Palliative care encounter   . HCAP (healthcare-associated pneumonia) 08/31/2017  . Rhabdomyolysis 08/12/2017  . Advance care planning 01/08/2017  . Hx of CABG 12/26/2016  . Cough 04/28/2016  . Hyperkalemia 04/22/2016  . Other fatigue 04/17/2016  . Pressure ulcer 02/20/2016  . Atherosclerosis of native coronary artery of native heart with stable angina pectoris ([place]) 02/19/2016  . Dementia 02/19/2016  . Hyponatremia 02/19/2016  . Chronic diastolic congestive heart failure ([place]) 02/19/2016  . Carotid stenosis 06/09/2014  . Asthmatic bronchitis , chronic ([place]) 03/24/2014  . Family discord 02/18/2014  . Aftercare following surgery of the circulatory system, [place] 12/07/2013  . Malaise and fatigue 12/01/2013  . Benign hypertensive heart disease without heart failure 08/17/2013  . At risk for falls 08/12/2013  . Anemia   . TIA (transient ischemic attack) 05/17/2013  .  Occlusion and stenosis of carotid artery without mention of cerebral infarction 05/13/2013  . Fall at home 05/11/2013  . PAF (paroxysmal atrial fibrillation) ([place]) 08/11/2012  . Carotid artery disease ([place]) 08/19/2011  . Dermatitis 05/09/2011  . Hypothyroidism 09/15/2007  . History of diabetes mellitus, type II 09/15/2007  . Hyperlipidemia 09/15/2007  . HEARING IMPAIRMENT 09/15/2007  . Coronary atherosclerosis 09/15/2007  . Congestive heart failure ([place]) 09/15/2007  . ALLERGIC RHINITIS 09/15/2007  . GERD 09/15/2007    Palliative Care Plan    Recommendations/Plan:  Palliative care to follow at SNF on D/C.  Please write in D/C instructions "Palliative Care to follow at SNF".   I have left a message for son Jill Howell) to call me regarding MOST form and he needs to understand that given her heart failure, fragile kidneys and age - symptoms that brought her to the hospital will recur again (possibly soon)  Goals of Care and Additional Recommendations:  Limitations on Scope of Treatment: Full Scope Treatment  Code Status:  DNR  Prognosis:   < 6 months given her heart failure, fragile kidneys and age  Discharge Planning:  [place] for rehab with Palliative care service follow-up    Thank you for allowing the Palliative Medicine Team to assist in the care of this patient.  Total time spent:  35 min.     Greater than 50%  of this time was spent counseling and coordinating care related to the above assessment and plan.  Florentina Jenny, PA-C Palliative Medicine  Please contact Palliative MedicineTeam phone at [phone] for questions and concerns between 7 am - 7 pm.   Please see AMION for individual provider pager numbers.

## 2017-09-04 NOTE — Progress Notes (Signed)
[facility] [place] NAME: Basha Ferrentino    MR#:  [id number]  DATE OF BIRTH:  12-21-1918  SUBJECTIVE:  CHIEF COMPLAINT:   Chief Complaint  Patient presents with  . Shortness of Breath   On 2 L O2 Pleasantly confused Seems less short of breath  REVIEW OF SYSTEMS:    Review of Systems  Constitutional: Positive for malaise/fatigue. Negative for chills and fever.  HENT: Negative for sore throat.   Eyes: Negative for blurred vision, double vision and pain.  Respiratory: Positive for cough and shortness of breath. Negative for hemoptysis and wheezing.   Cardiovascular: Negative for chest pain, palpitations, orthopnea and leg swelling.  Gastrointestinal: Negative for abdominal pain, constipation, diarrhea, heartburn, nausea and vomiting.  Genitourinary: Negative for dysuria and hematuria.  Musculoskeletal: Positive for myalgias. Negative for back pain and joint pain.  Skin: Negative for rash.  Neurological: Positive for weakness. Negative for sensory change, speech change, focal weakness and headaches.  Endo/Heme/Allergies: Does not bruise/bleed easily.  Psychiatric/Behavioral: Negative for depression. The patient is not nervous/anxious.     DRUG ALLERGIES:   Allergies  Allergen Reactions  . Furosemide Other (See Comments)    Patient prefers not to take med, weakness and frequent urination  . Penicillins Other (See Comments)    unknown    VITALS:  Blood pressure (!) 124/42, pulse 82, temperature 97.6 F (36.4 C), temperature source Oral, resp. rate 15, height 5\' 1"  (1.549 m), weight 62.3 kg (137 lb 5.6 oz), SpO2 95 %.  PHYSICAL EXAMINATION:   Physical Exam  GENERAL:  83 y.o.-year-old patient lying in the bed with mild resp distress EYES: Pupils equal, round, reactive to light and accommodation. No scleral icterus. Extraocular muscles intact.  HEENT: Head atraumatic, normocephalic. Oropharynx and nasopharynx clear.  NECK:  Supple, no  jugular venous distention. No thyroid enlargement, no tenderness.  LUNGS: B/l crackles CARDIOVASCULAR: S1, S2 normal. No murmurs, rubs, or gallops.  ABDOMEN: Soft, nontender, nondistended. Bowel sounds present. No organomegaly or mass.  EXTREMITIES: No cyanosis, clubbing or edema b/l.    NEUROLOGIC: Moves all 4 extremities PSYCHIATRIC: The patient is alert and awake.  SKIN: No obvious rash, lesion, or ulcer.   LABORATORY PANEL:   CBC Recent Labs  Lab 09/04/17 0416  WBC 8.7  HGB 9.3*  HCT 27.2*  PLT 242   ------------------------------------------------------------------------------------------------------------------ Chemistries  Recent Labs  Lab 09/02/17 0415  09/04/17 0416  NA 134*   < > 134*  K 3.8   < > 3.5  CL 97*   < > 92*  CO2 27   < > 31  GLUCOSE 106*   < > 111*  BUN 29*   < > 36*  CREATININE 1.20*   < > 1.37*  CALCIUM 8.1*   < > 8.2*  AST 26  --   --   ALT 21  --   --   ALKPHOS 94  --   --   BILITOT 1.2  --   --    < > = values in this interval not displayed.   ------------------------------------------------------------------------------------------------------------------  Cardiac Enzymes Recent Labs  Lab 09/01/17 2123  TROPONINI 0.31*   ------------------------------------------------------------------------------------------------------------------  RADIOLOGY:  No results found.   ASSESSMENT AND PLAN:   * Acute hypoxic respiratory failure due to Pneumonia and acute on chronic diastolic CHF IV abx, Nebs, Oxygen, Bumex PO IV lasix stopped Monitor I/os Cx NGTD Wean O2 as tolerated. Does not wear Oxygen normally  Likely d/c  to SNF in 1-2 days  * Sepsis POA-resolved  * Elevated troponin likely due to demand ischemia  All the records are reviewed and case discussed with Care Management/Social Worker Management plans discussed with the patient, family and they are in agreement.  CODE STATUS: DNR  DVT Prophylaxis: SCDs  TOTAL TIME  TAKING CARE OF THIS PATIENT: 35 minutes.   POSSIBLE D/C IN 2-3 DAYS, DEPENDING ON CLINICAL CONDITION.  Leia Alf Gaile Allmon M.D on 09/04/2017 at 12:38 PM  Between 7am to 6pm - Pager - [phone]  After 6pm go to [email or web address] - password [email or web address] [facility]  Office  [phone]  CC: Primary care physician; Tonia Ghent, MD  Note: This dictation was prepared with Dragon dictation along with smaller phrase technology. Any transcriptional errors that result from this process are unintentional.

## 2017-09-04 NOTE — Evaluation (Signed)
Physical Therapy Evaluation Patient Details Name: Jill Howell MRN: [id number] DOB: 1919-06-11 Today's Date: 09/04/2017   History of Present Illness  Jill Howell is a 83 y/o F who presented from SNF with SOB, orthopnea.  Jill Howell required BiPAP in the ED, was noted to be tachycardic, tachypneic, chest x-ray noted for pneumonia, BLE pitting edema.  Jill Howell admitted for acute hypoxic respiratory failure most likely secondary to combination of acute on chronic diastolic congestive heart failure exacerbation and bilateral multifocal HCAP.  Jill Howell's PMH includes dementia, anemia, a-fib, NSTEMI, CHF, TIA, MI, RTC repair.     Clinical Impression  Jill Howell admitted with above diagnosis. Jill Howell currently with functional limitations due to the deficits listed below (see Jill Howell Problem List). Jill Howell presents with impaired cognition due to dementia and is oriented to person only.  She currently requires up to mod assist for transfers and min assist to ambulate 25 ft using RW due to instability and LOB.  Jill Howell is at a high risk of falling.  Given Jill Howell's current mobility and cognitive status, recommending SNF at d/c.   Jill Howell will benefit from skilled Jill Howell to increase their independence and safety with mobility to allow discharge to the venue listed below.      Follow Up Recommendations SNF    Equipment Recommendations  Other (comment)(RW if Jill Howell does not already have one)    Recommendations for Other Services       Precautions / Restrictions Precautions Precautions: Fall;Other (comment) Precaution Comments: O2 Restrictions Weight Bearing Restrictions: No      Mobility  Bed Mobility Overal bed mobility: Needs Assistance Bed Mobility: Supine to Sit;Sit to Supine     Supine to sit: Min assist;HOB elevated Sit to supine: Min assist   General bed mobility comments: Increased time and effort with heavy use of bed rail.  Assist to scoot to EOB with use of bed pad.    Transfers Overall transfer level: Needs assistance Equipment used: Rolling  walker (2 wheeled) Transfers: Sit to/from Stand Sit to Stand: Supervision         General transfer comment: Jill Howell requires mod assist to boost to standing and to remain steady when standing from bed.  She requires min assist to stand from chair after cues to scoot to edge of seat and flex Bil knees for improved mechanical advantage.    Ambulation/Gait Ambulation/Gait assistance: Min assist Ambulation Distance (Feet): 25 Feet Assistive device: Rolling walker (2 wheeled) Gait Pattern/deviations: Decreased stride length;Trunk flexed;Staggering left;Staggering right Gait velocity: decreased Gait velocity interpretation: Below normal speed for age/gender General Gait Details: Jill Howell demonstrates flexed posture and staggers L and R requiring assist to stabilize.  SpO2 down as low as 86% on RA when initially ambulating which improved and remained in low 90's with 2L O2 for remainder of session.   Stairs            Wheelchair Mobility    Modified Rankin (Stroke Patients Only)       Balance Overall balance assessment: Needs assistance Sitting-balance support: No upper extremity supported;Feet supported Sitting balance-Leahy Scale: Fair     Standing balance support: Bilateral upper extremity supported;During functional activity Standing balance-Leahy Scale: Poor Standing balance comment: Jill Howell relies on UE support for static and dynamic activities                             Pertinent Vitals/Pain Pain Assessment: No/denies pain Faces Pain Scale: No hurt Pain Intervention(s): Monitored during session  Home Living Family/patient expects to be discharged to:: Skilled nursing facility Living Arrangements: Alone   Type of Home: House Home Access: Stairs to enter Entrance Stairs-Rails: Can reach both Entrance Stairs-Number of Steps: 3 Home Layout: One level Home Equipment: [place] - 2 wheels;Cane - single point Additional Comments: Information taken from previous  admission as Jill Howell unable to provide information and no family present during evaluation    Prior Function Level of Independence: Needs assistance   Gait / Transfers Assistance Needed: Jill Howell likely ambulating with AD at SNF  ADL's / Homemaking Assistance Needed: Jill Howell likely requiring assist with some ADLs at SNF  Comments: Jill Howell was at [facility] but was planning to d/c from SNF tomorrow (12/27) so likely was seeing improvement.      Hand Dominance        Extremity/Trunk Assessment   Upper Extremity Assessment Upper Extremity Assessment: Generalized weakness    Lower Extremity Assessment Lower Extremity Assessment: Generalized weakness    Cervical / Trunk Assessment Cervical / Trunk Assessment: Kyphotic  Communication   Communication: No difficulties  Cognition Arousal/Alertness: Awake/alert Behavior During Therapy: (emotionally labile) Overall Cognitive Status: History of cognitive impairments - at baseline(dementia at baseline)                                        General Comments      Exercises     Assessment/Plan    Jill Howell Assessment Patient needs continued Jill Howell services  Jill Howell Problem List Decreased strength;Decreased activity tolerance;Decreased balance;Decreased mobility;Decreased cognition;Decreased knowledge of use of DME;Decreased safety awareness;Cardiopulmonary status limiting activity       Jill Howell Treatment Interventions DME instruction;Gait training;Stair training;Functional mobility training;Therapeutic activities;Therapeutic exercise;Balance training;Neuromuscular re-education;Cognitive remediation;Patient/family education;Wheelchair mobility training    Jill Howell Goals (Current goals can be found in the Care Plan section)  Acute Rehab Jill Howell Goals Patient Stated Goal: none stated Jill Howell Goal Formulation: With patient Time For Goal Achievement: 09/18/17 Potential to Achieve Goals: Good    Frequency Min 2X/week   Barriers to discharge Other (comment) Unsure of  amount of assist available    Co-evaluation               AM-PAC Jill Howell "6 Clicks" Daily Activity  Outcome Measure Difficulty turning over in bed (including adjusting bedclothes, sheets and blankets)?: Unable Difficulty moving from lying on back to sitting on the side of the bed? : Unable Difficulty sitting down on and standing up from a chair with arms (e.g., wheelchair, bedside commode, etc,.)?: Unable Help needed moving to and from a bed to chair (including a wheelchair)?: A Lot Help needed walking in hospital room?: A Little Help needed climbing 3-5 steps with a railing? : A Lot 6 Click Score: 10    End of Session Equipment Utilized During Treatment: Gait belt;Oxygen Activity Tolerance: Patient tolerated treatment well;Patient limited by fatigue Patient left: in bed;with call bell/phone within reach;with bed alarm set Nurse Communication: Mobility status;Other (comment)(SpO2) Jill Howell Visit Diagnosis: Muscle weakness (generalized) (M62.81);Unsteadiness on feet (R26.81);Other abnormalities of gait and mobility (R26.89)    Time: 0254-2706 Jill Howell Time Calculation (min) (ACUTE ONLY): 27 min   Charges:   Jill Howell Evaluation $Jill Howell Eval Low Complexity: 1 Low Jill Howell Treatments $Therapeutic Activity: 8-22 mins   Jill Howell G Codes:   Jill Howell G-Codes **NOT FOR INPATIENT CLASS** Functional Assessment Tool Used: AM-PAC 6 Clicks Basic Mobility;Clinical judgement Functional Limitation: Mobility: Walking and moving around Mobility: Walking and  Moving Around Current Status [phone]): At least 60 percent but less than 80 percent impaired, limited or restricted Mobility: Walking and Moving Around Goal Status [phone]): At least 20 percent but less than 40 percent impaired, limited or restricted    [name] Jill Howell, DPT 09/04/2017, 10:13 AM

## 2017-09-04 NOTE — Progress Notes (Signed)
Pharmacist - Prescriber Communication  Levofloxacin dose modified from 250 mg po daily to 500 mg po Q48H due to CrCl 10 to 30 mL/min, PNA indication, and concentration dependent PK.   Siriah Treat A. [place], [place].D., BCPS Clinical Pharmacist 09/04/2017 13:08

## 2017-09-05 DIAGNOSIS — H9190 Unspecified hearing loss, unspecified ear: Secondary | ICD-10-CM | POA: Diagnosis not present

## 2017-09-05 DIAGNOSIS — M6281 Muscle weakness (generalized): Secondary | ICD-10-CM | POA: Diagnosis not present

## 2017-09-05 DIAGNOSIS — K219 Gastro-esophageal reflux disease without esophagitis: Secondary | ICD-10-CM | POA: Diagnosis not present

## 2017-09-05 DIAGNOSIS — Z951 Presence of aortocoronary bypass graft: Secondary | ICD-10-CM | POA: Diagnosis not present

## 2017-09-05 DIAGNOSIS — I252 Old myocardial infarction: Secondary | ICD-10-CM | POA: Diagnosis not present

## 2017-09-05 DIAGNOSIS — E119 Type 2 diabetes mellitus without complications: Secondary | ICD-10-CM | POA: Diagnosis not present

## 2017-09-05 DIAGNOSIS — S299XXA Unspecified injury of thorax, initial encounter: Secondary | ICD-10-CM | POA: Diagnosis not present

## 2017-09-05 DIAGNOSIS — S0191XA Laceration without foreign body of unspecified part of head, initial encounter: Secondary | ICD-10-CM | POA: Diagnosis not present

## 2017-09-05 DIAGNOSIS — F329 Major depressive disorder, single episode, unspecified: Secondary | ICD-10-CM | POA: Diagnosis not present

## 2017-09-05 DIAGNOSIS — Z79899 Other long term (current) drug therapy: Secondary | ICD-10-CM | POA: Diagnosis not present

## 2017-09-05 DIAGNOSIS — J45909 Unspecified asthma, uncomplicated: Secondary | ICD-10-CM | POA: Diagnosis not present

## 2017-09-05 DIAGNOSIS — I509 Heart failure, unspecified: Secondary | ICD-10-CM | POA: Diagnosis not present

## 2017-09-05 DIAGNOSIS — Z7401 Bed confinement status: Secondary | ICD-10-CM | POA: Diagnosis not present

## 2017-09-05 DIAGNOSIS — J9601 Acute respiratory failure with hypoxia: Secondary | ICD-10-CM | POA: Diagnosis not present

## 2017-09-05 DIAGNOSIS — Z9889 Other specified postprocedural states: Secondary | ICD-10-CM | POA: Diagnosis not present

## 2017-09-05 DIAGNOSIS — E440 Moderate protein-calorie malnutrition: Secondary | ICD-10-CM

## 2017-09-05 DIAGNOSIS — Z23 Encounter for immunization: Secondary | ICD-10-CM | POA: Diagnosis not present

## 2017-09-05 DIAGNOSIS — Z515 Encounter for palliative care: Secondary | ICD-10-CM | POA: Diagnosis not present

## 2017-09-05 DIAGNOSIS — Y92129 Unspecified place in nursing home as the place of occurrence of the external cause: Secondary | ICD-10-CM | POA: Diagnosis not present

## 2017-09-05 DIAGNOSIS — Y999 Unspecified external cause status: Secondary | ICD-10-CM | POA: Diagnosis not present

## 2017-09-05 DIAGNOSIS — D649 Anemia, unspecified: Secondary | ICD-10-CM | POA: Diagnosis not present

## 2017-09-05 DIAGNOSIS — I2510 Atherosclerotic heart disease of native coronary artery without angina pectoris: Secondary | ICD-10-CM | POA: Diagnosis not present

## 2017-09-05 DIAGNOSIS — Z8673 Personal history of transient ischemic attack (TIA), and cerebral infarction without residual deficits: Secondary | ICD-10-CM | POA: Diagnosis not present

## 2017-09-05 DIAGNOSIS — W19XXXD Unspecified fall, subsequent encounter: Secondary | ICD-10-CM | POA: Diagnosis not present

## 2017-09-05 DIAGNOSIS — S0101XA Laceration without foreign body of scalp, initial encounter: Secondary | ICD-10-CM | POA: Diagnosis not present

## 2017-09-05 DIAGNOSIS — Z7982 Long term (current) use of aspirin: Secondary | ICD-10-CM | POA: Diagnosis not present

## 2017-09-05 DIAGNOSIS — I5031 Acute diastolic (congestive) heart failure: Secondary | ICD-10-CM | POA: Diagnosis not present

## 2017-09-05 DIAGNOSIS — F0280 Dementia in other diseases classified elsewhere without behavioral disturbance: Secondary | ICD-10-CM | POA: Diagnosis not present

## 2017-09-05 DIAGNOSIS — I5032 Chronic diastolic (congestive) heart failure: Secondary | ICD-10-CM | POA: Diagnosis not present

## 2017-09-05 DIAGNOSIS — I10 Essential (primary) hypertension: Secondary | ICD-10-CM | POA: Diagnosis not present

## 2017-09-05 DIAGNOSIS — I110 Hypertensive heart disease with heart failure: Secondary | ICD-10-CM | POA: Diagnosis not present

## 2017-09-05 DIAGNOSIS — F0390 Unspecified dementia without behavioral disturbance: Secondary | ICD-10-CM | POA: Diagnosis not present

## 2017-09-05 DIAGNOSIS — E785 Hyperlipidemia, unspecified: Secondary | ICD-10-CM | POA: Diagnosis not present

## 2017-09-05 DIAGNOSIS — W19XXXA Unspecified fall, initial encounter: Secondary | ICD-10-CM | POA: Diagnosis not present

## 2017-09-05 DIAGNOSIS — S0990XA Unspecified injury of head, initial encounter: Secondary | ICD-10-CM | POA: Diagnosis not present

## 2017-09-05 DIAGNOSIS — E039 Hypothyroidism, unspecified: Secondary | ICD-10-CM | POA: Diagnosis not present

## 2017-09-05 DIAGNOSIS — Z7189 Other specified counseling: Secondary | ICD-10-CM

## 2017-09-05 DIAGNOSIS — E538 Deficiency of other specified B group vitamins: Secondary | ICD-10-CM | POA: Diagnosis not present

## 2017-09-05 DIAGNOSIS — T796XXD Traumatic ischemia of muscle, subsequent encounter: Secondary | ICD-10-CM | POA: Diagnosis not present

## 2017-09-05 DIAGNOSIS — J189 Pneumonia, unspecified organism: Secondary | ICD-10-CM | POA: Diagnosis not present

## 2017-09-05 DIAGNOSIS — I480 Paroxysmal atrial fibrillation: Secondary | ICD-10-CM | POA: Diagnosis not present

## 2017-09-05 DIAGNOSIS — Y939 Activity, unspecified: Secondary | ICD-10-CM | POA: Diagnosis not present

## 2017-09-05 LAB — BLOOD GAS, VENOUS
ACID-BASE EXCESS: 2.6 mmol/L — AB (ref 0.0–2.0)
Bicarbonate: 28.8 mmol/L — ABNORMAL HIGH (ref 20.0–28.0)
PATIENT TEMPERATURE: 37
PH VEN: 7.36 (ref 7.250–7.430)
pCO2, Ven: 51 mmHg (ref 44.0–60.0)

## 2017-09-05 LAB — CULTURE, BLOOD (ROUTINE X 2)
Culture: NO GROWTH
Culture: NO GROWTH

## 2017-09-05 MED ORDER — ISOSORBIDE MONONITRATE ER 60 MG PO TB24: 60.0000 mg | ORAL_TABLET | Freq: Every day | ORAL | Status: DC

## 2017-09-05 MED ORDER — LEVOFLOXACIN 500 MG PO TABS: 500.0000 mg | ORAL_TABLET | ORAL | 0 refills | Status: AC

## 2017-09-05 NOTE — Progress Notes (Signed)
Pt 02 at rest with 2L 02- 93 Pt 02 with exertion with 2L 02- 92 Pt 02 with exertion on room air- 86%

## 2017-09-05 NOTE — Care Management Obs Status (Signed)
[place] NOTIFICATION   Patient Details  Name: Jill Howell MRN: [id number] Date of Birth: Pelley 29, 1920   Medicare Observation Status Notification Given:  Yes. Daughter Levada Dy signed    Shelbie Ammons, RN 09/05/2017, 1:27 PM

## 2017-09-05 NOTE — Progress Notes (Signed)
Patient is medically stable for D/C back to [facility] today. Per [facility] admissions coordinator at [facility] patient can come back to room 501. RN will call report and arrange EMS for transport. Clinical Education officer, museum (CSW) sent D/C orders to [facility] via [place]. Patient is aware of above. CSW left patient's sons [name] and Lenny Pastel voicemails. Please reconsult if future social work needs arise. CSW signing off.   [employer], LCSW [phone]

## 2017-09-05 NOTE — Discharge Summary (Signed)
[facility] NAME: Jill Howell    MR#:  [id number]  DATE OF BIRTH:  12-03-1918  DATE OF ADMISSION:  08/31/2017 ADMITTING PHYSICIAN: Gorden Harms, MD  DATE OF DISCHARGE: 09/05/2017  PRIMARY CARE PHYSICIAN: Tonia Ghent, MD    ADMISSION DIAGNOSIS:  HCAP (healthcare-associated pneumonia) [J18.9] Sepsis, due to unspecified organism ([place]) [A41.9] At risk for fluid volume overload [Z91.89]  DISCHARGE DIAGNOSIS:  Active Problems:   HCAP (healthcare-associated pneumonia)   Acute diastolic heart failure (Jill Howell)   Respiratory failure ([name])   DNR (do not resuscitate)   Palliative care encounter   At risk for fluid volume overload   Malnutrition of moderate degree   Encounter for hospice care discussion   SECONDARY DIAGNOSIS:   Past Medical History:  Diagnosis Date  . Allergic rhinitis, cause unspecified   . Anemia   . Asthma   . Atrial fibrillation (Jill Howell)    a. remote->no anticoagulation 2/2 h/o falls.  [name] CAD (coronary artery disease)    a. 2000 s/p MI/CABG x 4 (VG-PDA, VG-OM, VG-Diag, LIMA-LAD);  b. 2011 MI;  c. 08/2013 NSTEMI/Cath: LM 90-95, LAD 90/100p, LCX 99p, 143m, RCA 100ost, VG-PDA nl, VG-OM 100 (culprit w/ thrombus), VG-Diag nl, LIMA-LAD nl, EF 55-60%, mild inf HK, mod MR-->Med Rx.  . Carotid arterial disease (Jill Howell)    a. 05/2013 s/p L CEA.  . Chronic diastolic CHF (congestive heart failure) (Jill Howell)    a. 08/2013 Echo: EF 50-55%, no rwma, Gr2 DD, mod MR, mildly dil LA, PASP 5mmHg.  [name] Esophageal reflux   . History of TIA (transient ischemic attack)   . Hyperlipidemia   . Hypertension   . Hypothyroidism   . Myocardial infarction (Jill Howell)    08-2013  . Type II or unspecified type diabetes mellitus without mention of complication, not stated as uncontrolled   . Unspecified hearing loss     HOSPITAL COURSE:   83 year old female with history of essential hypertension who presented with shortness of breath.  1. Acute  hypoxic respiratory failure due to pneumonia and acute on chronic diastolic heart failure:: Patient will be discharged on Levaquin. She has diuresed. She will need oxygen upon discharge  2. Sepsis due to pneumonia which has resolved  3. Elevated troponin: This is due to demand ischemia. Patient was ruled out for ACS  4. Hyperthyroidism: Continue Synthroid  5. Essential hypertension: Patient will continue isosorbide, losartan  6. Hyperlipidemia: Continue simvastatin  DISCHARGE CONDITIONS AND DIET:   Stable for discharge on heart healthy diet  CONSULTS OBTAINED:    DRUG ALLERGIES:   Allergies  Allergen Reactions  . Furosemide Other (See Comments)    Patient prefers not to take med, weakness and frequent urination  . Penicillins Other (See Comments)    unknown    DISCHARGE MEDICATIONS:   Allergies as of 09/05/2017      Reactions   Furosemide Other (See Comments)   Patient prefers not to take med, weakness and frequent urination   Penicillins Other (See Comments)   unknown      Medication List    TAKE these medications   aspirin 81 MG EC tablet Take 81 mg by mouth daily.   atenolol 25 MG tablet Commonly known as:  TENORMIN Take 1 tablet (25 mg total) by mouth daily.   BIOTIN PO Take 1 capsule by mouth daily.   DIGOX 0.125 MG tablet Generic drug:  digoxin Take 1 tablet by mouth daily.   isosorbide mononitrate  60 MG 24 hr tablet Commonly known as:  IMDUR Take 1 tablet (60 mg total) by mouth daily. Start taking on:  09/06/2017 What changed:    medication strength  how much to take   levofloxacin 500 MG tablet Commonly known as:  LEVAQUIN Take 1 tablet (500 mg total) by mouth every other day for 4 days. Start taking on:  09/06/2017   levothyroxine 100 MCG tablet Commonly known as:  SYNTHROID, LEVOTHROID Take 1 tablet (100 mcg total) by mouth daily.   losartan 25 MG tablet Commonly known as:  COZAAR TAKE ONE TABLET EVERY DAY   nitroGLYCERIN 0.4  MG SL tablet Commonly known as:  NITROSTAT PLACE 1 TABLET UNDER THE TONGUE EVERY 5 MINUTES AS NEEDED FOR CHEST PAIN.   pantoprazole 40 MG tablet Commonly known as:  PROTONIX TAKE ONE TABLET EVERY DAY   PRESERVISION AREDS PO Take 1 tablet by mouth daily.   QUEtiapine 25 MG tablet Commonly known as:  SEROQUEL TAKE ONE TABLET BY MOUTH EVERY EVENING AT BEDTIME   simvastatin 40 MG tablet Commonly known as:  ZOCOR TAKE ONE TABLET EVERY DAY   vitamin B-12 1000 MCG tablet Commonly known as:  CYANOCOBALAMIN Take 1,000 mcg by mouth daily.         Today   CHIEF COMPLAINT:   No acute issues overnight   VITAL SIGNS:  Blood pressure (!) 127/44, pulse 87, temperature 98 F (36.7 C), temperature source Oral, resp. rate 18, height 5\' 1"  (1.549 m), weight 62.3 kg (137 lb 5.6 oz), SpO2 93 %.   REVIEW OF SYSTEMS:  Review of Systems  Constitutional: Negative for chills, fever and malaise/fatigue.  HENT: Negative.  Negative for ear discharge, ear pain, hearing loss, nosebleeds and sore throat.   Eyes: Positive for blurred vision. Negative for pain.  Respiratory: Positive for cough and shortness of breath (better). Negative for hemoptysis and wheezing.   Cardiovascular: Negative.  Negative for chest pain, palpitations and leg swelling.  Gastrointestinal: Negative.  Negative for abdominal pain, blood in stool, diarrhea, nausea and vomiting.  Genitourinary: Negative.  Negative for dysuria.  Musculoskeletal: Negative.  Negative for back pain.  Skin: Negative.   Neurological: Positive for weakness. Negative for dizziness, tremors, speech change, focal weakness, seizures and headaches.  Endo/Heme/Allergies: Negative.  Does not bruise/bleed easily.  Psychiatric/Behavioral: Negative.  Negative for depression, hallucinations and suicidal ideas.     PHYSICAL EXAMINATION:  GENERAL:  83 y.o.-year-old patient lying in the bed with no acute distress. frail NECK:  Supple, no jugular venous  distention. No thyroid enlargement, no tenderness.  LUNGS: Normal breath sounds bilaterally, no wheezing, rales,rhonchi  No use of accessory muscles of respiration.  CARDIOVASCULAR: S1, S2 normal. No murmurs, rubs, or gallops.  ABDOMEN: Soft, non-tender, non-distended. Bowel sounds present. No organomegaly or mass.  EXTREMITIES: No pedal edema, cyanosis, or clubbing.  PSYCHIATRIC: The patient is alert and oriented x 3.  SKIN: No obvious rash, lesion, or ulcer.   DATA REVIEW:   CBC Recent Labs  Lab 09/04/17 0416  WBC 8.7  HGB 9.3*  HCT 27.2*  PLT 242    Chemistries  Recent Labs  Lab 09/02/17 0415  09/04/17 0416  NA 134*   < > 134*  K 3.8   < > 3.5  CL 97*   < > 92*  CO2 27   < > 31  GLUCOSE 106*   < > 111*  BUN 29*   < > 36*  CREATININE 1.20*   < >  1.37*  CALCIUM 8.1*   < > 8.2*  AST 26  --   --   ALT 21  --   --   ALKPHOS 94  --   --   BILITOT 1.2  --   --    < > = values in this interval not displayed.    Cardiac Enzymes Recent Labs  Lab 09/01/17 1026 09/01/17 1519 09/01/17 2123  TROPONINI 0.40* 0.43* 0.31*    Microbiology Results  @MICRORSLT48 @  RADIOLOGY:  No results found.    Allergies as of 09/05/2017      Reactions   Furosemide Other (See Comments)   Patient prefers not to take med, weakness and frequent urination   Penicillins Other (See Comments)   unknown      Medication List    TAKE these medications   aspirin 81 MG EC tablet Take 81 mg by mouth daily.   atenolol 25 MG tablet Commonly known as:  TENORMIN Take 1 tablet (25 mg total) by mouth daily.   BIOTIN PO Take 1 capsule by mouth daily.   DIGOX 0.125 MG tablet Generic drug:  digoxin Take 1 tablet by mouth daily.   isosorbide mononitrate 60 MG 24 hr tablet Commonly known as:  IMDUR Take 1 tablet (60 mg total) by mouth daily. Start taking on:  09/06/2017 What changed:    medication strength  how much to take   levofloxacin 500 MG tablet Commonly known as:   LEVAQUIN Take 1 tablet (500 mg total) by mouth every other day for 4 days. Start taking on:  09/06/2017   levothyroxine 100 MCG tablet Commonly known as:  SYNTHROID, LEVOTHROID Take 1 tablet (100 mcg total) by mouth daily.   losartan 25 MG tablet Commonly known as:  COZAAR TAKE ONE TABLET EVERY DAY   nitroGLYCERIN 0.4 MG SL tablet Commonly known as:  NITROSTAT PLACE 1 TABLET UNDER THE TONGUE EVERY 5 MINUTES AS NEEDED FOR CHEST PAIN.   pantoprazole 40 MG tablet Commonly known as:  PROTONIX TAKE ONE TABLET EVERY DAY   PRESERVISION AREDS PO Take 1 tablet by mouth daily.   QUEtiapine 25 MG tablet Commonly known as:  SEROQUEL TAKE ONE TABLET BY MOUTH EVERY EVENING AT BEDTIME   simvastatin 40 MG tablet Commonly known as:  ZOCOR TAKE ONE TABLET EVERY DAY   vitamin B-12 1000 MCG tablet Commonly known as:  CYANOCOBALAMIN Take 1,000 mcg by mouth daily.           Management plans discussed with the patient and she is in agreement. Stable for discharge snf  Patient should follow up with pcp  CODE STATUS:     Code Status Orders  (From admission, onward)        Start     Ordered   09/01/17 0952  Do not attempt resuscitation (DNR)  Continuous    Question Answer Comment  In the event of cardiac or respiratory ARREST Do not call a "code blue"   In the event of cardiac or respiratory ARREST Do not perform Intubation, CPR, defibrillation or ACLS   In the event of cardiac or respiratory ARREST Use medication by any route, position, wound care, and other measures to relive pain and suffering. [place] use oxygen, suction and manual treatment of airway obstruction as needed for comfort.      09/01/17 0630    Code Status History    Date Active Date Inactive Code Status Order ID Comments User Context   08/31/2017 23:10 09/01/2017 09:52 Full  Code [id number]  Gorden Harms, MD Inpatient   08/12/2017 21:45 08/16/2017 21:55 Full Code [id number]  Nicholes Mango, MD Inpatient    04/22/2016 18:16 04/25/2016 17:36 Full Code [id number]  Lytle Butte, MD ED   02/20/2016 04:49 02/21/2016 18:31 Full Code [id number]  Toy Baker, MD ED   08/10/2013 13:25 08/12/2013 15:46 Full Code [id number]  Rogelia Mire, NP Inpatient      TOTAL TIME TAKING CARE OF THIS PATIENT: 38 minutes.    Note: This dictation was prepared with Dragon dictation along with smaller phrase technology. Any transcriptional errors that result from this process are unintentional.  Emersynn Deatley M.D on 09/05/2017 at 11:18 AM  Between 7am to 6pm - Pager - [phone] After 6pm go to [email or web address] - password [email or web address] [facility]  Office  [phone]  CC: Primary care physician; Tonia Ghent, MD

## 2017-09-05 NOTE — Consult Note (Signed)
   [facility] CM Inpatient Consult   09/05/2017  Jill Howell [birth year] [id number]   Patient screened for potential [facility] Management services. Patient is on the [facility] registry as a benefit of their Next Gen medicare . Electronic medical record reveals patient's discharge plan is to return to [facility]. [facility] Care Management services not appropriate at this time.Will make [facility] SNF Liaison aware to follow up with patient while at SNF.  If patient's post hospital needs change please place a [facility] Care Management consult. For questions please contact:   Treasure Ochs RN, [facility] Liaison  [phone]) Business Mobile [phone]) Toll free office

## 2017-09-05 NOTE — Progress Notes (Signed)
Spoke with son [name] on the phone for 15 min.  We discussed his mother's D-HF, mitral regurg, and pulmonary hypertension.  We talked about the fact that his mother's symptoms (fatigue, shortness of breath) will recur and will eventually take her life.  We discussed hospice care at home.  He anticipates she will discharge to [facility] from the hospital.  When she completes [facility] he will take her home.  He has hired two women to help care for her in her home.  He agrees that Hospice will be very beneficial at that time.  Rec:  D/C to SNF when appropriate with Palliative to follow.  Florentina Jenny, PA-C Palliative Medicine Pager: [phone]  Time 15 min.

## 2017-09-05 NOTE — Care Management Important Message (Signed)
Important Message  Patient Details  Name: Jill Howell MRN: [id number] Date of Birth: 1919-04-26   Medicare Important Message Given:  Yes  Telephone conversation with son Katalaya Beel    Shelbie Ammons, RN 09/05/2017, 9:22 AM

## 2017-09-05 NOTE — Progress Notes (Signed)
Physical Therapy Treatment Patient Details Name: Jill Howell MRN: [id number] DOB: August 31, 1919 Today's Date: 09/05/2017    History of Present Illness Pt is a 83 y/o F who presented from SNF with SOB, orthopnea.  Pt required BiPAP in the ED, was noted to be tachycardic, tachypneic, chest x-ray noted for pneumonia, BLE pitting edema.  Pt admitted for acute hypoxic respiratory failure most likely secondary to combination of acute on chronic diastolic congestive heart failure exacerbation and bilateral multifocal HCAP.  Pt's PMH includes dementia, anemia, a-fib, NSTEMI, CHF, TIA, MI, RTC repair.     PT Comments    Awake and ready for session.  Stating "I can't walk"  O2 sats on 2 lpm 92-97% on 2 lpm.  To edge of bed with min assist and increased time.  Sitting EOB x 5 minutes with one LOB post to left which she needed mod assist to recover.  Unsafe to be left unattended.  O2 removed per MD request for qualifying O2 sats.  Pt was able to stand with mod assist and ambulate 20' around bed to recliner with walker and min assist with several balance disturbances which required assist to prevent falls. Once sitting O2 at 85% on room air and 2 lpm was returned.  Pt stood one additional trial with mod assist for care as she was incontinent of urine during gait. SNF remains appropriate at this time.   Follow Up Recommendations  SNF     Equipment Recommendations       Recommendations for Other Services       Precautions / Restrictions Precautions Precautions: Fall;Other (comment) Precaution Comments: O2 Restrictions Weight Bearing Restrictions: No    Mobility  Bed Mobility Overal bed mobility: Needs Assistance Bed Mobility: Supine to Sit;Sit to Supine Rolling: Modified independent (Device/Increase time) Sidelying to sit: Min assist Supine to sit: Min assist;HOB elevated     General bed mobility comments: Increased time and effort with heavy use of bed rail.  Assist to scoot to EOB with use  of bed pad.    Transfers Overall transfer level: Needs assistance Equipment used: Rolling walker (2 wheeled) Transfers: Sit to/from Stand Sit to Stand: Min assist            Ambulation/Gait Ambulation/Gait assistance: Min assist Ambulation Distance (Feet): 25 Feet Assistive device: Rolling walker (2 wheeled) Gait Pattern/deviations: Decreased stride length;Trunk flexed;Staggering left;Staggering right   Gait velocity interpretation: Below normal speed for age/gender     Stairs            Wheelchair Mobility    Modified Rankin (Stroke Patients Only)       Balance Overall balance assessment: Needs assistance Sitting-balance support: No upper extremity supported;Feet supported Sitting balance-Leahy Scale: Fair   Postural control: Posterior lean Standing balance support: Bilateral upper extremity supported;During functional activity Standing balance-Leahy Scale: Poor Standing balance comment: Pt relies on UE support for static and dynamic activities                            Cognition Arousal/Alertness: Awake/alert Behavior During Therapy: WFL for tasks assessed/performed Overall Cognitive Status: History of cognitive impairments - at baseline                                        Exercises Other Exercises Other Exercises: Sat EOB x 5 minutes for monitoring O2 and consult  with hospitalist.    General Comments        Pertinent Vitals/Pain Pain Assessment: No/denies pain    Home Living                      Prior Function            PT Goals (current goals can now be found in the care plan section) Progress towards PT goals: Progressing toward goals    Frequency    Min 2X/week      PT Plan Current plan remains appropriate    Co-evaluation              AM-PAC PT "6 Clicks" Daily Activity  Outcome Measure  Difficulty turning over in bed (including adjusting bedclothes, sheets and blankets)?:  Unable Difficulty moving from lying on back to sitting on the side of the bed? : Unable Difficulty sitting down on and standing up from a chair with arms (e.g., wheelchair, bedside commode, etc,.)?: Unable Help needed moving to and from a bed to chair (including a wheelchair)?: A Little Help needed walking in hospital room?: A Little Help needed climbing 3-5 steps with a railing? : A Lot 6 Click Score: 11    End of Session Equipment Utilized During Treatment: Gait belt;Oxygen Activity Tolerance: Patient tolerated treatment well;Patient limited by fatigue Patient left: with call bell/phone within reach;in chair;with chair alarm set Nurse Communication: Other (comment)       Time: 1050-1110 PT Time Calculation (min) (ACUTE ONLY): 20 min  Charges:  $Gait Training: 8-22 mins                    G Codes:       Chesley Noon, PTA 09/05/17, 11:22 AM

## 2017-09-05 NOTE — Progress Notes (Signed)
Pt to be discharged via non-emergent [facility] to [facility]. Report called to accepting RN. IV removed, pt on 2L 02 and in no distress. Ammie Dalton, RN

## 2017-09-11 ENCOUNTER — Other Ambulatory Visit: Payer: Self-pay | Admitting: *Deleted

## 2017-09-11 NOTE — Patient Outreach (Signed)
[facility]) Care Management  09/11/2017  Jill Howell 1918-11-30 [id number]   Met with Jill Howell, SW at facility. She reports that patient discharge plan is to go home with caregivers and support from son and other family and friends.  No discharge date is set as of yet.   Met with patient and a neighbor in the room.  Gave a [facility] brochure for patient son.   Plan to follow up at next facility visit.  Royetta Crochet. Laymond Purser, RN, BSN, [place] [phone]) Business Cell  [phone]) Toll Free Office

## 2017-09-14 NOTE — Progress Notes (Deleted)
   Patient ID: Jill Howell, female    DOB: 1919-03-10, 84 y.o.   MRN: [id number]  HPI  Jill Howell is a 84 y/o female with a history of   Echo report from 09/02/17 reviewed and showed an EF of 50-55% along with mild Jill, moderate MR and elevated PA pressure of 60 mm Hg.   Admitted 08/31/17 due to pneumonia and HF exacerbation. Antibiotics were given. Elevated troponin thought to be due to demand ischemia. Discharged after 5 days. Admitted 08/12/17 due to acute rhabdomyolysis status post fall. IV fluids given. Discharged to SNF after 4 days.   She presents today for her initial visit with a chief complaint of     Review of Systems    Physical Exam  Assessment & Plan:  1: Chronic heart failure with preserved ejection fraction- - NYHA class - saw cardiologist Rockey Situ) 06/10/17 - BNP from 09/01/17 was 2822.0  2: HTN- - saw PCP -BMP on 09/04/17 reviewed and showed sodium 134, potassium 3.5 and GFR 31.   3: Paroxymal atrial fibrillation-

## 2017-09-15 ENCOUNTER — Ambulatory Visit: Payer: Self-pay | Admitting: Family

## 2017-09-15 ENCOUNTER — Telehealth: Payer: Self-pay | Admitting: Family

## 2017-09-15 NOTE — Telephone Encounter (Signed)
Patient did not show for her [facility] Failure [facility] appointment on 09/15/17. Will attempt to reschedule.  

## 2017-09-16 ENCOUNTER — Ambulatory Visit: Payer: No Typology Code available for payment source | Attending: Family | Admitting: Family

## 2017-09-16 ENCOUNTER — Encounter: Payer: Self-pay | Admitting: Family

## 2017-09-16 ENCOUNTER — Other Ambulatory Visit: Payer: Self-pay | Admitting: Family

## 2017-09-16 VITALS — BP 129/47 | HR 64 | Resp 18 | Ht 64.0 in | Wt 131.4 lb

## 2017-09-16 DIAGNOSIS — Z79899 Other long term (current) drug therapy: Secondary | ICD-10-CM | POA: Insufficient documentation

## 2017-09-16 DIAGNOSIS — I110 Hypertensive heart disease with heart failure: Secondary | ICD-10-CM | POA: Diagnosis not present

## 2017-09-16 DIAGNOSIS — K219 Gastro-esophageal reflux disease without esophagitis: Secondary | ICD-10-CM | POA: Insufficient documentation

## 2017-09-16 DIAGNOSIS — I10 Essential (primary) hypertension: Secondary | ICD-10-CM

## 2017-09-16 DIAGNOSIS — J45909 Unspecified asthma, uncomplicated: Secondary | ICD-10-CM | POA: Diagnosis not present

## 2017-09-16 DIAGNOSIS — Z9889 Other specified postprocedural states: Secondary | ICD-10-CM | POA: Insufficient documentation

## 2017-09-16 DIAGNOSIS — Z951 Presence of aortocoronary bypass graft: Secondary | ICD-10-CM | POA: Insufficient documentation

## 2017-09-16 DIAGNOSIS — H9190 Unspecified hearing loss, unspecified ear: Secondary | ICD-10-CM | POA: Insufficient documentation

## 2017-09-16 DIAGNOSIS — I252 Old myocardial infarction: Secondary | ICD-10-CM | POA: Insufficient documentation

## 2017-09-16 DIAGNOSIS — Z7982 Long term (current) use of aspirin: Secondary | ICD-10-CM | POA: Insufficient documentation

## 2017-09-16 DIAGNOSIS — Z8673 Personal history of transient ischemic attack (TIA), and cerebral infarction without residual deficits: Secondary | ICD-10-CM | POA: Insufficient documentation

## 2017-09-16 DIAGNOSIS — E039 Hypothyroidism, unspecified: Secondary | ICD-10-CM | POA: Insufficient documentation

## 2017-09-16 DIAGNOSIS — I480 Paroxysmal atrial fibrillation: Secondary | ICD-10-CM | POA: Diagnosis not present

## 2017-09-16 DIAGNOSIS — E119 Type 2 diabetes mellitus without complications: Secondary | ICD-10-CM | POA: Insufficient documentation

## 2017-09-16 DIAGNOSIS — E785 Hyperlipidemia, unspecified: Secondary | ICD-10-CM | POA: Insufficient documentation

## 2017-09-16 DIAGNOSIS — I5032 Chronic diastolic (congestive) heart failure: Secondary | ICD-10-CM | POA: Diagnosis not present

## 2017-09-16 DIAGNOSIS — I2510 Atherosclerotic heart disease of native coronary artery without angina pectoris: Secondary | ICD-10-CM | POA: Diagnosis not present

## 2017-09-16 NOTE — Progress Notes (Signed)
Patient ID: Jill Howell, female    DOB: 03/21/1919, 84 y.o.   MRN: [id number]  HPI  Jill Howell is a 84 y/o female with a history of asthma, carotid disease, CAD, diabetes, hyperlipidemia, HTN, thyroid disease, atrial fibrillation, TIA and heart failure.   Echo report from 09/02/17 reviewed and showed an EF of 50-55% along with mild Jill, moderate MR and elevated PA pressure of 60 mm Hg.   Admitted 08/31/17 due to pneumonia and HF exacerbation. Antibiotics were given. Elevated troponin thought to be due to demand ischemia. Discharged after 5 days. Admitted 08/12/17 due to acute rhabdomyolysis status post fall. IV fluids given. Discharged to SNF after 4 days.   Jill Howell presents today for Jill Howell initial visit with a chief complaint of moderate fatigue upon minimal exertion. This has been chronic in nature having been present for several years with varying levels of severity. Jill Howell denies any chest pain, shortness of breath, edema, palpitations, abdominal distention, difficulty sleeping or light-headedness. Unsure of weight as Jill Howell isn't being weighed daily at the facility.    Past Medical History:  Diagnosis Date  . Allergic rhinitis, cause unspecified   . Anemia   . Asthma   . Atrial fibrillation ([place])    a. remote->no anticoagulation 2/2 h/o falls.  [name] CAD (coronary artery disease)    a. 2000 s/p MI/CABG x 4 (VG-PDA, VG-OM, VG-Diag, LIMA-LAD);  b. 2011 MI;  c. 08/2013 NSTEMI/Cath: LM 90-95, LAD 90/100p, LCX 99p, 140m, RCA 100ost, VG-PDA nl, VG-OM 100 (culprit w/ thrombus), VG-Diag nl, LIMA-LAD nl, EF 55-60%, mild inf HK, mod MR-->Med Rx.  . Carotid arterial disease ([place])    a. 05/2013 s/p L CEA.  . Chronic diastolic CHF (congestive heart failure) ([place])    a. 08/2013 Echo: EF 50-55%, no rwma, Gr2 DD, mod MR, mildly dil LA, PASP 75mmHg.  [name] Esophageal reflux   . History of TIA (transient ischemic attack)   . Hyperlipidemia   . Hypertension   . Hypothyroidism   . Myocardial infarction ([place])    08-2013  .  Type II or unspecified type diabetes mellitus without mention of complication, not stated as uncontrolled   . Unspecified hearing loss    Past Surgical History:  Procedure Laterality Date  . ABDOMINAL HYSTERECTOMY    . Bladder tack     x 2  . CARDIAC CATHETERIZATION  09/07/1999   EF 25%. THERE IS SEVERE MITRAL ANNULAR CALIFICATION, WITH  3 + INSUFFICIENCY  . CARDIOVASCULAR STRESS TEST  06/30/2007   EF 63%, NO ISCHEMIA  . CAROTID ENDARTERECTOMY Left 05-19-13   cea  . CHOLECYSTECTOMY  10/2001  . CORONARY ARTERY BYPASS GRAFT  2000   4 vessel  . DILATION AND CURETTAGE OF UTERUS    . ENDARTERECTOMY Left 05/19/2013   Procedure: ENDARTERECTOMY CAROTID-LEFT;  Surgeon: Elam Dutch, MD;  Location: [facility];  Service: Vascular;  Laterality: Left;  . EYE SURGERY Bilateral    Cataract  . HEMORRHOID SURGERY    . LEFT HEART CATH  Dec. 2, 2014   With  Coronary Graft  Angiogram  . LEFT HEART CATHETERIZATION WITH CORONARY/GRAFT ANGIOGRAM  08/10/2013   Procedure: LEFT HEART CATHETERIZATION WITH Beatrix Fetters;  Surgeon: Peter M Martinique, MD;  Location: [facility] CATH LAB;  Service: Cardiovascular;;  . PARTIAL HYSTERECTOMY    . RECTOCELE REPAIR     x 2  . ROTATOR CUFF REPAIR    . TONSILLECTOMY    . US ECHOCARDIOGRAPHY  01/17/2005   EF 55-60%  Family History  Problem Relation Age of Onset  . Heart attack Father        X's 3  . Heart disease Father   . Heart disease Son        before age 65  . Cancer Brother   . Hypertension Mother   . Alzheimer's disease Mother    Social History   Tobacco Use  . Smoking status: Never Smoker  . Smokeless tobacco: Never Used  Substance Use Topics  . Alcohol use: No    Alcohol/week: 0.0 oz   Allergies  Allergen Reactions  . Furosemide Other (See Comments)    Patient prefers not to take med, weakness and frequent urination  . Penicillins Other (See Comments)    unknown   Prior to Admission medications   Medication Sig Start Date End Date Taking?  Authorizing Provider  aspirin 81 MG EC tablet Take 81 mg by mouth daily.     Yes [provider]  atenolol (TENORMIN) 25 MG tablet Take 1 tablet (25 mg total) by mouth daily. 06/10/17  Yes Minna Merritts, MD  BIOTIN PO Take 1 capsule by mouth daily.    Yes [provider]  [place] 125 MCG tablet Take 1 tablet by mouth daily. 05/30/17  Yes [provider]  isosorbide mononitrate (IMDUR) 60 MG 24 hr tablet Take 1 tablet (60 mg total) by mouth daily. Patient taking differently: Take 30 mg by mouth daily.  09/06/17  Yes Mody, Ulice Bold, MD  levothyroxine (SYNTHROID, LEVOTHROID) 100 MCG tablet Take 1 tablet (100 mcg total) by mouth daily. 01/08/17  Yes Tonia Ghent, MD  losartan (COZAAR) 25 MG tablet TAKE ONE TABLET EVERY DAY 02/04/17  Yes Gollan, Kathlene November, MD  Multiple Vitamins-Minerals (PRESERVISION AREDS PO) Take 1 tablet by mouth daily.   Yes [provider]  nitroGLYCERIN (NITROSTAT) 0.4 MG SL tablet PLACE 1 TABLET UNDER THE TONGUE EVERY 5 MINUTES AS NEEDED FOR CHEST PAIN. 03/01/15  Yes Darlin Coco, MD  pantoprazole (PROTONIX) 40 MG tablet TAKE ONE TABLET EVERY DAY Patient taking differently: Take one tablet by mouth twice daily 04/09/17  Yes Tonia Ghent, MD  QUEtiapine (SEROQUEL) 25 MG tablet TAKE ONE TABLET BY MOUTH EVERY EVENING AT BEDTIME 01/08/17  Yes Tonia Ghent, MD  simvastatin (ZOCOR) 40 MG tablet TAKE ONE TABLET EVERY DAY 05/30/17  Yes Tonia Ghent, MD  vitamin B-12 (CYANOCOBALAMIN) 1000 MCG tablet Take 1,000 mcg by mouth daily.    Yes [provider]    Review of Systems  Constitutional: Positive for fatigue. Negative for appetite change.  HENT: Positive for hearing loss. Negative for congestion and postnasal drip.   Eyes: Negative.   Respiratory: Negative for cough and shortness of breath.   Cardiovascular: Negative for chest pain, palpitations and leg swelling.  Gastrointestinal: Negative for abdominal distention and  abdominal pain.  Endocrine: Negative.   Genitourinary: Negative.   Musculoskeletal: Negative for back pain and neck pain.  Skin: Negative.   Allergic/Immunologic: Negative.   Neurological: Negative for dizziness and light-headedness.  Hematological: Negative for adenopathy. Does not bruise/bleed easily.  Psychiatric/Behavioral: Negative for dysphoric mood and sleep disturbance (wearing oxygen at bedtime). The patient is not nervous/anxious.    Vitals:   09/16/17 1335  BP: (!) 129/47  Pulse: 64  Resp: 18  SpO2: 96%  Weight: 131 lb 6 oz (59.6 kg)  Height: 5\' 4"  (1.626 m)   Wt Readings from Last 3 Encounters:  09/16/17 131 lb 6 oz (  59.6 kg)  08/31/17 137 lb 5.6 oz (62.3 kg)  08/12/17 130 lb (59 kg)   Lab Results  Component Value Date   CREATININE 1.37 (H) 09/04/2017   CREATININE 1.16 (H) 09/03/2017   CREATININE 1.20 (H) 09/02/2017   Physical Exam  Constitutional: Jill Howell is oriented to person, place, and time. Jill Howell appears well-developed and well-nourished.  HENT:  Head: Normocephalic and atraumatic.  Right Ear: Decreased hearing is noted.  Left Ear: Decreased hearing is noted.  Neck: Normal range of motion. Neck supple. No JVD present.  Cardiovascular: Normal rate and regular rhythm.  Pulmonary/Chest: Effort normal. Jill Howell has no wheezes. Jill Howell has no rales.  Abdominal: Soft. Jill Howell exhibits no distension. There is no tenderness.  Musculoskeletal: Jill Howell exhibits no edema or tenderness.  Neurological: Jill Howell is alert and oriented to person, place, and time.  Skin: Skin is warm and dry.  Psychiatric: Jill Howell has a normal mood and affect. Jill Howell behavior is normal. Thought content normal.  Nursing note and vitals reviewed.   Assessment & Plan:  1: Chronic heart failure with preserved ejection fraction- - NYHA class III - euvolemic today - not being weighed daily at [facility] so an order was placed for patient to be weighed daily and to call for an overnight weight gain of >2 pounds or a  weekly weight gain of >5 pounds - not using salt but does have a decreased appetite - saw cardiologist Rockey Situ) 06/10/17 - BNP from 09/01/17 was 2822.0  2: HTN- - BP looks good today - sees PCP at the facility  - BMP on 09/04/17 reviewed and showed sodium 134, potassium 3.5 and GFR 31.   3: Paroxymal atrial fibrillation- - currently rate controlled  - taking atenolol & digoxin - dig level on 08/31/17 was 0.7  Facility medication list was reviewed.  Return in 1 month or sooner for any questions/problems before then.

## 2017-09-16 NOTE — Patient Instructions (Signed)
Begin weighing daily and call for an overnight weight gain of > 2 pounds or a weekly weight gain of >5 pounds. 

## 2017-09-22 DIAGNOSIS — I10 Essential (primary) hypertension: Secondary | ICD-10-CM | POA: Diagnosis not present

## 2017-09-22 DIAGNOSIS — I509 Heart failure, unspecified: Secondary | ICD-10-CM | POA: Diagnosis not present

## 2017-09-22 DIAGNOSIS — S0191XA Laceration without foreign body of unspecified part of head, initial encounter: Secondary | ICD-10-CM | POA: Diagnosis not present

## 2017-09-22 DIAGNOSIS — F329 Major depressive disorder, single episode, unspecified: Secondary | ICD-10-CM | POA: Diagnosis not present

## 2017-09-22 DIAGNOSIS — J189 Pneumonia, unspecified organism: Secondary | ICD-10-CM | POA: Diagnosis not present

## 2017-09-25 ENCOUNTER — Emergency Department: Payer: Medicare Other

## 2017-09-25 ENCOUNTER — Emergency Department
Admission: EM | Admit: 2017-09-25 | Discharge: 2017-09-25 | Disposition: A | Discharge status: Home / Self Care | Payer: Medicare Other | Attending: Emergency Medicine | Admitting: Emergency Medicine

## 2017-09-25 ENCOUNTER — Other Ambulatory Visit: Payer: Self-pay

## 2017-09-25 ENCOUNTER — Encounter: Payer: Self-pay | Admitting: Emergency Medicine

## 2017-09-25 DIAGNOSIS — Y999 Unspecified external cause status: Secondary | ICD-10-CM | POA: Insufficient documentation

## 2017-09-25 DIAGNOSIS — Z23 Encounter for immunization: Secondary | ICD-10-CM | POA: Insufficient documentation

## 2017-09-25 DIAGNOSIS — I2510 Atherosclerotic heart disease of native coronary artery without angina pectoris: Secondary | ICD-10-CM | POA: Diagnosis not present

## 2017-09-25 DIAGNOSIS — Z951 Presence of aortocoronary bypass graft: Secondary | ICD-10-CM | POA: Insufficient documentation

## 2017-09-25 DIAGNOSIS — W19XXXA Unspecified fall, initial encounter: Secondary | ICD-10-CM | POA: Insufficient documentation

## 2017-09-25 DIAGNOSIS — S299XXA Unspecified injury of thorax, initial encounter: Secondary | ICD-10-CM | POA: Diagnosis not present

## 2017-09-25 DIAGNOSIS — Y92129 Unspecified place in nursing home as the place of occurrence of the external cause: Secondary | ICD-10-CM | POA: Insufficient documentation

## 2017-09-25 DIAGNOSIS — S0101XA Laceration without foreign body of scalp, initial encounter: Secondary | ICD-10-CM | POA: Diagnosis not present

## 2017-09-25 DIAGNOSIS — E119 Type 2 diabetes mellitus without complications: Secondary | ICD-10-CM | POA: Insufficient documentation

## 2017-09-25 DIAGNOSIS — F0390 Unspecified dementia without behavioral disturbance: Secondary | ICD-10-CM | POA: Insufficient documentation

## 2017-09-25 DIAGNOSIS — Z7982 Long term (current) use of aspirin: Secondary | ICD-10-CM | POA: Insufficient documentation

## 2017-09-25 DIAGNOSIS — J45909 Unspecified asthma, uncomplicated: Secondary | ICD-10-CM | POA: Insufficient documentation

## 2017-09-25 DIAGNOSIS — I5032 Chronic diastolic (congestive) heart failure: Secondary | ICD-10-CM | POA: Insufficient documentation

## 2017-09-25 DIAGNOSIS — Y939 Activity, unspecified: Secondary | ICD-10-CM | POA: Insufficient documentation

## 2017-09-25 DIAGNOSIS — I110 Hypertensive heart disease with heart failure: Secondary | ICD-10-CM | POA: Diagnosis not present

## 2017-09-25 DIAGNOSIS — R296 Repeated falls: Secondary | ICD-10-CM

## 2017-09-25 HISTORY — DX: Unspecified dementia, unspecified severity, without behavioral disturbance, psychotic disturbance, mood disturbance, and anxiety: F03.90

## 2017-09-25 LAB — URINALYSIS, COMPLETE (UACMP) WITH MICROSCOPIC
BACTERIA UA: NONE SEEN
Bilirubin Urine: NEGATIVE
Glucose, UA: NEGATIVE mg/dL
Hgb urine dipstick: NEGATIVE
Ketones, ur: NEGATIVE mg/dL
Leukocytes, UA: NEGATIVE
Nitrite: NEGATIVE
PROTEIN: NEGATIVE mg/dL
RBC / HPF: NONE SEEN RBC/hpf (ref 0–5)
SPECIFIC GRAVITY, URINE: 1.012 (ref 1.005–1.030)
SQUAMOUS EPITHELIAL / LPF: NONE SEEN
pH: 5 (ref 5.0–8.0)

## 2017-09-25 MED ORDER — LIDOCAINE HCL (PF) 1 % IJ SOLN
5.0000 mL | Freq: Once | INTRAMUSCULAR | Status: AC
  Administered 2017-09-25: 5 mL via INTRADERMAL
  Filled 2017-09-25: qty 5

## 2017-09-25 MED ORDER — TETANUS-DIPHTH-ACELL PERTUSSIS 5-2.5-18.5 LF-MCG/0.5 IM SUSP
0.5000 mL | Freq: Once | INTRAMUSCULAR | Status: AC
  Administered 2017-09-25: 0.5 mL via INTRAMUSCULAR
  Filled 2017-09-25: qty 0.5

## 2017-09-25 NOTE — Discharge Instructions (Addendum)
There was a laceration on the back of the scalp, which was repaired with 5 staples today. These will need to be removed by your doctor in 1 week.

## 2017-09-25 NOTE — ED Notes (Signed)
[facility] called for transport to [facility]

## 2017-09-25 NOTE — ED Notes (Signed)
Called report. Was given to [employer] at facility. Pt signed hardcopy of E-signature. Place in file.

## 2017-09-25 NOTE — ED Notes (Addendum)
Placed pt on 3L nasal cannula, pt O2 sat was 88% RA. Pt now at 98%

## 2017-09-25 NOTE — ED Triage Notes (Signed)
Patient to ER via [facility] from [facility] for c/o unwitnessed fall. Patient has laceration to left posterior head with minimal bleeding present. Patient has h/o dementia and is pleasantly confused. Able to answer questions appropriately. No obvious signs of any other injury. Patient has no other complaints.

## 2017-09-25 NOTE — ED Provider Notes (Signed)
[facility] Emergency Department Provider Note  ____________________________________________  Time seen: Approximately 7:49 PM  I have reviewed the triage vital signs and the nursing notes.   HISTORY  Chief Complaint Fall  Level 5 Caveat: Portions of the History and Physical are unable to be obtained due to patient being a poor historiandue to chronic dementia   HPI Jill Howell is a 84 y.o. female sent to the ED from her nursing home due to an unwitnessed fall. Patient denies any complaints.     Past Medical History:  Diagnosis Date  . Allergic rhinitis, cause unspecified   . Anemia   . Asthma   . Atrial fibrillation ([place])    a. remote->no anticoagulation 2/2 h/o falls.  [name] CAD (coronary artery disease)    a. 2000 s/p MI/CABG x 4 (VG-PDA, VG-OM, VG-Diag, LIMA-LAD);  b. 2011 MI;  c. 08/2013 NSTEMI/Cath: LM 90-95, LAD 90/100p, LCX 99p, 173m, RCA 100ost, VG-PDA nl, VG-OM 100 (culprit w/ thrombus), VG-Diag nl, LIMA-LAD nl, EF 55-60%, mild inf HK, mod MR-->Med Rx.  . Carotid arterial disease ([place])    a. 05/2013 s/p L CEA.  . Chronic diastolic CHF (congestive heart failure) ([place])    a. 08/2013 Echo: EF 50-55%, no rwma, Gr2 DD, mod MR, mildly dil LA, PASP 90mmHg.  [name] Dementia   . Esophageal reflux   . History of TIA (transient ischemic attack)   . Hyperlipidemia   . Hypertension   . Hypothyroidism   . Myocardial infarction ([place])    08-2013  . Type II or unspecified type diabetes mellitus without mention of complication, not stated as uncontrolled   . Unspecified hearing loss      Patient Active Problem List   Diagnosis Date Noted  . HTN (hypertension) 09/16/2017  . Malnutrition of moderate degree 09/05/2017  . Encounter for hospice care discussion   . DNR (do not resuscitate)   . Palliative care encounter   . Advance care planning 01/08/2017  . Cough 04/28/2016  . Hyperkalemia 04/22/2016  . Pressure ulcer 02/20/2016  . Atherosclerosis of native  coronary artery of native heart with stable angina pectoris ([place]) 02/19/2016  . Dementia 02/19/2016  . Hyponatremia 02/19/2016  . Chronic diastolic congestive heart failure ([place]) 02/19/2016  . Carotid stenosis 06/09/2014  . Asthmatic bronchitis , chronic ([place]) 03/24/2014  . Family discord 02/18/2014  . Malaise and fatigue 12/01/2013  . At risk for falls 08/12/2013  . Anemia   . TIA (transient ischemic attack) 05/17/2013  . Occlusion and stenosis of carotid artery without mention of cerebral infarction 05/13/2013  . Fall at home 05/11/2013  . PAF (paroxysmal atrial fibrillation) ([place]) 08/11/2012  . Carotid artery disease ([place]) 08/19/2011  . Dermatitis 05/09/2011  . Hypothyroidism 09/15/2007  . Hyperlipidemia 09/15/2007  . HEARING IMPAIRMENT 09/15/2007  . Coronary atherosclerosis 09/15/2007  . ALLERGIC RHINITIS 09/15/2007  . GERD 09/15/2007     Past Surgical History:  Procedure Laterality Date  . ABDOMINAL HYSTERECTOMY    . Bladder tack     x 2  . CARDIAC CATHETERIZATION  09/07/1999   EF 25%. THERE IS SEVERE MITRAL ANNULAR CALIFICATION, WITH  3 + INSUFFICIENCY  . CARDIOVASCULAR STRESS TEST  06/30/2007   EF 63%, NO ISCHEMIA  . CAROTID ENDARTERECTOMY Left 05-19-13   cea  . CHOLECYSTECTOMY  10/2001  . CORONARY ARTERY BYPASS GRAFT  2000   4 vessel  . DILATION AND CURETTAGE OF UTERUS    . ENDARTERECTOMY Left 05/19/2013   Procedure: ENDARTERECTOMY CAROTID-LEFT;  Surgeon: Elam Dutch, MD;  Location: [facility];  Service: Vascular;  Laterality: Left;  . EYE SURGERY Bilateral    Cataract  . HEMORRHOID SURGERY    . LEFT HEART CATH  Dec. 2, 2014   With  Coronary Graft  Angiogram  . LEFT HEART CATHETERIZATION WITH CORONARY/GRAFT ANGIOGRAM  08/10/2013   Procedure: LEFT HEART CATHETERIZATION WITH Beatrix Fetters;  Surgeon: Peter M Martinique, MD;  Location: [facility] CATH LAB;  Service: Cardiovascular;;  . PARTIAL HYSTERECTOMY    . RECTOCELE REPAIR     x 2  . ROTATOR CUFF REPAIR    .  TONSILLECTOMY    . US ECHOCARDIOGRAPHY  01/17/2005   EF 55-60%     Prior to Admission medications   Medication Sig Start Date End Date Taking? Authorizing Provider  aspirin 81 MG EC tablet Take 81 mg by mouth daily.      [provider]  atenolol (TENORMIN) 25 MG tablet Take 1 tablet (25 mg total) by mouth daily. 06/10/17   Minna Merritts, MD  BIOTIN PO Take 1 capsule by mouth daily.     [provider]  [place] 125 MCG tablet Take 1 tablet by mouth daily. 05/30/17   [provider]  isosorbide mononitrate (IMDUR) 60 MG 24 hr tablet Take 1 tablet (60 mg total) by mouth daily. Patient taking differently: Take 30 mg by mouth daily.  09/06/17   Bettey Costa, MD  levothyroxine (SYNTHROID, LEVOTHROID) 100 MCG tablet Take 1 tablet (100 mcg total) by mouth daily. 01/08/17   Tonia Ghent, MD  losartan (COZAAR) 25 MG tablet TAKE ONE TABLET EVERY DAY 02/04/17   Minna Merritts, MD  Multiple Vitamins-Minerals (PRESERVISION AREDS PO) Take 1 tablet by mouth daily.    [provider]  nitroGLYCERIN (NITROSTAT) 0.4 MG SL tablet PLACE 1 TABLET UNDER THE TONGUE EVERY 5 MINUTES AS NEEDED FOR CHEST PAIN. 03/01/15   Darlin Coco, MD  pantoprazole (PROTONIX) 40 MG tablet TAKE ONE TABLET EVERY DAY Patient taking differently: Take one tablet by mouth twice daily 04/09/17   Tonia Ghent, MD  QUEtiapine (SEROQUEL) 25 MG tablet TAKE ONE TABLET BY MOUTH EVERY EVENING AT BEDTIME 01/08/17   Tonia Ghent, MD  simvastatin (ZOCOR) 40 MG tablet TAKE ONE TABLET EVERY DAY 05/30/17   Tonia Ghent, MD  vitamin B-12 (CYANOCOBALAMIN) 1000 MCG tablet Take 1,000 mcg by mouth daily.     [provider]     Allergies Furosemide and Penicillins   Family History  Problem Relation Age of Onset  . Heart attack Father        X's 3  . Heart disease Father   . Heart disease Son        before age 66  . Cancer Brother   . Hypertension Mother   . Alzheimer's disease Mother      Social History Social History   Tobacco Use  . Smoking status: Never Smoker  . Smokeless tobacco: Never Used  Substance Use Topics  . Alcohol use: No    Alcohol/week: 0.0 oz  . Drug use: No    Review of Systems unable to reliably obtained due to poor historian and dementia ____________________________________________   PHYSICAL EXAM:  VITAL SIGNS: ED Triage Vitals  Enc Vitals Group     BP 09/25/17 1919 (!) 182/69     Pulse Rate 09/25/17 1919 81     Resp 09/25/17 1919 (!) 23     Temp 09/25/17 1919 97.6  F (36.4 C)     Temp Source 09/25/17 1919 Oral     SpO2 09/25/17 1914 92 %     Weight 09/25/17 1920 131 lb (59.4 kg)     Height 09/25/17 1920 5\' 4"  (1.626 m)     Head Circumference --      Peak Flow --      Pain Score 09/25/17 1918 0     Pain Loc --      Pain Edu? --      Excl. in [place]? --     Vital signs reviewed, nursing assessments reviewed.   Constitutional:   Alert and oriented to self. Well appearing and in no distress. Eyes:   No scleral icterus.  EOMI. No nystagmus. No conjunctival pallor. PERRL. ENT   Head:   Normocephalic with 4 cm linear scalp laceration on the left posterior parietal scalp. Hemostatic.   Nose:   No congestion/rhinnorhea. no epistaxis or septal hematoma   Mouth/Throat:   MMM, no pharyngeal erythema. No peritonsillar mass. no intraoral injuries   Neck:   No meningismus. Full ROM.no midline spinal tenderness Hematological/Lymphatic/Immunilogical:   No cervical lymphadenopathy. Cardiovascular:   RRR. Symmetric bilateral radial and DP pulses.  No murmurs.  Respiratory:   Normal respiratory effort without tachypnea/retractions. Breath sounds are clear and equal bilaterally. No wheezes/rales/rhonchi. Gastrointestinal:   Soft and nontender. Non distended. There is no CVA tenderness.  No rebound, rigidity, or guarding. Genitourinary:   deferred Musculoskeletal:   Normal range of motion in all extremities. No joint effusions.  No  lower extremity tenderness.  No edema. Neurologic:   Normal speech and language.  Motor grossly intact. No acute focal neurologic deficits are appreciated.  Skin:    Skin is warm, dry and intactother than above-noted scalp laceration. No rash noted.  No petechiae, purpura, or bullae.  ____________________________________________    LABS (pertinent positives/negatives) (all labs ordered are listed, but only abnormal results are displayed) Labs Reviewed  URINE CULTURE  URINALYSIS, COMPLETE (UACMP) WITH MICROSCOPIC   ____________________________________________   EKG  interpreted by me Sinus rhythm rate of 80, normal axis and intervals. Normal QRS and ST segments. Slight scooping of T waves consistent with digoxin use.  ____________________________________________    RADIOLOGY  Ct Head Wo Contrast  Result Date: 09/25/2017 CLINICAL DATA:  Fall. EXAM: CT HEAD WITHOUT CONTRAST TECHNIQUE: Contiguous axial images were obtained from the base of the skull through the vertex without intravenous contrast. COMPARISON:  CT head dated August 12, 2017. FINDINGS: Brain: No evidence of acute infarction, hemorrhage, hydrocephalus, extra-axial collection or mass lesion/mass effect. Stable mild atrophy and chronic microvascular ischemic changes. Unchanged lacunar infarcts in the left caudate head and cerebellum. Vascular: Intracranial atherosclerotic calcification. No hyperdense vessel. Skull: Normal. Negative for fracture or focal lesion. Sinuses/Orbits: No acute finding. Other: Small left posterior parietal scalp laceration and hematoma. IMPRESSION: 1. No acute intracranial abnormality. Small left posterior parietal scalp laceration and hematoma. Electronically Signed   By: Titus Dubin M.D.   On: 09/25/2017 19:44    ____________________________________________   PROCEDURES .Marland KitchenLaceration Repair Date/Time: 09/25/2017 8:24 PM Performed by: Carrie Mew, MD Authorized by: Carrie Mew,  MD   Consent:    Consent obtained:  Verbal   Consent given by:  Patient   Risks discussed:  Infection, pain, retained foreign body, poor cosmetic result and poor wound healing   Alternatives discussed:  No treatment Anesthesia (see MAR for exact dosages):    Anesthesia method:  Local infiltration   Local anesthetic:  Lidocaine 1% w/o epi Laceration details:    Location:  Scalp   Scalp location:  L parietal   Length (cm):  4 Repair type:    Repair type:  Simple Pre-procedure details:    Preparation:  Patient was prepped and draped in usual sterile fashion and imaging obtained to evaluate for foreign bodies Exploration:    Hemostasis achieved with:  Direct pressure   Wound exploration: entire depth of wound probed and visualized     Contaminated: no   Treatment:    Area cleansed with:  Saline and Betadine   Amount of cleaning:  Extensive   Irrigation solution:  Sterile saline   Irrigation method:  Pressure wash   Visualized foreign bodies/material removed: no   Skin repair:    Repair method:  Staples   Number of staples:  5 Approximation:    Approximation:  Close Post-procedure details:    Dressing:  Sterile dressing   Patient tolerance of procedure:  Tolerated well, no immediate complications    ____________________________________________   DIFFERENTIAL DIAGNOSIS subdural hematoma, epidural hematoma, skull fracture  CLINICAL IMPRESSION / ASSESSMENT AND PLAN / ED COURSE  Pertinent labs & imaging results that were available during my care of the patient were reviewed by me and considered in my medical decision making (see chart for details).   patient well. Not in distress, sent to the ED for unwitnessed fall. No specific complaints or concerns reported from her nursing home. CT head obtained to evaluate for internal hemorrhage, negative. Plan to clean and repair her scalp laceration.  We'll check a chest x-ray due to her low normal oxygen saturation. Urinalysis.  Plan to discharge home if there are no severe findings.  Clinical Course as of Sep 25 2022  Thu Sep 25, 2017  1955 CT hed negative for ICH.  CT Head Wo Contrast [PS]  1956 Cxr overall stable but improving compared to prior 09/02/17. No acute findings.  DG Chest 2 View [PS]    Clinical Course User Index [PS] Carrie Mew, MD    ----------------------------------------- 8:26 PM on 09/25/2017 -----------------------------------------  Imaging negative. Laceration repaired. Oxygenation 97% on room air, patient breathing comfortably. Suitable for discharge home and outpatient follow-up.   ____________________________________________   FINAL CLINICAL IMPRESSION(S) / ED DIAGNOSES    Final diagnoses:  Unwitnessed fall  Scalp laceration, initial encounter       Portions of this note were generated with dragon dictation software. Dictation errors Cake occur despite best attempts at proofreading.    Carrie Mew, MD 09/25/17 2026

## 2017-09-25 NOTE — ED Notes (Signed)
LIDOCAINE at bedside

## 2017-09-25 NOTE — ED Notes (Signed)
Called [facility] to give report to RN/caretaker at facility

## 2017-09-27 LAB — URINE CULTURE: CULTURE: NO GROWTH

## 2017-09-29 DIAGNOSIS — F329 Major depressive disorder, single episode, unspecified: Secondary | ICD-10-CM | POA: Diagnosis not present

## 2017-09-29 DIAGNOSIS — J189 Pneumonia, unspecified organism: Secondary | ICD-10-CM | POA: Diagnosis not present

## 2017-09-29 DIAGNOSIS — I10 Essential (primary) hypertension: Secondary | ICD-10-CM | POA: Diagnosis not present

## 2017-09-29 DIAGNOSIS — S0191XA Laceration without foreign body of unspecified part of head, initial encounter: Secondary | ICD-10-CM | POA: Diagnosis not present

## 2017-09-29 DIAGNOSIS — I509 Heart failure, unspecified: Secondary | ICD-10-CM | POA: Diagnosis not present

## 2017-10-02 ENCOUNTER — Telehealth: Payer: Self-pay | Admitting: Cardiovascular Disease

## 2017-10-02 ENCOUNTER — Ambulatory Visit: Payer: Medicare Other | Attending: Family | Admitting: Family

## 2017-10-02 ENCOUNTER — Encounter: Payer: Self-pay | Admitting: Family

## 2017-10-02 VITALS — BP 153/50 | HR 73 | Resp 22 | Ht 66.0 in | Wt 135.5 lb

## 2017-10-02 DIAGNOSIS — I480 Paroxysmal atrial fibrillation: Secondary | ICD-10-CM | POA: Insufficient documentation

## 2017-10-02 DIAGNOSIS — I2510 Atherosclerotic heart disease of native coronary artery without angina pectoris: Secondary | ICD-10-CM | POA: Diagnosis not present

## 2017-10-02 DIAGNOSIS — F0390 Unspecified dementia without behavioral disturbance: Secondary | ICD-10-CM | POA: Insufficient documentation

## 2017-10-02 DIAGNOSIS — E119 Type 2 diabetes mellitus without complications: Secondary | ICD-10-CM | POA: Insufficient documentation

## 2017-10-02 DIAGNOSIS — I110 Hypertensive heart disease with heart failure: Secondary | ICD-10-CM | POA: Diagnosis not present

## 2017-10-02 DIAGNOSIS — Z888 Allergy status to other drugs, medicaments and biological substances status: Secondary | ICD-10-CM | POA: Insufficient documentation

## 2017-10-02 DIAGNOSIS — I5033 Acute on chronic diastolic (congestive) heart failure: Secondary | ICD-10-CM | POA: Insufficient documentation

## 2017-10-02 DIAGNOSIS — Z79899 Other long term (current) drug therapy: Secondary | ICD-10-CM | POA: Diagnosis not present

## 2017-10-02 DIAGNOSIS — J45909 Unspecified asthma, uncomplicated: Secondary | ICD-10-CM | POA: Diagnosis not present

## 2017-10-02 DIAGNOSIS — I252 Old myocardial infarction: Secondary | ICD-10-CM | POA: Insufficient documentation

## 2017-10-02 DIAGNOSIS — Z951 Presence of aortocoronary bypass graft: Secondary | ICD-10-CM | POA: Diagnosis not present

## 2017-10-02 DIAGNOSIS — K219 Gastro-esophageal reflux disease without esophagitis: Secondary | ICD-10-CM | POA: Insufficient documentation

## 2017-10-02 DIAGNOSIS — E039 Hypothyroidism, unspecified: Secondary | ICD-10-CM | POA: Diagnosis not present

## 2017-10-02 DIAGNOSIS — E785 Hyperlipidemia, unspecified: Secondary | ICD-10-CM | POA: Diagnosis not present

## 2017-10-02 DIAGNOSIS — Z880 Allergy status to penicillin: Secondary | ICD-10-CM | POA: Diagnosis not present

## 2017-10-02 DIAGNOSIS — Z7982 Long term (current) use of aspirin: Secondary | ICD-10-CM | POA: Diagnosis not present

## 2017-10-02 DIAGNOSIS — I10 Essential (primary) hypertension: Secondary | ICD-10-CM

## 2017-10-02 DIAGNOSIS — Z8673 Personal history of transient ischemic attack (TIA), and cerebral infarction without residual deficits: Secondary | ICD-10-CM | POA: Diagnosis not present

## 2017-10-02 MED ORDER — FUROSEMIDE 40 MG PO TABS: 40.0000 mg | ORAL_TABLET | Freq: Every day | ORAL | 0 refills | Status: DC

## 2017-10-02 MED ORDER — BENZONATATE 100 MG PO CAPS: 100.0000 mg | ORAL_CAPSULE | Freq: Three times a day (TID) | ORAL | 0 refills | Status: DC | PRN

## 2017-10-02 MED ORDER — POTASSIUM CHLORIDE CRYS ER 20 MEQ PO TBCR: 20.0000 meq | EXTENDED_RELEASE_TABLET | Freq: Every day | ORAL | 0 refills | Status: DC

## 2017-10-02 NOTE — Telephone Encounter (Signed)
Spoke with Neoma Laming with Hospice and she states that they received call from patients son regarding services. She is receiving home health and therapy services at this time. Son does think she [place] need hospice assessment in the future and she wanted [place] to know. Recommended that she contact patients PCP for this and to call back if I can assist with anything. She verbalized understanding with no further questions at this time.

## 2017-10-02 NOTE — Progress Notes (Signed)
Patient ID: Jill Howell, female    DOB: 02-04-1919, 84 y.o.   MRN: [id number]  HPI  Jill Howell is a 84 y/o female with a history of asthma, carotid disease, CAD, diabetes, hyperlipidemia, HTN, thyroid disease, atrial fibrillation, TIA and heart failure.   Echo report from 09/02/17 reviewed and showed an EF of 50-55% along with mild Jill, moderate MR and elevated PA pressure of 60 mm Hg.   Was in the ED 09/25/17 after a fall. She was evaluated and released. Admitted 08/31/17 due to pneumonia and HF exacerbation. Antibiotics were given. Elevated troponin thought to be due to demand ischemia. Discharged after 5 days. Admitted 08/12/17 due to acute rhabdomyolysis status post fall. IV fluids given. Discharged to SNF after 4 days.   She presents today for an acute visit with a chief complaint of minimal shortness of breath upon moderate exertion. Both patient and son say her shortness of breath has worsened over the last two days since she's been home. She has associated fatigue, dry cough, edema and weight gain. Denies any chest pain, dizziness or abdominal distention. Her son says that when she left the SNF, her feet were swollen then and have become more swollen over the last two days.   Past Medical History:  Diagnosis Date  . Allergic rhinitis, cause unspecified   . Anemia   . Asthma   . Atrial fibrillation ([place])    a. remote->no anticoagulation 2/2 h/o falls.  [name] CAD (coronary artery disease)    a. 2000 s/p MI/CABG x 4 (VG-PDA, VG-OM, VG-Diag, LIMA-LAD);  b. 2011 MI;  c. 08/2013 NSTEMI/Cath: LM 90-95, LAD 90/100p, LCX 99p, 163m, RCA 100ost, VG-PDA nl, VG-OM 100 (culprit w/ thrombus), VG-Diag nl, LIMA-LAD nl, EF 55-60%, mild inf HK, mod MR-->Med Rx.  . Carotid arterial disease ([place])    a. 05/2013 s/p L CEA.  . Chronic diastolic CHF (congestive heart failure) ([place])    a. 08/2013 Echo: EF 50-55%, no rwma, Gr2 DD, mod MR, mildly dil LA, PASP 34mmHg.  [name] Dementia   . Esophageal reflux   . History of TIA  (transient ischemic attack)   . Hyperlipidemia   . Hypertension   . Hypothyroidism   . Myocardial infarction ([place])    08-2013  . Type II or unspecified type diabetes mellitus without mention of complication, not stated as uncontrolled   . Unspecified hearing loss    Past Surgical History:  Procedure Laterality Date  . ABDOMINAL HYSTERECTOMY    . Bladder tack     x 2  . CARDIAC CATHETERIZATION  09/07/1999   EF 25%. THERE IS SEVERE MITRAL ANNULAR CALIFICATION, WITH  3 + INSUFFICIENCY  . CARDIOVASCULAR STRESS TEST  06/30/2007   EF 63%, NO ISCHEMIA  . CAROTID ENDARTERECTOMY Left 05-19-13   cea  . CHOLECYSTECTOMY  10/2001  . CORONARY ARTERY BYPASS GRAFT  2000   4 vessel  . DILATION AND CURETTAGE OF UTERUS    . ENDARTERECTOMY Left 05/19/2013   Procedure: ENDARTERECTOMY CAROTID-LEFT;  Surgeon: Elam Dutch, MD;  Location: [facility];  Service: Vascular;  Laterality: Left;  . EYE SURGERY Bilateral    Cataract  . HEMORRHOID SURGERY    . LEFT HEART CATH  Dec. 2, 2014   With  Coronary Graft  Angiogram  . LEFT HEART CATHETERIZATION WITH CORONARY/GRAFT ANGIOGRAM  08/10/2013   Procedure: LEFT HEART CATHETERIZATION WITH Beatrix Fetters;  Surgeon: Peter M Martinique, MD;  Location: [facility] CATH LAB;  Service: Cardiovascular;;  . PARTIAL  HYSTERECTOMY    . RECTOCELE REPAIR     x 2  . ROTATOR CUFF REPAIR    . TONSILLECTOMY    . US ECHOCARDIOGRAPHY  01/17/2005   EF 55-60%   Family History  Problem Relation Age of Onset  . Heart attack Father        X's 3  . Heart disease Father   . Heart disease Son        before age 60  . Cancer Brother   . Hypertension Mother   . Alzheimer's disease Mother    Social History   Tobacco Use  . Smoking status: Never Smoker  . Smokeless tobacco: Never Used  Substance Use Topics  . Alcohol use: No    Alcohol/week: 0.0 oz   Allergies  Allergen Reactions  . Furosemide Other (See Comments)    Patient prefers not to take med, weakness and frequent  urination  . Penicillins Other (See Comments)    unknown   Prior to Admission medications   Medication Sig Start Date End Date Taking? Authorizing Provider  aspirin 81 MG EC tablet Take 81 mg by mouth daily.     Yes [provider]  atenolol (TENORMIN) 25 MG tablet Take 1 tablet (25 mg total) by mouth daily. 06/10/17  Yes Minna Merritts, MD  BIOTIN PO Take 1 capsule by mouth daily.    Yes [provider]  [place] 125 MCG tablet Take 1 tablet by mouth daily. 05/30/17  Yes [provider]  isosorbide mononitrate (IMDUR) 60 MG 24 hr tablet Take 1 tablet (60 mg total) by mouth daily. Patient taking differently: Take 30 mg by mouth daily.  09/06/17  Yes Mody, Ulice Bold, MD  levothyroxine (SYNTHROID, LEVOTHROID) 100 MCG tablet Take 1 tablet (100 mcg total) by mouth daily. 01/08/17  Yes Tonia Ghent, MD  losartan (COZAAR) 25 MG tablet TAKE ONE TABLET EVERY DAY 02/04/17  Yes Gollan, Kathlene November, MD  Multiple Vitamins-Minerals (PRESERVISION AREDS PO) Take 1 tablet by mouth daily.   Yes [provider]  nitroGLYCERIN (NITROSTAT) 0.4 MG SL tablet PLACE 1 TABLET UNDER THE TONGUE EVERY 5 MINUTES AS NEEDED FOR CHEST PAIN. 03/01/15  Yes Darlin Coco, MD  pantoprazole (PROTONIX) 40 MG tablet TAKE ONE TABLET EVERY DAY Patient taking differently: Take one tablet by mouth twice daily 04/09/17  Yes Tonia Ghent, MD  QUEtiapine (SEROQUEL) 25 MG tablet TAKE ONE TABLET BY MOUTH EVERY EVENING AT BEDTIME 01/08/17  Yes Tonia Ghent, MD  simvastatin (ZOCOR) 40 MG tablet TAKE ONE TABLET EVERY DAY 05/30/17  Yes Tonia Ghent, MD  vitamin B-12 (CYANOCOBALAMIN) 1000 MCG tablet Take 1,000 mcg by mouth daily.    Yes [provider]    Review of Systems  Constitutional: Positive for fatigue. Negative for appetite change.  HENT: Positive for hearing loss. Negative for congestion and postnasal drip.   Eyes: Negative.   Respiratory: Positive for cough and shortness of breath.    Cardiovascular: Positive for leg swelling. Negative for chest pain and palpitations.  Gastrointestinal: Negative for abdominal distention and abdominal pain.  Endocrine: Negative.   Genitourinary: Negative.   Musculoskeletal: Negative for back pain and neck pain.  Skin: Negative.   Allergic/Immunologic: Negative.   Neurological: Negative for dizziness and light-headedness.  Hematological: Negative for adenopathy. Does not bruise/bleed easily.  Psychiatric/Behavioral: Negative for dysphoric mood and sleep disturbance (wearing oxygen at bedtime). The patient is not nervous/anxious.    Vitals:   10/02/17 1247  BP: [name])  153/50  Pulse: 73  Resp: (!) 22  SpO2: (!) 89%  Weight: 135 lb 8 oz (61.5 kg)  Height: 5\' 6"  (1.676 m)   Wt Readings from Last 3 Encounters:  10/02/17 135 lb 8 oz (61.5 kg)  09/25/17 131 lb (59.4 kg)  09/16/17 131 lb 6 oz (59.6 kg)   Lab Results  Component Value Date   CREATININE 1.37 (H) 09/04/2017   CREATININE 1.16 (H) 09/03/2017   CREATININE 1.20 (H) 09/02/2017    Physical Exam  Constitutional: She is oriented to person, place, and time. She appears well-developed and well-nourished.  HENT:  Head: Normocephalic and atraumatic.  Right Ear: Decreased hearing is noted.  Left Ear: Decreased hearing is noted.  Neck: Normal range of motion. Neck supple. No JVD present.  Cardiovascular: Normal rate and regular rhythm.  Pulmonary/Chest: Effort normal. She has no wheezes. She has no rales.  Abdominal: Soft. She exhibits no distension. There is no tenderness.  Musculoskeletal: She exhibits edema (2+ pitting edema in bilateral lower legs). She exhibits no tenderness.  Neurological: She is alert and oriented to person, place, and time.  Skin: Skin is warm and dry.  Psychiatric: She has a normal mood and affect. Her behavior is normal. Thought content normal.  Nursing note and vitals reviewed.   Assessment & Plan:  1: Acute on Chronic heart failure with preserved  ejection fraction- - NYHA class III - mildly fluid overloaded today - weighing daily now at home; reminded to call for an overnight weight gain of >2 pounds or a weekly weight gain of >5 pounds - weight up 4 pounds since she was here last week - edema present in lower legs - elevating legs - will add furosemide 40mg  daily along with potassium 87meq daily for the next 5 days; should edema and shortness of breath improve after 2-3 doses, he can hold the other doses; monitor weight careful - will get a BMP next week as she has a history of hyponatremia - saw cardiologist Rockey Situ) 06/10/17 - BNP from 09/01/17 was 2822.0  2: HTN- - BP looks good today - now that she's home, she'll resume seeing her PCP Damita Dunnings) at [place] on 09/04/17 reviewed and showed sodium 134, potassium 3.5 and GFR 31.   3: Paroxymal atrial fibrillation- - currently rate controlled  - taking atenolol & digoxin - dig level on 08/31/17 was 0.7  Medication list was reviewed.  Return in 5 days for a recheck of symptoms and for lab work since given furosemide/potassium today.

## 2017-10-02 NOTE — Patient Instructions (Addendum)
Continue weighing daily and call for an overnight weight gain of > 2 pounds or a weekly weight gain of >5 pounds.  Take lasix and potassium daily for the next 5 days. Will check lab work at your next visit.

## 2017-10-02 NOTE — Telephone Encounter (Signed)
Pt was d/c from hospice on Tues, and son is requesting hospice assessment. Please call and advise.

## 2017-10-03 DIAGNOSIS — E119 Type 2 diabetes mellitus without complications: Secondary | ICD-10-CM | POA: Diagnosis not present

## 2017-10-03 DIAGNOSIS — M6281 Muscle weakness (generalized): Secondary | ICD-10-CM | POA: Diagnosis not present

## 2017-10-03 DIAGNOSIS — I480 Paroxysmal atrial fibrillation: Secondary | ICD-10-CM | POA: Diagnosis not present

## 2017-10-03 DIAGNOSIS — I110 Hypertensive heart disease with heart failure: Secondary | ICD-10-CM | POA: Diagnosis not present

## 2017-10-03 DIAGNOSIS — I5032 Chronic diastolic (congestive) heart failure: Secondary | ICD-10-CM | POA: Diagnosis not present

## 2017-10-03 DIAGNOSIS — S0101XD Laceration without foreign body of scalp, subsequent encounter: Secondary | ICD-10-CM | POA: Diagnosis not present

## 2017-10-03 NOTE — Progress Notes (Signed)
Patient ID: Jill Howell, female    DOB: 22-Dec-1918, 84 y.o.   MRN: [id number]  HPI  Jill Howell is a 84 y/o female with a history of asthma, carotid disease, CAD, diabetes, hyperlipidemia, HTN, thyroid disease, atrial fibrillation, TIA and heart failure.   Echo report from 09/02/17 reviewed and showed an EF of 50-55% along with mild Jill, moderate MR and elevated PA pressure of 60 mm Hg.   Was in the ED 09/25/17 after a fall. She was evaluated and released. Admitted 08/31/17 due to pneumonia and HF exacerbation. Antibiotics were given. Elevated troponin thought to be due to demand ischemia. Discharged after 5 days. Admitted 08/12/17 due to acute rhabdomyolysis status post fall. IV fluids given. Discharged to SNF after 4 days.   She presents today for a follow-up visit with a chief complaint of moderate shortness of breath upon minimal exertion. She does feel like her breathing has improved since she was last here. She continues to have associated fatigue, cough and edema. She denies chest pain, palpitations, abdominal distention, dizziness, weight gain or difficulty sleeping.   Past Medical History:  Diagnosis Date  . Allergic rhinitis, cause unspecified   . Anemia   . Asthma   . Atrial fibrillation ([place])    a. remote->no anticoagulation 2/2 h/o falls.  [name] CAD (coronary artery disease)    a. 2000 s/p MI/CABG x 4 (VG-PDA, VG-OM, VG-Diag, LIMA-LAD);  b. 2011 MI;  c. 08/2013 NSTEMI/Cath: LM 90-95, LAD 90/100p, LCX 99p, 158m, RCA 100ost, VG-PDA nl, VG-OM 100 (culprit w/ thrombus), VG-Diag nl, LIMA-LAD nl, EF 55-60%, mild inf HK, mod MR-->Med Rx.  . Carotid arterial disease ([place])    a. 05/2013 s/p L CEA.  . Chronic diastolic CHF (congestive heart failure) ([place])    a. 08/2013 Echo: EF 50-55%, no rwma, Gr2 DD, mod MR, mildly dil LA, PASP 22mmHg.  [name] Dementia   . Esophageal reflux   . History of TIA (transient ischemic attack)   . Hyperlipidemia   . Hypertension   . Hypothyroidism   . Myocardial  infarction ([place])    08-2013  . Type II or unspecified type diabetes mellitus without mention of complication, not stated as uncontrolled   . Unspecified hearing loss    Past Surgical History:  Procedure Laterality Date  . ABDOMINAL HYSTERECTOMY    . Bladder tack     x 2  . CARDIAC CATHETERIZATION  09/07/1999   EF 25%. THERE IS SEVERE MITRAL ANNULAR CALIFICATION, WITH  3 + INSUFFICIENCY  . CARDIOVASCULAR STRESS TEST  06/30/2007   EF 63%, NO ISCHEMIA  . CAROTID ENDARTERECTOMY Left 05-19-13   cea  . CHOLECYSTECTOMY  10/2001  . CORONARY ARTERY BYPASS GRAFT  2000   4 vessel  . DILATION AND CURETTAGE OF UTERUS    . ENDARTERECTOMY Left 05/19/2013   Procedure: ENDARTERECTOMY CAROTID-LEFT;  Surgeon: Elam Dutch, MD;  Location: [facility];  Service: Vascular;  Laterality: Left;  . EYE SURGERY Bilateral    Cataract  . HEMORRHOID SURGERY    . LEFT HEART CATH  Dec. 2, 2014   With  Coronary Graft  Angiogram  . LEFT HEART CATHETERIZATION WITH CORONARY/GRAFT ANGIOGRAM  08/10/2013   Procedure: LEFT HEART CATHETERIZATION WITH Beatrix Fetters;  Surgeon: Peter M Martinique, MD;  Location: [facility] CATH LAB;  Service: Cardiovascular;;  . PARTIAL HYSTERECTOMY    . RECTOCELE REPAIR     x 2  . ROTATOR CUFF REPAIR    . TONSILLECTOMY    .  US ECHOCARDIOGRAPHY  01/17/2005   EF 55-60%   Family History  Problem Relation Age of Onset  . Heart attack Father        X's 3  . Heart disease Father   . Heart disease Son        before age 67  . Cancer Brother   . Hypertension Mother   . Alzheimer's disease Mother    Social History   Tobacco Use  . Smoking status: Never Smoker  . Smokeless tobacco: Never Used  Substance Use Topics  . Alcohol use: No    Alcohol/week: 0.0 oz   Allergies  Allergen Reactions  . Furosemide Other (See Comments)    Patient prefers not to take med, weakness and frequent urination  . Penicillins Other (See Comments)    unknown   Prior to Admission medications   Medication  Sig Start Date End Date Taking? Authorizing Provider  aspirin 81 MG EC tablet Take 81 mg by mouth daily.     Yes [provider]  atenolol (TENORMIN) 25 MG tablet Take 1 tablet (25 mg total) by mouth daily. 06/10/17  Yes Gollan, Kathlene November, MD  benzonatate (TESSALON PERLES) 100 MG capsule Take 1 capsule (100 mg total) by mouth 3 (three) times daily as needed for cough. 10/02/17  Yes Tanicia Wolaver A, FNP  BIOTIN PO Take 1 capsule by mouth daily.    Yes [provider]  [place] 125 MCG tablet Take 1 tablet by mouth daily. 05/30/17  Yes [provider]  furosemide (LASIX) 40 MG tablet Take 1 tablet (40 mg total) by mouth daily. 10/02/17 12/31/17 Yes Diksha Tagliaferro, Otila Kluver A, FNP  isosorbide mononitrate (IMDUR) 30 MG 24 hr tablet Take 30 mg by mouth daily.   Yes [provider]  levothyroxine (SYNTHROID, LEVOTHROID) 100 MCG tablet Take 1 tablet (100 mcg total) by mouth daily. 01/08/17  Yes Tonia Ghent, MD  losartan (COZAAR) 25 MG tablet TAKE ONE TABLET EVERY DAY 02/04/17  Yes Gollan, Kathlene November, MD  Multiple Vitamins-Minerals (PRESERVISION AREDS PO) Take 1 tablet by mouth daily.   Yes [provider]  nitroGLYCERIN (NITROSTAT) 0.4 MG SL tablet PLACE 1 TABLET UNDER THE TONGUE EVERY 5 MINUTES AS NEEDED FOR CHEST PAIN. 03/01/15  Yes Darlin Coco, MD  pantoprazole (PROTONIX) 40 MG tablet Take 40 mg by mouth 2 (two) times daily.   Yes [provider]  potassium chloride SA (K-DUR,KLOR-CON) 20 MEQ tablet Take 1 tablet (20 mEq total) by mouth daily. 10/02/17 12/31/17 Yes Alisa Graff, FNP  QUEtiapine (SEROQUEL) 25 MG tablet TAKE ONE TABLET BY MOUTH EVERY EVENING AT BEDTIME 01/08/17  Yes Tonia Ghent, MD  simvastatin (ZOCOR) 40 MG tablet TAKE ONE TABLET EVERY DAY 05/30/17  Yes Tonia Ghent, MD  vitamin B-12 (CYANOCOBALAMIN) 1000 MCG tablet Take 1,000 mcg by mouth daily.    Yes [provider]    Review of Systems  Constitutional: Positive for fatigue.  Negative for appetite change.  HENT: Positive for hearing loss. Negative for congestion and postnasal drip.   Eyes: Negative.   Respiratory: Positive for cough and shortness of breath (better). Negative for chest tightness.   Cardiovascular: Positive for leg swelling (better). Negative for chest pain and palpitations.  Gastrointestinal: Negative for abdominal distention and abdominal pain.  Endocrine: Negative.   Genitourinary: Negative.   Musculoskeletal: Negative for back pain and neck pain.  Skin: Negative.   Allergic/Immunologic: Negative.   Neurological: Negative for dizziness and light-headedness.  Hematological:  Negative for adenopathy. Does not bruise/bleed easily.  Psychiatric/Behavioral: Negative for dysphoric mood and sleep disturbance (wearing oxygen at bedtime). The patient is not nervous/anxious.    Vitals:   10/06/17 1252  BP: (!) 135/41  Pulse: 70  Resp: 18  SpO2: 91%  Weight: 130 lb 2 oz (59 kg)  Height: 5\' 7"  (1.702 m)   Wt Readings from Last 3 Encounters:  10/06/17 130 lb 2 oz (59 kg)  10/02/17 135 lb 8 oz (61.5 kg)  09/25/17 131 lb (59.4 kg)   Lab Results  Component Value Date   CREATININE 1.01 (H) 10/06/2017   CREATININE 1.37 (H) 09/04/2017   CREATININE 1.16 (H) 09/03/2017    Physical Exam  Constitutional: She is oriented to person, place, and time. She appears well-developed and well-nourished.  HENT:  Head: Normocephalic. Head is with laceration (has 4 staples in her scalp).  Right Ear: Decreased hearing is noted.  Left Ear: Decreased hearing is noted.  Neck: Normal range of motion. Neck supple. No JVD present.  Cardiovascular: Normal rate and regular rhythm.  Pulmonary/Chest: Effort normal. She has no wheezes. She has no rales.  Abdominal: Soft. She exhibits no distension. There is no tenderness.  Musculoskeletal: She exhibits edema (1+ pitting edema in bilateral lower legs). She exhibits no tenderness.  Neurological: She is alert and oriented to  person, place, and time.  Skin: Skin is warm and dry.  Psychiatric: She has a normal mood and affect. Her behavior is normal. Thought content normal.  Nursing note and vitals reviewed.   Assessment & Plan:  1: Chronic heart failure with preserved ejection fraction- - NYHA class III - mildly fluid overloaded today with edema but improved from last week - weighing daily and weight has declined; reminded to call for an overnight weight gain of >2 pounds or a weekly weight gain of >5 pounds - weight down 5 pounds since she was here last week - edema present in lower legs but improved - elevating legs - she took doses of furosemide/ potassium for 3 days - will get a BMP today as she has a history of hyponatremia - saw cardiologist Rockey Situ) 06/10/17 - BNP from 09/01/17 was 2822.0  2: HTN- - BP looks good today - sees PCP Damita Dunnings) 10/09/17 - BMP on 09/04/17 reviewed and showed sodium 134, potassium 3.5 and GFR 31.   3: Paroxymal atrial fibrillation- - currently rate controlled  - taking atenolol & digoxin - dig level on 08/31/17 was 0.7  Medication list was reviewed.  Return in 6 weeks or sooner for any questions/problems before then.

## 2017-10-06 ENCOUNTER — Ambulatory Visit: Payer: Medicare Other | Attending: Family | Admitting: Family

## 2017-10-06 ENCOUNTER — Encounter: Payer: Self-pay | Admitting: Family

## 2017-10-06 VITALS — BP 135/41 | HR 70 | Resp 18 | Ht 67.0 in | Wt 130.1 lb

## 2017-10-06 DIAGNOSIS — Z888 Allergy status to other drugs, medicaments and biological substances status: Secondary | ICD-10-CM | POA: Diagnosis not present

## 2017-10-06 DIAGNOSIS — Z951 Presence of aortocoronary bypass graft: Secondary | ICD-10-CM | POA: Diagnosis not present

## 2017-10-06 DIAGNOSIS — E039 Hypothyroidism, unspecified: Secondary | ICD-10-CM | POA: Insufficient documentation

## 2017-10-06 DIAGNOSIS — Z79899 Other long term (current) drug therapy: Secondary | ICD-10-CM | POA: Diagnosis not present

## 2017-10-06 DIAGNOSIS — I5032 Chronic diastolic (congestive) heart failure: Secondary | ICD-10-CM | POA: Insufficient documentation

## 2017-10-06 DIAGNOSIS — Z90710 Acquired absence of both cervix and uterus: Secondary | ICD-10-CM | POA: Diagnosis not present

## 2017-10-06 DIAGNOSIS — I2510 Atherosclerotic heart disease of native coronary artery without angina pectoris: Secondary | ICD-10-CM | POA: Diagnosis not present

## 2017-10-06 DIAGNOSIS — Z880 Allergy status to penicillin: Secondary | ICD-10-CM | POA: Insufficient documentation

## 2017-10-06 DIAGNOSIS — Z820 Family history of epilepsy and other diseases of the nervous system: Secondary | ICD-10-CM | POA: Diagnosis not present

## 2017-10-06 DIAGNOSIS — Z7982 Long term (current) use of aspirin: Secondary | ICD-10-CM | POA: Insufficient documentation

## 2017-10-06 DIAGNOSIS — Z8249 Family history of ischemic heart disease and other diseases of the circulatory system: Secondary | ICD-10-CM | POA: Insufficient documentation

## 2017-10-06 DIAGNOSIS — S0101XD Laceration without foreign body of scalp, subsequent encounter: Secondary | ICD-10-CM | POA: Diagnosis not present

## 2017-10-06 DIAGNOSIS — H9190 Unspecified hearing loss, unspecified ear: Secondary | ICD-10-CM | POA: Diagnosis not present

## 2017-10-06 DIAGNOSIS — E119 Type 2 diabetes mellitus without complications: Secondary | ICD-10-CM | POA: Insufficient documentation

## 2017-10-06 DIAGNOSIS — I10 Essential (primary) hypertension: Secondary | ICD-10-CM

## 2017-10-06 DIAGNOSIS — Z79890 Hormone replacement therapy (postmenopausal): Secondary | ICD-10-CM | POA: Diagnosis not present

## 2017-10-06 DIAGNOSIS — I110 Hypertensive heart disease with heart failure: Secondary | ICD-10-CM | POA: Diagnosis not present

## 2017-10-06 DIAGNOSIS — M6281 Muscle weakness (generalized): Secondary | ICD-10-CM | POA: Diagnosis not present

## 2017-10-06 DIAGNOSIS — J45909 Unspecified asthma, uncomplicated: Secondary | ICD-10-CM | POA: Diagnosis not present

## 2017-10-06 DIAGNOSIS — I509 Heart failure, unspecified: Secondary | ICD-10-CM | POA: Diagnosis present

## 2017-10-06 DIAGNOSIS — F0390 Unspecified dementia without behavioral disturbance: Secondary | ICD-10-CM | POA: Diagnosis not present

## 2017-10-06 DIAGNOSIS — I480 Paroxysmal atrial fibrillation: Secondary | ICD-10-CM | POA: Insufficient documentation

## 2017-10-06 DIAGNOSIS — K219 Gastro-esophageal reflux disease without esophagitis: Secondary | ICD-10-CM | POA: Insufficient documentation

## 2017-10-06 DIAGNOSIS — Z8673 Personal history of transient ischemic attack (TIA), and cerebral infarction without residual deficits: Secondary | ICD-10-CM | POA: Diagnosis not present

## 2017-10-06 DIAGNOSIS — I252 Old myocardial infarction: Secondary | ICD-10-CM | POA: Insufficient documentation

## 2017-10-06 DIAGNOSIS — E785 Hyperlipidemia, unspecified: Secondary | ICD-10-CM | POA: Diagnosis not present

## 2017-10-06 LAB — BASIC METABOLIC PANEL
ANION GAP: 10 (ref 5–15)
BUN: 25 mg/dL — ABNORMAL HIGH (ref 6–20)
CHLORIDE: 93 mmol/L — AB (ref 101–111)
CO2: 26 mmol/L (ref 22–32)
Calcium: 8.5 mg/dL — ABNORMAL LOW (ref 8.9–10.3)
Creatinine, Ser: 1.01 mg/dL — ABNORMAL HIGH (ref 0.44–1.00)
GFR, EST AFRICAN AMERICAN: 53 mL/min — AB (ref >60)
GFR, EST NON AFRICAN AMERICAN: 45 mL/min — AB (ref >60)
Glucose, Bld: 119 mg/dL — ABNORMAL HIGH (ref 65–99)
POTASSIUM: 4.3 mmol/L (ref 3.5–5.1)
Sodium: 129 mmol/L — ABNORMAL LOW (ref 135–145)

## 2017-10-06 MED ORDER — FUROSEMIDE 40 MG PO TABS: 40.0000 mg | ORAL_TABLET | ORAL | 5 refills | Status: DC

## 2017-10-06 NOTE — Patient Instructions (Signed)
Continue weighing daily and call for an overnight weight gain of > 2 pounds or a weekly weight gain of >5 pounds. 

## 2017-10-07 DIAGNOSIS — I480 Paroxysmal atrial fibrillation: Secondary | ICD-10-CM | POA: Diagnosis not present

## 2017-10-07 DIAGNOSIS — M6281 Muscle weakness (generalized): Secondary | ICD-10-CM | POA: Diagnosis not present

## 2017-10-07 DIAGNOSIS — I5032 Chronic diastolic (congestive) heart failure: Secondary | ICD-10-CM | POA: Diagnosis not present

## 2017-10-07 DIAGNOSIS — S0101XD Laceration without foreign body of scalp, subsequent encounter: Secondary | ICD-10-CM | POA: Diagnosis not present

## 2017-10-07 DIAGNOSIS — E119 Type 2 diabetes mellitus without complications: Secondary | ICD-10-CM | POA: Diagnosis not present

## 2017-10-07 DIAGNOSIS — I110 Hypertensive heart disease with heart failure: Secondary | ICD-10-CM | POA: Diagnosis not present

## 2017-10-08 ENCOUNTER — Encounter: Payer: Self-pay | Admitting: Family

## 2017-10-08 DIAGNOSIS — S0101XD Laceration without foreign body of scalp, subsequent encounter: Secondary | ICD-10-CM | POA: Diagnosis not present

## 2017-10-08 DIAGNOSIS — I5032 Chronic diastolic (congestive) heart failure: Secondary | ICD-10-CM | POA: Diagnosis not present

## 2017-10-08 DIAGNOSIS — I480 Paroxysmal atrial fibrillation: Secondary | ICD-10-CM | POA: Diagnosis not present

## 2017-10-08 DIAGNOSIS — I110 Hypertensive heart disease with heart failure: Secondary | ICD-10-CM | POA: Diagnosis not present

## 2017-10-08 DIAGNOSIS — E119 Type 2 diabetes mellitus without complications: Secondary | ICD-10-CM | POA: Diagnosis not present

## 2017-10-08 DIAGNOSIS — M6281 Muscle weakness (generalized): Secondary | ICD-10-CM | POA: Diagnosis not present

## 2017-10-09 ENCOUNTER — Encounter: Payer: Self-pay | Admitting: Family Medicine

## 2017-10-09 ENCOUNTER — Ambulatory Visit (INDEPENDENT_AMBULATORY_CARE_PROVIDER_SITE_OTHER): Payer: Medicare Other | Admitting: Family Medicine

## 2017-10-09 VITALS — BP 122/50 | HR 72 | Temp 97.5°F | Wt 130.0 lb

## 2017-10-09 DIAGNOSIS — I5033 Acute on chronic diastolic (congestive) heart failure: Secondary | ICD-10-CM | POA: Diagnosis not present

## 2017-10-09 DIAGNOSIS — L989 Disorder of the skin and subcutaneous tissue, unspecified: Secondary | ICD-10-CM | POA: Diagnosis not present

## 2017-10-09 DIAGNOSIS — Z4802 Encounter for removal of sutures: Secondary | ICD-10-CM

## 2017-10-09 DIAGNOSIS — I5032 Chronic diastolic (congestive) heart failure: Secondary | ICD-10-CM | POA: Diagnosis not present

## 2017-10-09 DIAGNOSIS — Z9181 History of falling: Secondary | ICD-10-CM | POA: Diagnosis not present

## 2017-10-09 LAB — BASIC METABOLIC PANEL
BUN: 23 mg/dL (ref 6–23)
CALCIUM: 8.4 mg/dL (ref 8.4–10.5)
CO2: 29 mEq/L (ref 19–32)
Chloride: 94 mEq/L — ABNORMAL LOW (ref 96–112)
Creatinine, Ser: 1.02 mg/dL (ref 0.40–1.20)
GFR: 53.31 mL/min — ABNORMAL LOW (ref >60.00)
GLUCOSE: 114 mg/dL — AB (ref 70–99)
POTASSIUM: 4.6 meq/L (ref 3.5–5.1)
SODIUM: 129 meq/L — AB (ref 135–145)

## 2017-10-09 MED ORDER — POTASSIUM CHLORIDE CRYS ER 20 MEQ PO TBCR: EXTENDED_RELEASE_TABLET | ORAL | Status: DC

## 2017-10-09 MED ORDER — PANTOPRAZOLE SODIUM 40 MG PO TBEC: 40.0000 mg | DELAYED_RELEASE_TABLET | Freq: Every day | ORAL | Status: AC

## 2017-10-09 NOTE — Patient Instructions (Addendum)
Go to the lab on the way out.  We'll contact you with your lab report. Plan on giving a dose of furosemide and potassium tomorrow.  I'll be in touch with the heart clinic.  If the skin lesion is getting sore, then let me know.  We Jill Howell need to get you set up with the skin clinic in the meantime, but I would sort out your fluid issues first.  Take care.  Glad to see you.

## 2017-10-09 NOTE — Progress Notes (Signed)
ER f/u with head lac s/p repair.  Due for staple removal.    Mult eval/OV/admissions recently re: fall/CHF.  Recently seen by cards with lasix dose adjusted and due for f/u labs.  Had been on lasix 2x/week, most recently given 2 days ago.  Less cough recently.    R upper lip with crusted 1cm lesion, concern for SCC vs BCC.  D/w pt and family.  Not bothersome to patient.  She likely has other more pressing concerns with CHF, etc.    She is living at home with family monitoring her 24/7 due to her ongoing care needs.  Family managing meds, checking daily weight, helping with food, etc.    PMH and SH reviewed  ROS: Per HPI unless specifically indicated in ROS section   Meds, vitals, and allergies reviewed.   GEN: nad, alert but doesn't recall all recent events - this is baseline for patient.  4 staples on the L posterior scalp, removed w/o complication and still with good tissue apposition.   HEENT: mucous membranes moist, R upper lip with crusted lesion noted, 1cm wide.  NECK: supple w/o LA CV: rrr. PULM: ctab, no inc wob ABD: soft, +bs EXT: no edema SKIN: no acute rash

## 2017-10-10 ENCOUNTER — Telehealth: Payer: Self-pay | Admitting: Family Medicine

## 2017-10-10 DIAGNOSIS — I110 Hypertensive heart disease with heart failure: Secondary | ICD-10-CM | POA: Diagnosis not present

## 2017-10-10 DIAGNOSIS — S0101XD Laceration without foreign body of scalp, subsequent encounter: Secondary | ICD-10-CM | POA: Diagnosis not present

## 2017-10-10 DIAGNOSIS — I480 Paroxysmal atrial fibrillation: Secondary | ICD-10-CM | POA: Diagnosis not present

## 2017-10-10 DIAGNOSIS — E119 Type 2 diabetes mellitus without complications: Secondary | ICD-10-CM | POA: Diagnosis not present

## 2017-10-10 DIAGNOSIS — L989 Disorder of the skin and subcutaneous tissue, unspecified: Secondary | ICD-10-CM | POA: Insufficient documentation

## 2017-10-10 DIAGNOSIS — M6281 Muscle weakness (generalized): Secondary | ICD-10-CM | POA: Diagnosis not present

## 2017-10-10 DIAGNOSIS — I5032 Chronic diastolic (congestive) heart failure: Secondary | ICD-10-CM | POA: Diagnosis not present

## 2017-10-10 MED ORDER — POTASSIUM CHLORIDE CRYS ER 20 MEQ PO TBCR: EXTENDED_RELEASE_TABLET | ORAL | 1 refills | Status: DC

## 2017-10-10 NOTE — Assessment & Plan Note (Addendum)
Plan on giving a dose of furosemide and potassium tomorrow.  I'll update the heart clinic. See notes on labs.   >25 minutes spent in face to face time with patient, >50% spent in counselling or coordination of care, discussing labs, daily weights, etc.

## 2017-10-10 NOTE — Assessment & Plan Note (Signed)
Removed w/o complication, still with good tissue apposition, routine instructions given.

## 2017-10-10 NOTE — Assessment & Plan Note (Signed)
If the skin lesion is getting sore, then let me know.  We Jill Howell need to get her set up with derm in the meantime, but I would sort out her fluid/Na status first.  Family agrees.

## 2017-10-10 NOTE — Assessment & Plan Note (Signed)
Family monitoring, cautions given.

## 2017-10-10 NOTE — Telephone Encounter (Signed)
Called son.  Needs Kdur sent.  I sent rx after the call.  Would still continue lasix with Kdur every 3rd day and I'll update and d/w cardiology.  Continue as is for now.  He agrees.  Thanked me for the call.

## 2017-10-10 NOTE — Telephone Encounter (Signed)
Pt was seen 10/09/17.Please advise.

## 2017-10-10 NOTE — Telephone Encounter (Signed)
Copied from [place]. Topic: Quick Communication - Rx Refill/Question >> Oct 10, 2017  3:06 PM Oliver Pila B wrote: Medication: potassium chloride SA (K-DUR,KLOR-CON) 20 MEQ tablet [[id number]]  Has the patient contacted their pharmacy? Yes.    Pt also requesting to hear about lab results as well, contact pt to advise

## 2017-10-12 NOTE — Telephone Encounter (Signed)
See result note.  

## 2017-10-13 ENCOUNTER — Telehealth: Payer: Self-pay | Admitting: Family Medicine

## 2017-10-13 ENCOUNTER — Other Ambulatory Visit: Payer: Self-pay | Admitting: Family Medicine

## 2017-10-13 ENCOUNTER — Ambulatory Visit: Payer: Self-pay | Admitting: Family

## 2017-10-13 DIAGNOSIS — L989 Disorder of the skin and subcutaneous tissue, unspecified: Secondary | ICD-10-CM

## 2017-10-13 MED ORDER — FUROSEMIDE 40 MG PO TABS: ORAL_TABLET | ORAL | Status: AC

## 2017-10-13 MED ORDER — POTASSIUM CHLORIDE CRYS ER 20 MEQ PO TBCR: EXTENDED_RELEASE_TABLET | ORAL | Status: DC

## 2017-10-13 NOTE — Telephone Encounter (Signed)
I spoke with Jill Howell; Dr Damita Dunnings removed staples  in head on 10/09/17. There is still one staple left to be taken out. Dr Damita Dunnings said for pt to come today at 4:30. Front desk will let [place] CMA know when pt arrives.

## 2017-10-13 NOTE — Telephone Encounter (Signed)
Thank you :)

## 2017-10-13 NOTE — Progress Notes (Signed)
D/w pt about her situation and extra staple removed.  I apologized.  I thought she had 4 staples present, 4 removed prev.  I didn't seen the 5th staple.  Son and patient accepted my apology.  Scalp lac healing well.  No complications.   She had inc in weight on 2nd day w/o lasix dose so son gave lasix/K on 2nd day, not 3rd day.  Weight back to baseline dry weight.  Not SOB.  BLE edema controlled.    rrr ctab today at exam.   I think this is reasonable to aim for every 2nd to 3rd day dosing, depending on weight. Her son is going to update me in about 1 week.  She has cards f/u pending.   She has an upper lip lesion concerning for skin cancer.  D/w pt.  Reasonable to have patient see derm with the understanding that intervention Fiorito end up being (appropriately) deferred if she would have a need for extensive resection with prolonged healing that [place] be more bothersome to the patient- this is the son's main concern.  Referred.    I thank all involved.

## 2017-10-13 NOTE — Telephone Encounter (Signed)
Copied from [place]. Topic: Quick Communication - See Telephone Encounter >> Oct 13, 2017  2:04 PM Burnis Medin, NT wrote: CRM for notification. See Telephone encounter for: Patient son would like a call back regarding his mother sutures being removed.  10/13/17.

## 2017-10-14 ENCOUNTER — Telehealth: Payer: Self-pay | Admitting: Family Medicine

## 2017-10-14 ENCOUNTER — Telehealth: Payer: Self-pay

## 2017-10-14 DIAGNOSIS — I480 Paroxysmal atrial fibrillation: Secondary | ICD-10-CM | POA: Diagnosis not present

## 2017-10-14 DIAGNOSIS — M6281 Muscle weakness (generalized): Secondary | ICD-10-CM | POA: Diagnosis not present

## 2017-10-14 DIAGNOSIS — E119 Type 2 diabetes mellitus without complications: Secondary | ICD-10-CM | POA: Diagnosis not present

## 2017-10-14 DIAGNOSIS — I110 Hypertensive heart disease with heart failure: Secondary | ICD-10-CM | POA: Diagnosis not present

## 2017-10-14 DIAGNOSIS — I5032 Chronic diastolic (congestive) heart failure: Secondary | ICD-10-CM | POA: Diagnosis not present

## 2017-10-14 DIAGNOSIS — S0101XD Laceration without foreign body of scalp, subsequent encounter: Secondary | ICD-10-CM | POA: Diagnosis not present

## 2017-10-14 NOTE — Telephone Encounter (Signed)
Please cancel it.  Thanks.  I appreciate their input.   If I need to cancel the order, then let me know.

## 2017-10-14 NOTE — Telephone Encounter (Signed)
Spoke with patient's son [name] Rester, in regards to dermatology referral for the patient and he said after discussing this with other family members they decided to not go through with this. They do not think this will be beneficial for the patient or that it will improve her quality of life or change it. Would you like this referral order to be cancelled at this time? Thank you-.Kris Mouton, RMA

## 2017-10-14 NOTE — Telephone Encounter (Signed)
Copied from [place]. Topic: General - Other >> Oct 14, 2017  2:57 PM Carolyn Stare wrote:  Darnelle Bos with [facility] call to req 3 in 1 commode and  parameters for oxgen levels  [phone]

## 2017-10-14 NOTE — Telephone Encounter (Signed)
error 

## 2017-10-15 ENCOUNTER — Encounter: Payer: Self-pay | Admitting: *Deleted

## 2017-10-15 DIAGNOSIS — S0101XD Laceration without foreign body of scalp, subsequent encounter: Secondary | ICD-10-CM | POA: Diagnosis not present

## 2017-10-15 DIAGNOSIS — E119 Type 2 diabetes mellitus without complications: Secondary | ICD-10-CM | POA: Diagnosis not present

## 2017-10-15 DIAGNOSIS — I5032 Chronic diastolic (congestive) heart failure: Secondary | ICD-10-CM | POA: Diagnosis not present

## 2017-10-15 DIAGNOSIS — I110 Hypertensive heart disease with heart failure: Secondary | ICD-10-CM | POA: Diagnosis not present

## 2017-10-15 DIAGNOSIS — M6281 Muscle weakness (generalized): Secondary | ICD-10-CM | POA: Diagnosis not present

## 2017-10-15 DIAGNOSIS — I480 Paroxysmal atrial fibrillation: Secondary | ICD-10-CM | POA: Diagnosis not present

## 2017-10-15 NOTE — Telephone Encounter (Signed)
Daphne with [facility] notified as instructed by telephone and verbalized understanding. Daphne stated that patient does not have oxygen in her home, but feels that it would be beneficial to her. Daphne stated that she needs an order with signature from Dr. Damita Dunnings faxed over for the commode. Needs to be faxed to [phone]

## 2017-10-15 NOTE — Telephone Encounter (Signed)
Left message on voicemail for Daphne to call back.  Order for commode is on Dr. Josefine Class desk for signature.

## 2017-10-15 NOTE — Telephone Encounter (Signed)
Okay for 3 in 1 commode.   Record daily weight in the AM.  If >2 lbs change per day or 5 lbs in a week (meaning above 135 lbs), then take a dose of lasix and potassium and update MD.   Check pulse ox if SOB.   If O2 sats <90 on RA then start O2 at 2L via [place] and update MD.  Thanks.   I routed this to cards to keep everyone in the loop.

## 2017-10-15 NOTE — Telephone Encounter (Signed)
Please print the order for the commode.  I'll sign it.  Needs 3 in 1 commode due to fall risk.  Would help with toileting.  Dx: Z91.81  Does she actually qualify for home O2?  Do we have documented lower sats at home/clinic/etc that would allow her to qualify?  And is she willing to use it?  Let me know.  Thanks.

## 2017-10-16 ENCOUNTER — Telehealth: Payer: Self-pay | Admitting: Family Medicine

## 2017-10-16 NOTE — Telephone Encounter (Signed)
Copied from [place]. Topic: Quick Communication - See Telephone Encounter >> Oct 16, 2017  2:58 PM Clack, Laban Emperor wrote: CRM for notification. See Telephone encounter for:  Jill Howell with [facility] called to request an order for hospice care for the pt.  Contact [phone] 10/16/17.

## 2017-10-16 NOTE — Telephone Encounter (Signed)
Faxed order for 3 in 1 commode.

## 2017-10-16 NOTE — Telephone Encounter (Signed)
Last seen 10/09/17.

## 2017-10-17 DIAGNOSIS — I5033 Acute on chronic diastolic (congestive) heart failure: Secondary | ICD-10-CM | POA: Diagnosis not present

## 2017-10-17 DIAGNOSIS — E785 Hyperlipidemia, unspecified: Secondary | ICD-10-CM | POA: Diagnosis not present

## 2017-10-17 DIAGNOSIS — I480 Paroxysmal atrial fibrillation: Secondary | ICD-10-CM | POA: Diagnosis not present

## 2017-10-17 DIAGNOSIS — D649 Anemia, unspecified: Secondary | ICD-10-CM | POA: Diagnosis not present

## 2017-10-17 DIAGNOSIS — Z951 Presence of aortocoronary bypass graft: Secondary | ICD-10-CM | POA: Diagnosis not present

## 2017-10-17 DIAGNOSIS — I110 Hypertensive heart disease with heart failure: Secondary | ICD-10-CM | POA: Diagnosis not present

## 2017-10-17 DIAGNOSIS — K219 Gastro-esophageal reflux disease without esophagitis: Secondary | ICD-10-CM | POA: Diagnosis not present

## 2017-10-17 DIAGNOSIS — L89152 Pressure ulcer of sacral region, stage 2: Secondary | ICD-10-CM | POA: Diagnosis not present

## 2017-10-17 DIAGNOSIS — I25118 Atherosclerotic heart disease of native coronary artery with other forms of angina pectoris: Secondary | ICD-10-CM | POA: Diagnosis not present

## 2017-10-17 DIAGNOSIS — E039 Hypothyroidism, unspecified: Secondary | ICD-10-CM | POA: Diagnosis not present

## 2017-10-17 DIAGNOSIS — Z8673 Personal history of transient ischemic attack (TIA), and cerebral infarction without residual deficits: Secondary | ICD-10-CM | POA: Diagnosis not present

## 2017-10-17 NOTE — Telephone Encounter (Signed)
I'm okay with this, assuming the family and patient agree.  I can be the attending but I would like hospice help. Thanks.

## 2017-10-17 NOTE — Telephone Encounter (Signed)
Tina with Hospice advised.  Last OV, last labs, Demographics, meds list faxed to [phone].  Otila Kluver states that the patient's son [name]) called in requesting the assessment.

## 2017-10-18 ENCOUNTER — Encounter: Payer: Self-pay | Admitting: Family Medicine

## 2017-10-18 DIAGNOSIS — Z515 Encounter for palliative care: Secondary | ICD-10-CM | POA: Insufficient documentation

## 2017-10-20 DIAGNOSIS — I25118 Atherosclerotic heart disease of native coronary artery with other forms of angina pectoris: Secondary | ICD-10-CM | POA: Diagnosis not present

## 2017-10-20 DIAGNOSIS — L89152 Pressure ulcer of sacral region, stage 2: Secondary | ICD-10-CM | POA: Diagnosis not present

## 2017-10-20 DIAGNOSIS — I480 Paroxysmal atrial fibrillation: Secondary | ICD-10-CM | POA: Diagnosis not present

## 2017-10-20 DIAGNOSIS — I110 Hypertensive heart disease with heart failure: Secondary | ICD-10-CM | POA: Diagnosis not present

## 2017-10-20 DIAGNOSIS — I5033 Acute on chronic diastolic (congestive) heart failure: Secondary | ICD-10-CM | POA: Diagnosis not present

## 2017-10-20 DIAGNOSIS — Z8673 Personal history of transient ischemic attack (TIA), and cerebral infarction without residual deficits: Secondary | ICD-10-CM | POA: Diagnosis not present

## 2017-10-23 DIAGNOSIS — I480 Paroxysmal atrial fibrillation: Secondary | ICD-10-CM | POA: Diagnosis not present

## 2017-10-23 DIAGNOSIS — I5033 Acute on chronic diastolic (congestive) heart failure: Secondary | ICD-10-CM | POA: Diagnosis not present

## 2017-10-23 DIAGNOSIS — L89152 Pressure ulcer of sacral region, stage 2: Secondary | ICD-10-CM | POA: Diagnosis not present

## 2017-10-23 DIAGNOSIS — I25118 Atherosclerotic heart disease of native coronary artery with other forms of angina pectoris: Secondary | ICD-10-CM | POA: Diagnosis not present

## 2017-10-23 DIAGNOSIS — Z8673 Personal history of transient ischemic attack (TIA), and cerebral infarction without residual deficits: Secondary | ICD-10-CM | POA: Diagnosis not present

## 2017-10-23 DIAGNOSIS — I110 Hypertensive heart disease with heart failure: Secondary | ICD-10-CM | POA: Diagnosis not present

## 2017-10-24 DIAGNOSIS — I25118 Atherosclerotic heart disease of native coronary artery with other forms of angina pectoris: Secondary | ICD-10-CM | POA: Diagnosis not present

## 2017-10-24 DIAGNOSIS — Z8673 Personal history of transient ischemic attack (TIA), and cerebral infarction without residual deficits: Secondary | ICD-10-CM | POA: Diagnosis not present

## 2017-10-24 DIAGNOSIS — I5033 Acute on chronic diastolic (congestive) heart failure: Secondary | ICD-10-CM | POA: Diagnosis not present

## 2017-10-24 DIAGNOSIS — L89152 Pressure ulcer of sacral region, stage 2: Secondary | ICD-10-CM | POA: Diagnosis not present

## 2017-10-24 DIAGNOSIS — I110 Hypertensive heart disease with heart failure: Secondary | ICD-10-CM | POA: Diagnosis not present

## 2017-10-24 DIAGNOSIS — I480 Paroxysmal atrial fibrillation: Secondary | ICD-10-CM | POA: Diagnosis not present

## 2017-10-27 DIAGNOSIS — Z8673 Personal history of transient ischemic attack (TIA), and cerebral infarction without residual deficits: Secondary | ICD-10-CM | POA: Diagnosis not present

## 2017-10-27 DIAGNOSIS — I25118 Atherosclerotic heart disease of native coronary artery with other forms of angina pectoris: Secondary | ICD-10-CM | POA: Diagnosis not present

## 2017-10-27 DIAGNOSIS — I5033 Acute on chronic diastolic (congestive) heart failure: Secondary | ICD-10-CM | POA: Diagnosis not present

## 2017-10-27 DIAGNOSIS — I110 Hypertensive heart disease with heart failure: Secondary | ICD-10-CM | POA: Diagnosis not present

## 2017-10-27 DIAGNOSIS — I480 Paroxysmal atrial fibrillation: Secondary | ICD-10-CM | POA: Diagnosis not present

## 2017-10-27 DIAGNOSIS — L89152 Pressure ulcer of sacral region, stage 2: Secondary | ICD-10-CM | POA: Diagnosis not present

## 2017-10-28 DIAGNOSIS — I10 Essential (primary) hypertension: Secondary | ICD-10-CM | POA: Diagnosis not present

## 2017-10-28 DIAGNOSIS — M6281 Muscle weakness (generalized): Secondary | ICD-10-CM | POA: Diagnosis not present

## 2017-10-28 DIAGNOSIS — I5032 Chronic diastolic (congestive) heart failure: Secondary | ICD-10-CM | POA: Diagnosis not present

## 2017-10-28 DIAGNOSIS — S0101XD Laceration without foreign body of scalp, subsequent encounter: Secondary | ICD-10-CM | POA: Diagnosis not present

## 2017-10-30 DIAGNOSIS — Z8673 Personal history of transient ischemic attack (TIA), and cerebral infarction without residual deficits: Secondary | ICD-10-CM | POA: Diagnosis not present

## 2017-10-30 DIAGNOSIS — L89152 Pressure ulcer of sacral region, stage 2: Secondary | ICD-10-CM | POA: Diagnosis not present

## 2017-10-30 DIAGNOSIS — I5033 Acute on chronic diastolic (congestive) heart failure: Secondary | ICD-10-CM | POA: Diagnosis not present

## 2017-10-30 DIAGNOSIS — Z0279 Encounter for issue of other medical certificate: Secondary | ICD-10-CM | POA: Diagnosis not present

## 2017-10-30 DIAGNOSIS — I110 Hypertensive heart disease with heart failure: Secondary | ICD-10-CM | POA: Diagnosis not present

## 2017-10-30 DIAGNOSIS — I480 Paroxysmal atrial fibrillation: Secondary | ICD-10-CM | POA: Diagnosis not present

## 2017-10-30 DIAGNOSIS — I25118 Atherosclerotic heart disease of native coronary artery with other forms of angina pectoris: Secondary | ICD-10-CM | POA: Diagnosis not present

## 2017-11-03 ENCOUNTER — Other Ambulatory Visit: Payer: Self-pay | Admitting: Family Medicine

## 2017-11-03 DIAGNOSIS — I110 Hypertensive heart disease with heart failure: Secondary | ICD-10-CM | POA: Diagnosis not present

## 2017-11-03 DIAGNOSIS — I5033 Acute on chronic diastolic (congestive) heart failure: Secondary | ICD-10-CM | POA: Diagnosis not present

## 2017-11-03 DIAGNOSIS — I25118 Atherosclerotic heart disease of native coronary artery with other forms of angina pectoris: Secondary | ICD-10-CM | POA: Diagnosis not present

## 2017-11-03 DIAGNOSIS — Z8673 Personal history of transient ischemic attack (TIA), and cerebral infarction without residual deficits: Secondary | ICD-10-CM | POA: Diagnosis not present

## 2017-11-03 DIAGNOSIS — L89152 Pressure ulcer of sacral region, stage 2: Secondary | ICD-10-CM | POA: Diagnosis not present

## 2017-11-03 DIAGNOSIS — I480 Paroxysmal atrial fibrillation: Secondary | ICD-10-CM | POA: Diagnosis not present

## 2017-11-06 DIAGNOSIS — I25118 Atherosclerotic heart disease of native coronary artery with other forms of angina pectoris: Secondary | ICD-10-CM | POA: Diagnosis not present

## 2017-11-06 DIAGNOSIS — I110 Hypertensive heart disease with heart failure: Secondary | ICD-10-CM | POA: Diagnosis not present

## 2017-11-06 DIAGNOSIS — Z8673 Personal history of transient ischemic attack (TIA), and cerebral infarction without residual deficits: Secondary | ICD-10-CM | POA: Diagnosis not present

## 2017-11-06 DIAGNOSIS — L89152 Pressure ulcer of sacral region, stage 2: Secondary | ICD-10-CM | POA: Diagnosis not present

## 2017-11-06 DIAGNOSIS — I480 Paroxysmal atrial fibrillation: Secondary | ICD-10-CM | POA: Diagnosis not present

## 2017-11-06 DIAGNOSIS — I5033 Acute on chronic diastolic (congestive) heart failure: Secondary | ICD-10-CM | POA: Diagnosis not present

## 2017-11-07 DIAGNOSIS — D649 Anemia, unspecified: Secondary | ICD-10-CM | POA: Diagnosis not present

## 2017-11-07 DIAGNOSIS — E785 Hyperlipidemia, unspecified: Secondary | ICD-10-CM | POA: Diagnosis not present

## 2017-11-07 DIAGNOSIS — I5033 Acute on chronic diastolic (congestive) heart failure: Secondary | ICD-10-CM | POA: Diagnosis not present

## 2017-11-07 DIAGNOSIS — L89152 Pressure ulcer of sacral region, stage 2: Secondary | ICD-10-CM | POA: Diagnosis not present

## 2017-11-07 DIAGNOSIS — I110 Hypertensive heart disease with heart failure: Secondary | ICD-10-CM | POA: Diagnosis not present

## 2017-11-07 DIAGNOSIS — K219 Gastro-esophageal reflux disease without esophagitis: Secondary | ICD-10-CM | POA: Diagnosis not present

## 2017-11-07 DIAGNOSIS — Z8673 Personal history of transient ischemic attack (TIA), and cerebral infarction without residual deficits: Secondary | ICD-10-CM | POA: Diagnosis not present

## 2017-11-07 DIAGNOSIS — I25118 Atherosclerotic heart disease of native coronary artery with other forms of angina pectoris: Secondary | ICD-10-CM | POA: Diagnosis not present

## 2017-11-07 DIAGNOSIS — E039 Hypothyroidism, unspecified: Secondary | ICD-10-CM | POA: Diagnosis not present

## 2017-11-07 DIAGNOSIS — Z951 Presence of aortocoronary bypass graft: Secondary | ICD-10-CM | POA: Diagnosis not present

## 2017-11-07 DIAGNOSIS — I480 Paroxysmal atrial fibrillation: Secondary | ICD-10-CM | POA: Diagnosis not present

## 2017-11-10 DIAGNOSIS — I110 Hypertensive heart disease with heart failure: Secondary | ICD-10-CM | POA: Diagnosis not present

## 2017-11-10 DIAGNOSIS — I25118 Atherosclerotic heart disease of native coronary artery with other forms of angina pectoris: Secondary | ICD-10-CM | POA: Diagnosis not present

## 2017-11-10 DIAGNOSIS — L89152 Pressure ulcer of sacral region, stage 2: Secondary | ICD-10-CM | POA: Diagnosis not present

## 2017-11-10 DIAGNOSIS — I5033 Acute on chronic diastolic (congestive) heart failure: Secondary | ICD-10-CM | POA: Diagnosis not present

## 2017-11-10 DIAGNOSIS — I480 Paroxysmal atrial fibrillation: Secondary | ICD-10-CM | POA: Diagnosis not present

## 2017-11-10 DIAGNOSIS — Z8673 Personal history of transient ischemic attack (TIA), and cerebral infarction without residual deficits: Secondary | ICD-10-CM | POA: Diagnosis not present

## 2017-11-11 DIAGNOSIS — L89152 Pressure ulcer of sacral region, stage 2: Secondary | ICD-10-CM | POA: Diagnosis not present

## 2017-11-11 DIAGNOSIS — Z8673 Personal history of transient ischemic attack (TIA), and cerebral infarction without residual deficits: Secondary | ICD-10-CM | POA: Diagnosis not present

## 2017-11-11 DIAGNOSIS — I110 Hypertensive heart disease with heart failure: Secondary | ICD-10-CM | POA: Diagnosis not present

## 2017-11-11 DIAGNOSIS — I25118 Atherosclerotic heart disease of native coronary artery with other forms of angina pectoris: Secondary | ICD-10-CM | POA: Diagnosis not present

## 2017-11-11 DIAGNOSIS — I480 Paroxysmal atrial fibrillation: Secondary | ICD-10-CM | POA: Diagnosis not present

## 2017-11-11 DIAGNOSIS — I5033 Acute on chronic diastolic (congestive) heart failure: Secondary | ICD-10-CM | POA: Diagnosis not present

## 2017-11-13 DIAGNOSIS — I25118 Atherosclerotic heart disease of native coronary artery with other forms of angina pectoris: Secondary | ICD-10-CM | POA: Diagnosis not present

## 2017-11-13 DIAGNOSIS — L89152 Pressure ulcer of sacral region, stage 2: Secondary | ICD-10-CM | POA: Diagnosis not present

## 2017-11-13 DIAGNOSIS — I110 Hypertensive heart disease with heart failure: Secondary | ICD-10-CM | POA: Diagnosis not present

## 2017-11-13 DIAGNOSIS — Z8673 Personal history of transient ischemic attack (TIA), and cerebral infarction without residual deficits: Secondary | ICD-10-CM | POA: Diagnosis not present

## 2017-11-13 DIAGNOSIS — I480 Paroxysmal atrial fibrillation: Secondary | ICD-10-CM | POA: Diagnosis not present

## 2017-11-13 DIAGNOSIS — I5033 Acute on chronic diastolic (congestive) heart failure: Secondary | ICD-10-CM | POA: Diagnosis not present

## 2017-11-16 NOTE — Progress Notes (Signed)
Patient ID: Jill Howell, female    DOB: 1918-10-06, 84 y.o.   MRN: [id number]  HPI  Jill Howell is a 84 y/o female with a history of asthma, carotid disease, CAD, diabetes, hyperlipidemia, HTN, thyroid disease, atrial fibrillation, TIA and heart failure.   Echo report from 09/02/17 reviewed and showed an EF of 50-55% along with mild Jill, moderate MR and elevated PA pressure of 60 mm Hg.   Was in the ED 09/25/17 after a fall. She was evaluated and released. Admitted 08/31/17 due to pneumonia and HF exacerbation. Antibiotics were given. Elevated troponin thought to be due to demand ischemia. Discharged after 5 days. Admitted 08/12/17 due to acute rhabdomyolysis status post fall. IV fluids given. Discharged to SNF after 4 days.   She presents today for a follow-up visit with a chief complaint of moderate fatigue upon minimal exertion. This has been present for several years with varying levels of severity. She has associated hearing loss and edema along with this. She denies any difficulty sleeping, abdominal distention, palpitations, chest pain, light-headedness, shortness of breath, cough or weight gain. Has been taking furosemide/potassium about twice a week depending on weight and/or edema.   Past Medical History:  Diagnosis Date  . Allergic rhinitis, cause unspecified   . Anemia   . Asthma   . Atrial fibrillation ([place])    a. remote->no anticoagulation 2/2 h/o falls.  [name] CAD (coronary artery disease)    a. 2000 s/p MI/CABG x 4 (VG-PDA, VG-OM, VG-Diag, LIMA-LAD);  b. 2011 MI;  c. 08/2013 NSTEMI/Cath: LM 90-95, LAD 90/100p, LCX 99p, 116m, RCA 100ost, VG-PDA nl, VG-OM 100 (culprit w/ thrombus), VG-Diag nl, LIMA-LAD nl, EF 55-60%, mild inf HK, mod MR-->Med Rx.  . Carotid arterial disease ([place])    a. 05/2013 s/p L CEA.  . Chronic diastolic CHF (congestive heart failure) ([place])    a. 08/2013 Echo: EF 50-55%, no rwma, Gr2 DD, mod MR, mildly dil LA, PASP 15mmHg.  [name] Dementia   . Esophageal reflux   .  History of TIA (transient ischemic attack)   . Hyperlipidemia   . Hypertension   . Hypothyroidism   . Myocardial infarction ([place])    08-2013  . Type II or unspecified type diabetes mellitus without mention of complication, not stated as uncontrolled   . Unspecified hearing loss    Past Surgical History:  Procedure Laterality Date  . ABDOMINAL HYSTERECTOMY    . Bladder tack     x 2  . CARDIAC CATHETERIZATION  09/07/1999   EF 25%. THERE IS SEVERE MITRAL ANNULAR CALIFICATION, WITH  3 + INSUFFICIENCY  . CARDIOVASCULAR STRESS TEST  06/30/2007   EF 63%, NO ISCHEMIA  . CAROTID ENDARTERECTOMY Left 05-19-13   cea  . CHOLECYSTECTOMY  10/2001  . CORONARY ARTERY BYPASS GRAFT  2000   4 vessel  . DILATION AND CURETTAGE OF UTERUS    . ENDARTERECTOMY Left 05/19/2013   Procedure: ENDARTERECTOMY CAROTID-LEFT;  Surgeon: Elam Dutch, MD;  Location: [facility];  Service: Vascular;  Laterality: Left;  . EYE SURGERY Bilateral    Cataract  . HEMORRHOID SURGERY    . LEFT HEART CATH  Dec. 2, 2014   With  Coronary Graft  Angiogram  . LEFT HEART CATHETERIZATION WITH CORONARY/GRAFT ANGIOGRAM  08/10/2013   Procedure: LEFT HEART CATHETERIZATION WITH Beatrix Fetters;  Surgeon: Peter M Martinique, MD;  Location: [facility] CATH LAB;  Service: Cardiovascular;;  . PARTIAL HYSTERECTOMY    . RECTOCELE REPAIR  x 2  . ROTATOR CUFF REPAIR    . TONSILLECTOMY    . US ECHOCARDIOGRAPHY  01/17/2005   EF 55-60%   Family History  Problem Relation Age of Onset  . Heart attack Father        X's 3  . Heart disease Father   . Heart disease Son        before age 9  . Cancer Brother   . Hypertension Mother   . Alzheimer's disease Mother    Social History   Tobacco Use  . Smoking status: Never Smoker  . Smokeless tobacco: Never Used  Substance Use Topics  . Alcohol use: No    Alcohol/week: 0.0 oz   Allergies  Allergen Reactions  . Penicillins Other (See Comments)    unknown   Prior to Admission medications    Medication Sig Start Date End Date Taking? Authorizing Provider  aspirin 81 MG EC tablet Take 81 mg by mouth daily.     Yes [provider]  atenolol (TENORMIN) 25 MG tablet Take 1 tablet (25 mg total) by mouth daily. 06/10/17  Yes Minna Merritts, MD  BIOTIN PO Take 1 capsule by mouth daily.    Yes [provider]  [place] 125 MCG tablet Take 1 tablet by mouth daily. 05/30/17  Yes [provider]  furosemide (LASIX) 40 MG tablet Take 1 tab every 2-3 days when weight increases.  Take with potassium. 10/13/17  Yes Tonia Ghent, MD  isosorbide mononitrate (IMDUR) 30 MG 24 hr tablet Take 30 mg by mouth daily.   Yes [provider]  levothyroxine (SYNTHROID, LEVOTHROID) 100 MCG tablet Take 1 tablet (100 mcg total) by mouth daily. 01/08/17  Yes Tonia Ghent, MD  losartan (COZAAR) 25 MG tablet TAKE ONE TABLET EVERY DAY 02/04/17  Yes Gollan, Kathlene November, MD  Multiple Vitamins-Minerals (PRESERVISION AREDS PO) Take 1 tablet by mouth daily.   Yes [provider]  nitroGLYCERIN (NITROSTAT) 0.4 MG SL tablet PLACE 1 TABLET UNDER THE TONGUE EVERY 5 MINUTES AS NEEDED FOR CHEST PAIN. 03/01/15  Yes Darlin Coco, MD  pantoprazole (PROTONIX) 40 MG tablet Take 1 tablet (40 mg total) by mouth daily. 10/09/17  Yes Tonia Ghent, MD  potassium chloride SA (K-DUR,KLOR-CON) 20 MEQ tablet Take 1 tab every 2-3 days when weight increases.  Take with furosemide. 10/13/17  Yes Tonia Ghent, MD  QUEtiapine (SEROQUEL) 25 MG tablet TAKE ONE TABLET BY MOUTH EVERY EVENING AT BEDTIME 01/08/17  Yes Tonia Ghent, MD  simvastatin (ZOCOR) 40 MG tablet TAKE ONE TABLET EVERY DAY 05/30/17  Yes Tonia Ghent, MD  simvastatin (ZOCOR) 40 MG tablet TAKE ONE TABLET EVERY DAY 11/03/17  Yes Tonia Ghent, MD  vitamin B-12 (CYANOCOBALAMIN) 1000 MCG tablet Take 1,000 mcg by mouth daily.    Yes [provider]    Review of Systems  Constitutional: Positive for fatigue. Negative  for appetite change.  HENT: Positive for hearing loss. Negative for congestion and postnasal drip.   Eyes: Negative.   Respiratory: Negative for cough, chest tightness and shortness of breath.   Cardiovascular: Positive for leg swelling (better). Negative for chest pain and palpitations.  Gastrointestinal: Negative for abdominal distention and abdominal pain.  Endocrine: Negative.   Genitourinary: Negative.   Musculoskeletal: Negative for back pain and neck pain.  Skin: Negative.   Allergic/Immunologic: Negative.   Neurological: Negative for dizziness and light-headedness.  Hematological: Negative for adenopathy. Does not bruise/bleed easily.  Psychiatric/Behavioral:  Negative for dysphoric mood and sleep disturbance (not wearing oxygen). The patient is not nervous/anxious.    Vitals:   11/17/17 1157 11/17/17 1219  BP: (!) 148/101 130/60  Pulse: 72   Resp: 18   SpO2: 98%   Weight: 126 lb 4 oz (57.3 kg)   Height: 5\' 5"  (1.651 m)    Wt Readings from Last 3 Encounters:  11/17/17 126 lb 4 oz (57.3 kg)  10/09/17 130 lb (59 kg)  10/06/17 130 lb 2 oz (59 kg)   Lab Results  Component Value Date   CREATININE 1.02 10/09/2017   CREATININE 1.01 (H) 10/06/2017   CREATININE 1.37 (H) 09/04/2017   Physical Exam  Constitutional: She is oriented to person, place, and time. She appears well-developed and well-nourished.  HENT:  Head: Normocephalic.  Right Ear: Decreased hearing is noted.  Left Ear: Decreased hearing is noted.  Neck: Normal range of motion. Neck supple. No JVD present.  Cardiovascular: Normal rate and regular rhythm.  Pulmonary/Chest: Effort normal. She has no wheezes. She has no rales.  Abdominal: Soft. She exhibits no distension. There is no tenderness.  Musculoskeletal: She exhibits edema (trace pitting edema in bilateral lower legs). She exhibits no tenderness.  Neurological: She is alert and oriented to person, place, and time.  Skin: Skin is warm and dry.   Psychiatric: She has a normal mood and affect. Her behavior is normal. Thought content normal.  Nursing note and vitals reviewed.   Assessment & Plan:  1: Chronic heart failure with preserved ejection fraction- - NYHA class III - euvolemic today  - weighing daily and weight continues to decline; reminded to call for an overnight weight gain of >2 pounds or a weekly weight gain of >5 pounds - weight down 4 pounds since 10/09/17 - edema present in lower legs but much improved - elevating legs - taking lasix/potassium ~ twice week depending on weight/edema - saw cardiologist Rockey Situ) 06/10/17 - BNP from 09/01/17 was 2822.0 - will get a BMP today since she's now taking the diuretic/potassium  2: HTN- - BP initially elevated but much improved upon recheck - saw PCP Damita Dunnings) 10/09/17 & diuretic/potassium adjusted - BMP on 10/09/17 reviewed and showed sodium 129, potassium 4.6 and GFR 53.31.   3: Paroxymal atrial fibrillation- - currently rate controlled  - taking atenolol & digoxin - dig level on 08/31/17 was 0.7  Medication list was reviewed.  Return in 4 months or sooner for any questions/problems before then.

## 2017-11-17 ENCOUNTER — Encounter: Payer: Self-pay | Admitting: Family

## 2017-11-17 ENCOUNTER — Ambulatory Visit: Attending: Family | Admitting: Family

## 2017-11-17 VITALS — BP 130/60 | HR 72 | Resp 18 | Ht 65.0 in | Wt 126.2 lb

## 2017-11-17 DIAGNOSIS — Z7982 Long term (current) use of aspirin: Secondary | ICD-10-CM | POA: Insufficient documentation

## 2017-11-17 DIAGNOSIS — Z951 Presence of aortocoronary bypass graft: Secondary | ICD-10-CM | POA: Insufficient documentation

## 2017-11-17 DIAGNOSIS — I2510 Atherosclerotic heart disease of native coronary artery without angina pectoris: Secondary | ICD-10-CM | POA: Diagnosis not present

## 2017-11-17 DIAGNOSIS — J45909 Unspecified asthma, uncomplicated: Secondary | ICD-10-CM | POA: Diagnosis not present

## 2017-11-17 DIAGNOSIS — F0390 Unspecified dementia without behavioral disturbance: Secondary | ICD-10-CM | POA: Insufficient documentation

## 2017-11-17 DIAGNOSIS — Z880 Allergy status to penicillin: Secondary | ICD-10-CM | POA: Insufficient documentation

## 2017-11-17 DIAGNOSIS — K219 Gastro-esophageal reflux disease without esophagitis: Secondary | ICD-10-CM | POA: Insufficient documentation

## 2017-11-17 DIAGNOSIS — H9190 Unspecified hearing loss, unspecified ear: Secondary | ICD-10-CM | POA: Diagnosis not present

## 2017-11-17 DIAGNOSIS — Z79899 Other long term (current) drug therapy: Secondary | ICD-10-CM | POA: Diagnosis not present

## 2017-11-17 DIAGNOSIS — E039 Hypothyroidism, unspecified: Secondary | ICD-10-CM | POA: Diagnosis not present

## 2017-11-17 DIAGNOSIS — Z9049 Acquired absence of other specified parts of digestive tract: Secondary | ICD-10-CM | POA: Diagnosis not present

## 2017-11-17 DIAGNOSIS — I5032 Chronic diastolic (congestive) heart failure: Secondary | ICD-10-CM | POA: Insufficient documentation

## 2017-11-17 DIAGNOSIS — E119 Type 2 diabetes mellitus without complications: Secondary | ICD-10-CM | POA: Diagnosis not present

## 2017-11-17 DIAGNOSIS — I480 Paroxysmal atrial fibrillation: Secondary | ICD-10-CM | POA: Insufficient documentation

## 2017-11-17 DIAGNOSIS — E785 Hyperlipidemia, unspecified: Secondary | ICD-10-CM | POA: Diagnosis not present

## 2017-11-17 DIAGNOSIS — I110 Hypertensive heart disease with heart failure: Secondary | ICD-10-CM | POA: Diagnosis not present

## 2017-11-17 DIAGNOSIS — I252 Old myocardial infarction: Secondary | ICD-10-CM | POA: Diagnosis not present

## 2017-11-17 DIAGNOSIS — Z9889 Other specified postprocedural states: Secondary | ICD-10-CM | POA: Diagnosis not present

## 2017-11-17 DIAGNOSIS — I25118 Atherosclerotic heart disease of native coronary artery with other forms of angina pectoris: Secondary | ICD-10-CM | POA: Diagnosis not present

## 2017-11-17 DIAGNOSIS — L89152 Pressure ulcer of sacral region, stage 2: Secondary | ICD-10-CM | POA: Diagnosis not present

## 2017-11-17 DIAGNOSIS — Z8673 Personal history of transient ischemic attack (TIA), and cerebral infarction without residual deficits: Secondary | ICD-10-CM | POA: Insufficient documentation

## 2017-11-17 DIAGNOSIS — I10 Essential (primary) hypertension: Secondary | ICD-10-CM

## 2017-11-17 DIAGNOSIS — I5033 Acute on chronic diastolic (congestive) heart failure: Secondary | ICD-10-CM | POA: Diagnosis not present

## 2017-11-17 DIAGNOSIS — Z90710 Acquired absence of both cervix and uterus: Secondary | ICD-10-CM | POA: Insufficient documentation

## 2017-11-17 DIAGNOSIS — Z8249 Family history of ischemic heart disease and other diseases of the circulatory system: Secondary | ICD-10-CM | POA: Insufficient documentation

## 2017-11-17 LAB — BASIC METABOLIC PANEL
Anion gap: 10 (ref 5–15)
BUN: 15 mg/dL (ref 6–20)
CO2: 25 mmol/L (ref 22–32)
CREATININE: 0.9 mg/dL (ref 0.44–1.00)
Calcium: 9.4 mg/dL (ref 8.9–10.3)
Chloride: 99 mmol/L — ABNORMAL LOW (ref 101–111)
GFR calc Af Amer: >60 mL/min (ref >60)
GFR, EST NON AFRICAN AMERICAN: 52 mL/min — AB (ref >60)
Glucose, Bld: 106 mg/dL — ABNORMAL HIGH (ref 65–99)
POTASSIUM: 4.3 mmol/L (ref 3.5–5.1)
Sodium: 134 mmol/L — ABNORMAL LOW (ref 135–145)

## 2017-11-17 NOTE — Patient Instructions (Signed)
Continue weighing daily and call for an overnight weight gain of > 2 pounds or a weekly weight gain of >5 pounds. 

## 2017-11-18 DIAGNOSIS — I25118 Atherosclerotic heart disease of native coronary artery with other forms of angina pectoris: Secondary | ICD-10-CM | POA: Diagnosis not present

## 2017-11-18 DIAGNOSIS — I480 Paroxysmal atrial fibrillation: Secondary | ICD-10-CM | POA: Diagnosis not present

## 2017-11-18 DIAGNOSIS — L89152 Pressure ulcer of sacral region, stage 2: Secondary | ICD-10-CM | POA: Diagnosis not present

## 2017-11-18 DIAGNOSIS — Z8673 Personal history of transient ischemic attack (TIA), and cerebral infarction without residual deficits: Secondary | ICD-10-CM | POA: Diagnosis not present

## 2017-11-18 DIAGNOSIS — I110 Hypertensive heart disease with heart failure: Secondary | ICD-10-CM | POA: Diagnosis not present

## 2017-11-18 DIAGNOSIS — I5033 Acute on chronic diastolic (congestive) heart failure: Secondary | ICD-10-CM | POA: Diagnosis not present

## 2017-11-20 DIAGNOSIS — I5033 Acute on chronic diastolic (congestive) heart failure: Secondary | ICD-10-CM | POA: Diagnosis not present

## 2017-11-20 DIAGNOSIS — Z8673 Personal history of transient ischemic attack (TIA), and cerebral infarction without residual deficits: Secondary | ICD-10-CM | POA: Diagnosis not present

## 2017-11-20 DIAGNOSIS — I25118 Atherosclerotic heart disease of native coronary artery with other forms of angina pectoris: Secondary | ICD-10-CM | POA: Diagnosis not present

## 2017-11-20 DIAGNOSIS — I110 Hypertensive heart disease with heart failure: Secondary | ICD-10-CM | POA: Diagnosis not present

## 2017-11-20 DIAGNOSIS — I480 Paroxysmal atrial fibrillation: Secondary | ICD-10-CM | POA: Diagnosis not present

## 2017-11-20 DIAGNOSIS — L89152 Pressure ulcer of sacral region, stage 2: Secondary | ICD-10-CM | POA: Diagnosis not present

## 2017-11-24 DIAGNOSIS — Z8673 Personal history of transient ischemic attack (TIA), and cerebral infarction without residual deficits: Secondary | ICD-10-CM | POA: Diagnosis not present

## 2017-11-24 DIAGNOSIS — I110 Hypertensive heart disease with heart failure: Secondary | ICD-10-CM | POA: Diagnosis not present

## 2017-11-24 DIAGNOSIS — I25118 Atherosclerotic heart disease of native coronary artery with other forms of angina pectoris: Secondary | ICD-10-CM | POA: Diagnosis not present

## 2017-11-24 DIAGNOSIS — L89152 Pressure ulcer of sacral region, stage 2: Secondary | ICD-10-CM | POA: Diagnosis not present

## 2017-11-24 DIAGNOSIS — I480 Paroxysmal atrial fibrillation: Secondary | ICD-10-CM | POA: Diagnosis not present

## 2017-11-24 DIAGNOSIS — I5033 Acute on chronic diastolic (congestive) heart failure: Secondary | ICD-10-CM | POA: Diagnosis not present

## 2017-11-25 DIAGNOSIS — L89152 Pressure ulcer of sacral region, stage 2: Secondary | ICD-10-CM | POA: Diagnosis not present

## 2017-11-25 DIAGNOSIS — Z8673 Personal history of transient ischemic attack (TIA), and cerebral infarction without residual deficits: Secondary | ICD-10-CM | POA: Diagnosis not present

## 2017-11-25 DIAGNOSIS — I5033 Acute on chronic diastolic (congestive) heart failure: Secondary | ICD-10-CM | POA: Diagnosis not present

## 2017-11-25 DIAGNOSIS — I25118 Atherosclerotic heart disease of native coronary artery with other forms of angina pectoris: Secondary | ICD-10-CM | POA: Diagnosis not present

## 2017-11-25 DIAGNOSIS — I110 Hypertensive heart disease with heart failure: Secondary | ICD-10-CM | POA: Diagnosis not present

## 2017-11-25 DIAGNOSIS — I480 Paroxysmal atrial fibrillation: Secondary | ICD-10-CM | POA: Diagnosis not present

## 2017-11-27 DIAGNOSIS — Z8673 Personal history of transient ischemic attack (TIA), and cerebral infarction without residual deficits: Secondary | ICD-10-CM | POA: Diagnosis not present

## 2017-11-27 DIAGNOSIS — L89152 Pressure ulcer of sacral region, stage 2: Secondary | ICD-10-CM | POA: Diagnosis not present

## 2017-11-27 DIAGNOSIS — I480 Paroxysmal atrial fibrillation: Secondary | ICD-10-CM | POA: Diagnosis not present

## 2017-11-27 DIAGNOSIS — I110 Hypertensive heart disease with heart failure: Secondary | ICD-10-CM | POA: Diagnosis not present

## 2017-11-27 DIAGNOSIS — I25118 Atherosclerotic heart disease of native coronary artery with other forms of angina pectoris: Secondary | ICD-10-CM | POA: Diagnosis not present

## 2017-11-27 DIAGNOSIS — I5033 Acute on chronic diastolic (congestive) heart failure: Secondary | ICD-10-CM | POA: Diagnosis not present

## 2017-12-01 DIAGNOSIS — I480 Paroxysmal atrial fibrillation: Secondary | ICD-10-CM | POA: Diagnosis not present

## 2017-12-01 DIAGNOSIS — Z8673 Personal history of transient ischemic attack (TIA), and cerebral infarction without residual deficits: Secondary | ICD-10-CM | POA: Diagnosis not present

## 2017-12-01 DIAGNOSIS — I5033 Acute on chronic diastolic (congestive) heart failure: Secondary | ICD-10-CM | POA: Diagnosis not present

## 2017-12-01 DIAGNOSIS — I25118 Atherosclerotic heart disease of native coronary artery with other forms of angina pectoris: Secondary | ICD-10-CM | POA: Diagnosis not present

## 2017-12-01 DIAGNOSIS — L89152 Pressure ulcer of sacral region, stage 2: Secondary | ICD-10-CM | POA: Diagnosis not present

## 2017-12-01 DIAGNOSIS — I110 Hypertensive heart disease with heart failure: Secondary | ICD-10-CM | POA: Diagnosis not present

## 2017-12-04 DIAGNOSIS — I110 Hypertensive heart disease with heart failure: Secondary | ICD-10-CM | POA: Diagnosis not present

## 2017-12-04 DIAGNOSIS — I5033 Acute on chronic diastolic (congestive) heart failure: Secondary | ICD-10-CM | POA: Diagnosis not present

## 2017-12-04 DIAGNOSIS — L89152 Pressure ulcer of sacral region, stage 2: Secondary | ICD-10-CM | POA: Diagnosis not present

## 2017-12-04 DIAGNOSIS — I25118 Atherosclerotic heart disease of native coronary artery with other forms of angina pectoris: Secondary | ICD-10-CM | POA: Diagnosis not present

## 2017-12-04 DIAGNOSIS — Z8673 Personal history of transient ischemic attack (TIA), and cerebral infarction without residual deficits: Secondary | ICD-10-CM | POA: Diagnosis not present

## 2017-12-04 DIAGNOSIS — I480 Paroxysmal atrial fibrillation: Secondary | ICD-10-CM | POA: Diagnosis not present

## 2017-12-08 DIAGNOSIS — E785 Hyperlipidemia, unspecified: Secondary | ICD-10-CM | POA: Diagnosis not present

## 2017-12-08 DIAGNOSIS — K219 Gastro-esophageal reflux disease without esophagitis: Secondary | ICD-10-CM | POA: Diagnosis not present

## 2017-12-08 DIAGNOSIS — L89152 Pressure ulcer of sacral region, stage 2: Secondary | ICD-10-CM | POA: Diagnosis not present

## 2017-12-08 DIAGNOSIS — I110 Hypertensive heart disease with heart failure: Secondary | ICD-10-CM | POA: Diagnosis not present

## 2017-12-08 DIAGNOSIS — I480 Paroxysmal atrial fibrillation: Secondary | ICD-10-CM | POA: Diagnosis not present

## 2017-12-08 DIAGNOSIS — Z8673 Personal history of transient ischemic attack (TIA), and cerebral infarction without residual deficits: Secondary | ICD-10-CM | POA: Diagnosis not present

## 2017-12-08 DIAGNOSIS — Z951 Presence of aortocoronary bypass graft: Secondary | ICD-10-CM | POA: Diagnosis not present

## 2017-12-08 DIAGNOSIS — D649 Anemia, unspecified: Secondary | ICD-10-CM | POA: Diagnosis not present

## 2017-12-08 DIAGNOSIS — E039 Hypothyroidism, unspecified: Secondary | ICD-10-CM | POA: Diagnosis not present

## 2017-12-08 DIAGNOSIS — I25118 Atherosclerotic heart disease of native coronary artery with other forms of angina pectoris: Secondary | ICD-10-CM | POA: Diagnosis not present

## 2017-12-08 DIAGNOSIS — I5033 Acute on chronic diastolic (congestive) heart failure: Secondary | ICD-10-CM | POA: Diagnosis not present

## 2017-12-10 DIAGNOSIS — I25118 Atherosclerotic heart disease of native coronary artery with other forms of angina pectoris: Secondary | ICD-10-CM | POA: Diagnosis not present

## 2017-12-10 DIAGNOSIS — I480 Paroxysmal atrial fibrillation: Secondary | ICD-10-CM | POA: Diagnosis not present

## 2017-12-10 DIAGNOSIS — Z8673 Personal history of transient ischemic attack (TIA), and cerebral infarction without residual deficits: Secondary | ICD-10-CM | POA: Diagnosis not present

## 2017-12-10 DIAGNOSIS — L89152 Pressure ulcer of sacral region, stage 2: Secondary | ICD-10-CM | POA: Diagnosis not present

## 2017-12-10 DIAGNOSIS — I5033 Acute on chronic diastolic (congestive) heart failure: Secondary | ICD-10-CM | POA: Diagnosis not present

## 2017-12-10 DIAGNOSIS — I110 Hypertensive heart disease with heart failure: Secondary | ICD-10-CM | POA: Diagnosis not present

## 2017-12-11 DIAGNOSIS — I480 Paroxysmal atrial fibrillation: Secondary | ICD-10-CM | POA: Diagnosis not present

## 2017-12-11 DIAGNOSIS — Z8673 Personal history of transient ischemic attack (TIA), and cerebral infarction without residual deficits: Secondary | ICD-10-CM | POA: Diagnosis not present

## 2017-12-11 DIAGNOSIS — L89152 Pressure ulcer of sacral region, stage 2: Secondary | ICD-10-CM | POA: Diagnosis not present

## 2017-12-11 DIAGNOSIS — I25118 Atherosclerotic heart disease of native coronary artery with other forms of angina pectoris: Secondary | ICD-10-CM | POA: Diagnosis not present

## 2017-12-11 DIAGNOSIS — I5033 Acute on chronic diastolic (congestive) heart failure: Secondary | ICD-10-CM | POA: Diagnosis not present

## 2017-12-11 DIAGNOSIS — I110 Hypertensive heart disease with heart failure: Secondary | ICD-10-CM | POA: Diagnosis not present

## 2017-12-15 DIAGNOSIS — I110 Hypertensive heart disease with heart failure: Secondary | ICD-10-CM | POA: Diagnosis not present

## 2017-12-15 DIAGNOSIS — I25118 Atherosclerotic heart disease of native coronary artery with other forms of angina pectoris: Secondary | ICD-10-CM | POA: Diagnosis not present

## 2017-12-15 DIAGNOSIS — I480 Paroxysmal atrial fibrillation: Secondary | ICD-10-CM | POA: Diagnosis not present

## 2017-12-15 DIAGNOSIS — Z8673 Personal history of transient ischemic attack (TIA), and cerebral infarction without residual deficits: Secondary | ICD-10-CM | POA: Diagnosis not present

## 2017-12-15 DIAGNOSIS — I5033 Acute on chronic diastolic (congestive) heart failure: Secondary | ICD-10-CM | POA: Diagnosis not present

## 2017-12-15 DIAGNOSIS — L89152 Pressure ulcer of sacral region, stage 2: Secondary | ICD-10-CM | POA: Diagnosis not present

## 2017-12-17 DIAGNOSIS — I110 Hypertensive heart disease with heart failure: Secondary | ICD-10-CM | POA: Diagnosis not present

## 2017-12-17 DIAGNOSIS — I25118 Atherosclerotic heart disease of native coronary artery with other forms of angina pectoris: Secondary | ICD-10-CM | POA: Diagnosis not present

## 2017-12-17 DIAGNOSIS — L89152 Pressure ulcer of sacral region, stage 2: Secondary | ICD-10-CM | POA: Diagnosis not present

## 2017-12-17 DIAGNOSIS — Z8673 Personal history of transient ischemic attack (TIA), and cerebral infarction without residual deficits: Secondary | ICD-10-CM | POA: Diagnosis not present

## 2017-12-17 DIAGNOSIS — I480 Paroxysmal atrial fibrillation: Secondary | ICD-10-CM | POA: Diagnosis not present

## 2017-12-17 DIAGNOSIS — I5033 Acute on chronic diastolic (congestive) heart failure: Secondary | ICD-10-CM | POA: Diagnosis not present

## 2017-12-18 DIAGNOSIS — L89152 Pressure ulcer of sacral region, stage 2: Secondary | ICD-10-CM | POA: Diagnosis not present

## 2017-12-18 DIAGNOSIS — Z8673 Personal history of transient ischemic attack (TIA), and cerebral infarction without residual deficits: Secondary | ICD-10-CM | POA: Diagnosis not present

## 2017-12-18 DIAGNOSIS — I25118 Atherosclerotic heart disease of native coronary artery with other forms of angina pectoris: Secondary | ICD-10-CM | POA: Diagnosis not present

## 2017-12-18 DIAGNOSIS — I5033 Acute on chronic diastolic (congestive) heart failure: Secondary | ICD-10-CM | POA: Diagnosis not present

## 2017-12-18 DIAGNOSIS — I480 Paroxysmal atrial fibrillation: Secondary | ICD-10-CM | POA: Diagnosis not present

## 2017-12-18 DIAGNOSIS — I110 Hypertensive heart disease with heart failure: Secondary | ICD-10-CM | POA: Diagnosis not present

## 2017-12-22 DIAGNOSIS — Z8673 Personal history of transient ischemic attack (TIA), and cerebral infarction without residual deficits: Secondary | ICD-10-CM | POA: Diagnosis not present

## 2017-12-22 DIAGNOSIS — I25118 Atherosclerotic heart disease of native coronary artery with other forms of angina pectoris: Secondary | ICD-10-CM | POA: Diagnosis not present

## 2017-12-22 DIAGNOSIS — I5033 Acute on chronic diastolic (congestive) heart failure: Secondary | ICD-10-CM | POA: Diagnosis not present

## 2017-12-22 DIAGNOSIS — L89152 Pressure ulcer of sacral region, stage 2: Secondary | ICD-10-CM | POA: Diagnosis not present

## 2017-12-22 DIAGNOSIS — I480 Paroxysmal atrial fibrillation: Secondary | ICD-10-CM | POA: Diagnosis not present

## 2017-12-22 DIAGNOSIS — I110 Hypertensive heart disease with heart failure: Secondary | ICD-10-CM | POA: Diagnosis not present

## 2017-12-23 DIAGNOSIS — I5033 Acute on chronic diastolic (congestive) heart failure: Secondary | ICD-10-CM | POA: Diagnosis not present

## 2017-12-23 DIAGNOSIS — I110 Hypertensive heart disease with heart failure: Secondary | ICD-10-CM | POA: Diagnosis not present

## 2017-12-23 DIAGNOSIS — I25118 Atherosclerotic heart disease of native coronary artery with other forms of angina pectoris: Secondary | ICD-10-CM | POA: Diagnosis not present

## 2017-12-23 DIAGNOSIS — I480 Paroxysmal atrial fibrillation: Secondary | ICD-10-CM | POA: Diagnosis not present

## 2017-12-23 DIAGNOSIS — L89152 Pressure ulcer of sacral region, stage 2: Secondary | ICD-10-CM | POA: Diagnosis not present

## 2017-12-23 DIAGNOSIS — Z8673 Personal history of transient ischemic attack (TIA), and cerebral infarction without residual deficits: Secondary | ICD-10-CM | POA: Diagnosis not present

## 2017-12-24 DIAGNOSIS — I480 Paroxysmal atrial fibrillation: Secondary | ICD-10-CM | POA: Diagnosis not present

## 2017-12-24 DIAGNOSIS — I5033 Acute on chronic diastolic (congestive) heart failure: Secondary | ICD-10-CM | POA: Diagnosis not present

## 2017-12-24 DIAGNOSIS — I110 Hypertensive heart disease with heart failure: Secondary | ICD-10-CM | POA: Diagnosis not present

## 2017-12-24 DIAGNOSIS — Z8673 Personal history of transient ischemic attack (TIA), and cerebral infarction without residual deficits: Secondary | ICD-10-CM | POA: Diagnosis not present

## 2017-12-24 DIAGNOSIS — I25118 Atherosclerotic heart disease of native coronary artery with other forms of angina pectoris: Secondary | ICD-10-CM | POA: Diagnosis not present

## 2017-12-24 DIAGNOSIS — L89152 Pressure ulcer of sacral region, stage 2: Secondary | ICD-10-CM | POA: Diagnosis not present

## 2017-12-25 DIAGNOSIS — I480 Paroxysmal atrial fibrillation: Secondary | ICD-10-CM | POA: Diagnosis not present

## 2017-12-25 DIAGNOSIS — L89152 Pressure ulcer of sacral region, stage 2: Secondary | ICD-10-CM | POA: Diagnosis not present

## 2017-12-25 DIAGNOSIS — I5033 Acute on chronic diastolic (congestive) heart failure: Secondary | ICD-10-CM | POA: Diagnosis not present

## 2017-12-25 DIAGNOSIS — Z8673 Personal history of transient ischemic attack (TIA), and cerebral infarction without residual deficits: Secondary | ICD-10-CM | POA: Diagnosis not present

## 2017-12-25 DIAGNOSIS — I25118 Atherosclerotic heart disease of native coronary artery with other forms of angina pectoris: Secondary | ICD-10-CM | POA: Diagnosis not present

## 2017-12-25 DIAGNOSIS — I110 Hypertensive heart disease with heart failure: Secondary | ICD-10-CM | POA: Diagnosis not present

## 2017-12-29 DIAGNOSIS — I110 Hypertensive heart disease with heart failure: Secondary | ICD-10-CM | POA: Diagnosis not present

## 2017-12-29 DIAGNOSIS — I5033 Acute on chronic diastolic (congestive) heart failure: Secondary | ICD-10-CM | POA: Diagnosis not present

## 2017-12-29 DIAGNOSIS — L89152 Pressure ulcer of sacral region, stage 2: Secondary | ICD-10-CM | POA: Diagnosis not present

## 2017-12-29 DIAGNOSIS — I480 Paroxysmal atrial fibrillation: Secondary | ICD-10-CM | POA: Diagnosis not present

## 2017-12-29 DIAGNOSIS — I25118 Atherosclerotic heart disease of native coronary artery with other forms of angina pectoris: Secondary | ICD-10-CM | POA: Diagnosis not present

## 2017-12-29 DIAGNOSIS — Z8673 Personal history of transient ischemic attack (TIA), and cerebral infarction without residual deficits: Secondary | ICD-10-CM | POA: Diagnosis not present

## 2017-12-30 DIAGNOSIS — I110 Hypertensive heart disease with heart failure: Secondary | ICD-10-CM | POA: Diagnosis not present

## 2017-12-30 DIAGNOSIS — I5033 Acute on chronic diastolic (congestive) heart failure: Secondary | ICD-10-CM | POA: Diagnosis not present

## 2017-12-30 DIAGNOSIS — Z8673 Personal history of transient ischemic attack (TIA), and cerebral infarction without residual deficits: Secondary | ICD-10-CM | POA: Diagnosis not present

## 2017-12-30 DIAGNOSIS — I25118 Atherosclerotic heart disease of native coronary artery with other forms of angina pectoris: Secondary | ICD-10-CM | POA: Diagnosis not present

## 2017-12-30 DIAGNOSIS — I480 Paroxysmal atrial fibrillation: Secondary | ICD-10-CM | POA: Diagnosis not present

## 2017-12-30 DIAGNOSIS — L89152 Pressure ulcer of sacral region, stage 2: Secondary | ICD-10-CM | POA: Diagnosis not present

## 2017-12-31 DIAGNOSIS — I25118 Atherosclerotic heart disease of native coronary artery with other forms of angina pectoris: Secondary | ICD-10-CM | POA: Diagnosis not present

## 2017-12-31 DIAGNOSIS — Z8673 Personal history of transient ischemic attack (TIA), and cerebral infarction without residual deficits: Secondary | ICD-10-CM | POA: Diagnosis not present

## 2017-12-31 DIAGNOSIS — I110 Hypertensive heart disease with heart failure: Secondary | ICD-10-CM | POA: Diagnosis not present

## 2017-12-31 DIAGNOSIS — I480 Paroxysmal atrial fibrillation: Secondary | ICD-10-CM | POA: Diagnosis not present

## 2017-12-31 DIAGNOSIS — I5033 Acute on chronic diastolic (congestive) heart failure: Secondary | ICD-10-CM | POA: Diagnosis not present

## 2017-12-31 DIAGNOSIS — L89152 Pressure ulcer of sacral region, stage 2: Secondary | ICD-10-CM | POA: Diagnosis not present

## 2018-01-01 DIAGNOSIS — I5033 Acute on chronic diastolic (congestive) heart failure: Secondary | ICD-10-CM | POA: Diagnosis not present

## 2018-01-01 DIAGNOSIS — L89152 Pressure ulcer of sacral region, stage 2: Secondary | ICD-10-CM | POA: Diagnosis not present

## 2018-01-01 DIAGNOSIS — I25118 Atherosclerotic heart disease of native coronary artery with other forms of angina pectoris: Secondary | ICD-10-CM | POA: Diagnosis not present

## 2018-01-01 DIAGNOSIS — I110 Hypertensive heart disease with heart failure: Secondary | ICD-10-CM | POA: Diagnosis not present

## 2018-01-01 DIAGNOSIS — I480 Paroxysmal atrial fibrillation: Secondary | ICD-10-CM | POA: Diagnosis not present

## 2018-01-01 DIAGNOSIS — Z8673 Personal history of transient ischemic attack (TIA), and cerebral infarction without residual deficits: Secondary | ICD-10-CM | POA: Diagnosis not present

## 2018-01-03 DIAGNOSIS — I25118 Atherosclerotic heart disease of native coronary artery with other forms of angina pectoris: Secondary | ICD-10-CM | POA: Diagnosis not present

## 2018-01-03 DIAGNOSIS — L89152 Pressure ulcer of sacral region, stage 2: Secondary | ICD-10-CM | POA: Diagnosis not present

## 2018-01-03 DIAGNOSIS — Z8673 Personal history of transient ischemic attack (TIA), and cerebral infarction without residual deficits: Secondary | ICD-10-CM | POA: Diagnosis not present

## 2018-01-03 DIAGNOSIS — I110 Hypertensive heart disease with heart failure: Secondary | ICD-10-CM | POA: Diagnosis not present

## 2018-01-03 DIAGNOSIS — I480 Paroxysmal atrial fibrillation: Secondary | ICD-10-CM | POA: Diagnosis not present

## 2018-01-03 DIAGNOSIS — I5033 Acute on chronic diastolic (congestive) heart failure: Secondary | ICD-10-CM | POA: Diagnosis not present

## 2018-01-05 DIAGNOSIS — I25118 Atherosclerotic heart disease of native coronary artery with other forms of angina pectoris: Secondary | ICD-10-CM | POA: Diagnosis not present

## 2018-01-05 DIAGNOSIS — L89152 Pressure ulcer of sacral region, stage 2: Secondary | ICD-10-CM | POA: Diagnosis not present

## 2018-01-05 DIAGNOSIS — I5033 Acute on chronic diastolic (congestive) heart failure: Secondary | ICD-10-CM | POA: Diagnosis not present

## 2018-01-05 DIAGNOSIS — I480 Paroxysmal atrial fibrillation: Secondary | ICD-10-CM | POA: Diagnosis not present

## 2018-01-05 DIAGNOSIS — Z8673 Personal history of transient ischemic attack (TIA), and cerebral infarction without residual deficits: Secondary | ICD-10-CM | POA: Diagnosis not present

## 2018-01-05 DIAGNOSIS — I110 Hypertensive heart disease with heart failure: Secondary | ICD-10-CM | POA: Diagnosis not present

## 2018-01-07 DIAGNOSIS — R627 Adult failure to thrive: Secondary | ICD-10-CM | POA: Diagnosis not present

## 2018-01-07 DIAGNOSIS — E785 Hyperlipidemia, unspecified: Secondary | ICD-10-CM | POA: Diagnosis not present

## 2018-01-07 DIAGNOSIS — I5033 Acute on chronic diastolic (congestive) heart failure: Secondary | ICD-10-CM | POA: Diagnosis not present

## 2018-01-07 DIAGNOSIS — I480 Paroxysmal atrial fibrillation: Secondary | ICD-10-CM | POA: Diagnosis not present

## 2018-01-07 DIAGNOSIS — D649 Anemia, unspecified: Secondary | ICD-10-CM | POA: Diagnosis not present

## 2018-01-07 DIAGNOSIS — Z951 Presence of aortocoronary bypass graft: Secondary | ICD-10-CM | POA: Diagnosis not present

## 2018-01-07 DIAGNOSIS — Z8673 Personal history of transient ischemic attack (TIA), and cerebral infarction without residual deficits: Secondary | ICD-10-CM | POA: Diagnosis not present

## 2018-01-07 DIAGNOSIS — I25118 Atherosclerotic heart disease of native coronary artery with other forms of angina pectoris: Secondary | ICD-10-CM | POA: Diagnosis not present

## 2018-01-07 DIAGNOSIS — E039 Hypothyroidism, unspecified: Secondary | ICD-10-CM | POA: Diagnosis not present

## 2018-01-07 DIAGNOSIS — I110 Hypertensive heart disease with heart failure: Secondary | ICD-10-CM | POA: Diagnosis not present

## 2018-01-07 DIAGNOSIS — Z9981 Dependence on supplemental oxygen: Secondary | ICD-10-CM | POA: Diagnosis not present

## 2018-01-07 DIAGNOSIS — K219 Gastro-esophageal reflux disease without esophagitis: Secondary | ICD-10-CM | POA: Diagnosis not present

## 2018-01-08 DIAGNOSIS — I5033 Acute on chronic diastolic (congestive) heart failure: Secondary | ICD-10-CM | POA: Diagnosis not present

## 2018-01-08 DIAGNOSIS — I25118 Atherosclerotic heart disease of native coronary artery with other forms of angina pectoris: Secondary | ICD-10-CM | POA: Diagnosis not present

## 2018-01-08 DIAGNOSIS — Z951 Presence of aortocoronary bypass graft: Secondary | ICD-10-CM | POA: Diagnosis not present

## 2018-01-08 DIAGNOSIS — I480 Paroxysmal atrial fibrillation: Secondary | ICD-10-CM | POA: Diagnosis not present

## 2018-01-08 DIAGNOSIS — Z8673 Personal history of transient ischemic attack (TIA), and cerebral infarction without residual deficits: Secondary | ICD-10-CM | POA: Diagnosis not present

## 2018-01-08 DIAGNOSIS — I110 Hypertensive heart disease with heart failure: Secondary | ICD-10-CM | POA: Diagnosis not present

## 2018-01-10 DIAGNOSIS — Z8673 Personal history of transient ischemic attack (TIA), and cerebral infarction without residual deficits: Secondary | ICD-10-CM | POA: Diagnosis not present

## 2018-01-10 DIAGNOSIS — I110 Hypertensive heart disease with heart failure: Secondary | ICD-10-CM | POA: Diagnosis not present

## 2018-01-10 DIAGNOSIS — I480 Paroxysmal atrial fibrillation: Secondary | ICD-10-CM | POA: Diagnosis not present

## 2018-01-10 DIAGNOSIS — Z951 Presence of aortocoronary bypass graft: Secondary | ICD-10-CM | POA: Diagnosis not present

## 2018-01-10 DIAGNOSIS — I25118 Atherosclerotic heart disease of native coronary artery with other forms of angina pectoris: Secondary | ICD-10-CM | POA: Diagnosis not present

## 2018-01-10 DIAGNOSIS — I5033 Acute on chronic diastolic (congestive) heart failure: Secondary | ICD-10-CM | POA: Diagnosis not present

## 2018-01-12 DIAGNOSIS — Z951 Presence of aortocoronary bypass graft: Secondary | ICD-10-CM | POA: Diagnosis not present

## 2018-01-12 DIAGNOSIS — I25118 Atherosclerotic heart disease of native coronary artery with other forms of angina pectoris: Secondary | ICD-10-CM | POA: Diagnosis not present

## 2018-01-12 DIAGNOSIS — I480 Paroxysmal atrial fibrillation: Secondary | ICD-10-CM | POA: Diagnosis not present

## 2018-01-12 DIAGNOSIS — Z8673 Personal history of transient ischemic attack (TIA), and cerebral infarction without residual deficits: Secondary | ICD-10-CM | POA: Diagnosis not present

## 2018-01-12 DIAGNOSIS — I5033 Acute on chronic diastolic (congestive) heart failure: Secondary | ICD-10-CM | POA: Diagnosis not present

## 2018-01-12 DIAGNOSIS — I110 Hypertensive heart disease with heart failure: Secondary | ICD-10-CM | POA: Diagnosis not present

## 2018-01-15 DIAGNOSIS — I5033 Acute on chronic diastolic (congestive) heart failure: Secondary | ICD-10-CM | POA: Diagnosis not present

## 2018-01-15 DIAGNOSIS — I25118 Atherosclerotic heart disease of native coronary artery with other forms of angina pectoris: Secondary | ICD-10-CM | POA: Diagnosis not present

## 2018-01-15 DIAGNOSIS — I110 Hypertensive heart disease with heart failure: Secondary | ICD-10-CM | POA: Diagnosis not present

## 2018-01-15 DIAGNOSIS — I480 Paroxysmal atrial fibrillation: Secondary | ICD-10-CM | POA: Diagnosis not present

## 2018-01-15 DIAGNOSIS — Z8673 Personal history of transient ischemic attack (TIA), and cerebral infarction without residual deficits: Secondary | ICD-10-CM | POA: Diagnosis not present

## 2018-01-15 DIAGNOSIS — Z951 Presence of aortocoronary bypass graft: Secondary | ICD-10-CM | POA: Diagnosis not present

## 2018-01-19 DIAGNOSIS — I25118 Atherosclerotic heart disease of native coronary artery with other forms of angina pectoris: Secondary | ICD-10-CM | POA: Diagnosis not present

## 2018-01-19 DIAGNOSIS — I110 Hypertensive heart disease with heart failure: Secondary | ICD-10-CM | POA: Diagnosis not present

## 2018-01-19 DIAGNOSIS — I5033 Acute on chronic diastolic (congestive) heart failure: Secondary | ICD-10-CM | POA: Diagnosis not present

## 2018-01-19 DIAGNOSIS — I480 Paroxysmal atrial fibrillation: Secondary | ICD-10-CM | POA: Diagnosis not present

## 2018-01-19 DIAGNOSIS — Z8673 Personal history of transient ischemic attack (TIA), and cerebral infarction without residual deficits: Secondary | ICD-10-CM | POA: Diagnosis not present

## 2018-01-19 DIAGNOSIS — Z951 Presence of aortocoronary bypass graft: Secondary | ICD-10-CM | POA: Diagnosis not present

## 2018-01-20 DIAGNOSIS — I480 Paroxysmal atrial fibrillation: Secondary | ICD-10-CM | POA: Diagnosis not present

## 2018-01-20 DIAGNOSIS — I110 Hypertensive heart disease with heart failure: Secondary | ICD-10-CM | POA: Diagnosis not present

## 2018-01-20 DIAGNOSIS — I5033 Acute on chronic diastolic (congestive) heart failure: Secondary | ICD-10-CM | POA: Diagnosis not present

## 2018-01-20 DIAGNOSIS — Z8673 Personal history of transient ischemic attack (TIA), and cerebral infarction without residual deficits: Secondary | ICD-10-CM | POA: Diagnosis not present

## 2018-01-20 DIAGNOSIS — Z951 Presence of aortocoronary bypass graft: Secondary | ICD-10-CM | POA: Diagnosis not present

## 2018-01-20 DIAGNOSIS — I25118 Atherosclerotic heart disease of native coronary artery with other forms of angina pectoris: Secondary | ICD-10-CM | POA: Diagnosis not present

## 2018-01-21 DIAGNOSIS — Z951 Presence of aortocoronary bypass graft: Secondary | ICD-10-CM | POA: Diagnosis not present

## 2018-01-21 DIAGNOSIS — I25118 Atherosclerotic heart disease of native coronary artery with other forms of angina pectoris: Secondary | ICD-10-CM | POA: Diagnosis not present

## 2018-01-21 DIAGNOSIS — I110 Hypertensive heart disease with heart failure: Secondary | ICD-10-CM | POA: Diagnosis not present

## 2018-01-21 DIAGNOSIS — I480 Paroxysmal atrial fibrillation: Secondary | ICD-10-CM | POA: Diagnosis not present

## 2018-01-21 DIAGNOSIS — Z8673 Personal history of transient ischemic attack (TIA), and cerebral infarction without residual deficits: Secondary | ICD-10-CM | POA: Diagnosis not present

## 2018-01-21 DIAGNOSIS — I5033 Acute on chronic diastolic (congestive) heart failure: Secondary | ICD-10-CM | POA: Diagnosis not present

## 2018-01-22 DIAGNOSIS — Z8673 Personal history of transient ischemic attack (TIA), and cerebral infarction without residual deficits: Secondary | ICD-10-CM | POA: Diagnosis not present

## 2018-01-22 DIAGNOSIS — I25118 Atherosclerotic heart disease of native coronary artery with other forms of angina pectoris: Secondary | ICD-10-CM | POA: Diagnosis not present

## 2018-01-22 DIAGNOSIS — I5033 Acute on chronic diastolic (congestive) heart failure: Secondary | ICD-10-CM | POA: Diagnosis not present

## 2018-01-22 DIAGNOSIS — I110 Hypertensive heart disease with heart failure: Secondary | ICD-10-CM | POA: Diagnosis not present

## 2018-01-22 DIAGNOSIS — I480 Paroxysmal atrial fibrillation: Secondary | ICD-10-CM | POA: Diagnosis not present

## 2018-01-22 DIAGNOSIS — Z951 Presence of aortocoronary bypass graft: Secondary | ICD-10-CM | POA: Diagnosis not present

## 2018-01-26 DIAGNOSIS — I110 Hypertensive heart disease with heart failure: Secondary | ICD-10-CM | POA: Diagnosis not present

## 2018-01-26 DIAGNOSIS — I25118 Atherosclerotic heart disease of native coronary artery with other forms of angina pectoris: Secondary | ICD-10-CM | POA: Diagnosis not present

## 2018-01-26 DIAGNOSIS — I5033 Acute on chronic diastolic (congestive) heart failure: Secondary | ICD-10-CM | POA: Diagnosis not present

## 2018-01-26 DIAGNOSIS — I480 Paroxysmal atrial fibrillation: Secondary | ICD-10-CM | POA: Diagnosis not present

## 2018-01-26 DIAGNOSIS — Z8673 Personal history of transient ischemic attack (TIA), and cerebral infarction without residual deficits: Secondary | ICD-10-CM | POA: Diagnosis not present

## 2018-01-26 DIAGNOSIS — Z951 Presence of aortocoronary bypass graft: Secondary | ICD-10-CM | POA: Diagnosis not present

## 2018-01-28 DIAGNOSIS — Z951 Presence of aortocoronary bypass graft: Secondary | ICD-10-CM | POA: Diagnosis not present

## 2018-01-28 DIAGNOSIS — I480 Paroxysmal atrial fibrillation: Secondary | ICD-10-CM | POA: Diagnosis not present

## 2018-01-28 DIAGNOSIS — I5033 Acute on chronic diastolic (congestive) heart failure: Secondary | ICD-10-CM | POA: Diagnosis not present

## 2018-01-28 DIAGNOSIS — Z8673 Personal history of transient ischemic attack (TIA), and cerebral infarction without residual deficits: Secondary | ICD-10-CM | POA: Diagnosis not present

## 2018-01-28 DIAGNOSIS — I110 Hypertensive heart disease with heart failure: Secondary | ICD-10-CM | POA: Diagnosis not present

## 2018-01-28 DIAGNOSIS — I25118 Atherosclerotic heart disease of native coronary artery with other forms of angina pectoris: Secondary | ICD-10-CM | POA: Diagnosis not present

## 2018-01-29 DIAGNOSIS — Z8673 Personal history of transient ischemic attack (TIA), and cerebral infarction without residual deficits: Secondary | ICD-10-CM | POA: Diagnosis not present

## 2018-01-29 DIAGNOSIS — I480 Paroxysmal atrial fibrillation: Secondary | ICD-10-CM | POA: Diagnosis not present

## 2018-01-29 DIAGNOSIS — I110 Hypertensive heart disease with heart failure: Secondary | ICD-10-CM | POA: Diagnosis not present

## 2018-01-29 DIAGNOSIS — I25118 Atherosclerotic heart disease of native coronary artery with other forms of angina pectoris: Secondary | ICD-10-CM | POA: Diagnosis not present

## 2018-01-29 DIAGNOSIS — Z951 Presence of aortocoronary bypass graft: Secondary | ICD-10-CM | POA: Diagnosis not present

## 2018-01-29 DIAGNOSIS — I5033 Acute on chronic diastolic (congestive) heart failure: Secondary | ICD-10-CM | POA: Diagnosis not present

## 2018-02-03 DIAGNOSIS — Z8673 Personal history of transient ischemic attack (TIA), and cerebral infarction without residual deficits: Secondary | ICD-10-CM | POA: Diagnosis not present

## 2018-02-03 DIAGNOSIS — I480 Paroxysmal atrial fibrillation: Secondary | ICD-10-CM | POA: Diagnosis not present

## 2018-02-03 DIAGNOSIS — I25118 Atherosclerotic heart disease of native coronary artery with other forms of angina pectoris: Secondary | ICD-10-CM | POA: Diagnosis not present

## 2018-02-03 DIAGNOSIS — I110 Hypertensive heart disease with heart failure: Secondary | ICD-10-CM | POA: Diagnosis not present

## 2018-02-03 DIAGNOSIS — Z951 Presence of aortocoronary bypass graft: Secondary | ICD-10-CM | POA: Diagnosis not present

## 2018-02-03 DIAGNOSIS — I5033 Acute on chronic diastolic (congestive) heart failure: Secondary | ICD-10-CM | POA: Diagnosis not present

## 2018-02-05 ENCOUNTER — Other Ambulatory Visit: Payer: Self-pay | Admitting: Family Medicine

## 2018-02-05 DIAGNOSIS — Z951 Presence of aortocoronary bypass graft: Secondary | ICD-10-CM | POA: Diagnosis not present

## 2018-02-05 DIAGNOSIS — I110 Hypertensive heart disease with heart failure: Secondary | ICD-10-CM | POA: Diagnosis not present

## 2018-02-05 DIAGNOSIS — I5033 Acute on chronic diastolic (congestive) heart failure: Secondary | ICD-10-CM | POA: Diagnosis not present

## 2018-02-05 DIAGNOSIS — I480 Paroxysmal atrial fibrillation: Secondary | ICD-10-CM | POA: Diagnosis not present

## 2018-02-05 DIAGNOSIS — I25118 Atherosclerotic heart disease of native coronary artery with other forms of angina pectoris: Secondary | ICD-10-CM | POA: Diagnosis not present

## 2018-02-05 DIAGNOSIS — Z8673 Personal history of transient ischemic attack (TIA), and cerebral infarction without residual deficits: Secondary | ICD-10-CM | POA: Diagnosis not present

## 2018-02-05 NOTE — Telephone Encounter (Signed)
Last office visit 10/09/2017.  Last refilled 01/08/2017 for #90 with 3 refills.  Ok to refill?

## 2018-02-06 NOTE — Telephone Encounter (Signed)
Sent. Thanks.   

## 2018-02-07 DIAGNOSIS — I5033 Acute on chronic diastolic (congestive) heart failure: Secondary | ICD-10-CM | POA: Diagnosis not present

## 2018-02-07 DIAGNOSIS — I25118 Atherosclerotic heart disease of native coronary artery with other forms of angina pectoris: Secondary | ICD-10-CM | POA: Diagnosis not present

## 2018-02-07 DIAGNOSIS — R627 Adult failure to thrive: Secondary | ICD-10-CM | POA: Diagnosis not present

## 2018-02-07 DIAGNOSIS — E039 Hypothyroidism, unspecified: Secondary | ICD-10-CM | POA: Diagnosis not present

## 2018-02-07 DIAGNOSIS — Z9981 Dependence on supplemental oxygen: Secondary | ICD-10-CM | POA: Diagnosis not present

## 2018-02-07 DIAGNOSIS — I480 Paroxysmal atrial fibrillation: Secondary | ICD-10-CM | POA: Diagnosis not present

## 2018-02-07 DIAGNOSIS — D649 Anemia, unspecified: Secondary | ICD-10-CM | POA: Diagnosis not present

## 2018-02-07 DIAGNOSIS — I110 Hypertensive heart disease with heart failure: Secondary | ICD-10-CM | POA: Diagnosis not present

## 2018-02-07 DIAGNOSIS — E785 Hyperlipidemia, unspecified: Secondary | ICD-10-CM | POA: Diagnosis not present

## 2018-02-07 DIAGNOSIS — Z951 Presence of aortocoronary bypass graft: Secondary | ICD-10-CM | POA: Diagnosis not present

## 2018-02-07 DIAGNOSIS — K219 Gastro-esophageal reflux disease without esophagitis: Secondary | ICD-10-CM | POA: Diagnosis not present

## 2018-02-07 DIAGNOSIS — Z8673 Personal history of transient ischemic attack (TIA), and cerebral infarction without residual deficits: Secondary | ICD-10-CM | POA: Diagnosis not present

## 2018-02-09 DIAGNOSIS — I480 Paroxysmal atrial fibrillation: Secondary | ICD-10-CM | POA: Diagnosis not present

## 2018-02-09 DIAGNOSIS — Z951 Presence of aortocoronary bypass graft: Secondary | ICD-10-CM | POA: Diagnosis not present

## 2018-02-09 DIAGNOSIS — I110 Hypertensive heart disease with heart failure: Secondary | ICD-10-CM | POA: Diagnosis not present

## 2018-02-09 DIAGNOSIS — Z8673 Personal history of transient ischemic attack (TIA), and cerebral infarction without residual deficits: Secondary | ICD-10-CM | POA: Diagnosis not present

## 2018-02-09 DIAGNOSIS — I5033 Acute on chronic diastolic (congestive) heart failure: Secondary | ICD-10-CM | POA: Diagnosis not present

## 2018-02-09 DIAGNOSIS — I25118 Atherosclerotic heart disease of native coronary artery with other forms of angina pectoris: Secondary | ICD-10-CM | POA: Diagnosis not present

## 2018-02-11 DIAGNOSIS — I5033 Acute on chronic diastolic (congestive) heart failure: Secondary | ICD-10-CM | POA: Diagnosis not present

## 2018-02-11 DIAGNOSIS — Z951 Presence of aortocoronary bypass graft: Secondary | ICD-10-CM | POA: Diagnosis not present

## 2018-02-11 DIAGNOSIS — I25118 Atherosclerotic heart disease of native coronary artery with other forms of angina pectoris: Secondary | ICD-10-CM | POA: Diagnosis not present

## 2018-02-11 DIAGNOSIS — I110 Hypertensive heart disease with heart failure: Secondary | ICD-10-CM | POA: Diagnosis not present

## 2018-02-11 DIAGNOSIS — I480 Paroxysmal atrial fibrillation: Secondary | ICD-10-CM | POA: Diagnosis not present

## 2018-02-11 DIAGNOSIS — Z8673 Personal history of transient ischemic attack (TIA), and cerebral infarction without residual deficits: Secondary | ICD-10-CM | POA: Diagnosis not present

## 2018-02-12 DIAGNOSIS — I25118 Atherosclerotic heart disease of native coronary artery with other forms of angina pectoris: Secondary | ICD-10-CM | POA: Diagnosis not present

## 2018-02-12 DIAGNOSIS — I480 Paroxysmal atrial fibrillation: Secondary | ICD-10-CM | POA: Diagnosis not present

## 2018-02-12 DIAGNOSIS — I5033 Acute on chronic diastolic (congestive) heart failure: Secondary | ICD-10-CM | POA: Diagnosis not present

## 2018-02-12 DIAGNOSIS — Z8673 Personal history of transient ischemic attack (TIA), and cerebral infarction without residual deficits: Secondary | ICD-10-CM | POA: Diagnosis not present

## 2018-02-12 DIAGNOSIS — I110 Hypertensive heart disease with heart failure: Secondary | ICD-10-CM | POA: Diagnosis not present

## 2018-02-12 DIAGNOSIS — Z951 Presence of aortocoronary bypass graft: Secondary | ICD-10-CM | POA: Diagnosis not present

## 2018-02-14 DIAGNOSIS — I5033 Acute on chronic diastolic (congestive) heart failure: Secondary | ICD-10-CM | POA: Diagnosis not present

## 2018-02-14 DIAGNOSIS — Z8673 Personal history of transient ischemic attack (TIA), and cerebral infarction without residual deficits: Secondary | ICD-10-CM | POA: Diagnosis not present

## 2018-02-14 DIAGNOSIS — I480 Paroxysmal atrial fibrillation: Secondary | ICD-10-CM | POA: Diagnosis not present

## 2018-02-14 DIAGNOSIS — Z951 Presence of aortocoronary bypass graft: Secondary | ICD-10-CM | POA: Diagnosis not present

## 2018-02-14 DIAGNOSIS — I25118 Atherosclerotic heart disease of native coronary artery with other forms of angina pectoris: Secondary | ICD-10-CM | POA: Diagnosis not present

## 2018-02-14 DIAGNOSIS — I110 Hypertensive heart disease with heart failure: Secondary | ICD-10-CM | POA: Diagnosis not present

## 2018-02-16 ENCOUNTER — Other Ambulatory Visit: Payer: Self-pay | Admitting: Family Medicine

## 2018-02-16 DIAGNOSIS — I480 Paroxysmal atrial fibrillation: Secondary | ICD-10-CM | POA: Diagnosis not present

## 2018-02-16 DIAGNOSIS — I25118 Atherosclerotic heart disease of native coronary artery with other forms of angina pectoris: Secondary | ICD-10-CM | POA: Diagnosis not present

## 2018-02-16 DIAGNOSIS — I110 Hypertensive heart disease with heart failure: Secondary | ICD-10-CM | POA: Diagnosis not present

## 2018-02-16 DIAGNOSIS — Z951 Presence of aortocoronary bypass graft: Secondary | ICD-10-CM | POA: Diagnosis not present

## 2018-02-16 DIAGNOSIS — Z8673 Personal history of transient ischemic attack (TIA), and cerebral infarction without residual deficits: Secondary | ICD-10-CM | POA: Diagnosis not present

## 2018-02-16 DIAGNOSIS — I5033 Acute on chronic diastolic (congestive) heart failure: Secondary | ICD-10-CM | POA: Diagnosis not present

## 2018-02-18 DIAGNOSIS — I480 Paroxysmal atrial fibrillation: Secondary | ICD-10-CM | POA: Diagnosis not present

## 2018-02-18 DIAGNOSIS — Z951 Presence of aortocoronary bypass graft: Secondary | ICD-10-CM | POA: Diagnosis not present

## 2018-02-18 DIAGNOSIS — I25118 Atherosclerotic heart disease of native coronary artery with other forms of angina pectoris: Secondary | ICD-10-CM | POA: Diagnosis not present

## 2018-02-18 DIAGNOSIS — Z8673 Personal history of transient ischemic attack (TIA), and cerebral infarction without residual deficits: Secondary | ICD-10-CM | POA: Diagnosis not present

## 2018-02-18 DIAGNOSIS — I5033 Acute on chronic diastolic (congestive) heart failure: Secondary | ICD-10-CM | POA: Diagnosis not present

## 2018-02-18 DIAGNOSIS — I110 Hypertensive heart disease with heart failure: Secondary | ICD-10-CM | POA: Diagnosis not present

## 2018-02-19 ENCOUNTER — Other Ambulatory Visit: Payer: Self-pay | Admitting: Family Medicine

## 2018-02-19 ENCOUNTER — Ambulatory Visit (INDEPENDENT_AMBULATORY_CARE_PROVIDER_SITE_OTHER)
Admission: RE | Admit: 2018-02-19 | Discharge: 2018-02-19 | Disposition: A | Discharge status: Home / Self Care | Payer: Medicare Other | Source: Ambulatory Visit | Attending: Family Medicine | Admitting: Family Medicine

## 2018-02-19 ENCOUNTER — Encounter: Payer: Self-pay | Admitting: Family Medicine

## 2018-02-19 ENCOUNTER — Ambulatory Visit (INDEPENDENT_AMBULATORY_CARE_PROVIDER_SITE_OTHER): Admitting: Family Medicine

## 2018-02-19 VITALS — BP 170/78 | HR 77 | Temp 97.4°F | Ht 65.0 in

## 2018-02-19 DIAGNOSIS — M25561 Pain in right knee: Secondary | ICD-10-CM

## 2018-02-19 DIAGNOSIS — I5033 Acute on chronic diastolic (congestive) heart failure: Secondary | ICD-10-CM | POA: Diagnosis not present

## 2018-02-19 DIAGNOSIS — I25118 Atherosclerotic heart disease of native coronary artery with other forms of angina pectoris: Secondary | ICD-10-CM | POA: Diagnosis not present

## 2018-02-19 DIAGNOSIS — I480 Paroxysmal atrial fibrillation: Secondary | ICD-10-CM | POA: Diagnosis not present

## 2018-02-19 DIAGNOSIS — Z8673 Personal history of transient ischemic attack (TIA), and cerebral infarction without residual deficits: Secondary | ICD-10-CM | POA: Diagnosis not present

## 2018-02-19 DIAGNOSIS — I110 Hypertensive heart disease with heart failure: Secondary | ICD-10-CM | POA: Diagnosis not present

## 2018-02-19 DIAGNOSIS — Z951 Presence of aortocoronary bypass graft: Secondary | ICD-10-CM | POA: Diagnosis not present

## 2018-02-19 NOTE — Patient Instructions (Addendum)
Continue knee elevation with intermittent ice and heat  Keep a pillow under it   Ibuprofen- 2 pills up to three times daily with food  You you can also give tylenol (acetaminophen) 2 pills up to every 4-6 hours   Xray now  I will have a report today or tomorrow and will contact you

## 2018-02-19 NOTE — Assessment & Plan Note (Signed)
Based on exam and hx -suspect OA Xray of knee pending now  Using ibuprofen tid (aware of risk of CHF with this) Can add 650 mg of tylenol every 4-6 h if needed  Elevate Ice/heat and support  Further plan to follow

## 2018-02-19 NOTE — Progress Notes (Signed)
Subjective:    Patient ID: Jill Howell, female    DOB: [birth year], 84 y.o.   MRN: [id number]  HPI Here for R knee pain  1-2 weeks  No trauma or falls (had 24 h care)  Points to medial knee - re where pain is now  Does not look swollen   Has arthritis  ? If knees  Hurts to move it  Can bear weight on it- limps a little just starting today   No fever or illness that we know of     Wt Readings from Last 3 Encounters:  11/17/17 126 lb 4 oz (57.3 kg)  10/09/17 130 lb (59 kg)  10/06/17 130 lb 2 oz (59 kg)   21.01 kg/m   84 yo pt of Dr Damita Dunnings with hx of CHF and CAD and carotid dz with HTN as well as dementia  Is a hospice patient    BP is high today -- thinks this is for pain  BP Readings from Last 3 Encounters:  02/19/18 (!) 170/78  11/17/17 130/60  10/09/17 (!) 122/50   Patient Active Problem List   Diagnosis Date Noted  . Right knee pain 02/19/2018  . Hospice care patient 10/18/2017  . Skin lesion 10/10/2017  . Acute on chronic diastolic heart failure ([place]) 10/02/2017  . HTN (hypertension) 09/16/2017  . Malnutrition of moderate degree 09/05/2017  . Encounter for hospice care discussion   . DNR (do not resuscitate)   . Palliative care encounter   . Advance care planning 01/08/2017  . Cough 04/28/2016  . Hyperkalemia 04/22/2016  . Pressure ulcer 02/20/2016  . Atherosclerosis of native coronary artery of native heart with stable angina pectoris ([place]) 02/19/2016  . Dementia 02/19/2016  . Hyponatremia 02/19/2016  . Chronic diastolic congestive heart failure ([place]) 02/19/2016  . Carotid stenosis 06/09/2014  . Encounter for staple removal 06/09/2014  . Asthmatic bronchitis , chronic ([place]) 03/24/2014  . Family discord 02/18/2014  . Malaise and fatigue 12/01/2013  . At risk for falls 08/12/2013  . Anemia   . TIA (transient ischemic attack) 05/17/2013  . Occlusion and stenosis of carotid artery without mention of cerebral infarction 05/13/2013  . Fall at home  05/11/2013  . PAF (paroxysmal atrial fibrillation) ([place]) 08/11/2012  . Carotid artery disease ([place]) 08/19/2011  . Dermatitis 05/09/2011  . Hypothyroidism 09/15/2007  . Hyperlipidemia 09/15/2007  . HEARING IMPAIRMENT 09/15/2007  . Coronary atherosclerosis 09/15/2007  . ALLERGIC RHINITIS 09/15/2007  . GERD 09/15/2007   Past Medical History:  Diagnosis Date  . Allergic rhinitis, cause unspecified   . Anemia   . Asthma   . Atrial fibrillation ([place])    a. remote->no anticoagulation 2/2 h/o falls.  [name] CAD (coronary artery disease)    a. 2000 s/p MI/CABG x 4 (VG-PDA, VG-OM, VG-Diag, LIMA-LAD);  b. 2011 MI;  c. 08/2013 NSTEMI/Cath: LM 90-95, LAD 90/100p, LCX 99p, 164m, RCA 100ost, VG-PDA nl, VG-OM 100 (culprit w/ thrombus), VG-Diag nl, LIMA-LAD nl, EF 55-60%, mild inf HK, mod MR-->Med Rx.  . Carotid arterial disease ([place])    a. 05/2013 s/p L CEA.  . Chronic diastolic CHF (congestive heart failure) ([place])    a. 08/2013 Echo: EF 50-55%, no rwma, Gr2 DD, mod MR, mildly dil LA, PASP 14mmHg.  [name] Dementia   . Esophageal reflux   . History of TIA (transient ischemic attack)   . Hyperlipidemia   . Hypertension   . Hypothyroidism   . Myocardial infarction ([place])    08-2013  .  Type II or unspecified type diabetes mellitus without mention of complication, not stated as uncontrolled   . Unspecified hearing loss    Past Surgical History:  Procedure Laterality Date  . ABDOMINAL HYSTERECTOMY    . Bladder tack     x 2  . CARDIAC CATHETERIZATION  09/07/1999   EF 25%. THERE IS SEVERE MITRAL ANNULAR CALIFICATION, WITH  3 + INSUFFICIENCY  . CARDIOVASCULAR STRESS TEST  06/30/2007   EF 63%, NO ISCHEMIA  . CAROTID ENDARTERECTOMY Left 05-19-13   cea  . CHOLECYSTECTOMY  10/2001  . CORONARY ARTERY BYPASS GRAFT  2000   4 vessel  . DILATION AND CURETTAGE OF UTERUS    . ENDARTERECTOMY Left 05/19/2013   Procedure: ENDARTERECTOMY CAROTID-LEFT;  Surgeon: Elam Dutch, MD;  Location: [facility];  Service: Vascular;   Laterality: Left;  . EYE SURGERY Bilateral    Cataract  . HEMORRHOID SURGERY    . LEFT HEART CATH  Dec. 2, 2014   With  Coronary Graft  Angiogram  . LEFT HEART CATHETERIZATION WITH CORONARY/GRAFT ANGIOGRAM  08/10/2013   Procedure: LEFT HEART CATHETERIZATION WITH Beatrix Fetters;  Surgeon: Peter M Martinique, MD;  Location: [facility] CATH LAB;  Service: Cardiovascular;;  . PARTIAL HYSTERECTOMY    . RECTOCELE REPAIR     x 2  . ROTATOR CUFF REPAIR    . TONSILLECTOMY    . US ECHOCARDIOGRAPHY  01/17/2005   EF 55-60%   Social History   Tobacco Use  . Smoking status: Never Smoker  . Smokeless tobacco: Never Used  Substance Use Topics  . Alcohol use: No    Alcohol/week: 0.0 oz  . Drug use: No   Family History  Problem Relation Age of Onset  . Heart attack Father        X's 3  . Heart disease Father   . Heart disease Son        before age 89  . Cancer Brother   . Hypertension Mother   . Alzheimer's disease Mother    Allergies  Allergen Reactions  . Penicillins Other (See Comments)    unknown   Current Outpatient Medications on File Prior to Visit  Medication Sig Dispense Refill  . aspirin 81 MG EC tablet Take 81 mg by mouth daily.      [name] atenolol (TENORMIN) 25 MG tablet Take 1 tablet (25 mg total) by mouth daily. 90 tablet 3  . BIOTIN PO Take 1 capsule by mouth daily.     [name] DIGOX 125 MCG tablet Take 1 tablet by mouth daily.    . furosemide (LASIX) 40 MG tablet Take 1 tab every 2-3 days when weight increases.  Take with potassium.    . isosorbide mononitrate (IMDUR) 30 MG 24 hr tablet Take 30 mg by mouth daily.    [name] levothyroxine (SYNTHROID, LEVOTHROID) 100 MCG tablet TAKE ONE TABLET EVERY DAY 90 tablet 3  . losartan (COZAAR) 25 MG tablet TAKE ONE TABLET EVERY DAY 30 tablet 3  . Multiple Vitamins-Minerals (PRESERVISION AREDS PO) Take 1 tablet by mouth daily.    . nitroGLYCERIN (NITROSTAT) 0.4 MG SL tablet PLACE 1 TABLET UNDER THE TONGUE EVERY 5 MINUTES AS NEEDED FOR CHEST  PAIN. 100 tablet PRN  . pantoprazole (PROTONIX) 40 MG tablet Take 1 tablet (40 mg total) by mouth daily.    . potassium chloride SA (K-DUR,KLOR-CON) 20 MEQ tablet Take 1 tab every 2-3 days when weight increases.  Take with furosemide.    [name] QUEtiapine (SEROQUEL) 25  MG tablet TAKE 1 TABLET BY MOUTH AT BEDTIME 30 tablet 1  . simvastatin (ZOCOR) 40 MG tablet TAKE ONE TABLET EVERY DAY 30 tablet 5  . simvastatin (ZOCOR) 40 MG tablet TAKE ONE TABLET EVERY DAY 30 tablet 5  . vitamin B-12 (CYANOCOBALAMIN) 1000 MCG tablet Take 1,000 mcg by mouth daily.      No current facility-administered medications on file prior to visit.     Review of Systems  Constitutional: Positive for fatigue. Negative for activity change, appetite change, fever and unexpected weight change.  HENT: Negative for congestion, ear pain, rhinorrhea, sinus pressure and sore throat.   Eyes: Negative for pain, redness and visual disturbance.  Respiratory: Negative for cough, shortness of breath and wheezing.   Cardiovascular: Negative for chest pain and palpitations.  Gastrointestinal: Negative for abdominal pain, blood in stool, constipation and diarrhea.  Endocrine: Negative for polydipsia and polyuria.  Genitourinary: Negative for dysuria, frequency and urgency.  Musculoskeletal: Positive for arthralgias. Negative for back pain and myalgias.       Right knee pain   Skin: Negative for pallor and rash.  Allergic/Immunologic: Negative for environmental allergies.  Neurological: Negative for dizziness, syncope and headaches.  Hematological: Negative for adenopathy. Does not bruise/bleed easily.  Psychiatric/Behavioral: Positive for confusion. Negative for decreased concentration and dysphoric mood. The patient is not nervous/anxious.        Objective:   Physical Exam  Constitutional: She is oriented to person, place, and time. She appears well-developed and well-nourished. No distress.  Elderly female with dementia sitting in a  wheel chair  HENT:  Head: Normocephalic and atraumatic.  Sore noted on R upper lip  Eyes: Pupils are equal, round, and reactive to light. Conjunctivae and EOM are normal. Right eye exhibits no discharge. No scleral icterus.  Neck: Normal range of motion. Neck supple.  Cardiovascular: Normal rate and intact distal pulses.  Musculoskeletal: She exhibits tenderness. She exhibits no edema or deformity.  Medial tenderness of R knee and proximal tibia  No deformity or sign of trauma  No erythema or warmth  Cannot r/o small effusion  slt apprehension to extend fully / nl flexion however  No crepitus  Did not witness gait today  Stable on drawer/lachman exam  No posterior tenderness or fullness  Nl sens and perf of feet  Lymphadenopathy:    She has no cervical adenopathy.  Neurological: She is alert and oriented to person, place, and time. No cranial nerve deficit or sensory deficit. She exhibits normal muscle tone.  Skin: Skin is warm and dry. No rash noted. No erythema. No pallor.  Psychiatric:  Quiet with dementia Will occ answer a question  Not agitated or in distress          Assessment & Plan:   Problem List Items Addressed This Visit      Other   Right knee pain - Primary    Based on exam and hx -suspect OA Xray of knee pending now  Using ibuprofen tid (aware of risk of CHF with this) Can add 650 mg of tylenol every 4-6 h if needed  Elevate Ice/heat and support  Further plan to follow

## 2018-02-21 DIAGNOSIS — I110 Hypertensive heart disease with heart failure: Secondary | ICD-10-CM | POA: Diagnosis not present

## 2018-02-21 DIAGNOSIS — I5033 Acute on chronic diastolic (congestive) heart failure: Secondary | ICD-10-CM | POA: Diagnosis not present

## 2018-02-21 DIAGNOSIS — Z8673 Personal history of transient ischemic attack (TIA), and cerebral infarction without residual deficits: Secondary | ICD-10-CM | POA: Diagnosis not present

## 2018-02-21 DIAGNOSIS — Z951 Presence of aortocoronary bypass graft: Secondary | ICD-10-CM | POA: Diagnosis not present

## 2018-02-21 DIAGNOSIS — I480 Paroxysmal atrial fibrillation: Secondary | ICD-10-CM | POA: Diagnosis not present

## 2018-02-21 DIAGNOSIS — I25118 Atherosclerotic heart disease of native coronary artery with other forms of angina pectoris: Secondary | ICD-10-CM | POA: Diagnosis not present

## 2018-02-22 DIAGNOSIS — Z8673 Personal history of transient ischemic attack (TIA), and cerebral infarction without residual deficits: Secondary | ICD-10-CM | POA: Diagnosis not present

## 2018-02-22 DIAGNOSIS — I110 Hypertensive heart disease with heart failure: Secondary | ICD-10-CM | POA: Diagnosis not present

## 2018-02-22 DIAGNOSIS — I480 Paroxysmal atrial fibrillation: Secondary | ICD-10-CM | POA: Diagnosis not present

## 2018-02-22 DIAGNOSIS — I25118 Atherosclerotic heart disease of native coronary artery with other forms of angina pectoris: Secondary | ICD-10-CM | POA: Diagnosis not present

## 2018-02-22 DIAGNOSIS — I5033 Acute on chronic diastolic (congestive) heart failure: Secondary | ICD-10-CM | POA: Diagnosis not present

## 2018-02-22 DIAGNOSIS — Z951 Presence of aortocoronary bypass graft: Secondary | ICD-10-CM | POA: Diagnosis not present

## 2018-02-23 ENCOUNTER — Telehealth: Payer: Self-pay

## 2018-02-23 ENCOUNTER — Encounter: Payer: Self-pay | Admitting: *Deleted

## 2018-02-23 DIAGNOSIS — I5033 Acute on chronic diastolic (congestive) heart failure: Secondary | ICD-10-CM | POA: Diagnosis not present

## 2018-02-23 DIAGNOSIS — Z8673 Personal history of transient ischemic attack (TIA), and cerebral infarction without residual deficits: Secondary | ICD-10-CM | POA: Diagnosis not present

## 2018-02-23 DIAGNOSIS — I25118 Atherosclerotic heart disease of native coronary artery with other forms of angina pectoris: Secondary | ICD-10-CM | POA: Diagnosis not present

## 2018-02-23 DIAGNOSIS — Z951 Presence of aortocoronary bypass graft: Secondary | ICD-10-CM | POA: Diagnosis not present

## 2018-02-23 DIAGNOSIS — I110 Hypertensive heart disease with heart failure: Secondary | ICD-10-CM | POA: Diagnosis not present

## 2018-02-23 DIAGNOSIS — I480 Paroxysmal atrial fibrillation: Secondary | ICD-10-CM | POA: Diagnosis not present

## 2018-02-23 NOTE — Telephone Encounter (Signed)
PLEASE NOTE: All timestamps contained within this report are represented as [place] Standard Time. CONFIDENTIALTY NOTICE: This fax transmission is intended only for the addressee. It contains information that is legally privileged, confidential or otherwise protected from use or disclosure. If you are not the intended recipient, you are strictly prohibited from reviewing, disclosing, copying using or disseminating any of this information or taking any action in reliance on or regarding this information. If you have received this fax in error, please notify us immediately by telephone so that we can arrange for its return to [place]. 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

## 2018-02-23 NOTE — Telephone Encounter (Signed)
Please get update from Hospice.  I thank all involved.

## 2018-02-23 NOTE — Telephone Encounter (Signed)
Patient's son came by and reported that he was trying to get the x-ray report from last week but over the weekend, they think the patient Jill Howell have had a stroke.  She is barely eating anything, can't walk or move her arms very much.  Hospice is involved in her care.

## 2018-02-23 NOTE — Telephone Encounter (Signed)
Left detailed message on voicemail asking for an update on patient and a summary of what was done for her.

## 2018-02-23 NOTE — Telephone Encounter (Signed)
What was done for her knee pain?  I can't tell from the EMR.  How is the patient? Let me know.  Thanks.

## 2018-02-24 DIAGNOSIS — Z951 Presence of aortocoronary bypass graft: Secondary | ICD-10-CM | POA: Diagnosis not present

## 2018-02-24 DIAGNOSIS — I480 Paroxysmal atrial fibrillation: Secondary | ICD-10-CM | POA: Diagnosis not present

## 2018-02-24 DIAGNOSIS — I110 Hypertensive heart disease with heart failure: Secondary | ICD-10-CM | POA: Diagnosis not present

## 2018-02-24 DIAGNOSIS — I5033 Acute on chronic diastolic (congestive) heart failure: Secondary | ICD-10-CM | POA: Diagnosis not present

## 2018-02-24 DIAGNOSIS — Z8673 Personal history of transient ischemic attack (TIA), and cerebral infarction without residual deficits: Secondary | ICD-10-CM | POA: Diagnosis not present

## 2018-02-24 DIAGNOSIS — I25118 Atherosclerotic heart disease of native coronary artery with other forms of angina pectoris: Secondary | ICD-10-CM | POA: Diagnosis not present

## 2018-02-24 NOTE — Telephone Encounter (Signed)
Phoned again to Hospice and was told that this patient's nurse is on vacation this week and the nurse that saw the patient today is still out doing visits but will call as soon as she finishes.

## 2018-02-24 NOTE — Telephone Encounter (Signed)
Left message with Hospice to call for patient's condition update.

## 2018-02-25 ENCOUNTER — Telehealth: Payer: Self-pay | Admitting: *Deleted

## 2018-02-25 NOTE — Telephone Encounter (Signed)
Spoke to [facility] Nurse) and was advised that patient was dong some better Tuesday when she saw her. Peggy stated that she was able to eat and take her medicine. Peggy stated that she was able to get the patient to sit on the side of the bed and transfer to a chair. Peggy stated that patient was able to take one to two steps, but not able to walk across the room.  Peggy stated that she watched the patient eat and take her medication. Vickii Chafe stated that the family had been crushing her medication and she advised them they should not do that because some of it is timed release. Vickii Chafe stated that the family talked with the triage nurse today and they told her that they were able to get patient in the recliner.

## 2018-02-25 NOTE — Telephone Encounter (Signed)
Fax received from [facility] that patient needs PA for diclofenac sodium 1% gel.  PA initiated by pharmacy on CoverMyMeds, key: [id number], but needs to be completed.  This is not on patient's current med list and I do not see that Dr. Damita Dunnings has ever prescribed this for patient.  Will defer to PCP office to determine if med/PA is needed.

## 2018-02-26 ENCOUNTER — Other Ambulatory Visit: Payer: Self-pay | Admitting: *Deleted

## 2018-02-26 DIAGNOSIS — I480 Paroxysmal atrial fibrillation: Secondary | ICD-10-CM | POA: Diagnosis not present

## 2018-02-26 DIAGNOSIS — I110 Hypertensive heart disease with heart failure: Secondary | ICD-10-CM | POA: Diagnosis not present

## 2018-02-26 DIAGNOSIS — Z8673 Personal history of transient ischemic attack (TIA), and cerebral infarction without residual deficits: Secondary | ICD-10-CM | POA: Diagnosis not present

## 2018-02-26 DIAGNOSIS — I25118 Atherosclerotic heart disease of native coronary artery with other forms of angina pectoris: Secondary | ICD-10-CM | POA: Diagnosis not present

## 2018-02-26 DIAGNOSIS — Z951 Presence of aortocoronary bypass graft: Secondary | ICD-10-CM | POA: Diagnosis not present

## 2018-02-26 DIAGNOSIS — I5033 Acute on chronic diastolic (congestive) heart failure: Secondary | ICD-10-CM | POA: Diagnosis not present

## 2018-02-26 NOTE — Telephone Encounter (Addendum)
I am thinking this [place] have come from Hospice and if so, they should take care of it.  I started the PA but it began asking Hospice MD name, phone number and fax number that I don't have access to and I feel they have likely taken care of it.

## 2018-02-26 NOTE — Telephone Encounter (Signed)
Noted.  I'll defer for now and await further update.  Thanks.

## 2018-02-26 NOTE — Telephone Encounter (Signed)
Please verify this with the family.  I will see with the patient ever had this prescription.  Thanks.

## 2018-02-27 NOTE — Telephone Encounter (Signed)
Caregiver says the patient has the medication.

## 2018-03-02 DIAGNOSIS — I110 Hypertensive heart disease with heart failure: Secondary | ICD-10-CM | POA: Diagnosis not present

## 2018-03-02 DIAGNOSIS — I5033 Acute on chronic diastolic (congestive) heart failure: Secondary | ICD-10-CM | POA: Diagnosis not present

## 2018-03-02 DIAGNOSIS — I25118 Atherosclerotic heart disease of native coronary artery with other forms of angina pectoris: Secondary | ICD-10-CM | POA: Diagnosis not present

## 2018-03-02 DIAGNOSIS — I480 Paroxysmal atrial fibrillation: Secondary | ICD-10-CM | POA: Diagnosis not present

## 2018-03-02 DIAGNOSIS — Z951 Presence of aortocoronary bypass graft: Secondary | ICD-10-CM | POA: Diagnosis not present

## 2018-03-02 DIAGNOSIS — Z8673 Personal history of transient ischemic attack (TIA), and cerebral infarction without residual deficits: Secondary | ICD-10-CM | POA: Diagnosis not present

## 2018-03-03 DIAGNOSIS — I480 Paroxysmal atrial fibrillation: Secondary | ICD-10-CM | POA: Diagnosis not present

## 2018-03-03 DIAGNOSIS — I25118 Atherosclerotic heart disease of native coronary artery with other forms of angina pectoris: Secondary | ICD-10-CM | POA: Diagnosis not present

## 2018-03-03 DIAGNOSIS — I5033 Acute on chronic diastolic (congestive) heart failure: Secondary | ICD-10-CM | POA: Diagnosis not present

## 2018-03-03 DIAGNOSIS — Z8673 Personal history of transient ischemic attack (TIA), and cerebral infarction without residual deficits: Secondary | ICD-10-CM | POA: Diagnosis not present

## 2018-03-03 DIAGNOSIS — I110 Hypertensive heart disease with heart failure: Secondary | ICD-10-CM | POA: Diagnosis not present

## 2018-03-03 DIAGNOSIS — Z951 Presence of aortocoronary bypass graft: Secondary | ICD-10-CM | POA: Diagnosis not present

## 2018-03-04 DIAGNOSIS — I25118 Atherosclerotic heart disease of native coronary artery with other forms of angina pectoris: Secondary | ICD-10-CM | POA: Diagnosis not present

## 2018-03-04 DIAGNOSIS — Z951 Presence of aortocoronary bypass graft: Secondary | ICD-10-CM | POA: Diagnosis not present

## 2018-03-04 DIAGNOSIS — I110 Hypertensive heart disease with heart failure: Secondary | ICD-10-CM | POA: Diagnosis not present

## 2018-03-04 DIAGNOSIS — Z8673 Personal history of transient ischemic attack (TIA), and cerebral infarction without residual deficits: Secondary | ICD-10-CM | POA: Diagnosis not present

## 2018-03-04 DIAGNOSIS — I5033 Acute on chronic diastolic (congestive) heart failure: Secondary | ICD-10-CM | POA: Diagnosis not present

## 2018-03-04 DIAGNOSIS — I480 Paroxysmal atrial fibrillation: Secondary | ICD-10-CM | POA: Diagnosis not present

## 2018-03-05 DIAGNOSIS — I480 Paroxysmal atrial fibrillation: Secondary | ICD-10-CM | POA: Diagnosis not present

## 2018-03-05 DIAGNOSIS — Z951 Presence of aortocoronary bypass graft: Secondary | ICD-10-CM | POA: Diagnosis not present

## 2018-03-05 DIAGNOSIS — I110 Hypertensive heart disease with heart failure: Secondary | ICD-10-CM | POA: Diagnosis not present

## 2018-03-05 DIAGNOSIS — I25118 Atherosclerotic heart disease of native coronary artery with other forms of angina pectoris: Secondary | ICD-10-CM | POA: Diagnosis not present

## 2018-03-05 DIAGNOSIS — Z8673 Personal history of transient ischemic attack (TIA), and cerebral infarction without residual deficits: Secondary | ICD-10-CM | POA: Diagnosis not present

## 2018-03-05 DIAGNOSIS — I5033 Acute on chronic diastolic (congestive) heart failure: Secondary | ICD-10-CM | POA: Diagnosis not present

## 2018-03-09 DIAGNOSIS — R32 Unspecified urinary incontinence: Secondary | ICD-10-CM | POA: Diagnosis not present

## 2018-03-09 DIAGNOSIS — E785 Hyperlipidemia, unspecified: Secondary | ICD-10-CM | POA: Diagnosis not present

## 2018-03-09 DIAGNOSIS — L89322 Pressure ulcer of left buttock, stage 2: Secondary | ICD-10-CM | POA: Diagnosis not present

## 2018-03-09 DIAGNOSIS — D649 Anemia, unspecified: Secondary | ICD-10-CM | POA: Diagnosis not present

## 2018-03-09 DIAGNOSIS — E039 Hypothyroidism, unspecified: Secondary | ICD-10-CM | POA: Diagnosis not present

## 2018-03-09 DIAGNOSIS — R159 Full incontinence of feces: Secondary | ICD-10-CM | POA: Diagnosis not present

## 2018-03-09 DIAGNOSIS — I110 Hypertensive heart disease with heart failure: Secondary | ICD-10-CM | POA: Diagnosis not present

## 2018-03-09 DIAGNOSIS — Z466 Encounter for fitting and adjustment of urinary device: Secondary | ICD-10-CM | POA: Diagnosis not present

## 2018-03-09 DIAGNOSIS — K219 Gastro-esophageal reflux disease without esophagitis: Secondary | ICD-10-CM | POA: Diagnosis not present

## 2018-03-09 DIAGNOSIS — I5033 Acute on chronic diastolic (congestive) heart failure: Secondary | ICD-10-CM | POA: Diagnosis not present

## 2018-03-09 DIAGNOSIS — Z8673 Personal history of transient ischemic attack (TIA), and cerebral infarction without residual deficits: Secondary | ICD-10-CM | POA: Diagnosis not present

## 2018-03-09 DIAGNOSIS — I480 Paroxysmal atrial fibrillation: Secondary | ICD-10-CM | POA: Diagnosis not present

## 2018-03-09 DIAGNOSIS — Z951 Presence of aortocoronary bypass graft: Secondary | ICD-10-CM | POA: Diagnosis not present

## 2018-03-09 DIAGNOSIS — Z9981 Dependence on supplemental oxygen: Secondary | ICD-10-CM | POA: Diagnosis not present

## 2018-03-09 DIAGNOSIS — R627 Adult failure to thrive: Secondary | ICD-10-CM | POA: Diagnosis not present

## 2018-03-09 DIAGNOSIS — Z7401 Bed confinement status: Secondary | ICD-10-CM | POA: Diagnosis not present

## 2018-03-09 DIAGNOSIS — L89312 Pressure ulcer of right buttock, stage 2: Secondary | ICD-10-CM | POA: Diagnosis not present

## 2018-03-09 DIAGNOSIS — I25118 Atherosclerotic heart disease of native coronary artery with other forms of angina pectoris: Secondary | ICD-10-CM | POA: Diagnosis not present

## 2018-03-11 DIAGNOSIS — Z8673 Personal history of transient ischemic attack (TIA), and cerebral infarction without residual deficits: Secondary | ICD-10-CM | POA: Diagnosis not present

## 2018-03-11 DIAGNOSIS — I110 Hypertensive heart disease with heart failure: Secondary | ICD-10-CM | POA: Diagnosis not present

## 2018-03-11 DIAGNOSIS — Z951 Presence of aortocoronary bypass graft: Secondary | ICD-10-CM | POA: Diagnosis not present

## 2018-03-11 DIAGNOSIS — I5033 Acute on chronic diastolic (congestive) heart failure: Secondary | ICD-10-CM | POA: Diagnosis not present

## 2018-03-11 DIAGNOSIS — I480 Paroxysmal atrial fibrillation: Secondary | ICD-10-CM | POA: Diagnosis not present

## 2018-03-11 DIAGNOSIS — I25118 Atherosclerotic heart disease of native coronary artery with other forms of angina pectoris: Secondary | ICD-10-CM | POA: Diagnosis not present

## 2018-03-12 DIAGNOSIS — I5033 Acute on chronic diastolic (congestive) heart failure: Secondary | ICD-10-CM | POA: Diagnosis not present

## 2018-03-12 DIAGNOSIS — I110 Hypertensive heart disease with heart failure: Secondary | ICD-10-CM | POA: Diagnosis not present

## 2018-03-12 DIAGNOSIS — Z951 Presence of aortocoronary bypass graft: Secondary | ICD-10-CM | POA: Diagnosis not present

## 2018-03-12 DIAGNOSIS — I25118 Atherosclerotic heart disease of native coronary artery with other forms of angina pectoris: Secondary | ICD-10-CM | POA: Diagnosis not present

## 2018-03-12 DIAGNOSIS — Z8673 Personal history of transient ischemic attack (TIA), and cerebral infarction without residual deficits: Secondary | ICD-10-CM | POA: Diagnosis not present

## 2018-03-12 DIAGNOSIS — I480 Paroxysmal atrial fibrillation: Secondary | ICD-10-CM | POA: Diagnosis not present

## 2018-03-16 DIAGNOSIS — Z8673 Personal history of transient ischemic attack (TIA), and cerebral infarction without residual deficits: Secondary | ICD-10-CM | POA: Diagnosis not present

## 2018-03-16 DIAGNOSIS — I480 Paroxysmal atrial fibrillation: Secondary | ICD-10-CM | POA: Diagnosis not present

## 2018-03-16 DIAGNOSIS — I5033 Acute on chronic diastolic (congestive) heart failure: Secondary | ICD-10-CM | POA: Diagnosis not present

## 2018-03-16 DIAGNOSIS — Z951 Presence of aortocoronary bypass graft: Secondary | ICD-10-CM | POA: Diagnosis not present

## 2018-03-16 DIAGNOSIS — I25118 Atherosclerotic heart disease of native coronary artery with other forms of angina pectoris: Secondary | ICD-10-CM | POA: Diagnosis not present

## 2018-03-16 DIAGNOSIS — I110 Hypertensive heart disease with heart failure: Secondary | ICD-10-CM | POA: Diagnosis not present

## 2018-03-17 DIAGNOSIS — I110 Hypertensive heart disease with heart failure: Secondary | ICD-10-CM | POA: Diagnosis not present

## 2018-03-17 DIAGNOSIS — Z951 Presence of aortocoronary bypass graft: Secondary | ICD-10-CM | POA: Diagnosis not present

## 2018-03-17 DIAGNOSIS — I480 Paroxysmal atrial fibrillation: Secondary | ICD-10-CM | POA: Diagnosis not present

## 2018-03-17 DIAGNOSIS — Z8673 Personal history of transient ischemic attack (TIA), and cerebral infarction without residual deficits: Secondary | ICD-10-CM | POA: Diagnosis not present

## 2018-03-17 DIAGNOSIS — I25118 Atherosclerotic heart disease of native coronary artery with other forms of angina pectoris: Secondary | ICD-10-CM | POA: Diagnosis not present

## 2018-03-17 DIAGNOSIS — I5033 Acute on chronic diastolic (congestive) heart failure: Secondary | ICD-10-CM | POA: Diagnosis not present

## 2018-03-18 DIAGNOSIS — I110 Hypertensive heart disease with heart failure: Secondary | ICD-10-CM | POA: Diagnosis not present

## 2018-03-18 DIAGNOSIS — I25118 Atherosclerotic heart disease of native coronary artery with other forms of angina pectoris: Secondary | ICD-10-CM | POA: Diagnosis not present

## 2018-03-18 DIAGNOSIS — Z951 Presence of aortocoronary bypass graft: Secondary | ICD-10-CM | POA: Diagnosis not present

## 2018-03-18 DIAGNOSIS — Z8673 Personal history of transient ischemic attack (TIA), and cerebral infarction without residual deficits: Secondary | ICD-10-CM | POA: Diagnosis not present

## 2018-03-18 DIAGNOSIS — I5033 Acute on chronic diastolic (congestive) heart failure: Secondary | ICD-10-CM | POA: Diagnosis not present

## 2018-03-18 DIAGNOSIS — I480 Paroxysmal atrial fibrillation: Secondary | ICD-10-CM | POA: Diagnosis not present

## 2018-03-19 DIAGNOSIS — I25118 Atherosclerotic heart disease of native coronary artery with other forms of angina pectoris: Secondary | ICD-10-CM | POA: Diagnosis not present

## 2018-03-19 DIAGNOSIS — I480 Paroxysmal atrial fibrillation: Secondary | ICD-10-CM | POA: Diagnosis not present

## 2018-03-19 DIAGNOSIS — I110 Hypertensive heart disease with heart failure: Secondary | ICD-10-CM | POA: Diagnosis not present

## 2018-03-19 DIAGNOSIS — Z951 Presence of aortocoronary bypass graft: Secondary | ICD-10-CM | POA: Diagnosis not present

## 2018-03-19 DIAGNOSIS — Z8673 Personal history of transient ischemic attack (TIA), and cerebral infarction without residual deficits: Secondary | ICD-10-CM | POA: Diagnosis not present

## 2018-03-19 DIAGNOSIS — I5033 Acute on chronic diastolic (congestive) heart failure: Secondary | ICD-10-CM | POA: Diagnosis not present

## 2018-03-23 DIAGNOSIS — Z951 Presence of aortocoronary bypass graft: Secondary | ICD-10-CM | POA: Diagnosis not present

## 2018-03-23 DIAGNOSIS — Z8673 Personal history of transient ischemic attack (TIA), and cerebral infarction without residual deficits: Secondary | ICD-10-CM | POA: Diagnosis not present

## 2018-03-23 DIAGNOSIS — I25118 Atherosclerotic heart disease of native coronary artery with other forms of angina pectoris: Secondary | ICD-10-CM | POA: Diagnosis not present

## 2018-03-23 DIAGNOSIS — I110 Hypertensive heart disease with heart failure: Secondary | ICD-10-CM | POA: Diagnosis not present

## 2018-03-23 DIAGNOSIS — I5033 Acute on chronic diastolic (congestive) heart failure: Secondary | ICD-10-CM | POA: Diagnosis not present

## 2018-03-23 DIAGNOSIS — I480 Paroxysmal atrial fibrillation: Secondary | ICD-10-CM | POA: Diagnosis not present

## 2018-03-25 DIAGNOSIS — I480 Paroxysmal atrial fibrillation: Secondary | ICD-10-CM | POA: Diagnosis not present

## 2018-03-25 DIAGNOSIS — I25118 Atherosclerotic heart disease of native coronary artery with other forms of angina pectoris: Secondary | ICD-10-CM | POA: Diagnosis not present

## 2018-03-25 DIAGNOSIS — I5033 Acute on chronic diastolic (congestive) heart failure: Secondary | ICD-10-CM | POA: Diagnosis not present

## 2018-03-25 DIAGNOSIS — Z951 Presence of aortocoronary bypass graft: Secondary | ICD-10-CM | POA: Diagnosis not present

## 2018-03-25 DIAGNOSIS — Z8673 Personal history of transient ischemic attack (TIA), and cerebral infarction without residual deficits: Secondary | ICD-10-CM | POA: Diagnosis not present

## 2018-03-25 DIAGNOSIS — I110 Hypertensive heart disease with heart failure: Secondary | ICD-10-CM | POA: Diagnosis not present

## 2018-03-26 DIAGNOSIS — I5033 Acute on chronic diastolic (congestive) heart failure: Secondary | ICD-10-CM | POA: Diagnosis not present

## 2018-03-26 DIAGNOSIS — I25118 Atherosclerotic heart disease of native coronary artery with other forms of angina pectoris: Secondary | ICD-10-CM | POA: Diagnosis not present

## 2018-03-26 DIAGNOSIS — I480 Paroxysmal atrial fibrillation: Secondary | ICD-10-CM | POA: Diagnosis not present

## 2018-03-26 DIAGNOSIS — Z951 Presence of aortocoronary bypass graft: Secondary | ICD-10-CM | POA: Diagnosis not present

## 2018-03-26 DIAGNOSIS — Z8673 Personal history of transient ischemic attack (TIA), and cerebral infarction without residual deficits: Secondary | ICD-10-CM | POA: Diagnosis not present

## 2018-03-26 DIAGNOSIS — I110 Hypertensive heart disease with heart failure: Secondary | ICD-10-CM | POA: Diagnosis not present

## 2018-03-28 ENCOUNTER — Other Ambulatory Visit: Payer: Self-pay | Admitting: Family Medicine

## 2018-03-28 DIAGNOSIS — I5033 Acute on chronic diastolic (congestive) heart failure: Secondary | ICD-10-CM | POA: Diagnosis not present

## 2018-03-28 DIAGNOSIS — I25118 Atherosclerotic heart disease of native coronary artery with other forms of angina pectoris: Secondary | ICD-10-CM | POA: Diagnosis not present

## 2018-03-28 DIAGNOSIS — Z8673 Personal history of transient ischemic attack (TIA), and cerebral infarction without residual deficits: Secondary | ICD-10-CM | POA: Diagnosis not present

## 2018-03-28 DIAGNOSIS — Z951 Presence of aortocoronary bypass graft: Secondary | ICD-10-CM | POA: Diagnosis not present

## 2018-03-28 DIAGNOSIS — I480 Paroxysmal atrial fibrillation: Secondary | ICD-10-CM | POA: Diagnosis not present

## 2018-03-28 DIAGNOSIS — I110 Hypertensive heart disease with heart failure: Secondary | ICD-10-CM | POA: Diagnosis not present

## 2018-03-30 DIAGNOSIS — Z8673 Personal history of transient ischemic attack (TIA), and cerebral infarction without residual deficits: Secondary | ICD-10-CM | POA: Diagnosis not present

## 2018-03-30 DIAGNOSIS — Z951 Presence of aortocoronary bypass graft: Secondary | ICD-10-CM | POA: Diagnosis not present

## 2018-03-30 DIAGNOSIS — I5033 Acute on chronic diastolic (congestive) heart failure: Secondary | ICD-10-CM | POA: Diagnosis not present

## 2018-03-30 DIAGNOSIS — I25118 Atherosclerotic heart disease of native coronary artery with other forms of angina pectoris: Secondary | ICD-10-CM | POA: Diagnosis not present

## 2018-03-30 DIAGNOSIS — I110 Hypertensive heart disease with heart failure: Secondary | ICD-10-CM | POA: Diagnosis not present

## 2018-03-30 DIAGNOSIS — I480 Paroxysmal atrial fibrillation: Secondary | ICD-10-CM | POA: Diagnosis not present

## 2018-03-30 NOTE — Telephone Encounter (Signed)
Electronic refill requests: Last office visit:   02/19/18 Acute Tower Last Filled:   DOK 100 mg   ? Last Filled:   Imdur Historical - Darylene Price, FNP Last Filled:   Quetiapine 30 tablet 1 02/06/2018  Last Filled:   Potassium  ? Quantity    10/13/2017 Please advise.

## 2018-03-30 NOTE — Telephone Encounter (Signed)
Sent. Thanks.   

## 2018-04-01 ENCOUNTER — Ambulatory Visit: Payer: Self-pay | Admitting: Family

## 2018-04-01 DIAGNOSIS — I480 Paroxysmal atrial fibrillation: Secondary | ICD-10-CM | POA: Diagnosis not present

## 2018-04-01 DIAGNOSIS — Z951 Presence of aortocoronary bypass graft: Secondary | ICD-10-CM | POA: Diagnosis not present

## 2018-04-01 DIAGNOSIS — Z8673 Personal history of transient ischemic attack (TIA), and cerebral infarction without residual deficits: Secondary | ICD-10-CM | POA: Diagnosis not present

## 2018-04-01 DIAGNOSIS — I5033 Acute on chronic diastolic (congestive) heart failure: Secondary | ICD-10-CM | POA: Diagnosis not present

## 2018-04-01 DIAGNOSIS — I25118 Atherosclerotic heart disease of native coronary artery with other forms of angina pectoris: Secondary | ICD-10-CM | POA: Diagnosis not present

## 2018-04-01 DIAGNOSIS — I110 Hypertensive heart disease with heart failure: Secondary | ICD-10-CM | POA: Diagnosis not present

## 2018-04-02 DIAGNOSIS — Z8673 Personal history of transient ischemic attack (TIA), and cerebral infarction without residual deficits: Secondary | ICD-10-CM | POA: Diagnosis not present

## 2018-04-02 DIAGNOSIS — I110 Hypertensive heart disease with heart failure: Secondary | ICD-10-CM | POA: Diagnosis not present

## 2018-04-02 DIAGNOSIS — I25118 Atherosclerotic heart disease of native coronary artery with other forms of angina pectoris: Secondary | ICD-10-CM | POA: Diagnosis not present

## 2018-04-02 DIAGNOSIS — Z951 Presence of aortocoronary bypass graft: Secondary | ICD-10-CM | POA: Diagnosis not present

## 2018-04-02 DIAGNOSIS — I480 Paroxysmal atrial fibrillation: Secondary | ICD-10-CM | POA: Diagnosis not present

## 2018-04-02 DIAGNOSIS — I5033 Acute on chronic diastolic (congestive) heart failure: Secondary | ICD-10-CM | POA: Diagnosis not present

## 2018-04-04 DIAGNOSIS — I5033 Acute on chronic diastolic (congestive) heart failure: Secondary | ICD-10-CM | POA: Diagnosis not present

## 2018-04-04 DIAGNOSIS — Z8673 Personal history of transient ischemic attack (TIA), and cerebral infarction without residual deficits: Secondary | ICD-10-CM | POA: Diagnosis not present

## 2018-04-04 DIAGNOSIS — Z951 Presence of aortocoronary bypass graft: Secondary | ICD-10-CM | POA: Diagnosis not present

## 2018-04-04 DIAGNOSIS — I25118 Atherosclerotic heart disease of native coronary artery with other forms of angina pectoris: Secondary | ICD-10-CM | POA: Diagnosis not present

## 2018-04-04 DIAGNOSIS — I480 Paroxysmal atrial fibrillation: Secondary | ICD-10-CM | POA: Diagnosis not present

## 2018-04-04 DIAGNOSIS — I110 Hypertensive heart disease with heart failure: Secondary | ICD-10-CM | POA: Diagnosis not present

## 2018-04-06 DIAGNOSIS — I5033 Acute on chronic diastolic (congestive) heart failure: Secondary | ICD-10-CM | POA: Diagnosis not present

## 2018-04-06 DIAGNOSIS — I480 Paroxysmal atrial fibrillation: Secondary | ICD-10-CM | POA: Diagnosis not present

## 2018-04-06 DIAGNOSIS — Z951 Presence of aortocoronary bypass graft: Secondary | ICD-10-CM | POA: Diagnosis not present

## 2018-04-06 DIAGNOSIS — I110 Hypertensive heart disease with heart failure: Secondary | ICD-10-CM | POA: Diagnosis not present

## 2018-04-06 DIAGNOSIS — I25118 Atherosclerotic heart disease of native coronary artery with other forms of angina pectoris: Secondary | ICD-10-CM | POA: Diagnosis not present

## 2018-04-06 DIAGNOSIS — Z8673 Personal history of transient ischemic attack (TIA), and cerebral infarction without residual deficits: Secondary | ICD-10-CM | POA: Diagnosis not present

## 2018-04-07 DIAGNOSIS — I25118 Atherosclerotic heart disease of native coronary artery with other forms of angina pectoris: Secondary | ICD-10-CM | POA: Diagnosis not present

## 2018-04-07 DIAGNOSIS — I5033 Acute on chronic diastolic (congestive) heart failure: Secondary | ICD-10-CM | POA: Diagnosis not present

## 2018-04-07 DIAGNOSIS — Z8673 Personal history of transient ischemic attack (TIA), and cerebral infarction without residual deficits: Secondary | ICD-10-CM | POA: Diagnosis not present

## 2018-04-07 DIAGNOSIS — I480 Paroxysmal atrial fibrillation: Secondary | ICD-10-CM | POA: Diagnosis not present

## 2018-04-07 DIAGNOSIS — I110 Hypertensive heart disease with heart failure: Secondary | ICD-10-CM | POA: Diagnosis not present

## 2018-04-07 DIAGNOSIS — Z951 Presence of aortocoronary bypass graft: Secondary | ICD-10-CM | POA: Diagnosis not present

## 2018-04-09 DIAGNOSIS — E039 Hypothyroidism, unspecified: Secondary | ICD-10-CM | POA: Diagnosis not present

## 2018-04-09 DIAGNOSIS — R159 Full incontinence of feces: Secondary | ICD-10-CM | POA: Diagnosis not present

## 2018-04-09 DIAGNOSIS — I5033 Acute on chronic diastolic (congestive) heart failure: Secondary | ICD-10-CM | POA: Diagnosis not present

## 2018-04-09 DIAGNOSIS — Z8673 Personal history of transient ischemic attack (TIA), and cerebral infarction without residual deficits: Secondary | ICD-10-CM | POA: Diagnosis not present

## 2018-04-09 DIAGNOSIS — Z7401 Bed confinement status: Secondary | ICD-10-CM | POA: Diagnosis not present

## 2018-04-09 DIAGNOSIS — R627 Adult failure to thrive: Secondary | ICD-10-CM | POA: Diagnosis not present

## 2018-04-09 DIAGNOSIS — I480 Paroxysmal atrial fibrillation: Secondary | ICD-10-CM | POA: Diagnosis not present

## 2018-04-09 DIAGNOSIS — I25118 Atherosclerotic heart disease of native coronary artery with other forms of angina pectoris: Secondary | ICD-10-CM | POA: Diagnosis not present

## 2018-04-09 DIAGNOSIS — D649 Anemia, unspecified: Secondary | ICD-10-CM | POA: Diagnosis not present

## 2018-04-09 DIAGNOSIS — Z9981 Dependence on supplemental oxygen: Secondary | ICD-10-CM | POA: Diagnosis not present

## 2018-04-09 DIAGNOSIS — R32 Unspecified urinary incontinence: Secondary | ICD-10-CM | POA: Diagnosis not present

## 2018-04-09 DIAGNOSIS — Z951 Presence of aortocoronary bypass graft: Secondary | ICD-10-CM | POA: Diagnosis not present

## 2018-04-09 DIAGNOSIS — I110 Hypertensive heart disease with heart failure: Secondary | ICD-10-CM | POA: Diagnosis not present

## 2018-04-09 DIAGNOSIS — E785 Hyperlipidemia, unspecified: Secondary | ICD-10-CM | POA: Diagnosis not present

## 2018-04-09 DIAGNOSIS — L89322 Pressure ulcer of left buttock, stage 2: Secondary | ICD-10-CM | POA: Diagnosis not present

## 2018-04-09 DIAGNOSIS — L89312 Pressure ulcer of right buttock, stage 2: Secondary | ICD-10-CM | POA: Diagnosis not present

## 2018-04-09 DIAGNOSIS — Z466 Encounter for fitting and adjustment of urinary device: Secondary | ICD-10-CM | POA: Diagnosis not present

## 2018-04-09 DIAGNOSIS — K219 Gastro-esophageal reflux disease without esophagitis: Secondary | ICD-10-CM | POA: Diagnosis not present

## 2018-04-10 ENCOUNTER — Other Ambulatory Visit: Payer: Self-pay | Admitting: Family Medicine

## 2018-04-10 DIAGNOSIS — I5033 Acute on chronic diastolic (congestive) heart failure: Secondary | ICD-10-CM | POA: Diagnosis not present

## 2018-04-10 DIAGNOSIS — I110 Hypertensive heart disease with heart failure: Secondary | ICD-10-CM | POA: Diagnosis not present

## 2018-04-10 DIAGNOSIS — Z951 Presence of aortocoronary bypass graft: Secondary | ICD-10-CM | POA: Diagnosis not present

## 2018-04-10 DIAGNOSIS — Z8673 Personal history of transient ischemic attack (TIA), and cerebral infarction without residual deficits: Secondary | ICD-10-CM | POA: Diagnosis not present

## 2018-04-10 DIAGNOSIS — I25118 Atherosclerotic heart disease of native coronary artery with other forms of angina pectoris: Secondary | ICD-10-CM | POA: Diagnosis not present

## 2018-04-10 DIAGNOSIS — I480 Paroxysmal atrial fibrillation: Secondary | ICD-10-CM | POA: Diagnosis not present

## 2018-04-10 MED ORDER — SULFAMETHOXAZOLE-TRIMETHOPRIM 400-80 MG PO TABS: 1.0000 | ORAL_TABLET | Freq: Two times a day (BID) | ORAL | 0 refills | Status: DC

## 2018-04-10 NOTE — Telephone Encounter (Signed)
Sent rx for septra.   Please give order for ucx.  Please have them change foley if possible.  Dx R30.0.  Thanks.

## 2018-04-10 NOTE — Telephone Encounter (Signed)
Misty with Hospice advised.

## 2018-04-10 NOTE — Telephone Encounter (Signed)
Copied from [place] [id number]. Topic: General - Other >> Apr 10, 2018  2:44 PM Judyann Munson wrote: Reason for CRM:  Misty-from Hospice is calling to request a medication be called in for the Patient UTI, she stated they have a urine sample And the patient is symptomatic. Patient has a foley catheter. Rojelio Brenner is requesting a call back in regards to what medication is called in CB# [phone].  Preferred pharmacy: [place], [place] [phone] (Phone) [phone] (Fax)  Please advise

## 2018-04-13 DIAGNOSIS — I5033 Acute on chronic diastolic (congestive) heart failure: Secondary | ICD-10-CM | POA: Diagnosis not present

## 2018-04-13 DIAGNOSIS — Z8673 Personal history of transient ischemic attack (TIA), and cerebral infarction without residual deficits: Secondary | ICD-10-CM | POA: Diagnosis not present

## 2018-04-13 DIAGNOSIS — I480 Paroxysmal atrial fibrillation: Secondary | ICD-10-CM | POA: Diagnosis not present

## 2018-04-13 DIAGNOSIS — Z951 Presence of aortocoronary bypass graft: Secondary | ICD-10-CM | POA: Diagnosis not present

## 2018-04-13 DIAGNOSIS — I110 Hypertensive heart disease with heart failure: Secondary | ICD-10-CM | POA: Diagnosis not present

## 2018-04-13 DIAGNOSIS — I25118 Atherosclerotic heart disease of native coronary artery with other forms of angina pectoris: Secondary | ICD-10-CM | POA: Diagnosis not present

## 2018-04-14 DIAGNOSIS — I5033 Acute on chronic diastolic (congestive) heart failure: Secondary | ICD-10-CM | POA: Diagnosis not present

## 2018-04-14 DIAGNOSIS — I110 Hypertensive heart disease with heart failure: Secondary | ICD-10-CM | POA: Diagnosis not present

## 2018-04-14 DIAGNOSIS — I480 Paroxysmal atrial fibrillation: Secondary | ICD-10-CM | POA: Diagnosis not present

## 2018-04-14 DIAGNOSIS — Z8673 Personal history of transient ischemic attack (TIA), and cerebral infarction without residual deficits: Secondary | ICD-10-CM | POA: Diagnosis not present

## 2018-04-14 DIAGNOSIS — I25118 Atherosclerotic heart disease of native coronary artery with other forms of angina pectoris: Secondary | ICD-10-CM | POA: Diagnosis not present

## 2018-04-14 DIAGNOSIS — Z951 Presence of aortocoronary bypass graft: Secondary | ICD-10-CM | POA: Diagnosis not present

## 2018-04-14 MED ORDER — CEPHALEXIN 250 MG PO CAPS: 250.0000 mg | ORAL_CAPSULE | Freq: Two times a day (BID) | ORAL | 0 refills | Status: AC

## 2018-04-14 MED ORDER — NITROFURANTOIN MONOHYD MACRO 100 MG PO CAPS: 100.0000 mg | ORAL_CAPSULE | Freq: Two times a day (BID) | ORAL | 0 refills | Status: AC

## 2018-04-14 NOTE — Telephone Encounter (Signed)
Called and cancelled the Macrobid rx as instructed.  [facility] nurse [phone]) notified as instructed by telephone. Was advised that someone else saw the patient over the weekend and Mitzi is not sure how she did over the weekend, but she did not get any negative report on patient during that time. Mitzi stated that things changed with patient about 3 weeks ago and they feel that she had a neurological event and has not been the same since. Mitzi stated that patient went from bed bound to up now walking around with her walker and some assistance. Mitzi stated that patient has a foley catheter and that is why she probably got the UTI. Mitzi stated that she is going to see the patient today and will call back with an up to date report on the patient.

## 2018-04-14 NOTE — Telephone Encounter (Addendum)
plz call hospice. UCx returned growing >100k E coli resistant to bactrim. rec stop bactrim, start keflex antibiotic for 1 wk. Let us k now if not improving with treatment.  What was reaction to penicillin and has she taken keflex in the past? plz call pharmacy and cancel macrobid Rx (I sent this in first). How did she do over weekend?  Lab Results  Component Value Date   CREATININE 0.90 11/17/2017   BUN 15 11/17/2017   NA 134 (L) 11/17/2017   K 4.3 11/17/2017   CL 99 (L) 11/17/2017   CO2 25 11/17/2017

## 2018-04-14 NOTE — Addendum Note (Signed)
Addended by: Ria Bush on: 04/14/2018 08:43 AM   Modules accepted: Orders

## 2018-04-15 NOTE — Telephone Encounter (Signed)
Spoke to [employer] by telephone and was advised that she saw the patient yesterday and she looks good and back to baseline prior to the episode that she had 3 weeks ago. Mitzi stated that patient is eating and drinking without issues. Mitzi stated that her urine looks good in foley now. Mitzi stated that she advised the family to get Benadryl and to use if patient develops a rash or itching. Mitzi stated that patient has been able to restart all of her medications because she is now able to swallow whole pills again. Mitzi stated that patient has stopped the Simvastatin and has not had to take any Lasix in a couple of weeks.  Mitzi stated that she will keep Dr. Damita Dunnings informed of any changes.

## 2018-04-15 NOTE — Telephone Encounter (Signed)
Noted. I thank all involved.  ?

## 2018-04-15 NOTE — Telephone Encounter (Signed)
Jill Howell is returning Jill Howell call

## 2018-04-16 DIAGNOSIS — Z8673 Personal history of transient ischemic attack (TIA), and cerebral infarction without residual deficits: Secondary | ICD-10-CM | POA: Diagnosis not present

## 2018-04-16 DIAGNOSIS — I5033 Acute on chronic diastolic (congestive) heart failure: Secondary | ICD-10-CM | POA: Diagnosis not present

## 2018-04-16 DIAGNOSIS — I25118 Atherosclerotic heart disease of native coronary artery with other forms of angina pectoris: Secondary | ICD-10-CM | POA: Diagnosis not present

## 2018-04-16 DIAGNOSIS — Z951 Presence of aortocoronary bypass graft: Secondary | ICD-10-CM | POA: Diagnosis not present

## 2018-04-16 DIAGNOSIS — I480 Paroxysmal atrial fibrillation: Secondary | ICD-10-CM | POA: Diagnosis not present

## 2018-04-16 DIAGNOSIS — I110 Hypertensive heart disease with heart failure: Secondary | ICD-10-CM | POA: Diagnosis not present

## 2018-04-17 DIAGNOSIS — I480 Paroxysmal atrial fibrillation: Secondary | ICD-10-CM | POA: Diagnosis not present

## 2018-04-17 DIAGNOSIS — I25118 Atherosclerotic heart disease of native coronary artery with other forms of angina pectoris: Secondary | ICD-10-CM | POA: Diagnosis not present

## 2018-04-17 DIAGNOSIS — I5033 Acute on chronic diastolic (congestive) heart failure: Secondary | ICD-10-CM | POA: Diagnosis not present

## 2018-04-17 DIAGNOSIS — Z8673 Personal history of transient ischemic attack (TIA), and cerebral infarction without residual deficits: Secondary | ICD-10-CM | POA: Diagnosis not present

## 2018-04-17 DIAGNOSIS — Z951 Presence of aortocoronary bypass graft: Secondary | ICD-10-CM | POA: Diagnosis not present

## 2018-04-17 DIAGNOSIS — I110 Hypertensive heart disease with heart failure: Secondary | ICD-10-CM | POA: Diagnosis not present

## 2018-04-20 DIAGNOSIS — I25118 Atherosclerotic heart disease of native coronary artery with other forms of angina pectoris: Secondary | ICD-10-CM | POA: Diagnosis not present

## 2018-04-20 DIAGNOSIS — Z8673 Personal history of transient ischemic attack (TIA), and cerebral infarction without residual deficits: Secondary | ICD-10-CM | POA: Diagnosis not present

## 2018-04-20 DIAGNOSIS — I5033 Acute on chronic diastolic (congestive) heart failure: Secondary | ICD-10-CM | POA: Diagnosis not present

## 2018-04-20 DIAGNOSIS — I480 Paroxysmal atrial fibrillation: Secondary | ICD-10-CM | POA: Diagnosis not present

## 2018-04-20 DIAGNOSIS — Z951 Presence of aortocoronary bypass graft: Secondary | ICD-10-CM | POA: Diagnosis not present

## 2018-04-20 DIAGNOSIS — I110 Hypertensive heart disease with heart failure: Secondary | ICD-10-CM | POA: Diagnosis not present

## 2018-04-21 DIAGNOSIS — I5033 Acute on chronic diastolic (congestive) heart failure: Secondary | ICD-10-CM | POA: Diagnosis not present

## 2018-04-21 DIAGNOSIS — I25118 Atherosclerotic heart disease of native coronary artery with other forms of angina pectoris: Secondary | ICD-10-CM | POA: Diagnosis not present

## 2018-04-21 DIAGNOSIS — Z8673 Personal history of transient ischemic attack (TIA), and cerebral infarction without residual deficits: Secondary | ICD-10-CM | POA: Diagnosis not present

## 2018-04-21 DIAGNOSIS — I480 Paroxysmal atrial fibrillation: Secondary | ICD-10-CM | POA: Diagnosis not present

## 2018-04-21 DIAGNOSIS — I110 Hypertensive heart disease with heart failure: Secondary | ICD-10-CM | POA: Diagnosis not present

## 2018-04-21 DIAGNOSIS — Z951 Presence of aortocoronary bypass graft: Secondary | ICD-10-CM | POA: Diagnosis not present

## 2018-04-22 DIAGNOSIS — I480 Paroxysmal atrial fibrillation: Secondary | ICD-10-CM

## 2018-04-22 DIAGNOSIS — I5033 Acute on chronic diastolic (congestive) heart failure: Secondary | ICD-10-CM

## 2018-04-22 DIAGNOSIS — I25118 Atherosclerotic heart disease of native coronary artery with other forms of angina pectoris: Secondary | ICD-10-CM

## 2018-04-22 DIAGNOSIS — I110 Hypertensive heart disease with heart failure: Secondary | ICD-10-CM

## 2018-04-23 DIAGNOSIS — I5033 Acute on chronic diastolic (congestive) heart failure: Secondary | ICD-10-CM | POA: Diagnosis not present

## 2018-04-23 DIAGNOSIS — Z951 Presence of aortocoronary bypass graft: Secondary | ICD-10-CM | POA: Diagnosis not present

## 2018-04-23 DIAGNOSIS — I25118 Atherosclerotic heart disease of native coronary artery with other forms of angina pectoris: Secondary | ICD-10-CM | POA: Diagnosis not present

## 2018-04-23 DIAGNOSIS — Z8673 Personal history of transient ischemic attack (TIA), and cerebral infarction without residual deficits: Secondary | ICD-10-CM | POA: Diagnosis not present

## 2018-04-23 DIAGNOSIS — I110 Hypertensive heart disease with heart failure: Secondary | ICD-10-CM | POA: Diagnosis not present

## 2018-04-23 DIAGNOSIS — I480 Paroxysmal atrial fibrillation: Secondary | ICD-10-CM | POA: Diagnosis not present

## 2018-04-27 DIAGNOSIS — I5033 Acute on chronic diastolic (congestive) heart failure: Secondary | ICD-10-CM | POA: Diagnosis not present

## 2018-04-27 DIAGNOSIS — I480 Paroxysmal atrial fibrillation: Secondary | ICD-10-CM | POA: Diagnosis not present

## 2018-04-27 DIAGNOSIS — Z8673 Personal history of transient ischemic attack (TIA), and cerebral infarction without residual deficits: Secondary | ICD-10-CM | POA: Diagnosis not present

## 2018-04-27 DIAGNOSIS — I110 Hypertensive heart disease with heart failure: Secondary | ICD-10-CM | POA: Diagnosis not present

## 2018-04-27 DIAGNOSIS — Z951 Presence of aortocoronary bypass graft: Secondary | ICD-10-CM | POA: Diagnosis not present

## 2018-04-27 DIAGNOSIS — I25118 Atherosclerotic heart disease of native coronary artery with other forms of angina pectoris: Secondary | ICD-10-CM | POA: Diagnosis not present

## 2018-04-28 DIAGNOSIS — Z951 Presence of aortocoronary bypass graft: Secondary | ICD-10-CM | POA: Diagnosis not present

## 2018-04-28 DIAGNOSIS — I25118 Atherosclerotic heart disease of native coronary artery with other forms of angina pectoris: Secondary | ICD-10-CM | POA: Diagnosis not present

## 2018-04-28 DIAGNOSIS — I110 Hypertensive heart disease with heart failure: Secondary | ICD-10-CM | POA: Diagnosis not present

## 2018-04-28 DIAGNOSIS — I5033 Acute on chronic diastolic (congestive) heart failure: Secondary | ICD-10-CM | POA: Diagnosis not present

## 2018-04-28 DIAGNOSIS — I480 Paroxysmal atrial fibrillation: Secondary | ICD-10-CM | POA: Diagnosis not present

## 2018-04-28 DIAGNOSIS — Z8673 Personal history of transient ischemic attack (TIA), and cerebral infarction without residual deficits: Secondary | ICD-10-CM | POA: Diagnosis not present

## 2018-04-30 ENCOUNTER — Encounter: Payer: Self-pay | Admitting: Family Medicine

## 2018-04-30 DIAGNOSIS — Z951 Presence of aortocoronary bypass graft: Secondary | ICD-10-CM | POA: Diagnosis not present

## 2018-04-30 DIAGNOSIS — I480 Paroxysmal atrial fibrillation: Secondary | ICD-10-CM | POA: Diagnosis not present

## 2018-04-30 DIAGNOSIS — I5033 Acute on chronic diastolic (congestive) heart failure: Secondary | ICD-10-CM | POA: Diagnosis not present

## 2018-04-30 DIAGNOSIS — Z8673 Personal history of transient ischemic attack (TIA), and cerebral infarction without residual deficits: Secondary | ICD-10-CM | POA: Diagnosis not present

## 2018-04-30 DIAGNOSIS — I110 Hypertensive heart disease with heart failure: Secondary | ICD-10-CM | POA: Diagnosis not present

## 2018-04-30 DIAGNOSIS — I25118 Atherosclerotic heart disease of native coronary artery with other forms of angina pectoris: Secondary | ICD-10-CM | POA: Diagnosis not present

## 2018-05-04 ENCOUNTER — Other Ambulatory Visit: Payer: Self-pay | Admitting: Family Medicine

## 2018-05-04 DIAGNOSIS — Z8673 Personal history of transient ischemic attack (TIA), and cerebral infarction without residual deficits: Secondary | ICD-10-CM | POA: Diagnosis not present

## 2018-05-04 DIAGNOSIS — I110 Hypertensive heart disease with heart failure: Secondary | ICD-10-CM | POA: Diagnosis not present

## 2018-05-04 DIAGNOSIS — I480 Paroxysmal atrial fibrillation: Secondary | ICD-10-CM | POA: Diagnosis not present

## 2018-05-04 DIAGNOSIS — I5033 Acute on chronic diastolic (congestive) heart failure: Secondary | ICD-10-CM | POA: Diagnosis not present

## 2018-05-04 DIAGNOSIS — I25118 Atherosclerotic heart disease of native coronary artery with other forms of angina pectoris: Secondary | ICD-10-CM | POA: Diagnosis not present

## 2018-05-04 DIAGNOSIS — Z951 Presence of aortocoronary bypass graft: Secondary | ICD-10-CM | POA: Diagnosis not present

## 2018-05-07 DIAGNOSIS — I5033 Acute on chronic diastolic (congestive) heart failure: Secondary | ICD-10-CM | POA: Diagnosis not present

## 2018-05-07 DIAGNOSIS — Z951 Presence of aortocoronary bypass graft: Secondary | ICD-10-CM | POA: Diagnosis not present

## 2018-05-07 DIAGNOSIS — Z8673 Personal history of transient ischemic attack (TIA), and cerebral infarction without residual deficits: Secondary | ICD-10-CM | POA: Diagnosis not present

## 2018-05-07 DIAGNOSIS — I25118 Atherosclerotic heart disease of native coronary artery with other forms of angina pectoris: Secondary | ICD-10-CM | POA: Diagnosis not present

## 2018-05-07 DIAGNOSIS — I480 Paroxysmal atrial fibrillation: Secondary | ICD-10-CM | POA: Diagnosis not present

## 2018-05-07 DIAGNOSIS — I110 Hypertensive heart disease with heart failure: Secondary | ICD-10-CM | POA: Diagnosis not present

## 2018-05-08 ENCOUNTER — Telehealth: Payer: Self-pay | Admitting: Family Medicine

## 2018-05-08 DIAGNOSIS — I5033 Acute on chronic diastolic (congestive) heart failure: Secondary | ICD-10-CM | POA: Diagnosis not present

## 2018-05-08 DIAGNOSIS — I25118 Atherosclerotic heart disease of native coronary artery with other forms of angina pectoris: Secondary | ICD-10-CM | POA: Diagnosis not present

## 2018-05-08 DIAGNOSIS — Z8673 Personal history of transient ischemic attack (TIA), and cerebral infarction without residual deficits: Secondary | ICD-10-CM | POA: Diagnosis not present

## 2018-05-08 DIAGNOSIS — Z951 Presence of aortocoronary bypass graft: Secondary | ICD-10-CM | POA: Diagnosis not present

## 2018-05-08 DIAGNOSIS — I110 Hypertensive heart disease with heart failure: Secondary | ICD-10-CM | POA: Diagnosis not present

## 2018-05-08 DIAGNOSIS — I480 Paroxysmal atrial fibrillation: Secondary | ICD-10-CM | POA: Diagnosis not present

## 2018-05-08 NOTE — Telephone Encounter (Signed)
Left detailed message on voicemail of son [name]) to return call with information.

## 2018-05-08 NOTE — Telephone Encounter (Signed)
Please get extra information.  Urine culture is resulted and negative.  Please get update on patient condition and let me know.  Thanks.

## 2018-05-10 DIAGNOSIS — I25118 Atherosclerotic heart disease of native coronary artery with other forms of angina pectoris: Secondary | ICD-10-CM | POA: Diagnosis not present

## 2018-05-10 DIAGNOSIS — I69818 Other symptoms and signs involving cognitive functions following other cerebrovascular disease: Secondary | ICD-10-CM | POA: Diagnosis not present

## 2018-05-10 DIAGNOSIS — L89322 Pressure ulcer of left buttock, stage 2: Secondary | ICD-10-CM | POA: Diagnosis not present

## 2018-05-10 DIAGNOSIS — Z951 Presence of aortocoronary bypass graft: Secondary | ICD-10-CM | POA: Diagnosis not present

## 2018-05-10 DIAGNOSIS — I480 Paroxysmal atrial fibrillation: Secondary | ICD-10-CM | POA: Diagnosis not present

## 2018-05-10 DIAGNOSIS — E785 Hyperlipidemia, unspecified: Secondary | ICD-10-CM | POA: Diagnosis not present

## 2018-05-10 DIAGNOSIS — I110 Hypertensive heart disease with heart failure: Secondary | ICD-10-CM | POA: Diagnosis not present

## 2018-05-10 DIAGNOSIS — R159 Full incontinence of feces: Secondary | ICD-10-CM | POA: Diagnosis not present

## 2018-05-10 DIAGNOSIS — Z9981 Dependence on supplemental oxygen: Secondary | ICD-10-CM | POA: Diagnosis not present

## 2018-05-10 DIAGNOSIS — K219 Gastro-esophageal reflux disease without esophagitis: Secondary | ICD-10-CM | POA: Diagnosis not present

## 2018-05-10 DIAGNOSIS — I5033 Acute on chronic diastolic (congestive) heart failure: Secondary | ICD-10-CM | POA: Diagnosis not present

## 2018-05-10 DIAGNOSIS — Z7401 Bed confinement status: Secondary | ICD-10-CM | POA: Diagnosis not present

## 2018-05-10 DIAGNOSIS — R32 Unspecified urinary incontinence: Secondary | ICD-10-CM | POA: Diagnosis not present

## 2018-05-10 DIAGNOSIS — L89312 Pressure ulcer of right buttock, stage 2: Secondary | ICD-10-CM | POA: Diagnosis not present

## 2018-05-10 DIAGNOSIS — D649 Anemia, unspecified: Secondary | ICD-10-CM | POA: Diagnosis not present

## 2018-05-10 DIAGNOSIS — Z681 Body mass index (BMI) 19 or less, adult: Secondary | ICD-10-CM | POA: Diagnosis not present

## 2018-05-10 DIAGNOSIS — R627 Adult failure to thrive: Secondary | ICD-10-CM | POA: Diagnosis not present

## 2018-05-10 DIAGNOSIS — I69891 Dysphagia following other cerebrovascular disease: Secondary | ICD-10-CM | POA: Diagnosis not present

## 2018-05-10 DIAGNOSIS — E039 Hypothyroidism, unspecified: Secondary | ICD-10-CM | POA: Diagnosis not present

## 2018-05-10 DIAGNOSIS — Z466 Encounter for fitting and adjustment of urinary device: Secondary | ICD-10-CM | POA: Diagnosis not present

## 2018-05-12 DIAGNOSIS — I25118 Atherosclerotic heart disease of native coronary artery with other forms of angina pectoris: Secondary | ICD-10-CM | POA: Diagnosis not present

## 2018-05-12 DIAGNOSIS — I480 Paroxysmal atrial fibrillation: Secondary | ICD-10-CM | POA: Diagnosis not present

## 2018-05-12 DIAGNOSIS — I5033 Acute on chronic diastolic (congestive) heart failure: Secondary | ICD-10-CM | POA: Diagnosis not present

## 2018-05-12 DIAGNOSIS — I69818 Other symptoms and signs involving cognitive functions following other cerebrovascular disease: Secondary | ICD-10-CM | POA: Diagnosis not present

## 2018-05-12 DIAGNOSIS — I69891 Dysphagia following other cerebrovascular disease: Secondary | ICD-10-CM | POA: Diagnosis not present

## 2018-05-12 DIAGNOSIS — I110 Hypertensive heart disease with heart failure: Secondary | ICD-10-CM | POA: Diagnosis not present

## 2018-05-13 DIAGNOSIS — I25118 Atherosclerotic heart disease of native coronary artery with other forms of angina pectoris: Secondary | ICD-10-CM | POA: Diagnosis not present

## 2018-05-13 DIAGNOSIS — I5033 Acute on chronic diastolic (congestive) heart failure: Secondary | ICD-10-CM | POA: Diagnosis not present

## 2018-05-13 DIAGNOSIS — I69818 Other symptoms and signs involving cognitive functions following other cerebrovascular disease: Secondary | ICD-10-CM | POA: Diagnosis not present

## 2018-05-13 DIAGNOSIS — I480 Paroxysmal atrial fibrillation: Secondary | ICD-10-CM | POA: Diagnosis not present

## 2018-05-13 DIAGNOSIS — I69891 Dysphagia following other cerebrovascular disease: Secondary | ICD-10-CM | POA: Diagnosis not present

## 2018-05-13 DIAGNOSIS — I110 Hypertensive heart disease with heart failure: Secondary | ICD-10-CM | POA: Diagnosis not present

## 2018-05-14 ENCOUNTER — Other Ambulatory Visit: Payer: Self-pay | Admitting: Family Medicine

## 2018-05-14 DIAGNOSIS — I69891 Dysphagia following other cerebrovascular disease: Secondary | ICD-10-CM | POA: Diagnosis not present

## 2018-05-14 DIAGNOSIS — I5033 Acute on chronic diastolic (congestive) heart failure: Secondary | ICD-10-CM | POA: Diagnosis not present

## 2018-05-14 DIAGNOSIS — I110 Hypertensive heart disease with heart failure: Secondary | ICD-10-CM | POA: Diagnosis not present

## 2018-05-14 DIAGNOSIS — I480 Paroxysmal atrial fibrillation: Secondary | ICD-10-CM | POA: Diagnosis not present

## 2018-05-14 DIAGNOSIS — I25118 Atherosclerotic heart disease of native coronary artery with other forms of angina pectoris: Secondary | ICD-10-CM | POA: Diagnosis not present

## 2018-05-14 DIAGNOSIS — I69818 Other symptoms and signs involving cognitive functions following other cerebrovascular disease: Secondary | ICD-10-CM | POA: Diagnosis not present

## 2018-05-14 NOTE — Telephone Encounter (Signed)
Electronic refill request. Benzonatate Last office visit:   02/19/18 Acute Last Filled:   10/02/17 Please advise.

## 2018-05-15 DIAGNOSIS — I110 Hypertensive heart disease with heart failure: Secondary | ICD-10-CM | POA: Diagnosis not present

## 2018-05-15 DIAGNOSIS — I69891 Dysphagia following other cerebrovascular disease: Secondary | ICD-10-CM | POA: Diagnosis not present

## 2018-05-15 DIAGNOSIS — I69818 Other symptoms and signs involving cognitive functions following other cerebrovascular disease: Secondary | ICD-10-CM | POA: Diagnosis not present

## 2018-05-15 DIAGNOSIS — I25118 Atherosclerotic heart disease of native coronary artery with other forms of angina pectoris: Secondary | ICD-10-CM | POA: Diagnosis not present

## 2018-05-15 DIAGNOSIS — I5033 Acute on chronic diastolic (congestive) heart failure: Secondary | ICD-10-CM | POA: Diagnosis not present

## 2018-05-15 DIAGNOSIS — I480 Paroxysmal atrial fibrillation: Secondary | ICD-10-CM | POA: Diagnosis not present

## 2018-05-17 NOTE — Telephone Encounter (Signed)
Sent. Please update me if cough continues.  Thanks.

## 2018-05-18 DIAGNOSIS — I110 Hypertensive heart disease with heart failure: Secondary | ICD-10-CM | POA: Diagnosis not present

## 2018-05-18 DIAGNOSIS — I5033 Acute on chronic diastolic (congestive) heart failure: Secondary | ICD-10-CM | POA: Diagnosis not present

## 2018-05-18 DIAGNOSIS — I69891 Dysphagia following other cerebrovascular disease: Secondary | ICD-10-CM | POA: Diagnosis not present

## 2018-05-18 DIAGNOSIS — I480 Paroxysmal atrial fibrillation: Secondary | ICD-10-CM | POA: Diagnosis not present

## 2018-05-18 DIAGNOSIS — I69818 Other symptoms and signs involving cognitive functions following other cerebrovascular disease: Secondary | ICD-10-CM | POA: Diagnosis not present

## 2018-05-18 DIAGNOSIS — I25118 Atherosclerotic heart disease of native coronary artery with other forms of angina pectoris: Secondary | ICD-10-CM | POA: Diagnosis not present

## 2018-05-18 NOTE — Telephone Encounter (Signed)
I spoke with Jill Howell, pts son, pt was on lasix for 3 days which helps pts breathing;cough is better. hospice nurse is coming into the home to assist with pts care also. Jill Howell said he had called x3 and waited on phone each time and could not speak with nurse so Jill Howell hung up and went back to work.robert said it was to give info about pt's cough which he has already done. I apologized for that and advised that in near future will be taking calls back into our office because we have heard concerns of pts and their family members about not being able to reach [facility] directly. Jill Howell was glad to hear that. FYI to Dr Damita Dunnings.

## 2018-05-18 NOTE — Telephone Encounter (Signed)
Copied from [place] [id number]. Topic: Quick Communication - See Telephone Encounter >> May 18, 2018  9:21 AM Lurlean Nanny, [place], LPN wrote: CRM for notification. See Telephone encounter for: 05/18/18. Okay for triage nurse to talk with patient's son about cough when he calls back per Dr. Josefine Class note. >> May 18, 2018  1:07 PM Vernona Rieger wrote: Patient's son, [name] called and said she is doing good with her cough. He said he has called three times and can not get a nurse each time. He said just let Dr Damita Dunnings know that.  Call back @ [phone]

## 2018-05-18 NOTE — Telephone Encounter (Signed)
Thanks

## 2018-05-18 NOTE — Telephone Encounter (Addendum)
Left message on voicemail for patient's son to call back. 

## 2018-05-19 DIAGNOSIS — I25118 Atherosclerotic heart disease of native coronary artery with other forms of angina pectoris: Secondary | ICD-10-CM | POA: Diagnosis not present

## 2018-05-19 DIAGNOSIS — I5033 Acute on chronic diastolic (congestive) heart failure: Secondary | ICD-10-CM | POA: Diagnosis not present

## 2018-05-19 DIAGNOSIS — I69891 Dysphagia following other cerebrovascular disease: Secondary | ICD-10-CM | POA: Diagnosis not present

## 2018-05-19 DIAGNOSIS — I69818 Other symptoms and signs involving cognitive functions following other cerebrovascular disease: Secondary | ICD-10-CM | POA: Diagnosis not present

## 2018-05-19 DIAGNOSIS — I480 Paroxysmal atrial fibrillation: Secondary | ICD-10-CM | POA: Diagnosis not present

## 2018-05-19 DIAGNOSIS — I110 Hypertensive heart disease with heart failure: Secondary | ICD-10-CM | POA: Diagnosis not present

## 2018-05-21 DIAGNOSIS — I69891 Dysphagia following other cerebrovascular disease: Secondary | ICD-10-CM | POA: Diagnosis not present

## 2018-05-21 DIAGNOSIS — I25118 Atherosclerotic heart disease of native coronary artery with other forms of angina pectoris: Secondary | ICD-10-CM | POA: Diagnosis not present

## 2018-05-21 DIAGNOSIS — I110 Hypertensive heart disease with heart failure: Secondary | ICD-10-CM | POA: Diagnosis not present

## 2018-05-21 DIAGNOSIS — I480 Paroxysmal atrial fibrillation: Secondary | ICD-10-CM | POA: Diagnosis not present

## 2018-05-21 DIAGNOSIS — I69818 Other symptoms and signs involving cognitive functions following other cerebrovascular disease: Secondary | ICD-10-CM | POA: Diagnosis not present

## 2018-05-21 DIAGNOSIS — I5033 Acute on chronic diastolic (congestive) heart failure: Secondary | ICD-10-CM | POA: Diagnosis not present

## 2018-05-25 DIAGNOSIS — I69891 Dysphagia following other cerebrovascular disease: Secondary | ICD-10-CM | POA: Diagnosis not present

## 2018-05-25 DIAGNOSIS — I5033 Acute on chronic diastolic (congestive) heart failure: Secondary | ICD-10-CM | POA: Diagnosis not present

## 2018-05-25 DIAGNOSIS — I69818 Other symptoms and signs involving cognitive functions following other cerebrovascular disease: Secondary | ICD-10-CM | POA: Diagnosis not present

## 2018-05-25 DIAGNOSIS — I110 Hypertensive heart disease with heart failure: Secondary | ICD-10-CM | POA: Diagnosis not present

## 2018-05-25 DIAGNOSIS — I25118 Atherosclerotic heart disease of native coronary artery with other forms of angina pectoris: Secondary | ICD-10-CM | POA: Diagnosis not present

## 2018-05-25 DIAGNOSIS — I480 Paroxysmal atrial fibrillation: Secondary | ICD-10-CM | POA: Diagnosis not present

## 2018-05-26 DIAGNOSIS — I110 Hypertensive heart disease with heart failure: Secondary | ICD-10-CM | POA: Diagnosis not present

## 2018-05-26 DIAGNOSIS — I69818 Other symptoms and signs involving cognitive functions following other cerebrovascular disease: Secondary | ICD-10-CM | POA: Diagnosis not present

## 2018-05-26 DIAGNOSIS — I25118 Atherosclerotic heart disease of native coronary artery with other forms of angina pectoris: Secondary | ICD-10-CM | POA: Diagnosis not present

## 2018-05-26 DIAGNOSIS — I69891 Dysphagia following other cerebrovascular disease: Secondary | ICD-10-CM | POA: Diagnosis not present

## 2018-05-26 DIAGNOSIS — I5033 Acute on chronic diastolic (congestive) heart failure: Secondary | ICD-10-CM | POA: Diagnosis not present

## 2018-05-26 DIAGNOSIS — I480 Paroxysmal atrial fibrillation: Secondary | ICD-10-CM | POA: Diagnosis not present

## 2018-05-28 DIAGNOSIS — I25118 Atherosclerotic heart disease of native coronary artery with other forms of angina pectoris: Secondary | ICD-10-CM | POA: Diagnosis not present

## 2018-05-28 DIAGNOSIS — I69891 Dysphagia following other cerebrovascular disease: Secondary | ICD-10-CM | POA: Diagnosis not present

## 2018-05-28 DIAGNOSIS — I69818 Other symptoms and signs involving cognitive functions following other cerebrovascular disease: Secondary | ICD-10-CM | POA: Diagnosis not present

## 2018-05-28 DIAGNOSIS — I5033 Acute on chronic diastolic (congestive) heart failure: Secondary | ICD-10-CM | POA: Diagnosis not present

## 2018-05-28 DIAGNOSIS — I480 Paroxysmal atrial fibrillation: Secondary | ICD-10-CM | POA: Diagnosis not present

## 2018-05-28 DIAGNOSIS — I110 Hypertensive heart disease with heart failure: Secondary | ICD-10-CM | POA: Diagnosis not present

## 2018-06-01 DIAGNOSIS — I5033 Acute on chronic diastolic (congestive) heart failure: Secondary | ICD-10-CM | POA: Diagnosis not present

## 2018-06-01 DIAGNOSIS — I110 Hypertensive heart disease with heart failure: Secondary | ICD-10-CM | POA: Diagnosis not present

## 2018-06-01 DIAGNOSIS — I480 Paroxysmal atrial fibrillation: Secondary | ICD-10-CM | POA: Diagnosis not present

## 2018-06-01 DIAGNOSIS — I69891 Dysphagia following other cerebrovascular disease: Secondary | ICD-10-CM | POA: Diagnosis not present

## 2018-06-01 DIAGNOSIS — I69818 Other symptoms and signs involving cognitive functions following other cerebrovascular disease: Secondary | ICD-10-CM | POA: Diagnosis not present

## 2018-06-01 DIAGNOSIS — I25118 Atherosclerotic heart disease of native coronary artery with other forms of angina pectoris: Secondary | ICD-10-CM | POA: Diagnosis not present

## 2018-06-02 DIAGNOSIS — I25118 Atherosclerotic heart disease of native coronary artery with other forms of angina pectoris: Secondary | ICD-10-CM | POA: Diagnosis not present

## 2018-06-02 DIAGNOSIS — I480 Paroxysmal atrial fibrillation: Secondary | ICD-10-CM | POA: Diagnosis not present

## 2018-06-02 DIAGNOSIS — I110 Hypertensive heart disease with heart failure: Secondary | ICD-10-CM | POA: Diagnosis not present

## 2018-06-02 DIAGNOSIS — I69818 Other symptoms and signs involving cognitive functions following other cerebrovascular disease: Secondary | ICD-10-CM | POA: Diagnosis not present

## 2018-06-02 DIAGNOSIS — I5033 Acute on chronic diastolic (congestive) heart failure: Secondary | ICD-10-CM | POA: Diagnosis not present

## 2018-06-02 DIAGNOSIS — I69891 Dysphagia following other cerebrovascular disease: Secondary | ICD-10-CM | POA: Diagnosis not present

## 2018-06-04 DIAGNOSIS — I110 Hypertensive heart disease with heart failure: Secondary | ICD-10-CM | POA: Diagnosis not present

## 2018-06-04 DIAGNOSIS — I69818 Other symptoms and signs involving cognitive functions following other cerebrovascular disease: Secondary | ICD-10-CM | POA: Diagnosis not present

## 2018-06-04 DIAGNOSIS — I25118 Atherosclerotic heart disease of native coronary artery with other forms of angina pectoris: Secondary | ICD-10-CM | POA: Diagnosis not present

## 2018-06-04 DIAGNOSIS — I5033 Acute on chronic diastolic (congestive) heart failure: Secondary | ICD-10-CM | POA: Diagnosis not present

## 2018-06-04 DIAGNOSIS — I69891 Dysphagia following other cerebrovascular disease: Secondary | ICD-10-CM | POA: Diagnosis not present

## 2018-06-04 DIAGNOSIS — I480 Paroxysmal atrial fibrillation: Secondary | ICD-10-CM | POA: Diagnosis not present

## 2018-06-08 DIAGNOSIS — I5033 Acute on chronic diastolic (congestive) heart failure: Secondary | ICD-10-CM | POA: Diagnosis not present

## 2018-06-08 DIAGNOSIS — I69891 Dysphagia following other cerebrovascular disease: Secondary | ICD-10-CM | POA: Diagnosis not present

## 2018-06-08 DIAGNOSIS — I25118 Atherosclerotic heart disease of native coronary artery with other forms of angina pectoris: Secondary | ICD-10-CM | POA: Diagnosis not present

## 2018-06-08 DIAGNOSIS — I110 Hypertensive heart disease with heart failure: Secondary | ICD-10-CM | POA: Diagnosis not present

## 2018-06-08 DIAGNOSIS — I480 Paroxysmal atrial fibrillation: Secondary | ICD-10-CM | POA: Diagnosis not present

## 2018-06-08 DIAGNOSIS — I69818 Other symptoms and signs involving cognitive functions following other cerebrovascular disease: Secondary | ICD-10-CM | POA: Diagnosis not present

## 2018-06-09 DIAGNOSIS — Z466 Encounter for fitting and adjustment of urinary device: Secondary | ICD-10-CM | POA: Diagnosis not present

## 2018-06-09 DIAGNOSIS — L89322 Pressure ulcer of left buttock, stage 2: Secondary | ICD-10-CM | POA: Diagnosis not present

## 2018-06-09 DIAGNOSIS — D649 Anemia, unspecified: Secondary | ICD-10-CM | POA: Diagnosis not present

## 2018-06-09 DIAGNOSIS — R159 Full incontinence of feces: Secondary | ICD-10-CM | POA: Diagnosis not present

## 2018-06-09 DIAGNOSIS — E785 Hyperlipidemia, unspecified: Secondary | ICD-10-CM | POA: Diagnosis not present

## 2018-06-09 DIAGNOSIS — L89312 Pressure ulcer of right buttock, stage 2: Secondary | ICD-10-CM | POA: Diagnosis not present

## 2018-06-09 DIAGNOSIS — Z681 Body mass index (BMI) 19 or less, adult: Secondary | ICD-10-CM | POA: Diagnosis not present

## 2018-06-09 DIAGNOSIS — I69891 Dysphagia following other cerebrovascular disease: Secondary | ICD-10-CM | POA: Diagnosis not present

## 2018-06-09 DIAGNOSIS — R627 Adult failure to thrive: Secondary | ICD-10-CM | POA: Diagnosis not present

## 2018-06-09 DIAGNOSIS — I110 Hypertensive heart disease with heart failure: Secondary | ICD-10-CM | POA: Diagnosis not present

## 2018-06-09 DIAGNOSIS — I69818 Other symptoms and signs involving cognitive functions following other cerebrovascular disease: Secondary | ICD-10-CM | POA: Diagnosis not present

## 2018-06-09 DIAGNOSIS — K219 Gastro-esophageal reflux disease without esophagitis: Secondary | ICD-10-CM | POA: Diagnosis not present

## 2018-06-09 DIAGNOSIS — I5033 Acute on chronic diastolic (congestive) heart failure: Secondary | ICD-10-CM | POA: Diagnosis not present

## 2018-06-09 DIAGNOSIS — Z951 Presence of aortocoronary bypass graft: Secondary | ICD-10-CM | POA: Diagnosis not present

## 2018-06-09 DIAGNOSIS — Z7401 Bed confinement status: Secondary | ICD-10-CM | POA: Diagnosis not present

## 2018-06-09 DIAGNOSIS — Z9981 Dependence on supplemental oxygen: Secondary | ICD-10-CM | POA: Diagnosis not present

## 2018-06-09 DIAGNOSIS — I480 Paroxysmal atrial fibrillation: Secondary | ICD-10-CM | POA: Diagnosis not present

## 2018-06-09 DIAGNOSIS — R32 Unspecified urinary incontinence: Secondary | ICD-10-CM | POA: Diagnosis not present

## 2018-06-09 DIAGNOSIS — E039 Hypothyroidism, unspecified: Secondary | ICD-10-CM | POA: Diagnosis not present

## 2018-06-09 DIAGNOSIS — I25118 Atherosclerotic heart disease of native coronary artery with other forms of angina pectoris: Secondary | ICD-10-CM | POA: Diagnosis not present

## 2018-06-11 DIAGNOSIS — I110 Hypertensive heart disease with heart failure: Secondary | ICD-10-CM | POA: Diagnosis not present

## 2018-06-11 DIAGNOSIS — I69891 Dysphagia following other cerebrovascular disease: Secondary | ICD-10-CM | POA: Diagnosis not present

## 2018-06-11 DIAGNOSIS — I480 Paroxysmal atrial fibrillation: Secondary | ICD-10-CM | POA: Diagnosis not present

## 2018-06-11 DIAGNOSIS — I25118 Atherosclerotic heart disease of native coronary artery with other forms of angina pectoris: Secondary | ICD-10-CM | POA: Diagnosis not present

## 2018-06-11 DIAGNOSIS — I69818 Other symptoms and signs involving cognitive functions following other cerebrovascular disease: Secondary | ICD-10-CM | POA: Diagnosis not present

## 2018-06-11 DIAGNOSIS — I5033 Acute on chronic diastolic (congestive) heart failure: Secondary | ICD-10-CM | POA: Diagnosis not present

## 2018-06-12 ENCOUNTER — Other Ambulatory Visit: Payer: Self-pay | Admitting: Family Medicine

## 2018-06-12 DIAGNOSIS — I69891 Dysphagia following other cerebrovascular disease: Secondary | ICD-10-CM | POA: Diagnosis not present

## 2018-06-12 DIAGNOSIS — I480 Paroxysmal atrial fibrillation: Secondary | ICD-10-CM | POA: Diagnosis not present

## 2018-06-12 DIAGNOSIS — I25118 Atherosclerotic heart disease of native coronary artery with other forms of angina pectoris: Secondary | ICD-10-CM | POA: Diagnosis not present

## 2018-06-12 DIAGNOSIS — I5033 Acute on chronic diastolic (congestive) heart failure: Secondary | ICD-10-CM | POA: Diagnosis not present

## 2018-06-12 DIAGNOSIS — I69818 Other symptoms and signs involving cognitive functions following other cerebrovascular disease: Secondary | ICD-10-CM | POA: Diagnosis not present

## 2018-06-12 DIAGNOSIS — I110 Hypertensive heart disease with heart failure: Secondary | ICD-10-CM | POA: Diagnosis not present

## 2018-06-15 DIAGNOSIS — I110 Hypertensive heart disease with heart failure: Secondary | ICD-10-CM | POA: Diagnosis not present

## 2018-06-15 DIAGNOSIS — I69891 Dysphagia following other cerebrovascular disease: Secondary | ICD-10-CM | POA: Diagnosis not present

## 2018-06-15 DIAGNOSIS — I69818 Other symptoms and signs involving cognitive functions following other cerebrovascular disease: Secondary | ICD-10-CM | POA: Diagnosis not present

## 2018-06-15 DIAGNOSIS — I25118 Atherosclerotic heart disease of native coronary artery with other forms of angina pectoris: Secondary | ICD-10-CM | POA: Diagnosis not present

## 2018-06-15 DIAGNOSIS — I5033 Acute on chronic diastolic (congestive) heart failure: Secondary | ICD-10-CM | POA: Diagnosis not present

## 2018-06-15 DIAGNOSIS — I480 Paroxysmal atrial fibrillation: Secondary | ICD-10-CM | POA: Diagnosis not present

## 2018-06-17 DIAGNOSIS — I480 Paroxysmal atrial fibrillation: Secondary | ICD-10-CM | POA: Diagnosis not present

## 2018-06-17 DIAGNOSIS — I69818 Other symptoms and signs involving cognitive functions following other cerebrovascular disease: Secondary | ICD-10-CM | POA: Diagnosis not present

## 2018-06-17 DIAGNOSIS — I69891 Dysphagia following other cerebrovascular disease: Secondary | ICD-10-CM | POA: Diagnosis not present

## 2018-06-17 DIAGNOSIS — I110 Hypertensive heart disease with heart failure: Secondary | ICD-10-CM | POA: Diagnosis not present

## 2018-06-17 DIAGNOSIS — I25118 Atherosclerotic heart disease of native coronary artery with other forms of angina pectoris: Secondary | ICD-10-CM | POA: Diagnosis not present

## 2018-06-17 DIAGNOSIS — I5033 Acute on chronic diastolic (congestive) heart failure: Secondary | ICD-10-CM | POA: Diagnosis not present

## 2018-06-18 DIAGNOSIS — I69818 Other symptoms and signs involving cognitive functions following other cerebrovascular disease: Secondary | ICD-10-CM | POA: Diagnosis not present

## 2018-06-18 DIAGNOSIS — I5033 Acute on chronic diastolic (congestive) heart failure: Secondary | ICD-10-CM | POA: Diagnosis not present

## 2018-06-18 DIAGNOSIS — I110 Hypertensive heart disease with heart failure: Secondary | ICD-10-CM | POA: Diagnosis not present

## 2018-06-18 DIAGNOSIS — I480 Paroxysmal atrial fibrillation: Secondary | ICD-10-CM | POA: Diagnosis not present

## 2018-06-18 DIAGNOSIS — I25118 Atherosclerotic heart disease of native coronary artery with other forms of angina pectoris: Secondary | ICD-10-CM | POA: Diagnosis not present

## 2018-06-18 DIAGNOSIS — I69891 Dysphagia following other cerebrovascular disease: Secondary | ICD-10-CM | POA: Diagnosis not present

## 2018-06-22 DIAGNOSIS — I5033 Acute on chronic diastolic (congestive) heart failure: Secondary | ICD-10-CM | POA: Diagnosis not present

## 2018-06-22 DIAGNOSIS — I69891 Dysphagia following other cerebrovascular disease: Secondary | ICD-10-CM | POA: Diagnosis not present

## 2018-06-22 DIAGNOSIS — I69818 Other symptoms and signs involving cognitive functions following other cerebrovascular disease: Secondary | ICD-10-CM | POA: Diagnosis not present

## 2018-06-22 DIAGNOSIS — I25118 Atherosclerotic heart disease of native coronary artery with other forms of angina pectoris: Secondary | ICD-10-CM | POA: Diagnosis not present

## 2018-06-22 DIAGNOSIS — I480 Paroxysmal atrial fibrillation: Secondary | ICD-10-CM | POA: Diagnosis not present

## 2018-06-22 DIAGNOSIS — I110 Hypertensive heart disease with heart failure: Secondary | ICD-10-CM | POA: Diagnosis not present

## 2018-06-23 DIAGNOSIS — I110 Hypertensive heart disease with heart failure: Secondary | ICD-10-CM | POA: Diagnosis not present

## 2018-06-23 DIAGNOSIS — I480 Paroxysmal atrial fibrillation: Secondary | ICD-10-CM | POA: Diagnosis not present

## 2018-06-23 DIAGNOSIS — I25118 Atherosclerotic heart disease of native coronary artery with other forms of angina pectoris: Secondary | ICD-10-CM | POA: Diagnosis not present

## 2018-06-23 DIAGNOSIS — I69891 Dysphagia following other cerebrovascular disease: Secondary | ICD-10-CM | POA: Diagnosis not present

## 2018-06-23 DIAGNOSIS — I5033 Acute on chronic diastolic (congestive) heart failure: Secondary | ICD-10-CM | POA: Diagnosis not present

## 2018-06-23 DIAGNOSIS — I69818 Other symptoms and signs involving cognitive functions following other cerebrovascular disease: Secondary | ICD-10-CM | POA: Diagnosis not present

## 2018-06-24 DIAGNOSIS — I69891 Dysphagia following other cerebrovascular disease: Secondary | ICD-10-CM | POA: Diagnosis not present

## 2018-06-24 DIAGNOSIS — I69818 Other symptoms and signs involving cognitive functions following other cerebrovascular disease: Secondary | ICD-10-CM | POA: Diagnosis not present

## 2018-06-24 DIAGNOSIS — I25118 Atherosclerotic heart disease of native coronary artery with other forms of angina pectoris: Secondary | ICD-10-CM | POA: Diagnosis not present

## 2018-06-24 DIAGNOSIS — I5033 Acute on chronic diastolic (congestive) heart failure: Secondary | ICD-10-CM | POA: Diagnosis not present

## 2018-06-24 DIAGNOSIS — I110 Hypertensive heart disease with heart failure: Secondary | ICD-10-CM | POA: Diagnosis not present

## 2018-06-24 DIAGNOSIS — I480 Paroxysmal atrial fibrillation: Secondary | ICD-10-CM | POA: Diagnosis not present

## 2018-06-25 DIAGNOSIS — I5033 Acute on chronic diastolic (congestive) heart failure: Secondary | ICD-10-CM | POA: Diagnosis not present

## 2018-06-25 DIAGNOSIS — I69891 Dysphagia following other cerebrovascular disease: Secondary | ICD-10-CM | POA: Diagnosis not present

## 2018-06-25 DIAGNOSIS — I110 Hypertensive heart disease with heart failure: Secondary | ICD-10-CM | POA: Diagnosis not present

## 2018-06-25 DIAGNOSIS — I25118 Atherosclerotic heart disease of native coronary artery with other forms of angina pectoris: Secondary | ICD-10-CM | POA: Diagnosis not present

## 2018-06-25 DIAGNOSIS — I69818 Other symptoms and signs involving cognitive functions following other cerebrovascular disease: Secondary | ICD-10-CM | POA: Diagnosis not present

## 2018-06-25 DIAGNOSIS — I480 Paroxysmal atrial fibrillation: Secondary | ICD-10-CM | POA: Diagnosis not present

## 2018-06-29 DIAGNOSIS — I69891 Dysphagia following other cerebrovascular disease: Secondary | ICD-10-CM | POA: Diagnosis not present

## 2018-06-29 DIAGNOSIS — I25118 Atherosclerotic heart disease of native coronary artery with other forms of angina pectoris: Secondary | ICD-10-CM | POA: Diagnosis not present

## 2018-06-29 DIAGNOSIS — I110 Hypertensive heart disease with heart failure: Secondary | ICD-10-CM | POA: Diagnosis not present

## 2018-06-29 DIAGNOSIS — I480 Paroxysmal atrial fibrillation: Secondary | ICD-10-CM | POA: Diagnosis not present

## 2018-06-29 DIAGNOSIS — I5033 Acute on chronic diastolic (congestive) heart failure: Secondary | ICD-10-CM | POA: Diagnosis not present

## 2018-06-29 DIAGNOSIS — I69818 Other symptoms and signs involving cognitive functions following other cerebrovascular disease: Secondary | ICD-10-CM | POA: Diagnosis not present

## 2018-07-01 ENCOUNTER — Other Ambulatory Visit: Payer: Self-pay | Admitting: Family Medicine

## 2018-07-01 DIAGNOSIS — I25118 Atherosclerotic heart disease of native coronary artery with other forms of angina pectoris: Secondary | ICD-10-CM | POA: Diagnosis not present

## 2018-07-01 DIAGNOSIS — I69891 Dysphagia following other cerebrovascular disease: Secondary | ICD-10-CM | POA: Diagnosis not present

## 2018-07-01 DIAGNOSIS — I480 Paroxysmal atrial fibrillation: Secondary | ICD-10-CM | POA: Diagnosis not present

## 2018-07-01 DIAGNOSIS — I69818 Other symptoms and signs involving cognitive functions following other cerebrovascular disease: Secondary | ICD-10-CM | POA: Diagnosis not present

## 2018-07-01 DIAGNOSIS — I5033 Acute on chronic diastolic (congestive) heart failure: Secondary | ICD-10-CM | POA: Diagnosis not present

## 2018-07-01 DIAGNOSIS — I110 Hypertensive heart disease with heart failure: Secondary | ICD-10-CM | POA: Diagnosis not present

## 2018-07-01 NOTE — Telephone Encounter (Signed)
Electronic refill request Last office visit 02/19/18 Last refill 05/17/18 #30/1

## 2018-07-02 DIAGNOSIS — I480 Paroxysmal atrial fibrillation: Secondary | ICD-10-CM | POA: Diagnosis not present

## 2018-07-02 DIAGNOSIS — I110 Hypertensive heart disease with heart failure: Secondary | ICD-10-CM | POA: Diagnosis not present

## 2018-07-02 DIAGNOSIS — I69818 Other symptoms and signs involving cognitive functions following other cerebrovascular disease: Secondary | ICD-10-CM | POA: Diagnosis not present

## 2018-07-02 DIAGNOSIS — I5033 Acute on chronic diastolic (congestive) heart failure: Secondary | ICD-10-CM | POA: Diagnosis not present

## 2018-07-02 DIAGNOSIS — I69891 Dysphagia following other cerebrovascular disease: Secondary | ICD-10-CM | POA: Diagnosis not present

## 2018-07-02 DIAGNOSIS — I25118 Atherosclerotic heart disease of native coronary artery with other forms of angina pectoris: Secondary | ICD-10-CM | POA: Diagnosis not present

## 2018-07-02 NOTE — Telephone Encounter (Signed)
Sent. Thanks.   

## 2018-07-04 DIAGNOSIS — I110 Hypertensive heart disease with heart failure: Secondary | ICD-10-CM | POA: Diagnosis not present

## 2018-07-04 DIAGNOSIS — I69818 Other symptoms and signs involving cognitive functions following other cerebrovascular disease: Secondary | ICD-10-CM | POA: Diagnosis not present

## 2018-07-04 DIAGNOSIS — I69891 Dysphagia following other cerebrovascular disease: Secondary | ICD-10-CM | POA: Diagnosis not present

## 2018-07-04 DIAGNOSIS — I480 Paroxysmal atrial fibrillation: Secondary | ICD-10-CM | POA: Diagnosis not present

## 2018-07-04 DIAGNOSIS — I5033 Acute on chronic diastolic (congestive) heart failure: Secondary | ICD-10-CM | POA: Diagnosis not present

## 2018-07-04 DIAGNOSIS — I25118 Atherosclerotic heart disease of native coronary artery with other forms of angina pectoris: Secondary | ICD-10-CM | POA: Diagnosis not present

## 2018-07-06 DIAGNOSIS — I69891 Dysphagia following other cerebrovascular disease: Secondary | ICD-10-CM | POA: Diagnosis not present

## 2018-07-06 DIAGNOSIS — I25118 Atherosclerotic heart disease of native coronary artery with other forms of angina pectoris: Secondary | ICD-10-CM | POA: Diagnosis not present

## 2018-07-06 DIAGNOSIS — I480 Paroxysmal atrial fibrillation: Secondary | ICD-10-CM | POA: Diagnosis not present

## 2018-07-06 DIAGNOSIS — I5033 Acute on chronic diastolic (congestive) heart failure: Secondary | ICD-10-CM | POA: Diagnosis not present

## 2018-07-06 DIAGNOSIS — I110 Hypertensive heart disease with heart failure: Secondary | ICD-10-CM | POA: Diagnosis not present

## 2018-07-06 DIAGNOSIS — I69818 Other symptoms and signs involving cognitive functions following other cerebrovascular disease: Secondary | ICD-10-CM | POA: Diagnosis not present

## 2018-07-07 ENCOUNTER — Other Ambulatory Visit: Payer: Self-pay | Admitting: Family Medicine

## 2018-07-07 DIAGNOSIS — I480 Paroxysmal atrial fibrillation: Secondary | ICD-10-CM | POA: Diagnosis not present

## 2018-07-07 DIAGNOSIS — I5033 Acute on chronic diastolic (congestive) heart failure: Secondary | ICD-10-CM | POA: Diagnosis not present

## 2018-07-07 DIAGNOSIS — I69818 Other symptoms and signs involving cognitive functions following other cerebrovascular disease: Secondary | ICD-10-CM | POA: Diagnosis not present

## 2018-07-07 DIAGNOSIS — I69891 Dysphagia following other cerebrovascular disease: Secondary | ICD-10-CM | POA: Diagnosis not present

## 2018-07-07 DIAGNOSIS — I110 Hypertensive heart disease with heart failure: Secondary | ICD-10-CM | POA: Diagnosis not present

## 2018-07-07 DIAGNOSIS — I25118 Atherosclerotic heart disease of native coronary artery with other forms of angina pectoris: Secondary | ICD-10-CM | POA: Diagnosis not present

## 2018-07-09 DIAGNOSIS — I480 Paroxysmal atrial fibrillation: Secondary | ICD-10-CM | POA: Diagnosis not present

## 2018-07-09 DIAGNOSIS — I69891 Dysphagia following other cerebrovascular disease: Secondary | ICD-10-CM | POA: Diagnosis not present

## 2018-07-09 DIAGNOSIS — I25118 Atherosclerotic heart disease of native coronary artery with other forms of angina pectoris: Secondary | ICD-10-CM | POA: Diagnosis not present

## 2018-07-09 DIAGNOSIS — I5033 Acute on chronic diastolic (congestive) heart failure: Secondary | ICD-10-CM | POA: Diagnosis not present

## 2018-07-09 DIAGNOSIS — I110 Hypertensive heart disease with heart failure: Secondary | ICD-10-CM | POA: Diagnosis not present

## 2018-07-09 DIAGNOSIS — I69818 Other symptoms and signs involving cognitive functions following other cerebrovascular disease: Secondary | ICD-10-CM | POA: Diagnosis not present

## 2018-07-10 DIAGNOSIS — Z7401 Bed confinement status: Secondary | ICD-10-CM | POA: Diagnosis not present

## 2018-07-10 DIAGNOSIS — E785 Hyperlipidemia, unspecified: Secondary | ICD-10-CM | POA: Diagnosis not present

## 2018-07-10 DIAGNOSIS — L89322 Pressure ulcer of left buttock, stage 2: Secondary | ICD-10-CM | POA: Diagnosis not present

## 2018-07-10 DIAGNOSIS — R627 Adult failure to thrive: Secondary | ICD-10-CM | POA: Diagnosis not present

## 2018-07-10 DIAGNOSIS — Z681 Body mass index (BMI) 19 or less, adult: Secondary | ICD-10-CM | POA: Diagnosis not present

## 2018-07-10 DIAGNOSIS — I69891 Dysphagia following other cerebrovascular disease: Secondary | ICD-10-CM | POA: Diagnosis not present

## 2018-07-10 DIAGNOSIS — R32 Unspecified urinary incontinence: Secondary | ICD-10-CM | POA: Diagnosis not present

## 2018-07-10 DIAGNOSIS — E039 Hypothyroidism, unspecified: Secondary | ICD-10-CM | POA: Diagnosis not present

## 2018-07-10 DIAGNOSIS — Z951 Presence of aortocoronary bypass graft: Secondary | ICD-10-CM | POA: Diagnosis not present

## 2018-07-10 DIAGNOSIS — I69818 Other symptoms and signs involving cognitive functions following other cerebrovascular disease: Secondary | ICD-10-CM | POA: Diagnosis not present

## 2018-07-10 DIAGNOSIS — L89312 Pressure ulcer of right buttock, stage 2: Secondary | ICD-10-CM | POA: Diagnosis not present

## 2018-07-10 DIAGNOSIS — I25118 Atherosclerotic heart disease of native coronary artery with other forms of angina pectoris: Secondary | ICD-10-CM | POA: Diagnosis not present

## 2018-07-10 DIAGNOSIS — Z466 Encounter for fitting and adjustment of urinary device: Secondary | ICD-10-CM | POA: Diagnosis not present

## 2018-07-10 DIAGNOSIS — D649 Anemia, unspecified: Secondary | ICD-10-CM | POA: Diagnosis not present

## 2018-07-10 DIAGNOSIS — I5033 Acute on chronic diastolic (congestive) heart failure: Secondary | ICD-10-CM | POA: Diagnosis not present

## 2018-07-10 DIAGNOSIS — Z9981 Dependence on supplemental oxygen: Secondary | ICD-10-CM | POA: Diagnosis not present

## 2018-07-10 DIAGNOSIS — R159 Full incontinence of feces: Secondary | ICD-10-CM | POA: Diagnosis not present

## 2018-07-10 DIAGNOSIS — I110 Hypertensive heart disease with heart failure: Secondary | ICD-10-CM | POA: Diagnosis not present

## 2018-07-10 DIAGNOSIS — I480 Paroxysmal atrial fibrillation: Secondary | ICD-10-CM | POA: Diagnosis not present

## 2018-07-10 DIAGNOSIS — K219 Gastro-esophageal reflux disease without esophagitis: Secondary | ICD-10-CM | POA: Diagnosis not present

## 2018-07-13 DIAGNOSIS — I110 Hypertensive heart disease with heart failure: Secondary | ICD-10-CM | POA: Diagnosis not present

## 2018-07-13 DIAGNOSIS — I25118 Atherosclerotic heart disease of native coronary artery with other forms of angina pectoris: Secondary | ICD-10-CM | POA: Diagnosis not present

## 2018-07-13 DIAGNOSIS — I69891 Dysphagia following other cerebrovascular disease: Secondary | ICD-10-CM | POA: Diagnosis not present

## 2018-07-13 DIAGNOSIS — I480 Paroxysmal atrial fibrillation: Secondary | ICD-10-CM | POA: Diagnosis not present

## 2018-07-13 DIAGNOSIS — I5033 Acute on chronic diastolic (congestive) heart failure: Secondary | ICD-10-CM | POA: Diagnosis not present

## 2018-07-13 DIAGNOSIS — I69818 Other symptoms and signs involving cognitive functions following other cerebrovascular disease: Secondary | ICD-10-CM | POA: Diagnosis not present

## 2018-07-14 DIAGNOSIS — I25118 Atherosclerotic heart disease of native coronary artery with other forms of angina pectoris: Secondary | ICD-10-CM | POA: Diagnosis not present

## 2018-07-14 DIAGNOSIS — I5033 Acute on chronic diastolic (congestive) heart failure: Secondary | ICD-10-CM | POA: Diagnosis not present

## 2018-07-14 DIAGNOSIS — I480 Paroxysmal atrial fibrillation: Secondary | ICD-10-CM | POA: Diagnosis not present

## 2018-07-14 DIAGNOSIS — I110 Hypertensive heart disease with heart failure: Secondary | ICD-10-CM | POA: Diagnosis not present

## 2018-07-14 DIAGNOSIS — I69891 Dysphagia following other cerebrovascular disease: Secondary | ICD-10-CM | POA: Diagnosis not present

## 2018-07-14 DIAGNOSIS — I69818 Other symptoms and signs involving cognitive functions following other cerebrovascular disease: Secondary | ICD-10-CM | POA: Diagnosis not present

## 2018-07-15 DIAGNOSIS — I480 Paroxysmal atrial fibrillation: Secondary | ICD-10-CM | POA: Diagnosis not present

## 2018-07-15 DIAGNOSIS — I69818 Other symptoms and signs involving cognitive functions following other cerebrovascular disease: Secondary | ICD-10-CM | POA: Diagnosis not present

## 2018-07-15 DIAGNOSIS — I69891 Dysphagia following other cerebrovascular disease: Secondary | ICD-10-CM | POA: Diagnosis not present

## 2018-07-15 DIAGNOSIS — I5033 Acute on chronic diastolic (congestive) heart failure: Secondary | ICD-10-CM | POA: Diagnosis not present

## 2018-07-15 DIAGNOSIS — I110 Hypertensive heart disease with heart failure: Secondary | ICD-10-CM | POA: Diagnosis not present

## 2018-07-15 DIAGNOSIS — I25118 Atherosclerotic heart disease of native coronary artery with other forms of angina pectoris: Secondary | ICD-10-CM | POA: Diagnosis not present

## 2018-07-16 DIAGNOSIS — I480 Paroxysmal atrial fibrillation: Secondary | ICD-10-CM | POA: Diagnosis not present

## 2018-07-16 DIAGNOSIS — I110 Hypertensive heart disease with heart failure: Secondary | ICD-10-CM | POA: Diagnosis not present

## 2018-07-16 DIAGNOSIS — I69891 Dysphagia following other cerebrovascular disease: Secondary | ICD-10-CM | POA: Diagnosis not present

## 2018-07-16 DIAGNOSIS — I69818 Other symptoms and signs involving cognitive functions following other cerebrovascular disease: Secondary | ICD-10-CM | POA: Diagnosis not present

## 2018-07-16 DIAGNOSIS — I5033 Acute on chronic diastolic (congestive) heart failure: Secondary | ICD-10-CM | POA: Diagnosis not present

## 2018-07-16 DIAGNOSIS — I25118 Atherosclerotic heart disease of native coronary artery with other forms of angina pectoris: Secondary | ICD-10-CM | POA: Diagnosis not present

## 2018-07-20 ENCOUNTER — Other Ambulatory Visit: Payer: Self-pay | Admitting: Family Medicine

## 2018-07-20 DIAGNOSIS — I110 Hypertensive heart disease with heart failure: Secondary | ICD-10-CM | POA: Diagnosis not present

## 2018-07-20 DIAGNOSIS — I5033 Acute on chronic diastolic (congestive) heart failure: Secondary | ICD-10-CM | POA: Diagnosis not present

## 2018-07-20 DIAGNOSIS — I69818 Other symptoms and signs involving cognitive functions following other cerebrovascular disease: Secondary | ICD-10-CM | POA: Diagnosis not present

## 2018-07-20 DIAGNOSIS — I480 Paroxysmal atrial fibrillation: Secondary | ICD-10-CM | POA: Diagnosis not present

## 2018-07-20 DIAGNOSIS — I25118 Atherosclerotic heart disease of native coronary artery with other forms of angina pectoris: Secondary | ICD-10-CM | POA: Diagnosis not present

## 2018-07-20 DIAGNOSIS — I69891 Dysphagia following other cerebrovascular disease: Secondary | ICD-10-CM | POA: Diagnosis not present

## 2018-07-23 DIAGNOSIS — I5033 Acute on chronic diastolic (congestive) heart failure: Secondary | ICD-10-CM | POA: Diagnosis not present

## 2018-07-23 DIAGNOSIS — I480 Paroxysmal atrial fibrillation: Secondary | ICD-10-CM | POA: Diagnosis not present

## 2018-07-23 DIAGNOSIS — I25118 Atherosclerotic heart disease of native coronary artery with other forms of angina pectoris: Secondary | ICD-10-CM | POA: Diagnosis not present

## 2018-07-23 DIAGNOSIS — I110 Hypertensive heart disease with heart failure: Secondary | ICD-10-CM | POA: Diagnosis not present

## 2018-07-23 DIAGNOSIS — I69818 Other symptoms and signs involving cognitive functions following other cerebrovascular disease: Secondary | ICD-10-CM | POA: Diagnosis not present

## 2018-07-23 DIAGNOSIS — I69891 Dysphagia following other cerebrovascular disease: Secondary | ICD-10-CM | POA: Diagnosis not present

## 2018-07-24 DIAGNOSIS — I25118 Atherosclerotic heart disease of native coronary artery with other forms of angina pectoris: Secondary | ICD-10-CM | POA: Diagnosis not present

## 2018-07-24 DIAGNOSIS — I480 Paroxysmal atrial fibrillation: Secondary | ICD-10-CM | POA: Diagnosis not present

## 2018-07-24 DIAGNOSIS — I110 Hypertensive heart disease with heart failure: Secondary | ICD-10-CM | POA: Diagnosis not present

## 2018-07-24 DIAGNOSIS — I5033 Acute on chronic diastolic (congestive) heart failure: Secondary | ICD-10-CM | POA: Diagnosis not present

## 2018-07-24 DIAGNOSIS — I69818 Other symptoms and signs involving cognitive functions following other cerebrovascular disease: Secondary | ICD-10-CM | POA: Diagnosis not present

## 2018-07-24 DIAGNOSIS — I69891 Dysphagia following other cerebrovascular disease: Secondary | ICD-10-CM | POA: Diagnosis not present

## 2018-07-26 DIAGNOSIS — I480 Paroxysmal atrial fibrillation: Secondary | ICD-10-CM | POA: Diagnosis not present

## 2018-07-26 DIAGNOSIS — I110 Hypertensive heart disease with heart failure: Secondary | ICD-10-CM | POA: Diagnosis not present

## 2018-07-26 DIAGNOSIS — I5033 Acute on chronic diastolic (congestive) heart failure: Secondary | ICD-10-CM | POA: Diagnosis not present

## 2018-07-26 DIAGNOSIS — I25118 Atherosclerotic heart disease of native coronary artery with other forms of angina pectoris: Secondary | ICD-10-CM | POA: Diagnosis not present

## 2018-07-26 DIAGNOSIS — I69891 Dysphagia following other cerebrovascular disease: Secondary | ICD-10-CM | POA: Diagnosis not present

## 2018-07-26 DIAGNOSIS — I69818 Other symptoms and signs involving cognitive functions following other cerebrovascular disease: Secondary | ICD-10-CM | POA: Diagnosis not present

## 2018-07-27 DIAGNOSIS — I5033 Acute on chronic diastolic (congestive) heart failure: Secondary | ICD-10-CM | POA: Diagnosis not present

## 2018-07-27 DIAGNOSIS — I69891 Dysphagia following other cerebrovascular disease: Secondary | ICD-10-CM | POA: Diagnosis not present

## 2018-07-27 DIAGNOSIS — I69818 Other symptoms and signs involving cognitive functions following other cerebrovascular disease: Secondary | ICD-10-CM | POA: Diagnosis not present

## 2018-07-27 DIAGNOSIS — I480 Paroxysmal atrial fibrillation: Secondary | ICD-10-CM | POA: Diagnosis not present

## 2018-07-27 DIAGNOSIS — I25118 Atherosclerotic heart disease of native coronary artery with other forms of angina pectoris: Secondary | ICD-10-CM | POA: Diagnosis not present

## 2018-07-27 DIAGNOSIS — I110 Hypertensive heart disease with heart failure: Secondary | ICD-10-CM | POA: Diagnosis not present

## 2018-07-30 DIAGNOSIS — I69891 Dysphagia following other cerebrovascular disease: Secondary | ICD-10-CM | POA: Diagnosis not present

## 2018-07-30 DIAGNOSIS — I480 Paroxysmal atrial fibrillation: Secondary | ICD-10-CM | POA: Diagnosis not present

## 2018-07-30 DIAGNOSIS — I25118 Atherosclerotic heart disease of native coronary artery with other forms of angina pectoris: Secondary | ICD-10-CM | POA: Diagnosis not present

## 2018-07-30 DIAGNOSIS — I69818 Other symptoms and signs involving cognitive functions following other cerebrovascular disease: Secondary | ICD-10-CM | POA: Diagnosis not present

## 2018-07-30 DIAGNOSIS — I110 Hypertensive heart disease with heart failure: Secondary | ICD-10-CM | POA: Diagnosis not present

## 2018-07-30 DIAGNOSIS — I5033 Acute on chronic diastolic (congestive) heart failure: Secondary | ICD-10-CM | POA: Diagnosis not present

## 2018-07-31 DIAGNOSIS — I5033 Acute on chronic diastolic (congestive) heart failure: Secondary | ICD-10-CM | POA: Diagnosis not present

## 2018-07-31 DIAGNOSIS — I480 Paroxysmal atrial fibrillation: Secondary | ICD-10-CM | POA: Diagnosis not present

## 2018-07-31 DIAGNOSIS — I110 Hypertensive heart disease with heart failure: Secondary | ICD-10-CM | POA: Diagnosis not present

## 2018-07-31 DIAGNOSIS — I69891 Dysphagia following other cerebrovascular disease: Secondary | ICD-10-CM | POA: Diagnosis not present

## 2018-07-31 DIAGNOSIS — I25118 Atherosclerotic heart disease of native coronary artery with other forms of angina pectoris: Secondary | ICD-10-CM | POA: Diagnosis not present

## 2018-07-31 DIAGNOSIS — I69818 Other symptoms and signs involving cognitive functions following other cerebrovascular disease: Secondary | ICD-10-CM | POA: Diagnosis not present

## 2018-08-03 DIAGNOSIS — I25118 Atherosclerotic heart disease of native coronary artery with other forms of angina pectoris: Secondary | ICD-10-CM | POA: Diagnosis not present

## 2018-08-03 DIAGNOSIS — I5033 Acute on chronic diastolic (congestive) heart failure: Secondary | ICD-10-CM | POA: Diagnosis not present

## 2018-08-03 DIAGNOSIS — I480 Paroxysmal atrial fibrillation: Secondary | ICD-10-CM | POA: Diagnosis not present

## 2018-08-03 DIAGNOSIS — I69891 Dysphagia following other cerebrovascular disease: Secondary | ICD-10-CM | POA: Diagnosis not present

## 2018-08-03 DIAGNOSIS — I110 Hypertensive heart disease with heart failure: Secondary | ICD-10-CM | POA: Diagnosis not present

## 2018-08-03 DIAGNOSIS — I69818 Other symptoms and signs involving cognitive functions following other cerebrovascular disease: Secondary | ICD-10-CM | POA: Diagnosis not present

## 2018-08-05 ENCOUNTER — Other Ambulatory Visit: Payer: Self-pay | Admitting: Family Medicine

## 2018-08-05 DIAGNOSIS — I25118 Atherosclerotic heart disease of native coronary artery with other forms of angina pectoris: Secondary | ICD-10-CM | POA: Diagnosis not present

## 2018-08-05 DIAGNOSIS — I69818 Other symptoms and signs involving cognitive functions following other cerebrovascular disease: Secondary | ICD-10-CM | POA: Diagnosis not present

## 2018-08-05 DIAGNOSIS — I480 Paroxysmal atrial fibrillation: Secondary | ICD-10-CM | POA: Diagnosis not present

## 2018-08-05 DIAGNOSIS — I110 Hypertensive heart disease with heart failure: Secondary | ICD-10-CM | POA: Diagnosis not present

## 2018-08-05 DIAGNOSIS — I5033 Acute on chronic diastolic (congestive) heart failure: Secondary | ICD-10-CM | POA: Diagnosis not present

## 2018-08-05 DIAGNOSIS — I69891 Dysphagia following other cerebrovascular disease: Secondary | ICD-10-CM | POA: Diagnosis not present

## 2018-08-09 DIAGNOSIS — R32 Unspecified urinary incontinence: Secondary | ICD-10-CM | POA: Diagnosis not present

## 2018-08-09 DIAGNOSIS — K219 Gastro-esophageal reflux disease without esophagitis: Secondary | ICD-10-CM | POA: Diagnosis not present

## 2018-08-09 DIAGNOSIS — Z951 Presence of aortocoronary bypass graft: Secondary | ICD-10-CM | POA: Diagnosis not present

## 2018-08-09 DIAGNOSIS — I69818 Other symptoms and signs involving cognitive functions following other cerebrovascular disease: Secondary | ICD-10-CM | POA: Diagnosis not present

## 2018-08-09 DIAGNOSIS — R159 Full incontinence of feces: Secondary | ICD-10-CM | POA: Diagnosis not present

## 2018-08-09 DIAGNOSIS — I480 Paroxysmal atrial fibrillation: Secondary | ICD-10-CM | POA: Diagnosis not present

## 2018-08-09 DIAGNOSIS — Z681 Body mass index (BMI) 19 or less, adult: Secondary | ICD-10-CM | POA: Diagnosis not present

## 2018-08-09 DIAGNOSIS — I69891 Dysphagia following other cerebrovascular disease: Secondary | ICD-10-CM | POA: Diagnosis not present

## 2018-08-09 DIAGNOSIS — Z7401 Bed confinement status: Secondary | ICD-10-CM | POA: Diagnosis not present

## 2018-08-09 DIAGNOSIS — R627 Adult failure to thrive: Secondary | ICD-10-CM | POA: Diagnosis not present

## 2018-08-09 DIAGNOSIS — I110 Hypertensive heart disease with heart failure: Secondary | ICD-10-CM | POA: Diagnosis not present

## 2018-08-09 DIAGNOSIS — E039 Hypothyroidism, unspecified: Secondary | ICD-10-CM | POA: Diagnosis not present

## 2018-08-09 DIAGNOSIS — E785 Hyperlipidemia, unspecified: Secondary | ICD-10-CM | POA: Diagnosis not present

## 2018-08-09 DIAGNOSIS — D649 Anemia, unspecified: Secondary | ICD-10-CM | POA: Diagnosis not present

## 2018-08-09 DIAGNOSIS — I25118 Atherosclerotic heart disease of native coronary artery with other forms of angina pectoris: Secondary | ICD-10-CM | POA: Diagnosis not present

## 2018-08-09 DIAGNOSIS — Z9981 Dependence on supplemental oxygen: Secondary | ICD-10-CM | POA: Diagnosis not present

## 2018-08-09 DIAGNOSIS — I5033 Acute on chronic diastolic (congestive) heart failure: Secondary | ICD-10-CM | POA: Diagnosis not present

## 2018-08-09 DIAGNOSIS — Z466 Encounter for fitting and adjustment of urinary device: Secondary | ICD-10-CM | POA: Diagnosis not present

## 2018-08-10 DIAGNOSIS — I110 Hypertensive heart disease with heart failure: Secondary | ICD-10-CM | POA: Diagnosis not present

## 2018-08-10 DIAGNOSIS — I25118 Atherosclerotic heart disease of native coronary artery with other forms of angina pectoris: Secondary | ICD-10-CM | POA: Diagnosis not present

## 2018-08-10 DIAGNOSIS — I5033 Acute on chronic diastolic (congestive) heart failure: Secondary | ICD-10-CM | POA: Diagnosis not present

## 2018-08-10 DIAGNOSIS — I69818 Other symptoms and signs involving cognitive functions following other cerebrovascular disease: Secondary | ICD-10-CM | POA: Diagnosis not present

## 2018-08-10 DIAGNOSIS — I69891 Dysphagia following other cerebrovascular disease: Secondary | ICD-10-CM | POA: Diagnosis not present

## 2018-08-10 DIAGNOSIS — I480 Paroxysmal atrial fibrillation: Secondary | ICD-10-CM | POA: Diagnosis not present

## 2018-08-13 DIAGNOSIS — I69891 Dysphagia following other cerebrovascular disease: Secondary | ICD-10-CM | POA: Diagnosis not present

## 2018-08-13 DIAGNOSIS — I69818 Other symptoms and signs involving cognitive functions following other cerebrovascular disease: Secondary | ICD-10-CM | POA: Diagnosis not present

## 2018-08-13 DIAGNOSIS — I480 Paroxysmal atrial fibrillation: Secondary | ICD-10-CM | POA: Diagnosis not present

## 2018-08-13 DIAGNOSIS — I25118 Atherosclerotic heart disease of native coronary artery with other forms of angina pectoris: Secondary | ICD-10-CM | POA: Diagnosis not present

## 2018-08-13 DIAGNOSIS — I110 Hypertensive heart disease with heart failure: Secondary | ICD-10-CM | POA: Diagnosis not present

## 2018-08-13 DIAGNOSIS — I5033 Acute on chronic diastolic (congestive) heart failure: Secondary | ICD-10-CM | POA: Diagnosis not present

## 2018-08-17 DIAGNOSIS — I69818 Other symptoms and signs involving cognitive functions following other cerebrovascular disease: Secondary | ICD-10-CM | POA: Diagnosis not present

## 2018-08-17 DIAGNOSIS — I110 Hypertensive heart disease with heart failure: Secondary | ICD-10-CM | POA: Diagnosis not present

## 2018-08-17 DIAGNOSIS — I25118 Atherosclerotic heart disease of native coronary artery with other forms of angina pectoris: Secondary | ICD-10-CM | POA: Diagnosis not present

## 2018-08-17 DIAGNOSIS — I5033 Acute on chronic diastolic (congestive) heart failure: Secondary | ICD-10-CM | POA: Diagnosis not present

## 2018-08-17 DIAGNOSIS — I480 Paroxysmal atrial fibrillation: Secondary | ICD-10-CM | POA: Diagnosis not present

## 2018-08-17 DIAGNOSIS — I69891 Dysphagia following other cerebrovascular disease: Secondary | ICD-10-CM | POA: Diagnosis not present

## 2018-08-20 DIAGNOSIS — I25118 Atherosclerotic heart disease of native coronary artery with other forms of angina pectoris: Secondary | ICD-10-CM | POA: Diagnosis not present

## 2018-08-20 DIAGNOSIS — I110 Hypertensive heart disease with heart failure: Secondary | ICD-10-CM | POA: Diagnosis not present

## 2018-08-20 DIAGNOSIS — I5033 Acute on chronic diastolic (congestive) heart failure: Secondary | ICD-10-CM | POA: Diagnosis not present

## 2018-08-20 DIAGNOSIS — I480 Paroxysmal atrial fibrillation: Secondary | ICD-10-CM | POA: Diagnosis not present

## 2018-08-20 DIAGNOSIS — I69818 Other symptoms and signs involving cognitive functions following other cerebrovascular disease: Secondary | ICD-10-CM | POA: Diagnosis not present

## 2018-08-20 DIAGNOSIS — I69891 Dysphagia following other cerebrovascular disease: Secondary | ICD-10-CM | POA: Diagnosis not present

## 2018-08-24 DIAGNOSIS — I69891 Dysphagia following other cerebrovascular disease: Secondary | ICD-10-CM | POA: Diagnosis not present

## 2018-08-24 DIAGNOSIS — I69818 Other symptoms and signs involving cognitive functions following other cerebrovascular disease: Secondary | ICD-10-CM | POA: Diagnosis not present

## 2018-08-24 DIAGNOSIS — I480 Paroxysmal atrial fibrillation: Secondary | ICD-10-CM | POA: Diagnosis not present

## 2018-08-24 DIAGNOSIS — I5033 Acute on chronic diastolic (congestive) heart failure: Secondary | ICD-10-CM | POA: Diagnosis not present

## 2018-08-24 DIAGNOSIS — I110 Hypertensive heart disease with heart failure: Secondary | ICD-10-CM | POA: Diagnosis not present

## 2018-08-24 DIAGNOSIS — I25118 Atherosclerotic heart disease of native coronary artery with other forms of angina pectoris: Secondary | ICD-10-CM | POA: Diagnosis not present

## 2018-08-25 DIAGNOSIS — I69891 Dysphagia following other cerebrovascular disease: Secondary | ICD-10-CM | POA: Diagnosis not present

## 2018-08-25 DIAGNOSIS — I25118 Atherosclerotic heart disease of native coronary artery with other forms of angina pectoris: Secondary | ICD-10-CM | POA: Diagnosis not present

## 2018-08-25 DIAGNOSIS — I480 Paroxysmal atrial fibrillation: Secondary | ICD-10-CM | POA: Diagnosis not present

## 2018-08-25 DIAGNOSIS — I69818 Other symptoms and signs involving cognitive functions following other cerebrovascular disease: Secondary | ICD-10-CM | POA: Diagnosis not present

## 2018-08-25 DIAGNOSIS — I5033 Acute on chronic diastolic (congestive) heart failure: Secondary | ICD-10-CM | POA: Diagnosis not present

## 2018-08-25 DIAGNOSIS — I110 Hypertensive heart disease with heart failure: Secondary | ICD-10-CM | POA: Diagnosis not present

## 2018-08-27 DIAGNOSIS — I69818 Other symptoms and signs involving cognitive functions following other cerebrovascular disease: Secondary | ICD-10-CM | POA: Diagnosis not present

## 2018-08-27 DIAGNOSIS — I5033 Acute on chronic diastolic (congestive) heart failure: Secondary | ICD-10-CM | POA: Diagnosis not present

## 2018-08-27 DIAGNOSIS — I110 Hypertensive heart disease with heart failure: Secondary | ICD-10-CM | POA: Diagnosis not present

## 2018-08-27 DIAGNOSIS — I480 Paroxysmal atrial fibrillation: Secondary | ICD-10-CM | POA: Diagnosis not present

## 2018-08-27 DIAGNOSIS — I25118 Atherosclerotic heart disease of native coronary artery with other forms of angina pectoris: Secondary | ICD-10-CM | POA: Diagnosis not present

## 2018-08-27 DIAGNOSIS — I69891 Dysphagia following other cerebrovascular disease: Secondary | ICD-10-CM | POA: Diagnosis not present

## 2018-08-31 DIAGNOSIS — I5033 Acute on chronic diastolic (congestive) heart failure: Secondary | ICD-10-CM | POA: Diagnosis not present

## 2018-08-31 DIAGNOSIS — I69891 Dysphagia following other cerebrovascular disease: Secondary | ICD-10-CM | POA: Diagnosis not present

## 2018-08-31 DIAGNOSIS — I25118 Atherosclerotic heart disease of native coronary artery with other forms of angina pectoris: Secondary | ICD-10-CM | POA: Diagnosis not present

## 2018-08-31 DIAGNOSIS — I480 Paroxysmal atrial fibrillation: Secondary | ICD-10-CM | POA: Diagnosis not present

## 2018-08-31 DIAGNOSIS — I69818 Other symptoms and signs involving cognitive functions following other cerebrovascular disease: Secondary | ICD-10-CM | POA: Diagnosis not present

## 2018-08-31 DIAGNOSIS — I110 Hypertensive heart disease with heart failure: Secondary | ICD-10-CM | POA: Diagnosis not present

## 2018-09-03 DIAGNOSIS — I69818 Other symptoms and signs involving cognitive functions following other cerebrovascular disease: Secondary | ICD-10-CM | POA: Diagnosis not present

## 2018-09-03 DIAGNOSIS — I25118 Atherosclerotic heart disease of native coronary artery with other forms of angina pectoris: Secondary | ICD-10-CM | POA: Diagnosis not present

## 2018-09-03 DIAGNOSIS — I69891 Dysphagia following other cerebrovascular disease: Secondary | ICD-10-CM | POA: Diagnosis not present

## 2018-09-03 DIAGNOSIS — I5033 Acute on chronic diastolic (congestive) heart failure: Secondary | ICD-10-CM | POA: Diagnosis not present

## 2018-09-03 DIAGNOSIS — I110 Hypertensive heart disease with heart failure: Secondary | ICD-10-CM | POA: Diagnosis not present

## 2018-09-03 DIAGNOSIS — I480 Paroxysmal atrial fibrillation: Secondary | ICD-10-CM | POA: Diagnosis not present

## 2018-09-04 DIAGNOSIS — I480 Paroxysmal atrial fibrillation: Secondary | ICD-10-CM | POA: Diagnosis not present

## 2018-09-04 DIAGNOSIS — I110 Hypertensive heart disease with heart failure: Secondary | ICD-10-CM | POA: Diagnosis not present

## 2018-09-04 DIAGNOSIS — I69891 Dysphagia following other cerebrovascular disease: Secondary | ICD-10-CM | POA: Diagnosis not present

## 2018-09-04 DIAGNOSIS — I69818 Other symptoms and signs involving cognitive functions following other cerebrovascular disease: Secondary | ICD-10-CM | POA: Diagnosis not present

## 2018-09-04 DIAGNOSIS — I25118 Atherosclerotic heart disease of native coronary artery with other forms of angina pectoris: Secondary | ICD-10-CM | POA: Diagnosis not present

## 2018-09-04 DIAGNOSIS — I5033 Acute on chronic diastolic (congestive) heart failure: Secondary | ICD-10-CM | POA: Diagnosis not present

## 2018-09-07 DIAGNOSIS — I480 Paroxysmal atrial fibrillation: Secondary | ICD-10-CM | POA: Diagnosis not present

## 2018-09-07 DIAGNOSIS — I5033 Acute on chronic diastolic (congestive) heart failure: Secondary | ICD-10-CM | POA: Diagnosis not present

## 2018-09-07 DIAGNOSIS — I110 Hypertensive heart disease with heart failure: Secondary | ICD-10-CM | POA: Diagnosis not present

## 2018-09-07 DIAGNOSIS — I25118 Atherosclerotic heart disease of native coronary artery with other forms of angina pectoris: Secondary | ICD-10-CM | POA: Diagnosis not present

## 2018-09-07 DIAGNOSIS — I69891 Dysphagia following other cerebrovascular disease: Secondary | ICD-10-CM | POA: Diagnosis not present

## 2018-09-07 DIAGNOSIS — I69818 Other symptoms and signs involving cognitive functions following other cerebrovascular disease: Secondary | ICD-10-CM | POA: Diagnosis not present

## 2018-09-09 DIAGNOSIS — R32 Unspecified urinary incontinence: Secondary | ICD-10-CM | POA: Diagnosis not present

## 2018-09-09 DIAGNOSIS — Z681 Body mass index (BMI) 19 or less, adult: Secondary | ICD-10-CM | POA: Diagnosis not present

## 2018-09-09 DIAGNOSIS — R627 Adult failure to thrive: Secondary | ICD-10-CM | POA: Diagnosis not present

## 2018-09-09 DIAGNOSIS — Z466 Encounter for fitting and adjustment of urinary device: Secondary | ICD-10-CM | POA: Diagnosis not present

## 2018-09-09 DIAGNOSIS — K219 Gastro-esophageal reflux disease without esophagitis: Secondary | ICD-10-CM | POA: Diagnosis not present

## 2018-09-09 DIAGNOSIS — I69818 Other symptoms and signs involving cognitive functions following other cerebrovascular disease: Secondary | ICD-10-CM | POA: Diagnosis not present

## 2018-09-09 DIAGNOSIS — E785 Hyperlipidemia, unspecified: Secondary | ICD-10-CM | POA: Diagnosis not present

## 2018-09-09 DIAGNOSIS — Z87440 Personal history of urinary (tract) infections: Secondary | ICD-10-CM | POA: Diagnosis not present

## 2018-09-09 DIAGNOSIS — I69891 Dysphagia following other cerebrovascular disease: Secondary | ICD-10-CM | POA: Diagnosis not present

## 2018-09-09 DIAGNOSIS — Z7401 Bed confinement status: Secondary | ICD-10-CM | POA: Diagnosis not present

## 2018-09-09 DIAGNOSIS — I25118 Atherosclerotic heart disease of native coronary artery with other forms of angina pectoris: Secondary | ICD-10-CM | POA: Diagnosis not present

## 2018-09-09 DIAGNOSIS — Z9981 Dependence on supplemental oxygen: Secondary | ICD-10-CM | POA: Diagnosis not present

## 2018-09-09 DIAGNOSIS — E039 Hypothyroidism, unspecified: Secondary | ICD-10-CM | POA: Diagnosis not present

## 2018-09-09 DIAGNOSIS — D649 Anemia, unspecified: Secondary | ICD-10-CM | POA: Diagnosis not present

## 2018-09-09 DIAGNOSIS — I5033 Acute on chronic diastolic (congestive) heart failure: Secondary | ICD-10-CM | POA: Diagnosis not present

## 2018-09-09 DIAGNOSIS — R159 Full incontinence of feces: Secondary | ICD-10-CM | POA: Diagnosis not present

## 2018-09-09 DIAGNOSIS — Z951 Presence of aortocoronary bypass graft: Secondary | ICD-10-CM | POA: Diagnosis not present

## 2018-09-09 DIAGNOSIS — I110 Hypertensive heart disease with heart failure: Secondary | ICD-10-CM | POA: Diagnosis not present

## 2018-09-09 DIAGNOSIS — I480 Paroxysmal atrial fibrillation: Secondary | ICD-10-CM | POA: Diagnosis not present

## 2018-09-10 DIAGNOSIS — I480 Paroxysmal atrial fibrillation: Secondary | ICD-10-CM | POA: Diagnosis not present

## 2018-09-10 DIAGNOSIS — E785 Hyperlipidemia, unspecified: Secondary | ICD-10-CM | POA: Diagnosis not present

## 2018-09-10 DIAGNOSIS — I25118 Atherosclerotic heart disease of native coronary artery with other forms of angina pectoris: Secondary | ICD-10-CM | POA: Diagnosis not present

## 2018-09-10 DIAGNOSIS — I110 Hypertensive heart disease with heart failure: Secondary | ICD-10-CM | POA: Diagnosis not present

## 2018-09-10 DIAGNOSIS — D649 Anemia, unspecified: Secondary | ICD-10-CM | POA: Diagnosis not present

## 2018-09-10 DIAGNOSIS — I5033 Acute on chronic diastolic (congestive) heart failure: Secondary | ICD-10-CM | POA: Diagnosis not present

## 2018-09-13 DIAGNOSIS — D649 Anemia, unspecified: Secondary | ICD-10-CM | POA: Diagnosis not present

## 2018-09-13 DIAGNOSIS — I5033 Acute on chronic diastolic (congestive) heart failure: Secondary | ICD-10-CM | POA: Diagnosis not present

## 2018-09-13 DIAGNOSIS — I480 Paroxysmal atrial fibrillation: Secondary | ICD-10-CM | POA: Diagnosis not present

## 2018-09-13 DIAGNOSIS — I25118 Atherosclerotic heart disease of native coronary artery with other forms of angina pectoris: Secondary | ICD-10-CM | POA: Diagnosis not present

## 2018-09-13 DIAGNOSIS — E785 Hyperlipidemia, unspecified: Secondary | ICD-10-CM | POA: Diagnosis not present

## 2018-09-13 DIAGNOSIS — I110 Hypertensive heart disease with heart failure: Secondary | ICD-10-CM | POA: Diagnosis not present

## 2018-09-14 DIAGNOSIS — I480 Paroxysmal atrial fibrillation: Secondary | ICD-10-CM | POA: Diagnosis not present

## 2018-09-14 DIAGNOSIS — I110 Hypertensive heart disease with heart failure: Secondary | ICD-10-CM | POA: Diagnosis not present

## 2018-09-14 DIAGNOSIS — E785 Hyperlipidemia, unspecified: Secondary | ICD-10-CM | POA: Diagnosis not present

## 2018-09-14 DIAGNOSIS — I5033 Acute on chronic diastolic (congestive) heart failure: Secondary | ICD-10-CM | POA: Diagnosis not present

## 2018-09-14 DIAGNOSIS — D649 Anemia, unspecified: Secondary | ICD-10-CM | POA: Diagnosis not present

## 2018-09-14 DIAGNOSIS — I25118 Atherosclerotic heart disease of native coronary artery with other forms of angina pectoris: Secondary | ICD-10-CM | POA: Diagnosis not present

## 2018-09-17 DIAGNOSIS — I25118 Atherosclerotic heart disease of native coronary artery with other forms of angina pectoris: Secondary | ICD-10-CM | POA: Diagnosis not present

## 2018-09-17 DIAGNOSIS — I480 Paroxysmal atrial fibrillation: Secondary | ICD-10-CM | POA: Diagnosis not present

## 2018-09-17 DIAGNOSIS — D649 Anemia, unspecified: Secondary | ICD-10-CM | POA: Diagnosis not present

## 2018-09-17 DIAGNOSIS — E785 Hyperlipidemia, unspecified: Secondary | ICD-10-CM | POA: Diagnosis not present

## 2018-09-17 DIAGNOSIS — I110 Hypertensive heart disease with heart failure: Secondary | ICD-10-CM | POA: Diagnosis not present

## 2018-09-17 DIAGNOSIS — I5033 Acute on chronic diastolic (congestive) heart failure: Secondary | ICD-10-CM | POA: Diagnosis not present

## 2018-09-21 DIAGNOSIS — E785 Hyperlipidemia, unspecified: Secondary | ICD-10-CM | POA: Diagnosis not present

## 2018-09-21 DIAGNOSIS — D649 Anemia, unspecified: Secondary | ICD-10-CM | POA: Diagnosis not present

## 2018-09-21 DIAGNOSIS — I5033 Acute on chronic diastolic (congestive) heart failure: Secondary | ICD-10-CM | POA: Diagnosis not present

## 2018-09-21 DIAGNOSIS — I25118 Atherosclerotic heart disease of native coronary artery with other forms of angina pectoris: Secondary | ICD-10-CM | POA: Diagnosis not present

## 2018-09-21 DIAGNOSIS — I110 Hypertensive heart disease with heart failure: Secondary | ICD-10-CM | POA: Diagnosis not present

## 2018-09-21 DIAGNOSIS — I480 Paroxysmal atrial fibrillation: Secondary | ICD-10-CM | POA: Diagnosis not present

## 2018-09-24 DIAGNOSIS — D649 Anemia, unspecified: Secondary | ICD-10-CM | POA: Diagnosis not present

## 2018-09-24 DIAGNOSIS — I25118 Atherosclerotic heart disease of native coronary artery with other forms of angina pectoris: Secondary | ICD-10-CM | POA: Diagnosis not present

## 2018-09-24 DIAGNOSIS — I110 Hypertensive heart disease with heart failure: Secondary | ICD-10-CM | POA: Diagnosis not present

## 2018-09-24 DIAGNOSIS — E785 Hyperlipidemia, unspecified: Secondary | ICD-10-CM | POA: Diagnosis not present

## 2018-09-24 DIAGNOSIS — I5033 Acute on chronic diastolic (congestive) heart failure: Secondary | ICD-10-CM | POA: Diagnosis not present

## 2018-09-24 DIAGNOSIS — I480 Paroxysmal atrial fibrillation: Secondary | ICD-10-CM | POA: Diagnosis not present

## 2018-09-25 DIAGNOSIS — I25118 Atherosclerotic heart disease of native coronary artery with other forms of angina pectoris: Secondary | ICD-10-CM | POA: Diagnosis not present

## 2018-09-25 DIAGNOSIS — E785 Hyperlipidemia, unspecified: Secondary | ICD-10-CM | POA: Diagnosis not present

## 2018-09-25 DIAGNOSIS — I5033 Acute on chronic diastolic (congestive) heart failure: Secondary | ICD-10-CM | POA: Diagnosis not present

## 2018-09-25 DIAGNOSIS — D649 Anemia, unspecified: Secondary | ICD-10-CM | POA: Diagnosis not present

## 2018-09-25 DIAGNOSIS — I110 Hypertensive heart disease with heart failure: Secondary | ICD-10-CM | POA: Diagnosis not present

## 2018-09-25 DIAGNOSIS — I480 Paroxysmal atrial fibrillation: Secondary | ICD-10-CM | POA: Diagnosis not present

## 2018-09-28 ENCOUNTER — Other Ambulatory Visit: Payer: Self-pay | Admitting: Family Medicine

## 2018-09-28 DIAGNOSIS — D649 Anemia, unspecified: Secondary | ICD-10-CM | POA: Diagnosis not present

## 2018-09-28 DIAGNOSIS — E785 Hyperlipidemia, unspecified: Secondary | ICD-10-CM | POA: Diagnosis not present

## 2018-09-28 DIAGNOSIS — I5033 Acute on chronic diastolic (congestive) heart failure: Secondary | ICD-10-CM | POA: Diagnosis not present

## 2018-09-28 DIAGNOSIS — I110 Hypertensive heart disease with heart failure: Secondary | ICD-10-CM | POA: Diagnosis not present

## 2018-09-28 DIAGNOSIS — I480 Paroxysmal atrial fibrillation: Secondary | ICD-10-CM | POA: Diagnosis not present

## 2018-09-28 DIAGNOSIS — I25118 Atherosclerotic heart disease of native coronary artery with other forms of angina pectoris: Secondary | ICD-10-CM | POA: Diagnosis not present

## 2018-09-28 NOTE — Telephone Encounter (Signed)
I can send in the refill but please first verify with hospice or family that patient is still using med.   Please let me know.  Thanks.

## 2018-09-28 NOTE — Telephone Encounter (Signed)
Electronic refill request. Digoxin Last office visit:   02/19/18 Last Filled:     90 tablet 1 04/10/2018  Please advise.

## 2018-09-29 DIAGNOSIS — I110 Hypertensive heart disease with heart failure: Secondary | ICD-10-CM | POA: Diagnosis not present

## 2018-09-29 DIAGNOSIS — I480 Paroxysmal atrial fibrillation: Secondary | ICD-10-CM | POA: Diagnosis not present

## 2018-09-29 DIAGNOSIS — E785 Hyperlipidemia, unspecified: Secondary | ICD-10-CM | POA: Diagnosis not present

## 2018-09-29 DIAGNOSIS — I25118 Atherosclerotic heart disease of native coronary artery with other forms of angina pectoris: Secondary | ICD-10-CM | POA: Diagnosis not present

## 2018-09-29 DIAGNOSIS — D649 Anemia, unspecified: Secondary | ICD-10-CM | POA: Diagnosis not present

## 2018-09-29 DIAGNOSIS — I5033 Acute on chronic diastolic (congestive) heart failure: Secondary | ICD-10-CM | POA: Diagnosis not present

## 2018-09-29 NOTE — Telephone Encounter (Signed)
Patient's son does not know if she is still taking it but will check to find out and let us know.

## 2018-09-30 NOTE — Telephone Encounter (Signed)
Noted. Thanks for checking.  Sent.

## 2018-09-30 NOTE — Telephone Encounter (Signed)
[name] Olivarez (son) called back to let you know pt is still take digoxin  Best number [phone]

## 2018-10-01 DIAGNOSIS — D649 Anemia, unspecified: Secondary | ICD-10-CM | POA: Diagnosis not present

## 2018-10-01 DIAGNOSIS — I110 Hypertensive heart disease with heart failure: Secondary | ICD-10-CM | POA: Diagnosis not present

## 2018-10-01 DIAGNOSIS — I480 Paroxysmal atrial fibrillation: Secondary | ICD-10-CM | POA: Diagnosis not present

## 2018-10-01 DIAGNOSIS — I5033 Acute on chronic diastolic (congestive) heart failure: Secondary | ICD-10-CM | POA: Diagnosis not present

## 2018-10-01 DIAGNOSIS — I25118 Atherosclerotic heart disease of native coronary artery with other forms of angina pectoris: Secondary | ICD-10-CM | POA: Diagnosis not present

## 2018-10-01 DIAGNOSIS — E785 Hyperlipidemia, unspecified: Secondary | ICD-10-CM | POA: Diagnosis not present

## 2018-10-05 DIAGNOSIS — E785 Hyperlipidemia, unspecified: Secondary | ICD-10-CM | POA: Diagnosis not present

## 2018-10-05 DIAGNOSIS — I25118 Atherosclerotic heart disease of native coronary artery with other forms of angina pectoris: Secondary | ICD-10-CM | POA: Diagnosis not present

## 2018-10-05 DIAGNOSIS — I5033 Acute on chronic diastolic (congestive) heart failure: Secondary | ICD-10-CM | POA: Diagnosis not present

## 2018-10-05 DIAGNOSIS — I480 Paroxysmal atrial fibrillation: Secondary | ICD-10-CM | POA: Diagnosis not present

## 2018-10-05 DIAGNOSIS — D649 Anemia, unspecified: Secondary | ICD-10-CM | POA: Diagnosis not present

## 2018-10-05 DIAGNOSIS — I110 Hypertensive heart disease with heart failure: Secondary | ICD-10-CM | POA: Diagnosis not present

## 2018-10-08 DIAGNOSIS — I5033 Acute on chronic diastolic (congestive) heart failure: Secondary | ICD-10-CM | POA: Diagnosis not present

## 2018-10-08 DIAGNOSIS — I480 Paroxysmal atrial fibrillation: Secondary | ICD-10-CM | POA: Diagnosis not present

## 2018-10-08 DIAGNOSIS — I25118 Atherosclerotic heart disease of native coronary artery with other forms of angina pectoris: Secondary | ICD-10-CM | POA: Diagnosis not present

## 2018-10-08 DIAGNOSIS — D649 Anemia, unspecified: Secondary | ICD-10-CM | POA: Diagnosis not present

## 2018-10-08 DIAGNOSIS — E785 Hyperlipidemia, unspecified: Secondary | ICD-10-CM | POA: Diagnosis not present

## 2018-10-08 DIAGNOSIS — I110 Hypertensive heart disease with heart failure: Secondary | ICD-10-CM | POA: Diagnosis not present

## 2018-10-10 DIAGNOSIS — Z7401 Bed confinement status: Secondary | ICD-10-CM | POA: Diagnosis not present

## 2018-10-10 DIAGNOSIS — Z681 Body mass index (BMI) 19 or less, adult: Secondary | ICD-10-CM | POA: Diagnosis not present

## 2018-10-10 DIAGNOSIS — R159 Full incontinence of feces: Secondary | ICD-10-CM | POA: Diagnosis not present

## 2018-10-10 DIAGNOSIS — I5033 Acute on chronic diastolic (congestive) heart failure: Secondary | ICD-10-CM | POA: Diagnosis not present

## 2018-10-10 DIAGNOSIS — E039 Hypothyroidism, unspecified: Secondary | ICD-10-CM | POA: Diagnosis not present

## 2018-10-10 DIAGNOSIS — Z466 Encounter for fitting and adjustment of urinary device: Secondary | ICD-10-CM | POA: Diagnosis not present

## 2018-10-10 DIAGNOSIS — I110 Hypertensive heart disease with heart failure: Secondary | ICD-10-CM | POA: Diagnosis not present

## 2018-10-10 DIAGNOSIS — D649 Anemia, unspecified: Secondary | ICD-10-CM | POA: Diagnosis not present

## 2018-10-10 DIAGNOSIS — Z87440 Personal history of urinary (tract) infections: Secondary | ICD-10-CM | POA: Diagnosis not present

## 2018-10-10 DIAGNOSIS — R32 Unspecified urinary incontinence: Secondary | ICD-10-CM | POA: Diagnosis not present

## 2018-10-10 DIAGNOSIS — I69891 Dysphagia following other cerebrovascular disease: Secondary | ICD-10-CM | POA: Diagnosis not present

## 2018-10-10 DIAGNOSIS — E785 Hyperlipidemia, unspecified: Secondary | ICD-10-CM | POA: Diagnosis not present

## 2018-10-10 DIAGNOSIS — Z9981 Dependence on supplemental oxygen: Secondary | ICD-10-CM | POA: Diagnosis not present

## 2018-10-10 DIAGNOSIS — I25118 Atherosclerotic heart disease of native coronary artery with other forms of angina pectoris: Secondary | ICD-10-CM | POA: Diagnosis not present

## 2018-10-10 DIAGNOSIS — Z951 Presence of aortocoronary bypass graft: Secondary | ICD-10-CM | POA: Diagnosis not present

## 2018-10-10 DIAGNOSIS — R627 Adult failure to thrive: Secondary | ICD-10-CM | POA: Diagnosis not present

## 2018-10-10 DIAGNOSIS — I69818 Other symptoms and signs involving cognitive functions following other cerebrovascular disease: Secondary | ICD-10-CM | POA: Diagnosis not present

## 2018-10-10 DIAGNOSIS — I480 Paroxysmal atrial fibrillation: Secondary | ICD-10-CM | POA: Diagnosis not present

## 2018-10-10 DIAGNOSIS — K219 Gastro-esophageal reflux disease without esophagitis: Secondary | ICD-10-CM | POA: Diagnosis not present

## 2018-10-12 DIAGNOSIS — E785 Hyperlipidemia, unspecified: Secondary | ICD-10-CM | POA: Diagnosis not present

## 2018-10-12 DIAGNOSIS — I110 Hypertensive heart disease with heart failure: Secondary | ICD-10-CM | POA: Diagnosis not present

## 2018-10-12 DIAGNOSIS — I480 Paroxysmal atrial fibrillation: Secondary | ICD-10-CM | POA: Diagnosis not present

## 2018-10-12 DIAGNOSIS — I5033 Acute on chronic diastolic (congestive) heart failure: Secondary | ICD-10-CM | POA: Diagnosis not present

## 2018-10-12 DIAGNOSIS — Z951 Presence of aortocoronary bypass graft: Secondary | ICD-10-CM | POA: Diagnosis not present

## 2018-10-12 DIAGNOSIS — I25118 Atherosclerotic heart disease of native coronary artery with other forms of angina pectoris: Secondary | ICD-10-CM | POA: Diagnosis not present

## 2018-10-15 ENCOUNTER — Other Ambulatory Visit: Payer: Self-pay | Admitting: Family Medicine

## 2018-10-15 DIAGNOSIS — E785 Hyperlipidemia, unspecified: Secondary | ICD-10-CM | POA: Diagnosis not present

## 2018-10-15 DIAGNOSIS — I25118 Atherosclerotic heart disease of native coronary artery with other forms of angina pectoris: Secondary | ICD-10-CM | POA: Diagnosis not present

## 2018-10-15 DIAGNOSIS — I110 Hypertensive heart disease with heart failure: Secondary | ICD-10-CM | POA: Diagnosis not present

## 2018-10-15 DIAGNOSIS — I5033 Acute on chronic diastolic (congestive) heart failure: Secondary | ICD-10-CM | POA: Diagnosis not present

## 2018-10-15 DIAGNOSIS — Z951 Presence of aortocoronary bypass graft: Secondary | ICD-10-CM | POA: Diagnosis not present

## 2018-10-15 DIAGNOSIS — I480 Paroxysmal atrial fibrillation: Secondary | ICD-10-CM | POA: Diagnosis not present

## 2018-10-17 DIAGNOSIS — I5033 Acute on chronic diastolic (congestive) heart failure: Secondary | ICD-10-CM | POA: Diagnosis not present

## 2018-10-17 DIAGNOSIS — I480 Paroxysmal atrial fibrillation: Secondary | ICD-10-CM | POA: Diagnosis not present

## 2018-10-17 DIAGNOSIS — I25118 Atherosclerotic heart disease of native coronary artery with other forms of angina pectoris: Secondary | ICD-10-CM | POA: Diagnosis not present

## 2018-10-17 DIAGNOSIS — I110 Hypertensive heart disease with heart failure: Secondary | ICD-10-CM | POA: Diagnosis not present

## 2018-10-17 DIAGNOSIS — Z951 Presence of aortocoronary bypass graft: Secondary | ICD-10-CM | POA: Diagnosis not present

## 2018-10-17 DIAGNOSIS — E785 Hyperlipidemia, unspecified: Secondary | ICD-10-CM | POA: Diagnosis not present

## 2018-10-18 DIAGNOSIS — I5033 Acute on chronic diastolic (congestive) heart failure: Secondary | ICD-10-CM | POA: Diagnosis not present

## 2018-10-18 DIAGNOSIS — Z951 Presence of aortocoronary bypass graft: Secondary | ICD-10-CM | POA: Diagnosis not present

## 2018-10-18 DIAGNOSIS — E785 Hyperlipidemia, unspecified: Secondary | ICD-10-CM | POA: Diagnosis not present

## 2018-10-18 DIAGNOSIS — I110 Hypertensive heart disease with heart failure: Secondary | ICD-10-CM | POA: Diagnosis not present

## 2018-10-18 DIAGNOSIS — I480 Paroxysmal atrial fibrillation: Secondary | ICD-10-CM | POA: Diagnosis not present

## 2018-10-18 DIAGNOSIS — I25118 Atherosclerotic heart disease of native coronary artery with other forms of angina pectoris: Secondary | ICD-10-CM | POA: Diagnosis not present

## 2018-10-19 DIAGNOSIS — E785 Hyperlipidemia, unspecified: Secondary | ICD-10-CM | POA: Diagnosis not present

## 2018-10-19 DIAGNOSIS — Z951 Presence of aortocoronary bypass graft: Secondary | ICD-10-CM | POA: Diagnosis not present

## 2018-10-19 DIAGNOSIS — I5033 Acute on chronic diastolic (congestive) heart failure: Secondary | ICD-10-CM | POA: Diagnosis not present

## 2018-10-19 DIAGNOSIS — I480 Paroxysmal atrial fibrillation: Secondary | ICD-10-CM | POA: Diagnosis not present

## 2018-10-19 DIAGNOSIS — I110 Hypertensive heart disease with heart failure: Secondary | ICD-10-CM | POA: Diagnosis not present

## 2018-10-19 DIAGNOSIS — I25118 Atherosclerotic heart disease of native coronary artery with other forms of angina pectoris: Secondary | ICD-10-CM | POA: Diagnosis not present

## 2018-10-20 DIAGNOSIS — I5033 Acute on chronic diastolic (congestive) heart failure: Secondary | ICD-10-CM | POA: Diagnosis not present

## 2018-10-20 DIAGNOSIS — I480 Paroxysmal atrial fibrillation: Secondary | ICD-10-CM | POA: Diagnosis not present

## 2018-10-20 DIAGNOSIS — I110 Hypertensive heart disease with heart failure: Secondary | ICD-10-CM | POA: Diagnosis not present

## 2018-10-20 DIAGNOSIS — Z951 Presence of aortocoronary bypass graft: Secondary | ICD-10-CM | POA: Diagnosis not present

## 2018-10-20 DIAGNOSIS — I25118 Atherosclerotic heart disease of native coronary artery with other forms of angina pectoris: Secondary | ICD-10-CM | POA: Diagnosis not present

## 2018-10-20 DIAGNOSIS — E785 Hyperlipidemia, unspecified: Secondary | ICD-10-CM | POA: Diagnosis not present

## 2018-10-22 DIAGNOSIS — I5033 Acute on chronic diastolic (congestive) heart failure: Secondary | ICD-10-CM | POA: Diagnosis not present

## 2018-10-22 DIAGNOSIS — I110 Hypertensive heart disease with heart failure: Secondary | ICD-10-CM | POA: Diagnosis not present

## 2018-10-22 DIAGNOSIS — I480 Paroxysmal atrial fibrillation: Secondary | ICD-10-CM | POA: Diagnosis not present

## 2018-10-22 DIAGNOSIS — Z951 Presence of aortocoronary bypass graft: Secondary | ICD-10-CM | POA: Diagnosis not present

## 2018-10-22 DIAGNOSIS — E785 Hyperlipidemia, unspecified: Secondary | ICD-10-CM | POA: Diagnosis not present

## 2018-10-22 DIAGNOSIS — I25118 Atherosclerotic heart disease of native coronary artery with other forms of angina pectoris: Secondary | ICD-10-CM | POA: Diagnosis not present

## 2018-10-26 DIAGNOSIS — I25118 Atherosclerotic heart disease of native coronary artery with other forms of angina pectoris: Secondary | ICD-10-CM | POA: Diagnosis not present

## 2018-10-26 DIAGNOSIS — E785 Hyperlipidemia, unspecified: Secondary | ICD-10-CM | POA: Diagnosis not present

## 2018-10-26 DIAGNOSIS — Z951 Presence of aortocoronary bypass graft: Secondary | ICD-10-CM | POA: Diagnosis not present

## 2018-10-26 DIAGNOSIS — I480 Paroxysmal atrial fibrillation: Secondary | ICD-10-CM | POA: Diagnosis not present

## 2018-10-26 DIAGNOSIS — I5033 Acute on chronic diastolic (congestive) heart failure: Secondary | ICD-10-CM | POA: Diagnosis not present

## 2018-10-26 DIAGNOSIS — I110 Hypertensive heart disease with heart failure: Secondary | ICD-10-CM | POA: Diagnosis not present

## 2018-10-27 ENCOUNTER — Other Ambulatory Visit: Payer: Self-pay | Admitting: Family Medicine

## 2018-10-27 DIAGNOSIS — I110 Hypertensive heart disease with heart failure: Secondary | ICD-10-CM | POA: Diagnosis not present

## 2018-10-27 DIAGNOSIS — I480 Paroxysmal atrial fibrillation: Secondary | ICD-10-CM | POA: Diagnosis not present

## 2018-10-27 DIAGNOSIS — I5033 Acute on chronic diastolic (congestive) heart failure: Secondary | ICD-10-CM | POA: Diagnosis not present

## 2018-10-27 DIAGNOSIS — I25118 Atherosclerotic heart disease of native coronary artery with other forms of angina pectoris: Secondary | ICD-10-CM | POA: Diagnosis not present

## 2018-10-27 DIAGNOSIS — Z951 Presence of aortocoronary bypass graft: Secondary | ICD-10-CM | POA: Diagnosis not present

## 2018-10-27 DIAGNOSIS — E785 Hyperlipidemia, unspecified: Secondary | ICD-10-CM | POA: Diagnosis not present

## 2018-10-29 ENCOUNTER — Other Ambulatory Visit: Payer: Self-pay | Admitting: Family Medicine

## 2018-10-29 DIAGNOSIS — Z951 Presence of aortocoronary bypass graft: Secondary | ICD-10-CM | POA: Diagnosis not present

## 2018-10-29 DIAGNOSIS — I110 Hypertensive heart disease with heart failure: Secondary | ICD-10-CM | POA: Diagnosis not present

## 2018-10-29 DIAGNOSIS — I25118 Atherosclerotic heart disease of native coronary artery with other forms of angina pectoris: Secondary | ICD-10-CM | POA: Diagnosis not present

## 2018-10-29 DIAGNOSIS — I5033 Acute on chronic diastolic (congestive) heart failure: Secondary | ICD-10-CM | POA: Diagnosis not present

## 2018-10-29 DIAGNOSIS — E785 Hyperlipidemia, unspecified: Secondary | ICD-10-CM | POA: Diagnosis not present

## 2018-10-29 DIAGNOSIS — I480 Paroxysmal atrial fibrillation: Secondary | ICD-10-CM | POA: Diagnosis not present

## 2018-10-29 NOTE — Telephone Encounter (Signed)
.  Electronic refill request. Bactrim Last office visit:   02/19/18 Acute Last Filled:   No longer on current meds list Please advise.

## 2018-10-30 NOTE — Telephone Encounter (Signed)
Thanks. Denied.

## 2018-10-30 NOTE — Telephone Encounter (Signed)
Please see what generated this request and let me know.  Thanks.   I didn't send rx yet.

## 2018-10-30 NOTE — Telephone Encounter (Signed)
Caregiver doesn't know anything about the request.  Pharmacy says they wouldn't have sent the request without someone requesting it but they could not tell who requested it.  Hospice has no record of requesting this medication.  However, Hospice triage nurse says she will send a message out to the nurse who sees the patient and if this is a request, she will call us back, otherwise, please refuse the Rx.

## 2018-11-02 DIAGNOSIS — I480 Paroxysmal atrial fibrillation: Secondary | ICD-10-CM | POA: Diagnosis not present

## 2018-11-02 DIAGNOSIS — I25118 Atherosclerotic heart disease of native coronary artery with other forms of angina pectoris: Secondary | ICD-10-CM | POA: Diagnosis not present

## 2018-11-02 DIAGNOSIS — I5033 Acute on chronic diastolic (congestive) heart failure: Secondary | ICD-10-CM | POA: Diagnosis not present

## 2018-11-02 DIAGNOSIS — I110 Hypertensive heart disease with heart failure: Secondary | ICD-10-CM | POA: Diagnosis not present

## 2018-11-02 DIAGNOSIS — Z951 Presence of aortocoronary bypass graft: Secondary | ICD-10-CM | POA: Diagnosis not present

## 2018-11-02 DIAGNOSIS — E785 Hyperlipidemia, unspecified: Secondary | ICD-10-CM | POA: Diagnosis not present

## 2018-11-03 DIAGNOSIS — I5033 Acute on chronic diastolic (congestive) heart failure: Secondary | ICD-10-CM | POA: Diagnosis not present

## 2018-11-03 DIAGNOSIS — I110 Hypertensive heart disease with heart failure: Secondary | ICD-10-CM | POA: Diagnosis not present

## 2018-11-03 DIAGNOSIS — I25118 Atherosclerotic heart disease of native coronary artery with other forms of angina pectoris: Secondary | ICD-10-CM | POA: Diagnosis not present

## 2018-11-03 DIAGNOSIS — I480 Paroxysmal atrial fibrillation: Secondary | ICD-10-CM | POA: Diagnosis not present

## 2018-11-03 DIAGNOSIS — Z951 Presence of aortocoronary bypass graft: Secondary | ICD-10-CM | POA: Diagnosis not present

## 2018-11-03 DIAGNOSIS — E785 Hyperlipidemia, unspecified: Secondary | ICD-10-CM | POA: Diagnosis not present

## 2018-11-05 DIAGNOSIS — I110 Hypertensive heart disease with heart failure: Secondary | ICD-10-CM | POA: Diagnosis not present

## 2018-11-05 DIAGNOSIS — I480 Paroxysmal atrial fibrillation: Secondary | ICD-10-CM | POA: Diagnosis not present

## 2018-11-05 DIAGNOSIS — Z951 Presence of aortocoronary bypass graft: Secondary | ICD-10-CM | POA: Diagnosis not present

## 2018-11-05 DIAGNOSIS — I25118 Atherosclerotic heart disease of native coronary artery with other forms of angina pectoris: Secondary | ICD-10-CM | POA: Diagnosis not present

## 2018-11-05 DIAGNOSIS — I5033 Acute on chronic diastolic (congestive) heart failure: Secondary | ICD-10-CM | POA: Diagnosis not present

## 2018-11-05 DIAGNOSIS — E785 Hyperlipidemia, unspecified: Secondary | ICD-10-CM | POA: Diagnosis not present

## 2018-11-08 DIAGNOSIS — Z7401 Bed confinement status: Secondary | ICD-10-CM | POA: Diagnosis not present

## 2018-11-08 DIAGNOSIS — R627 Adult failure to thrive: Secondary | ICD-10-CM | POA: Diagnosis not present

## 2018-11-08 DIAGNOSIS — E785 Hyperlipidemia, unspecified: Secondary | ICD-10-CM | POA: Diagnosis not present

## 2018-11-08 DIAGNOSIS — R159 Full incontinence of feces: Secondary | ICD-10-CM | POA: Diagnosis not present

## 2018-11-08 DIAGNOSIS — I69891 Dysphagia following other cerebrovascular disease: Secondary | ICD-10-CM | POA: Diagnosis not present

## 2018-11-08 DIAGNOSIS — Z87440 Personal history of urinary (tract) infections: Secondary | ICD-10-CM | POA: Diagnosis not present

## 2018-11-08 DIAGNOSIS — Z681 Body mass index (BMI) 19 or less, adult: Secondary | ICD-10-CM | POA: Diagnosis not present

## 2018-11-08 DIAGNOSIS — R32 Unspecified urinary incontinence: Secondary | ICD-10-CM | POA: Diagnosis not present

## 2018-11-08 DIAGNOSIS — D649 Anemia, unspecified: Secondary | ICD-10-CM | POA: Diagnosis not present

## 2018-11-08 DIAGNOSIS — Z9981 Dependence on supplemental oxygen: Secondary | ICD-10-CM | POA: Diagnosis not present

## 2018-11-08 DIAGNOSIS — K219 Gastro-esophageal reflux disease without esophagitis: Secondary | ICD-10-CM | POA: Diagnosis not present

## 2018-11-08 DIAGNOSIS — Z951 Presence of aortocoronary bypass graft: Secondary | ICD-10-CM | POA: Diagnosis not present

## 2018-11-08 DIAGNOSIS — I69818 Other symptoms and signs involving cognitive functions following other cerebrovascular disease: Secondary | ICD-10-CM | POA: Diagnosis not present

## 2018-11-08 DIAGNOSIS — I25118 Atherosclerotic heart disease of native coronary artery with other forms of angina pectoris: Secondary | ICD-10-CM | POA: Diagnosis not present

## 2018-11-08 DIAGNOSIS — E039 Hypothyroidism, unspecified: Secondary | ICD-10-CM | POA: Diagnosis not present

## 2018-11-08 DIAGNOSIS — Z466 Encounter for fitting and adjustment of urinary device: Secondary | ICD-10-CM | POA: Diagnosis not present

## 2018-11-08 DIAGNOSIS — I5033 Acute on chronic diastolic (congestive) heart failure: Secondary | ICD-10-CM | POA: Diagnosis not present

## 2018-11-08 DIAGNOSIS — I480 Paroxysmal atrial fibrillation: Secondary | ICD-10-CM | POA: Diagnosis not present

## 2018-11-08 DIAGNOSIS — I110 Hypertensive heart disease with heart failure: Secondary | ICD-10-CM | POA: Diagnosis not present

## 2018-11-09 ENCOUNTER — Other Ambulatory Visit: Payer: Self-pay | Admitting: Family Medicine

## 2018-11-09 DIAGNOSIS — Z951 Presence of aortocoronary bypass graft: Secondary | ICD-10-CM | POA: Diagnosis not present

## 2018-11-09 DIAGNOSIS — I480 Paroxysmal atrial fibrillation: Secondary | ICD-10-CM | POA: Diagnosis not present

## 2018-11-09 DIAGNOSIS — I110 Hypertensive heart disease with heart failure: Secondary | ICD-10-CM | POA: Diagnosis not present

## 2018-11-09 DIAGNOSIS — I25118 Atherosclerotic heart disease of native coronary artery with other forms of angina pectoris: Secondary | ICD-10-CM | POA: Diagnosis not present

## 2018-11-09 DIAGNOSIS — I5033 Acute on chronic diastolic (congestive) heart failure: Secondary | ICD-10-CM | POA: Diagnosis not present

## 2018-11-09 DIAGNOSIS — E785 Hyperlipidemia, unspecified: Secondary | ICD-10-CM | POA: Diagnosis not present

## 2018-11-11 DIAGNOSIS — E785 Hyperlipidemia, unspecified: Secondary | ICD-10-CM | POA: Diagnosis not present

## 2018-11-11 DIAGNOSIS — Z951 Presence of aortocoronary bypass graft: Secondary | ICD-10-CM | POA: Diagnosis not present

## 2018-11-11 DIAGNOSIS — I25118 Atherosclerotic heart disease of native coronary artery with other forms of angina pectoris: Secondary | ICD-10-CM | POA: Diagnosis not present

## 2018-11-11 DIAGNOSIS — I5033 Acute on chronic diastolic (congestive) heart failure: Secondary | ICD-10-CM | POA: Diagnosis not present

## 2018-11-11 DIAGNOSIS — I110 Hypertensive heart disease with heart failure: Secondary | ICD-10-CM | POA: Diagnosis not present

## 2018-11-11 DIAGNOSIS — I480 Paroxysmal atrial fibrillation: Secondary | ICD-10-CM | POA: Diagnosis not present

## 2018-11-12 DIAGNOSIS — I5033 Acute on chronic diastolic (congestive) heart failure: Secondary | ICD-10-CM | POA: Diagnosis not present

## 2018-11-12 DIAGNOSIS — I110 Hypertensive heart disease with heart failure: Secondary | ICD-10-CM | POA: Diagnosis not present

## 2018-11-12 DIAGNOSIS — I25118 Atherosclerotic heart disease of native coronary artery with other forms of angina pectoris: Secondary | ICD-10-CM | POA: Diagnosis not present

## 2018-11-12 DIAGNOSIS — Z951 Presence of aortocoronary bypass graft: Secondary | ICD-10-CM | POA: Diagnosis not present

## 2018-11-12 DIAGNOSIS — E785 Hyperlipidemia, unspecified: Secondary | ICD-10-CM | POA: Diagnosis not present

## 2018-11-12 DIAGNOSIS — I480 Paroxysmal atrial fibrillation: Secondary | ICD-10-CM | POA: Diagnosis not present

## 2018-11-16 DIAGNOSIS — E785 Hyperlipidemia, unspecified: Secondary | ICD-10-CM | POA: Diagnosis not present

## 2018-11-16 DIAGNOSIS — I25118 Atherosclerotic heart disease of native coronary artery with other forms of angina pectoris: Secondary | ICD-10-CM | POA: Diagnosis not present

## 2018-11-16 DIAGNOSIS — I480 Paroxysmal atrial fibrillation: Secondary | ICD-10-CM | POA: Diagnosis not present

## 2018-11-16 DIAGNOSIS — I110 Hypertensive heart disease with heart failure: Secondary | ICD-10-CM | POA: Diagnosis not present

## 2018-11-16 DIAGNOSIS — Z951 Presence of aortocoronary bypass graft: Secondary | ICD-10-CM | POA: Diagnosis not present

## 2018-11-16 DIAGNOSIS — I5033 Acute on chronic diastolic (congestive) heart failure: Secondary | ICD-10-CM | POA: Diagnosis not present

## 2018-11-17 DIAGNOSIS — E785 Hyperlipidemia, unspecified: Secondary | ICD-10-CM | POA: Diagnosis not present

## 2018-11-17 DIAGNOSIS — I110 Hypertensive heart disease with heart failure: Secondary | ICD-10-CM | POA: Diagnosis not present

## 2018-11-17 DIAGNOSIS — I5033 Acute on chronic diastolic (congestive) heart failure: Secondary | ICD-10-CM | POA: Diagnosis not present

## 2018-11-17 DIAGNOSIS — I480 Paroxysmal atrial fibrillation: Secondary | ICD-10-CM | POA: Diagnosis not present

## 2018-11-17 DIAGNOSIS — Z951 Presence of aortocoronary bypass graft: Secondary | ICD-10-CM | POA: Diagnosis not present

## 2018-11-17 DIAGNOSIS — I25118 Atherosclerotic heart disease of native coronary artery with other forms of angina pectoris: Secondary | ICD-10-CM | POA: Diagnosis not present

## 2018-11-19 DIAGNOSIS — Z951 Presence of aortocoronary bypass graft: Secondary | ICD-10-CM | POA: Diagnosis not present

## 2018-11-19 DIAGNOSIS — I5033 Acute on chronic diastolic (congestive) heart failure: Secondary | ICD-10-CM | POA: Diagnosis not present

## 2018-11-19 DIAGNOSIS — I110 Hypertensive heart disease with heart failure: Secondary | ICD-10-CM | POA: Diagnosis not present

## 2018-11-19 DIAGNOSIS — I25118 Atherosclerotic heart disease of native coronary artery with other forms of angina pectoris: Secondary | ICD-10-CM | POA: Diagnosis not present

## 2018-11-19 DIAGNOSIS — E785 Hyperlipidemia, unspecified: Secondary | ICD-10-CM | POA: Diagnosis not present

## 2018-11-19 DIAGNOSIS — I480 Paroxysmal atrial fibrillation: Secondary | ICD-10-CM | POA: Diagnosis not present

## 2018-11-23 DIAGNOSIS — I480 Paroxysmal atrial fibrillation: Secondary | ICD-10-CM | POA: Diagnosis not present

## 2018-11-23 DIAGNOSIS — I110 Hypertensive heart disease with heart failure: Secondary | ICD-10-CM | POA: Diagnosis not present

## 2018-11-23 DIAGNOSIS — E785 Hyperlipidemia, unspecified: Secondary | ICD-10-CM | POA: Diagnosis not present

## 2018-11-23 DIAGNOSIS — I5033 Acute on chronic diastolic (congestive) heart failure: Secondary | ICD-10-CM | POA: Diagnosis not present

## 2018-11-23 DIAGNOSIS — I25118 Atherosclerotic heart disease of native coronary artery with other forms of angina pectoris: Secondary | ICD-10-CM | POA: Diagnosis not present

## 2018-11-23 DIAGNOSIS — Z951 Presence of aortocoronary bypass graft: Secondary | ICD-10-CM | POA: Diagnosis not present

## 2018-11-26 DIAGNOSIS — Z951 Presence of aortocoronary bypass graft: Secondary | ICD-10-CM | POA: Diagnosis not present

## 2018-11-26 DIAGNOSIS — E785 Hyperlipidemia, unspecified: Secondary | ICD-10-CM | POA: Diagnosis not present

## 2018-11-26 DIAGNOSIS — I110 Hypertensive heart disease with heart failure: Secondary | ICD-10-CM | POA: Diagnosis not present

## 2018-11-26 DIAGNOSIS — I25118 Atherosclerotic heart disease of native coronary artery with other forms of angina pectoris: Secondary | ICD-10-CM | POA: Diagnosis not present

## 2018-11-26 DIAGNOSIS — I480 Paroxysmal atrial fibrillation: Secondary | ICD-10-CM | POA: Diagnosis not present

## 2018-11-26 DIAGNOSIS — I5033 Acute on chronic diastolic (congestive) heart failure: Secondary | ICD-10-CM | POA: Diagnosis not present

## 2018-11-30 DIAGNOSIS — I25118 Atherosclerotic heart disease of native coronary artery with other forms of angina pectoris: Secondary | ICD-10-CM | POA: Diagnosis not present

## 2018-11-30 DIAGNOSIS — I110 Hypertensive heart disease with heart failure: Secondary | ICD-10-CM | POA: Diagnosis not present

## 2018-11-30 DIAGNOSIS — Z951 Presence of aortocoronary bypass graft: Secondary | ICD-10-CM | POA: Diagnosis not present

## 2018-11-30 DIAGNOSIS — I480 Paroxysmal atrial fibrillation: Secondary | ICD-10-CM | POA: Diagnosis not present

## 2018-11-30 DIAGNOSIS — E785 Hyperlipidemia, unspecified: Secondary | ICD-10-CM | POA: Diagnosis not present

## 2018-11-30 DIAGNOSIS — I5033 Acute on chronic diastolic (congestive) heart failure: Secondary | ICD-10-CM | POA: Diagnosis not present

## 2018-12-01 ENCOUNTER — Telehealth: Payer: Self-pay

## 2018-12-01 NOTE — Telephone Encounter (Signed)
Mtzy, with [employer] (formerly known as [facility] called and l/m stating it is time for patient's re certification with Hospice and since the change with name/company they need a verbal ok from the provider to continue Hospice care. CB to Mitzy is [phone]. Please review

## 2018-12-02 NOTE — Telephone Encounter (Signed)
Please give the order.  Thanks.   

## 2018-12-02 NOTE — Telephone Encounter (Signed)
Verbal authorization given to Mitzie.

## 2018-12-03 DIAGNOSIS — E785 Hyperlipidemia, unspecified: Secondary | ICD-10-CM | POA: Diagnosis not present

## 2018-12-03 DIAGNOSIS — Z951 Presence of aortocoronary bypass graft: Secondary | ICD-10-CM | POA: Diagnosis not present

## 2018-12-03 DIAGNOSIS — I480 Paroxysmal atrial fibrillation: Secondary | ICD-10-CM | POA: Diagnosis not present

## 2018-12-03 DIAGNOSIS — I5033 Acute on chronic diastolic (congestive) heart failure: Secondary | ICD-10-CM | POA: Diagnosis not present

## 2018-12-03 DIAGNOSIS — I110 Hypertensive heart disease with heart failure: Secondary | ICD-10-CM | POA: Diagnosis not present

## 2018-12-03 DIAGNOSIS — I25118 Atherosclerotic heart disease of native coronary artery with other forms of angina pectoris: Secondary | ICD-10-CM | POA: Diagnosis not present

## 2018-12-07 ENCOUNTER — Other Ambulatory Visit: Payer: Self-pay | Admitting: Family Medicine

## 2018-12-07 NOTE — Telephone Encounter (Signed)
Did you need to schedule a phone visit for an update last OV 09/2017... please advise

## 2018-12-08 NOTE — Telephone Encounter (Signed)
OV not needed, hospice patient.   Please verify current use of these meds prior to filling- please check with hospice.   I'm okay with filling if she is still able to take them.  Thanks.

## 2018-12-08 NOTE — Telephone Encounter (Signed)
Spoke to [employer], Merchandiser, retail. She is taking these medications. Need to be in 15 day supplies.

## 2018-12-09 DIAGNOSIS — I69891 Dysphagia following other cerebrovascular disease: Secondary | ICD-10-CM | POA: Diagnosis not present

## 2018-12-09 DIAGNOSIS — Z9981 Dependence on supplemental oxygen: Secondary | ICD-10-CM | POA: Diagnosis not present

## 2018-12-09 DIAGNOSIS — D649 Anemia, unspecified: Secondary | ICD-10-CM | POA: Diagnosis not present

## 2018-12-09 DIAGNOSIS — I480 Paroxysmal atrial fibrillation: Secondary | ICD-10-CM | POA: Diagnosis not present

## 2018-12-09 DIAGNOSIS — Z466 Encounter for fitting and adjustment of urinary device: Secondary | ICD-10-CM | POA: Diagnosis not present

## 2018-12-09 DIAGNOSIS — I25118 Atherosclerotic heart disease of native coronary artery with other forms of angina pectoris: Secondary | ICD-10-CM | POA: Diagnosis not present

## 2018-12-09 DIAGNOSIS — I110 Hypertensive heart disease with heart failure: Secondary | ICD-10-CM | POA: Diagnosis not present

## 2018-12-09 DIAGNOSIS — I69818 Other symptoms and signs involving cognitive functions following other cerebrovascular disease: Secondary | ICD-10-CM | POA: Diagnosis not present

## 2018-12-09 DIAGNOSIS — R32 Unspecified urinary incontinence: Secondary | ICD-10-CM | POA: Diagnosis not present

## 2018-12-09 DIAGNOSIS — E785 Hyperlipidemia, unspecified: Secondary | ICD-10-CM | POA: Diagnosis not present

## 2018-12-09 DIAGNOSIS — Z7401 Bed confinement status: Secondary | ICD-10-CM | POA: Diagnosis not present

## 2018-12-09 DIAGNOSIS — Z681 Body mass index (BMI) 19 or less, adult: Secondary | ICD-10-CM | POA: Diagnosis not present

## 2018-12-09 DIAGNOSIS — Z951 Presence of aortocoronary bypass graft: Secondary | ICD-10-CM | POA: Diagnosis not present

## 2018-12-09 DIAGNOSIS — E039 Hypothyroidism, unspecified: Secondary | ICD-10-CM | POA: Diagnosis not present

## 2018-12-09 DIAGNOSIS — D485 Neoplasm of uncertain behavior of skin: Secondary | ICD-10-CM | POA: Diagnosis not present

## 2018-12-09 DIAGNOSIS — R627 Adult failure to thrive: Secondary | ICD-10-CM | POA: Diagnosis not present

## 2018-12-09 DIAGNOSIS — R159 Full incontinence of feces: Secondary | ICD-10-CM | POA: Diagnosis not present

## 2018-12-09 DIAGNOSIS — K219 Gastro-esophageal reflux disease without esophagitis: Secondary | ICD-10-CM | POA: Diagnosis not present

## 2018-12-09 DIAGNOSIS — Z87440 Personal history of urinary (tract) infections: Secondary | ICD-10-CM | POA: Diagnosis not present

## 2018-12-09 DIAGNOSIS — I5033 Acute on chronic diastolic (congestive) heart failure: Secondary | ICD-10-CM | POA: Diagnosis not present

## 2018-12-16 ENCOUNTER — Telehealth: Payer: Self-pay

## 2018-12-16 NOTE — Telephone Encounter (Signed)
Called patients son. Okay per DPR.  LMOV for him to call back about an appointment for the patient.  Patient is past due for a 12 month follow up.  Patient will need an evisit.

## 2018-12-18 DIAGNOSIS — D649 Anemia, unspecified: Secondary | ICD-10-CM | POA: Diagnosis not present

## 2018-12-18 DIAGNOSIS — I480 Paroxysmal atrial fibrillation: Secondary | ICD-10-CM | POA: Diagnosis not present

## 2018-12-18 DIAGNOSIS — I5033 Acute on chronic diastolic (congestive) heart failure: Secondary | ICD-10-CM | POA: Diagnosis not present

## 2018-12-18 DIAGNOSIS — I110 Hypertensive heart disease with heart failure: Secondary | ICD-10-CM | POA: Diagnosis not present

## 2018-12-18 DIAGNOSIS — E785 Hyperlipidemia, unspecified: Secondary | ICD-10-CM | POA: Diagnosis not present

## 2018-12-18 DIAGNOSIS — I25118 Atherosclerotic heart disease of native coronary artery with other forms of angina pectoris: Secondary | ICD-10-CM | POA: Diagnosis not present

## 2018-12-23 ENCOUNTER — Other Ambulatory Visit: Payer: Self-pay | Admitting: Family Medicine

## 2018-12-23 NOTE — Telephone Encounter (Signed)
Sent. Thanks.  Okay to continue prn.

## 2018-12-23 NOTE — Telephone Encounter (Signed)
Last prescribed on 03/30/2018. Last office visit on 02/19/2018 with Dr Glori Bickers for acute. No future appointment

## 2018-12-25 NOTE — Telephone Encounter (Signed)
Called patient from recall list.  No answer. LMOV.  Trying to schedule an Evisit with patient.  Past due 12 month follow up.

## 2019-01-08 DIAGNOSIS — I5033 Acute on chronic diastolic (congestive) heart failure: Secondary | ICD-10-CM | POA: Diagnosis not present

## 2019-01-08 DIAGNOSIS — I25118 Atherosclerotic heart disease of native coronary artery with other forms of angina pectoris: Secondary | ICD-10-CM | POA: Diagnosis not present

## 2019-01-08 DIAGNOSIS — E039 Hypothyroidism, unspecified: Secondary | ICD-10-CM | POA: Diagnosis not present

## 2019-01-08 DIAGNOSIS — I69891 Dysphagia following other cerebrovascular disease: Secondary | ICD-10-CM | POA: Diagnosis not present

## 2019-01-08 DIAGNOSIS — Z7401 Bed confinement status: Secondary | ICD-10-CM | POA: Diagnosis not present

## 2019-01-08 DIAGNOSIS — R627 Adult failure to thrive: Secondary | ICD-10-CM | POA: Diagnosis not present

## 2019-01-08 DIAGNOSIS — I480 Paroxysmal atrial fibrillation: Secondary | ICD-10-CM | POA: Diagnosis not present

## 2019-01-08 DIAGNOSIS — R32 Unspecified urinary incontinence: Secondary | ICD-10-CM | POA: Diagnosis not present

## 2019-01-08 DIAGNOSIS — Z466 Encounter for fitting and adjustment of urinary device: Secondary | ICD-10-CM | POA: Diagnosis not present

## 2019-01-08 DIAGNOSIS — R159 Full incontinence of feces: Secondary | ICD-10-CM | POA: Diagnosis not present

## 2019-01-08 DIAGNOSIS — I69818 Other symptoms and signs involving cognitive functions following other cerebrovascular disease: Secondary | ICD-10-CM | POA: Diagnosis not present

## 2019-01-08 DIAGNOSIS — Z681 Body mass index (BMI) 19 or less, adult: Secondary | ICD-10-CM | POA: Diagnosis not present

## 2019-01-08 DIAGNOSIS — D649 Anemia, unspecified: Secondary | ICD-10-CM | POA: Diagnosis not present

## 2019-01-08 DIAGNOSIS — D485 Neoplasm of uncertain behavior of skin: Secondary | ICD-10-CM | POA: Diagnosis not present

## 2019-01-08 DIAGNOSIS — E785 Hyperlipidemia, unspecified: Secondary | ICD-10-CM | POA: Diagnosis not present

## 2019-01-08 DIAGNOSIS — Z9981 Dependence on supplemental oxygen: Secondary | ICD-10-CM | POA: Diagnosis not present

## 2019-01-08 DIAGNOSIS — Z87440 Personal history of urinary (tract) infections: Secondary | ICD-10-CM | POA: Diagnosis not present

## 2019-01-08 DIAGNOSIS — Z951 Presence of aortocoronary bypass graft: Secondary | ICD-10-CM | POA: Diagnosis not present

## 2019-01-08 DIAGNOSIS — I110 Hypertensive heart disease with heart failure: Secondary | ICD-10-CM | POA: Diagnosis not present

## 2019-01-08 DIAGNOSIS — K219 Gastro-esophageal reflux disease without esophagitis: Secondary | ICD-10-CM | POA: Diagnosis not present

## 2019-01-14 NOTE — Telephone Encounter (Signed)
Called patient from recall list.  Patients caregiver answered the phone.  She stated that the patient is currently on Hospice and would let her son know about the telehealth visit.  She stated they would give [place] a call back.

## 2019-01-18 DIAGNOSIS — D649 Anemia, unspecified: Secondary | ICD-10-CM | POA: Diagnosis not present

## 2019-01-18 DIAGNOSIS — I110 Hypertensive heart disease with heart failure: Secondary | ICD-10-CM | POA: Diagnosis not present

## 2019-01-18 DIAGNOSIS — I25118 Atherosclerotic heart disease of native coronary artery with other forms of angina pectoris: Secondary | ICD-10-CM | POA: Diagnosis not present

## 2019-01-18 DIAGNOSIS — I480 Paroxysmal atrial fibrillation: Secondary | ICD-10-CM | POA: Diagnosis not present

## 2019-01-18 DIAGNOSIS — I5033 Acute on chronic diastolic (congestive) heart failure: Secondary | ICD-10-CM | POA: Diagnosis not present

## 2019-01-18 DIAGNOSIS — E785 Hyperlipidemia, unspecified: Secondary | ICD-10-CM | POA: Diagnosis not present

## 2019-01-20 DIAGNOSIS — I5033 Acute on chronic diastolic (congestive) heart failure: Secondary | ICD-10-CM | POA: Diagnosis not present

## 2019-01-20 DIAGNOSIS — I110 Hypertensive heart disease with heart failure: Secondary | ICD-10-CM | POA: Diagnosis not present

## 2019-01-20 DIAGNOSIS — E785 Hyperlipidemia, unspecified: Secondary | ICD-10-CM | POA: Diagnosis not present

## 2019-01-20 DIAGNOSIS — D649 Anemia, unspecified: Secondary | ICD-10-CM | POA: Diagnosis not present

## 2019-01-20 DIAGNOSIS — I480 Paroxysmal atrial fibrillation: Secondary | ICD-10-CM | POA: Diagnosis not present

## 2019-01-20 DIAGNOSIS — I25118 Atherosclerotic heart disease of native coronary artery with other forms of angina pectoris: Secondary | ICD-10-CM | POA: Diagnosis not present

## 2019-02-04 DIAGNOSIS — D649 Anemia, unspecified: Secondary | ICD-10-CM | POA: Diagnosis not present

## 2019-02-04 DIAGNOSIS — I25118 Atherosclerotic heart disease of native coronary artery with other forms of angina pectoris: Secondary | ICD-10-CM | POA: Diagnosis not present

## 2019-02-04 DIAGNOSIS — E785 Hyperlipidemia, unspecified: Secondary | ICD-10-CM | POA: Diagnosis not present

## 2019-02-04 DIAGNOSIS — I110 Hypertensive heart disease with heart failure: Secondary | ICD-10-CM | POA: Diagnosis not present

## 2019-02-04 DIAGNOSIS — I5033 Acute on chronic diastolic (congestive) heart failure: Secondary | ICD-10-CM | POA: Diagnosis not present

## 2019-02-04 DIAGNOSIS — I480 Paroxysmal atrial fibrillation: Secondary | ICD-10-CM | POA: Diagnosis not present

## 2019-02-08 ENCOUNTER — Telehealth: Payer: Self-pay

## 2019-02-08 DIAGNOSIS — R32 Unspecified urinary incontinence: Secondary | ICD-10-CM | POA: Diagnosis not present

## 2019-02-08 DIAGNOSIS — R627 Adult failure to thrive: Secondary | ICD-10-CM | POA: Diagnosis not present

## 2019-02-08 DIAGNOSIS — Z681 Body mass index (BMI) 19 or less, adult: Secondary | ICD-10-CM | POA: Diagnosis not present

## 2019-02-08 DIAGNOSIS — I25118 Atherosclerotic heart disease of native coronary artery with other forms of angina pectoris: Secondary | ICD-10-CM | POA: Diagnosis not present

## 2019-02-08 DIAGNOSIS — I69818 Other symptoms and signs involving cognitive functions following other cerebrovascular disease: Secondary | ICD-10-CM | POA: Diagnosis not present

## 2019-02-08 DIAGNOSIS — K219 Gastro-esophageal reflux disease without esophagitis: Secondary | ICD-10-CM | POA: Diagnosis not present

## 2019-02-08 DIAGNOSIS — D485 Neoplasm of uncertain behavior of skin: Secondary | ICD-10-CM | POA: Diagnosis not present

## 2019-02-08 DIAGNOSIS — Z9981 Dependence on supplemental oxygen: Secondary | ICD-10-CM | POA: Diagnosis not present

## 2019-02-08 DIAGNOSIS — E785 Hyperlipidemia, unspecified: Secondary | ICD-10-CM | POA: Diagnosis not present

## 2019-02-08 DIAGNOSIS — I110 Hypertensive heart disease with heart failure: Secondary | ICD-10-CM | POA: Diagnosis not present

## 2019-02-08 DIAGNOSIS — Z87440 Personal history of urinary (tract) infections: Secondary | ICD-10-CM | POA: Diagnosis not present

## 2019-02-08 DIAGNOSIS — E039 Hypothyroidism, unspecified: Secondary | ICD-10-CM | POA: Diagnosis not present

## 2019-02-08 DIAGNOSIS — I5033 Acute on chronic diastolic (congestive) heart failure: Secondary | ICD-10-CM | POA: Diagnosis not present

## 2019-02-08 DIAGNOSIS — Z466 Encounter for fitting and adjustment of urinary device: Secondary | ICD-10-CM | POA: Diagnosis not present

## 2019-02-08 DIAGNOSIS — D649 Anemia, unspecified: Secondary | ICD-10-CM | POA: Diagnosis not present

## 2019-02-08 DIAGNOSIS — Z951 Presence of aortocoronary bypass graft: Secondary | ICD-10-CM | POA: Diagnosis not present

## 2019-02-08 DIAGNOSIS — I69891 Dysphagia following other cerebrovascular disease: Secondary | ICD-10-CM | POA: Diagnosis not present

## 2019-02-08 DIAGNOSIS — R159 Full incontinence of feces: Secondary | ICD-10-CM | POA: Diagnosis not present

## 2019-02-08 DIAGNOSIS — I480 Paroxysmal atrial fibrillation: Secondary | ICD-10-CM | POA: Diagnosis not present

## 2019-02-08 NOTE — Telephone Encounter (Signed)
Spoke with Jill Howell   Verbal authorization given to continue with hospice service.   FYI to Dr. Damita Dunnings.

## 2019-02-08 NOTE — Telephone Encounter (Signed)
Agreed.  Thanks.  

## 2019-02-09 DIAGNOSIS — I25118 Atherosclerotic heart disease of native coronary artery with other forms of angina pectoris: Secondary | ICD-10-CM | POA: Diagnosis not present

## 2019-02-09 DIAGNOSIS — E785 Hyperlipidemia, unspecified: Secondary | ICD-10-CM | POA: Diagnosis not present

## 2019-02-09 DIAGNOSIS — I110 Hypertensive heart disease with heart failure: Secondary | ICD-10-CM | POA: Diagnosis not present

## 2019-02-09 DIAGNOSIS — D649 Anemia, unspecified: Secondary | ICD-10-CM | POA: Diagnosis not present

## 2019-02-09 DIAGNOSIS — I480 Paroxysmal atrial fibrillation: Secondary | ICD-10-CM | POA: Diagnosis not present

## 2019-02-09 DIAGNOSIS — I5033 Acute on chronic diastolic (congestive) heart failure: Secondary | ICD-10-CM | POA: Diagnosis not present

## 2019-02-16 DIAGNOSIS — I25118 Atherosclerotic heart disease of native coronary artery with other forms of angina pectoris: Secondary | ICD-10-CM | POA: Diagnosis not present

## 2019-02-16 DIAGNOSIS — E785 Hyperlipidemia, unspecified: Secondary | ICD-10-CM | POA: Diagnosis not present

## 2019-02-16 DIAGNOSIS — I480 Paroxysmal atrial fibrillation: Secondary | ICD-10-CM | POA: Diagnosis not present

## 2019-02-16 DIAGNOSIS — I5033 Acute on chronic diastolic (congestive) heart failure: Secondary | ICD-10-CM | POA: Diagnosis not present

## 2019-02-16 DIAGNOSIS — I110 Hypertensive heart disease with heart failure: Secondary | ICD-10-CM | POA: Diagnosis not present

## 2019-02-16 DIAGNOSIS — D649 Anemia, unspecified: Secondary | ICD-10-CM | POA: Diagnosis not present

## 2019-02-24 ENCOUNTER — Other Ambulatory Visit: Payer: Self-pay | Admitting: Family Medicine

## 2019-02-25 NOTE — Telephone Encounter (Signed)
Electronic refill request.  Last office visit:   02/19/2018, no upcoming appts scheduled. Digoxin Last Filled:    90 tablet 1 09/30/2018   Isosorbide Last Filled:    30 tablet 3 11/09/2018   Docusate Sodium Last Filled:    30 capsule 3 12/23/2018   Please advise.

## 2019-02-27 NOTE — Telephone Encounter (Signed)
Please verify current use and I can send if needed.  Wouldn't need OV as she is a hospice patient.  Thanks.

## 2019-03-01 NOTE — Telephone Encounter (Signed)
Phoned home number.  Caregiver says she will ask the son [name]) to call back.

## 2019-03-01 NOTE — Telephone Encounter (Signed)
No note needed, pt's son was transferred to [facility]

## 2019-03-01 NOTE — Telephone Encounter (Signed)
Son says he called the Hospice Nurse saying that the patient needed these refills and apparently the Hospice nurse requested them thru the pharmacy.  Patient does need these medications.

## 2019-03-02 NOTE — Telephone Encounter (Signed)
Sent. Thanks.   

## 2019-03-04 DIAGNOSIS — I25118 Atherosclerotic heart disease of native coronary artery with other forms of angina pectoris: Secondary | ICD-10-CM | POA: Diagnosis not present

## 2019-03-04 DIAGNOSIS — E785 Hyperlipidemia, unspecified: Secondary | ICD-10-CM | POA: Diagnosis not present

## 2019-03-04 DIAGNOSIS — I110 Hypertensive heart disease with heart failure: Secondary | ICD-10-CM | POA: Diagnosis not present

## 2019-03-04 DIAGNOSIS — I480 Paroxysmal atrial fibrillation: Secondary | ICD-10-CM | POA: Diagnosis not present

## 2019-03-04 DIAGNOSIS — I5033 Acute on chronic diastolic (congestive) heart failure: Secondary | ICD-10-CM | POA: Diagnosis not present

## 2019-03-04 DIAGNOSIS — D649 Anemia, unspecified: Secondary | ICD-10-CM | POA: Diagnosis not present

## 2019-03-05 DIAGNOSIS — E785 Hyperlipidemia, unspecified: Secondary | ICD-10-CM | POA: Diagnosis not present

## 2019-03-05 DIAGNOSIS — D649 Anemia, unspecified: Secondary | ICD-10-CM | POA: Diagnosis not present

## 2019-03-05 DIAGNOSIS — I5033 Acute on chronic diastolic (congestive) heart failure: Secondary | ICD-10-CM | POA: Diagnosis not present

## 2019-03-05 DIAGNOSIS — I25118 Atherosclerotic heart disease of native coronary artery with other forms of angina pectoris: Secondary | ICD-10-CM | POA: Diagnosis not present

## 2019-03-05 DIAGNOSIS — I480 Paroxysmal atrial fibrillation: Secondary | ICD-10-CM | POA: Diagnosis not present

## 2019-03-05 DIAGNOSIS — I110 Hypertensive heart disease with heart failure: Secondary | ICD-10-CM | POA: Diagnosis not present

## 2019-03-10 DIAGNOSIS — I5033 Acute on chronic diastolic (congestive) heart failure: Secondary | ICD-10-CM | POA: Diagnosis not present

## 2019-03-10 DIAGNOSIS — R627 Adult failure to thrive: Secondary | ICD-10-CM | POA: Diagnosis not present

## 2019-03-10 DIAGNOSIS — K219 Gastro-esophageal reflux disease without esophagitis: Secondary | ICD-10-CM | POA: Diagnosis not present

## 2019-03-10 DIAGNOSIS — E039 Hypothyroidism, unspecified: Secondary | ICD-10-CM | POA: Diagnosis not present

## 2019-03-10 DIAGNOSIS — Z466 Encounter for fitting and adjustment of urinary device: Secondary | ICD-10-CM | POA: Diagnosis not present

## 2019-03-10 DIAGNOSIS — Z951 Presence of aortocoronary bypass graft: Secondary | ICD-10-CM | POA: Diagnosis not present

## 2019-03-10 DIAGNOSIS — E785 Hyperlipidemia, unspecified: Secondary | ICD-10-CM | POA: Diagnosis not present

## 2019-03-10 DIAGNOSIS — I69891 Dysphagia following other cerebrovascular disease: Secondary | ICD-10-CM | POA: Diagnosis not present

## 2019-03-10 DIAGNOSIS — R32 Unspecified urinary incontinence: Secondary | ICD-10-CM | POA: Diagnosis not present

## 2019-03-10 DIAGNOSIS — D649 Anemia, unspecified: Secondary | ICD-10-CM | POA: Diagnosis not present

## 2019-03-10 DIAGNOSIS — I110 Hypertensive heart disease with heart failure: Secondary | ICD-10-CM | POA: Diagnosis not present

## 2019-03-10 DIAGNOSIS — I480 Paroxysmal atrial fibrillation: Secondary | ICD-10-CM | POA: Diagnosis not present

## 2019-03-10 DIAGNOSIS — R159 Full incontinence of feces: Secondary | ICD-10-CM | POA: Diagnosis not present

## 2019-03-10 DIAGNOSIS — Z681 Body mass index (BMI) 19 or less, adult: Secondary | ICD-10-CM | POA: Diagnosis not present

## 2019-03-10 DIAGNOSIS — Z9981 Dependence on supplemental oxygen: Secondary | ICD-10-CM | POA: Diagnosis not present

## 2019-03-10 DIAGNOSIS — Z87440 Personal history of urinary (tract) infections: Secondary | ICD-10-CM | POA: Diagnosis not present

## 2019-03-10 DIAGNOSIS — D485 Neoplasm of uncertain behavior of skin: Secondary | ICD-10-CM | POA: Diagnosis not present

## 2019-03-10 DIAGNOSIS — I69818 Other symptoms and signs involving cognitive functions following other cerebrovascular disease: Secondary | ICD-10-CM | POA: Diagnosis not present

## 2019-03-10 DIAGNOSIS — I25118 Atherosclerotic heart disease of native coronary artery with other forms of angina pectoris: Secondary | ICD-10-CM | POA: Diagnosis not present

## 2019-03-15 ENCOUNTER — Other Ambulatory Visit: Payer: Self-pay | Admitting: Family Medicine

## 2019-03-15 DIAGNOSIS — I480 Paroxysmal atrial fibrillation: Secondary | ICD-10-CM | POA: Diagnosis not present

## 2019-03-15 DIAGNOSIS — I5033 Acute on chronic diastolic (congestive) heart failure: Secondary | ICD-10-CM | POA: Diagnosis not present

## 2019-03-15 DIAGNOSIS — E785 Hyperlipidemia, unspecified: Secondary | ICD-10-CM | POA: Diagnosis not present

## 2019-03-15 DIAGNOSIS — I110 Hypertensive heart disease with heart failure: Secondary | ICD-10-CM | POA: Diagnosis not present

## 2019-03-15 DIAGNOSIS — D649 Anemia, unspecified: Secondary | ICD-10-CM | POA: Diagnosis not present

## 2019-03-15 DIAGNOSIS — I25118 Atherosclerotic heart disease of native coronary artery with other forms of angina pectoris: Secondary | ICD-10-CM | POA: Diagnosis not present

## 2019-03-22 ENCOUNTER — Other Ambulatory Visit: Payer: Self-pay | Admitting: Family Medicine

## 2019-03-22 NOTE — Telephone Encounter (Signed)
Electronic refill request. Seroquel Last office visit:   02/19/18 Tower Acute Last Filled:    30 tablet 0 03/16/2019  Please advise.

## 2019-03-23 NOTE — Telephone Encounter (Signed)
Sent. Thanks.   

## 2019-03-25 DIAGNOSIS — E785 Hyperlipidemia, unspecified: Secondary | ICD-10-CM | POA: Diagnosis not present

## 2019-03-25 DIAGNOSIS — I5033 Acute on chronic diastolic (congestive) heart failure: Secondary | ICD-10-CM | POA: Diagnosis not present

## 2019-03-25 DIAGNOSIS — I110 Hypertensive heart disease with heart failure: Secondary | ICD-10-CM | POA: Diagnosis not present

## 2019-03-25 DIAGNOSIS — I25118 Atherosclerotic heart disease of native coronary artery with other forms of angina pectoris: Secondary | ICD-10-CM | POA: Diagnosis not present

## 2019-03-25 DIAGNOSIS — I480 Paroxysmal atrial fibrillation: Secondary | ICD-10-CM | POA: Diagnosis not present

## 2019-03-25 DIAGNOSIS — D649 Anemia, unspecified: Secondary | ICD-10-CM | POA: Diagnosis not present

## 2019-03-30 DIAGNOSIS — I480 Paroxysmal atrial fibrillation: Secondary | ICD-10-CM | POA: Diagnosis not present

## 2019-03-30 DIAGNOSIS — E785 Hyperlipidemia, unspecified: Secondary | ICD-10-CM | POA: Diagnosis not present

## 2019-03-30 DIAGNOSIS — I5033 Acute on chronic diastolic (congestive) heart failure: Secondary | ICD-10-CM | POA: Diagnosis not present

## 2019-03-30 DIAGNOSIS — I110 Hypertensive heart disease with heart failure: Secondary | ICD-10-CM | POA: Diagnosis not present

## 2019-03-30 DIAGNOSIS — I25118 Atherosclerotic heart disease of native coronary artery with other forms of angina pectoris: Secondary | ICD-10-CM | POA: Diagnosis not present

## 2019-03-30 DIAGNOSIS — D649 Anemia, unspecified: Secondary | ICD-10-CM | POA: Diagnosis not present

## 2019-04-06 DIAGNOSIS — D649 Anemia, unspecified: Secondary | ICD-10-CM | POA: Diagnosis not present

## 2019-04-06 DIAGNOSIS — I110 Hypertensive heart disease with heart failure: Secondary | ICD-10-CM | POA: Diagnosis not present

## 2019-04-06 DIAGNOSIS — I25118 Atherosclerotic heart disease of native coronary artery with other forms of angina pectoris: Secondary | ICD-10-CM | POA: Diagnosis not present

## 2019-04-06 DIAGNOSIS — I5033 Acute on chronic diastolic (congestive) heart failure: Secondary | ICD-10-CM | POA: Diagnosis not present

## 2019-04-06 DIAGNOSIS — E785 Hyperlipidemia, unspecified: Secondary | ICD-10-CM | POA: Diagnosis not present

## 2019-04-06 DIAGNOSIS — I480 Paroxysmal atrial fibrillation: Secondary | ICD-10-CM | POA: Diagnosis not present

## 2019-04-07 DIAGNOSIS — I25118 Atherosclerotic heart disease of native coronary artery with other forms of angina pectoris: Secondary | ICD-10-CM | POA: Diagnosis not present

## 2019-04-07 DIAGNOSIS — E785 Hyperlipidemia, unspecified: Secondary | ICD-10-CM | POA: Diagnosis not present

## 2019-04-07 DIAGNOSIS — I480 Paroxysmal atrial fibrillation: Secondary | ICD-10-CM | POA: Diagnosis not present

## 2019-04-07 DIAGNOSIS — I110 Hypertensive heart disease with heart failure: Secondary | ICD-10-CM | POA: Diagnosis not present

## 2019-04-07 DIAGNOSIS — D649 Anemia, unspecified: Secondary | ICD-10-CM | POA: Diagnosis not present

## 2019-04-07 DIAGNOSIS — I5033 Acute on chronic diastolic (congestive) heart failure: Secondary | ICD-10-CM | POA: Diagnosis not present

## 2019-04-10 DIAGNOSIS — R159 Full incontinence of feces: Secondary | ICD-10-CM | POA: Diagnosis not present

## 2019-04-10 DIAGNOSIS — I5033 Acute on chronic diastolic (congestive) heart failure: Secondary | ICD-10-CM | POA: Diagnosis not present

## 2019-04-10 DIAGNOSIS — D649 Anemia, unspecified: Secondary | ICD-10-CM | POA: Diagnosis not present

## 2019-04-10 DIAGNOSIS — Z87440 Personal history of urinary (tract) infections: Secondary | ICD-10-CM | POA: Diagnosis not present

## 2019-04-10 DIAGNOSIS — I110 Hypertensive heart disease with heart failure: Secondary | ICD-10-CM | POA: Diagnosis not present

## 2019-04-10 DIAGNOSIS — Z466 Encounter for fitting and adjustment of urinary device: Secondary | ICD-10-CM | POA: Diagnosis not present

## 2019-04-10 DIAGNOSIS — D485 Neoplasm of uncertain behavior of skin: Secondary | ICD-10-CM | POA: Diagnosis not present

## 2019-04-10 DIAGNOSIS — I480 Paroxysmal atrial fibrillation: Secondary | ICD-10-CM | POA: Diagnosis not present

## 2019-04-10 DIAGNOSIS — E785 Hyperlipidemia, unspecified: Secondary | ICD-10-CM | POA: Diagnosis not present

## 2019-04-10 DIAGNOSIS — L89152 Pressure ulcer of sacral region, stage 2: Secondary | ICD-10-CM | POA: Diagnosis not present

## 2019-04-10 DIAGNOSIS — R1310 Dysphagia, unspecified: Secondary | ICD-10-CM | POA: Diagnosis not present

## 2019-04-10 DIAGNOSIS — I69818 Other symptoms and signs involving cognitive functions following other cerebrovascular disease: Secondary | ICD-10-CM | POA: Diagnosis not present

## 2019-04-10 DIAGNOSIS — K219 Gastro-esophageal reflux disease without esophagitis: Secondary | ICD-10-CM | POA: Diagnosis not present

## 2019-04-10 DIAGNOSIS — I25118 Atherosclerotic heart disease of native coronary artery with other forms of angina pectoris: Secondary | ICD-10-CM | POA: Diagnosis not present

## 2019-04-10 DIAGNOSIS — R627 Adult failure to thrive: Secondary | ICD-10-CM | POA: Diagnosis not present

## 2019-04-10 DIAGNOSIS — E039 Hypothyroidism, unspecified: Secondary | ICD-10-CM | POA: Diagnosis not present

## 2019-04-10 DIAGNOSIS — I69891 Dysphagia following other cerebrovascular disease: Secondary | ICD-10-CM | POA: Diagnosis not present

## 2019-04-10 DIAGNOSIS — R32 Unspecified urinary incontinence: Secondary | ICD-10-CM | POA: Diagnosis not present

## 2019-04-10 DIAGNOSIS — Z9981 Dependence on supplemental oxygen: Secondary | ICD-10-CM | POA: Diagnosis not present

## 2019-04-10 DIAGNOSIS — Z681 Body mass index (BMI) 19 or less, adult: Secondary | ICD-10-CM | POA: Diagnosis not present

## 2019-04-10 DIAGNOSIS — Z951 Presence of aortocoronary bypass graft: Secondary | ICD-10-CM | POA: Diagnosis not present

## 2019-04-12 ENCOUNTER — Other Ambulatory Visit: Payer: Self-pay

## 2019-04-12 DIAGNOSIS — E785 Hyperlipidemia, unspecified: Secondary | ICD-10-CM | POA: Diagnosis not present

## 2019-04-12 DIAGNOSIS — I25118 Atherosclerotic heart disease of native coronary artery with other forms of angina pectoris: Secondary | ICD-10-CM | POA: Diagnosis not present

## 2019-04-12 DIAGNOSIS — D649 Anemia, unspecified: Secondary | ICD-10-CM | POA: Diagnosis not present

## 2019-04-12 DIAGNOSIS — I5033 Acute on chronic diastolic (congestive) heart failure: Secondary | ICD-10-CM | POA: Diagnosis not present

## 2019-04-12 DIAGNOSIS — I110 Hypertensive heart disease with heart failure: Secondary | ICD-10-CM | POA: Diagnosis not present

## 2019-04-12 DIAGNOSIS — I480 Paroxysmal atrial fibrillation: Secondary | ICD-10-CM | POA: Diagnosis not present

## 2019-04-16 DIAGNOSIS — I25118 Atherosclerotic heart disease of native coronary artery with other forms of angina pectoris: Secondary | ICD-10-CM | POA: Diagnosis not present

## 2019-04-16 DIAGNOSIS — D649 Anemia, unspecified: Secondary | ICD-10-CM | POA: Diagnosis not present

## 2019-04-16 DIAGNOSIS — I110 Hypertensive heart disease with heart failure: Secondary | ICD-10-CM | POA: Diagnosis not present

## 2019-04-16 DIAGNOSIS — I5033 Acute on chronic diastolic (congestive) heart failure: Secondary | ICD-10-CM | POA: Diagnosis not present

## 2019-04-16 DIAGNOSIS — I480 Paroxysmal atrial fibrillation: Secondary | ICD-10-CM | POA: Diagnosis not present

## 2019-04-16 DIAGNOSIS — E785 Hyperlipidemia, unspecified: Secondary | ICD-10-CM | POA: Diagnosis not present

## 2019-04-22 DIAGNOSIS — I110 Hypertensive heart disease with heart failure: Secondary | ICD-10-CM | POA: Diagnosis not present

## 2019-04-22 DIAGNOSIS — I25118 Atherosclerotic heart disease of native coronary artery with other forms of angina pectoris: Secondary | ICD-10-CM | POA: Diagnosis not present

## 2019-04-22 DIAGNOSIS — I480 Paroxysmal atrial fibrillation: Secondary | ICD-10-CM | POA: Diagnosis not present

## 2019-04-22 DIAGNOSIS — D649 Anemia, unspecified: Secondary | ICD-10-CM | POA: Diagnosis not present

## 2019-04-22 DIAGNOSIS — E785 Hyperlipidemia, unspecified: Secondary | ICD-10-CM | POA: Diagnosis not present

## 2019-04-22 DIAGNOSIS — I5033 Acute on chronic diastolic (congestive) heart failure: Secondary | ICD-10-CM | POA: Diagnosis not present

## 2019-04-27 DIAGNOSIS — I110 Hypertensive heart disease with heart failure: Secondary | ICD-10-CM | POA: Diagnosis not present

## 2019-04-27 DIAGNOSIS — I480 Paroxysmal atrial fibrillation: Secondary | ICD-10-CM | POA: Diagnosis not present

## 2019-04-27 DIAGNOSIS — I5033 Acute on chronic diastolic (congestive) heart failure: Secondary | ICD-10-CM | POA: Diagnosis not present

## 2019-04-27 DIAGNOSIS — I25118 Atherosclerotic heart disease of native coronary artery with other forms of angina pectoris: Secondary | ICD-10-CM | POA: Diagnosis not present

## 2019-04-27 DIAGNOSIS — D649 Anemia, unspecified: Secondary | ICD-10-CM | POA: Diagnosis not present

## 2019-04-27 DIAGNOSIS — E785 Hyperlipidemia, unspecified: Secondary | ICD-10-CM | POA: Diagnosis not present

## 2019-04-28 DIAGNOSIS — I5033 Acute on chronic diastolic (congestive) heart failure: Secondary | ICD-10-CM | POA: Diagnosis not present

## 2019-04-28 DIAGNOSIS — I480 Paroxysmal atrial fibrillation: Secondary | ICD-10-CM | POA: Diagnosis not present

## 2019-04-28 DIAGNOSIS — D649 Anemia, unspecified: Secondary | ICD-10-CM | POA: Diagnosis not present

## 2019-04-28 DIAGNOSIS — I110 Hypertensive heart disease with heart failure: Secondary | ICD-10-CM | POA: Diagnosis not present

## 2019-04-28 DIAGNOSIS — I25118 Atherosclerotic heart disease of native coronary artery with other forms of angina pectoris: Secondary | ICD-10-CM | POA: Diagnosis not present

## 2019-04-28 DIAGNOSIS — E785 Hyperlipidemia, unspecified: Secondary | ICD-10-CM | POA: Diagnosis not present

## 2019-05-03 DIAGNOSIS — I25118 Atherosclerotic heart disease of native coronary artery with other forms of angina pectoris: Secondary | ICD-10-CM | POA: Diagnosis not present

## 2019-05-03 DIAGNOSIS — E785 Hyperlipidemia, unspecified: Secondary | ICD-10-CM | POA: Diagnosis not present

## 2019-05-03 DIAGNOSIS — D649 Anemia, unspecified: Secondary | ICD-10-CM | POA: Diagnosis not present

## 2019-05-03 DIAGNOSIS — I5033 Acute on chronic diastolic (congestive) heart failure: Secondary | ICD-10-CM | POA: Diagnosis not present

## 2019-05-03 DIAGNOSIS — I480 Paroxysmal atrial fibrillation: Secondary | ICD-10-CM | POA: Diagnosis not present

## 2019-05-03 DIAGNOSIS — I110 Hypertensive heart disease with heart failure: Secondary | ICD-10-CM | POA: Diagnosis not present

## 2019-05-10 DIAGNOSIS — I25118 Atherosclerotic heart disease of native coronary artery with other forms of angina pectoris: Secondary | ICD-10-CM | POA: Diagnosis not present

## 2019-05-10 DIAGNOSIS — I110 Hypertensive heart disease with heart failure: Secondary | ICD-10-CM | POA: Diagnosis not present

## 2019-05-10 DIAGNOSIS — E785 Hyperlipidemia, unspecified: Secondary | ICD-10-CM | POA: Diagnosis not present

## 2019-05-10 DIAGNOSIS — I5033 Acute on chronic diastolic (congestive) heart failure: Secondary | ICD-10-CM | POA: Diagnosis not present

## 2019-05-10 DIAGNOSIS — D649 Anemia, unspecified: Secondary | ICD-10-CM | POA: Diagnosis not present

## 2019-05-10 DIAGNOSIS — I480 Paroxysmal atrial fibrillation: Secondary | ICD-10-CM | POA: Diagnosis not present

## 2019-05-11 DIAGNOSIS — R32 Unspecified urinary incontinence: Secondary | ICD-10-CM | POA: Diagnosis not present

## 2019-05-11 DIAGNOSIS — Z466 Encounter for fitting and adjustment of urinary device: Secondary | ICD-10-CM | POA: Diagnosis not present

## 2019-05-11 DIAGNOSIS — E785 Hyperlipidemia, unspecified: Secondary | ICD-10-CM | POA: Diagnosis not present

## 2019-05-11 DIAGNOSIS — K219 Gastro-esophageal reflux disease without esophagitis: Secondary | ICD-10-CM | POA: Diagnosis not present

## 2019-05-11 DIAGNOSIS — I69818 Other symptoms and signs involving cognitive functions following other cerebrovascular disease: Secondary | ICD-10-CM | POA: Diagnosis not present

## 2019-05-11 DIAGNOSIS — I25118 Atherosclerotic heart disease of native coronary artery with other forms of angina pectoris: Secondary | ICD-10-CM | POA: Diagnosis not present

## 2019-05-11 DIAGNOSIS — E039 Hypothyroidism, unspecified: Secondary | ICD-10-CM | POA: Diagnosis not present

## 2019-05-11 DIAGNOSIS — D485 Neoplasm of uncertain behavior of skin: Secondary | ICD-10-CM | POA: Diagnosis not present

## 2019-05-11 DIAGNOSIS — I480 Paroxysmal atrial fibrillation: Secondary | ICD-10-CM | POA: Diagnosis not present

## 2019-05-11 DIAGNOSIS — L89152 Pressure ulcer of sacral region, stage 2: Secondary | ICD-10-CM | POA: Diagnosis not present

## 2019-05-11 DIAGNOSIS — Z87440 Personal history of urinary (tract) infections: Secondary | ICD-10-CM | POA: Diagnosis not present

## 2019-05-11 DIAGNOSIS — R627 Adult failure to thrive: Secondary | ICD-10-CM | POA: Diagnosis not present

## 2019-05-11 DIAGNOSIS — Z681 Body mass index (BMI) 19 or less, adult: Secondary | ICD-10-CM | POA: Diagnosis not present

## 2019-05-11 DIAGNOSIS — R159 Full incontinence of feces: Secondary | ICD-10-CM | POA: Diagnosis not present

## 2019-05-11 DIAGNOSIS — I69891 Dysphagia following other cerebrovascular disease: Secondary | ICD-10-CM | POA: Diagnosis not present

## 2019-05-11 DIAGNOSIS — Z9981 Dependence on supplemental oxygen: Secondary | ICD-10-CM | POA: Diagnosis not present

## 2019-05-11 DIAGNOSIS — Z951 Presence of aortocoronary bypass graft: Secondary | ICD-10-CM | POA: Diagnosis not present

## 2019-05-11 DIAGNOSIS — I5033 Acute on chronic diastolic (congestive) heart failure: Secondary | ICD-10-CM | POA: Diagnosis not present

## 2019-05-11 DIAGNOSIS — D649 Anemia, unspecified: Secondary | ICD-10-CM | POA: Diagnosis not present

## 2019-05-11 DIAGNOSIS — R1310 Dysphagia, unspecified: Secondary | ICD-10-CM | POA: Diagnosis not present

## 2019-05-11 DIAGNOSIS — I110 Hypertensive heart disease with heart failure: Secondary | ICD-10-CM | POA: Diagnosis not present

## 2019-05-18 DIAGNOSIS — I5033 Acute on chronic diastolic (congestive) heart failure: Secondary | ICD-10-CM | POA: Diagnosis not present

## 2019-05-18 DIAGNOSIS — I110 Hypertensive heart disease with heart failure: Secondary | ICD-10-CM | POA: Diagnosis not present

## 2019-05-18 DIAGNOSIS — E785 Hyperlipidemia, unspecified: Secondary | ICD-10-CM | POA: Diagnosis not present

## 2019-05-18 DIAGNOSIS — I480 Paroxysmal atrial fibrillation: Secondary | ICD-10-CM | POA: Diagnosis not present

## 2019-05-18 DIAGNOSIS — D649 Anemia, unspecified: Secondary | ICD-10-CM | POA: Diagnosis not present

## 2019-05-18 DIAGNOSIS — I25118 Atherosclerotic heart disease of native coronary artery with other forms of angina pectoris: Secondary | ICD-10-CM | POA: Diagnosis not present

## 2019-05-19 DIAGNOSIS — I480 Paroxysmal atrial fibrillation: Secondary | ICD-10-CM | POA: Diagnosis not present

## 2019-05-19 DIAGNOSIS — E785 Hyperlipidemia, unspecified: Secondary | ICD-10-CM | POA: Diagnosis not present

## 2019-05-19 DIAGNOSIS — D649 Anemia, unspecified: Secondary | ICD-10-CM | POA: Diagnosis not present

## 2019-05-19 DIAGNOSIS — I110 Hypertensive heart disease with heart failure: Secondary | ICD-10-CM | POA: Diagnosis not present

## 2019-05-19 DIAGNOSIS — I5033 Acute on chronic diastolic (congestive) heart failure: Secondary | ICD-10-CM | POA: Diagnosis not present

## 2019-05-19 DIAGNOSIS — I25118 Atherosclerotic heart disease of native coronary artery with other forms of angina pectoris: Secondary | ICD-10-CM | POA: Diagnosis not present

## 2019-05-25 ENCOUNTER — Other Ambulatory Visit: Payer: Self-pay | Admitting: Family Medicine

## 2019-05-25 DIAGNOSIS — E785 Hyperlipidemia, unspecified: Secondary | ICD-10-CM | POA: Diagnosis not present

## 2019-05-25 DIAGNOSIS — I480 Paroxysmal atrial fibrillation: Secondary | ICD-10-CM | POA: Diagnosis not present

## 2019-05-25 DIAGNOSIS — I110 Hypertensive heart disease with heart failure: Secondary | ICD-10-CM | POA: Diagnosis not present

## 2019-05-25 DIAGNOSIS — I5033 Acute on chronic diastolic (congestive) heart failure: Secondary | ICD-10-CM | POA: Diagnosis not present

## 2019-05-25 DIAGNOSIS — D649 Anemia, unspecified: Secondary | ICD-10-CM | POA: Diagnosis not present

## 2019-05-25 DIAGNOSIS — I25118 Atherosclerotic heart disease of native coronary artery with other forms of angina pectoris: Secondary | ICD-10-CM | POA: Diagnosis not present

## 2019-05-25 NOTE — Telephone Encounter (Signed)
Electronic refill request. Levothyroxine Last office visit:   02/19/2018 Acute Tower Last Filled:    90 tablet 3 02/16/2018  Please advise.

## 2019-05-26 NOTE — Telephone Encounter (Signed)
Sent. Thanks.   

## 2019-06-03 DIAGNOSIS — I5033 Acute on chronic diastolic (congestive) heart failure: Secondary | ICD-10-CM | POA: Diagnosis not present

## 2019-06-03 DIAGNOSIS — I480 Paroxysmal atrial fibrillation: Secondary | ICD-10-CM | POA: Diagnosis not present

## 2019-06-03 DIAGNOSIS — E785 Hyperlipidemia, unspecified: Secondary | ICD-10-CM | POA: Diagnosis not present

## 2019-06-03 DIAGNOSIS — D649 Anemia, unspecified: Secondary | ICD-10-CM | POA: Diagnosis not present

## 2019-06-03 DIAGNOSIS — I25118 Atherosclerotic heart disease of native coronary artery with other forms of angina pectoris: Secondary | ICD-10-CM | POA: Diagnosis not present

## 2019-06-03 DIAGNOSIS — I110 Hypertensive heart disease with heart failure: Secondary | ICD-10-CM | POA: Diagnosis not present

## 2019-06-08 DIAGNOSIS — D649 Anemia, unspecified: Secondary | ICD-10-CM | POA: Diagnosis not present

## 2019-06-08 DIAGNOSIS — I5033 Acute on chronic diastolic (congestive) heart failure: Secondary | ICD-10-CM | POA: Diagnosis not present

## 2019-06-08 DIAGNOSIS — I480 Paroxysmal atrial fibrillation: Secondary | ICD-10-CM | POA: Diagnosis not present

## 2019-06-08 DIAGNOSIS — I110 Hypertensive heart disease with heart failure: Secondary | ICD-10-CM | POA: Diagnosis not present

## 2019-06-08 DIAGNOSIS — I25118 Atherosclerotic heart disease of native coronary artery with other forms of angina pectoris: Secondary | ICD-10-CM | POA: Diagnosis not present

## 2019-06-08 DIAGNOSIS — E785 Hyperlipidemia, unspecified: Secondary | ICD-10-CM | POA: Diagnosis not present

## 2019-06-10 DIAGNOSIS — Z87440 Personal history of urinary (tract) infections: Secondary | ICD-10-CM | POA: Diagnosis not present

## 2019-06-10 DIAGNOSIS — D649 Anemia, unspecified: Secondary | ICD-10-CM | POA: Diagnosis not present

## 2019-06-10 DIAGNOSIS — R627 Adult failure to thrive: Secondary | ICD-10-CM | POA: Diagnosis not present

## 2019-06-10 DIAGNOSIS — Z951 Presence of aortocoronary bypass graft: Secondary | ICD-10-CM | POA: Diagnosis not present

## 2019-06-10 DIAGNOSIS — Z681 Body mass index (BMI) 19 or less, adult: Secondary | ICD-10-CM | POA: Diagnosis not present

## 2019-06-10 DIAGNOSIS — I5033 Acute on chronic diastolic (congestive) heart failure: Secondary | ICD-10-CM | POA: Diagnosis not present

## 2019-06-10 DIAGNOSIS — F0150 Vascular dementia without behavioral disturbance: Secondary | ICD-10-CM | POA: Diagnosis not present

## 2019-06-10 DIAGNOSIS — D485 Neoplasm of uncertain behavior of skin: Secondary | ICD-10-CM | POA: Diagnosis not present

## 2019-06-10 DIAGNOSIS — I480 Paroxysmal atrial fibrillation: Secondary | ICD-10-CM | POA: Diagnosis not present

## 2019-06-10 DIAGNOSIS — I110 Hypertensive heart disease with heart failure: Secondary | ICD-10-CM | POA: Diagnosis not present

## 2019-06-10 DIAGNOSIS — I69891 Dysphagia following other cerebrovascular disease: Secondary | ICD-10-CM | POA: Diagnosis not present

## 2019-06-10 DIAGNOSIS — R1310 Dysphagia, unspecified: Secondary | ICD-10-CM | POA: Diagnosis not present

## 2019-06-10 DIAGNOSIS — Z9981 Dependence on supplemental oxygen: Secondary | ICD-10-CM | POA: Diagnosis not present

## 2019-06-10 DIAGNOSIS — R159 Full incontinence of feces: Secondary | ICD-10-CM | POA: Diagnosis not present

## 2019-06-10 DIAGNOSIS — R32 Unspecified urinary incontinence: Secondary | ICD-10-CM | POA: Diagnosis not present

## 2019-06-10 DIAGNOSIS — K219 Gastro-esophageal reflux disease without esophagitis: Secondary | ICD-10-CM | POA: Diagnosis not present

## 2019-06-10 DIAGNOSIS — E039 Hypothyroidism, unspecified: Secondary | ICD-10-CM | POA: Diagnosis not present

## 2019-06-10 DIAGNOSIS — I25118 Atherosclerotic heart disease of native coronary artery with other forms of angina pectoris: Secondary | ICD-10-CM | POA: Diagnosis not present

## 2019-06-10 DIAGNOSIS — L89152 Pressure ulcer of sacral region, stage 2: Secondary | ICD-10-CM | POA: Diagnosis not present

## 2019-06-10 DIAGNOSIS — E785 Hyperlipidemia, unspecified: Secondary | ICD-10-CM | POA: Diagnosis not present

## 2019-06-10 DIAGNOSIS — Z466 Encounter for fitting and adjustment of urinary device: Secondary | ICD-10-CM | POA: Diagnosis not present

## 2019-06-10 DIAGNOSIS — I69818 Other symptoms and signs involving cognitive functions following other cerebrovascular disease: Secondary | ICD-10-CM | POA: Diagnosis not present

## 2019-06-14 DIAGNOSIS — D649 Anemia, unspecified: Secondary | ICD-10-CM | POA: Diagnosis not present

## 2019-06-14 DIAGNOSIS — I5033 Acute on chronic diastolic (congestive) heart failure: Secondary | ICD-10-CM | POA: Diagnosis not present

## 2019-06-14 DIAGNOSIS — I25118 Atherosclerotic heart disease of native coronary artery with other forms of angina pectoris: Secondary | ICD-10-CM | POA: Diagnosis not present

## 2019-06-14 DIAGNOSIS — E785 Hyperlipidemia, unspecified: Secondary | ICD-10-CM | POA: Diagnosis not present

## 2019-06-14 DIAGNOSIS — I480 Paroxysmal atrial fibrillation: Secondary | ICD-10-CM | POA: Diagnosis not present

## 2019-06-14 DIAGNOSIS — I110 Hypertensive heart disease with heart failure: Secondary | ICD-10-CM | POA: Diagnosis not present

## 2019-06-15 DIAGNOSIS — I110 Hypertensive heart disease with heart failure: Secondary | ICD-10-CM | POA: Diagnosis not present

## 2019-06-15 DIAGNOSIS — D649 Anemia, unspecified: Secondary | ICD-10-CM | POA: Diagnosis not present

## 2019-06-15 DIAGNOSIS — E785 Hyperlipidemia, unspecified: Secondary | ICD-10-CM | POA: Diagnosis not present

## 2019-06-15 DIAGNOSIS — I5033 Acute on chronic diastolic (congestive) heart failure: Secondary | ICD-10-CM | POA: Diagnosis not present

## 2019-06-15 DIAGNOSIS — I25118 Atherosclerotic heart disease of native coronary artery with other forms of angina pectoris: Secondary | ICD-10-CM | POA: Diagnosis not present

## 2019-06-15 DIAGNOSIS — I480 Paroxysmal atrial fibrillation: Secondary | ICD-10-CM | POA: Diagnosis not present

## 2019-06-24 DIAGNOSIS — I5033 Acute on chronic diastolic (congestive) heart failure: Secondary | ICD-10-CM | POA: Diagnosis not present

## 2019-06-24 DIAGNOSIS — I25118 Atherosclerotic heart disease of native coronary artery with other forms of angina pectoris: Secondary | ICD-10-CM | POA: Diagnosis not present

## 2019-06-24 DIAGNOSIS — E785 Hyperlipidemia, unspecified: Secondary | ICD-10-CM | POA: Diagnosis not present

## 2019-06-24 DIAGNOSIS — D649 Anemia, unspecified: Secondary | ICD-10-CM | POA: Diagnosis not present

## 2019-06-24 DIAGNOSIS — I480 Paroxysmal atrial fibrillation: Secondary | ICD-10-CM | POA: Diagnosis not present

## 2019-06-24 DIAGNOSIS — I110 Hypertensive heart disease with heart failure: Secondary | ICD-10-CM | POA: Diagnosis not present

## 2019-07-02 DIAGNOSIS — I480 Paroxysmal atrial fibrillation: Secondary | ICD-10-CM | POA: Diagnosis not present

## 2019-07-02 DIAGNOSIS — D649 Anemia, unspecified: Secondary | ICD-10-CM | POA: Diagnosis not present

## 2019-07-02 DIAGNOSIS — I5033 Acute on chronic diastolic (congestive) heart failure: Secondary | ICD-10-CM | POA: Diagnosis not present

## 2019-07-02 DIAGNOSIS — I110 Hypertensive heart disease with heart failure: Secondary | ICD-10-CM | POA: Diagnosis not present

## 2019-07-02 DIAGNOSIS — E785 Hyperlipidemia, unspecified: Secondary | ICD-10-CM | POA: Diagnosis not present

## 2019-07-02 DIAGNOSIS — I25118 Atherosclerotic heart disease of native coronary artery with other forms of angina pectoris: Secondary | ICD-10-CM | POA: Diagnosis not present

## 2019-07-07 DIAGNOSIS — I480 Paroxysmal atrial fibrillation: Secondary | ICD-10-CM | POA: Diagnosis not present

## 2019-07-07 DIAGNOSIS — I110 Hypertensive heart disease with heart failure: Secondary | ICD-10-CM | POA: Diagnosis not present

## 2019-07-07 DIAGNOSIS — E785 Hyperlipidemia, unspecified: Secondary | ICD-10-CM | POA: Diagnosis not present

## 2019-07-07 DIAGNOSIS — D649 Anemia, unspecified: Secondary | ICD-10-CM | POA: Diagnosis not present

## 2019-07-07 DIAGNOSIS — I5033 Acute on chronic diastolic (congestive) heart failure: Secondary | ICD-10-CM | POA: Diagnosis not present

## 2019-07-07 DIAGNOSIS — I25118 Atherosclerotic heart disease of native coronary artery with other forms of angina pectoris: Secondary | ICD-10-CM | POA: Diagnosis not present

## 2019-07-11 DIAGNOSIS — R159 Full incontinence of feces: Secondary | ICD-10-CM | POA: Diagnosis not present

## 2019-07-11 DIAGNOSIS — F0150 Vascular dementia without behavioral disturbance: Secondary | ICD-10-CM | POA: Diagnosis not present

## 2019-07-11 DIAGNOSIS — Z466 Encounter for fitting and adjustment of urinary device: Secondary | ICD-10-CM | POA: Diagnosis not present

## 2019-07-11 DIAGNOSIS — Z951 Presence of aortocoronary bypass graft: Secondary | ICD-10-CM | POA: Diagnosis not present

## 2019-07-11 DIAGNOSIS — Z681 Body mass index (BMI) 19 or less, adult: Secondary | ICD-10-CM | POA: Diagnosis not present

## 2019-07-11 DIAGNOSIS — R32 Unspecified urinary incontinence: Secondary | ICD-10-CM | POA: Diagnosis not present

## 2019-07-11 DIAGNOSIS — I25118 Atherosclerotic heart disease of native coronary artery with other forms of angina pectoris: Secondary | ICD-10-CM | POA: Diagnosis not present

## 2019-07-11 DIAGNOSIS — D649 Anemia, unspecified: Secondary | ICD-10-CM | POA: Diagnosis not present

## 2019-07-11 DIAGNOSIS — K219 Gastro-esophageal reflux disease without esophagitis: Secondary | ICD-10-CM | POA: Diagnosis not present

## 2019-07-11 DIAGNOSIS — R1310 Dysphagia, unspecified: Secondary | ICD-10-CM | POA: Diagnosis not present

## 2019-07-11 DIAGNOSIS — I69818 Other symptoms and signs involving cognitive functions following other cerebrovascular disease: Secondary | ICD-10-CM | POA: Diagnosis not present

## 2019-07-11 DIAGNOSIS — E039 Hypothyroidism, unspecified: Secondary | ICD-10-CM | POA: Diagnosis not present

## 2019-07-11 DIAGNOSIS — I110 Hypertensive heart disease with heart failure: Secondary | ICD-10-CM | POA: Diagnosis not present

## 2019-07-11 DIAGNOSIS — Z87440 Personal history of urinary (tract) infections: Secondary | ICD-10-CM | POA: Diagnosis not present

## 2019-07-11 DIAGNOSIS — I69891 Dysphagia following other cerebrovascular disease: Secondary | ICD-10-CM | POA: Diagnosis not present

## 2019-07-11 DIAGNOSIS — L89152 Pressure ulcer of sacral region, stage 2: Secondary | ICD-10-CM | POA: Diagnosis not present

## 2019-07-11 DIAGNOSIS — Z9981 Dependence on supplemental oxygen: Secondary | ICD-10-CM | POA: Diagnosis not present

## 2019-07-11 DIAGNOSIS — I5033 Acute on chronic diastolic (congestive) heart failure: Secondary | ICD-10-CM | POA: Diagnosis not present

## 2019-07-11 DIAGNOSIS — R627 Adult failure to thrive: Secondary | ICD-10-CM | POA: Diagnosis not present

## 2019-07-11 DIAGNOSIS — I480 Paroxysmal atrial fibrillation: Secondary | ICD-10-CM | POA: Diagnosis not present

## 2019-07-11 DIAGNOSIS — E785 Hyperlipidemia, unspecified: Secondary | ICD-10-CM | POA: Diagnosis not present

## 2019-07-11 DIAGNOSIS — D485 Neoplasm of uncertain behavior of skin: Secondary | ICD-10-CM | POA: Diagnosis not present

## 2019-07-13 DIAGNOSIS — I480 Paroxysmal atrial fibrillation: Secondary | ICD-10-CM | POA: Diagnosis not present

## 2019-07-13 DIAGNOSIS — I5033 Acute on chronic diastolic (congestive) heart failure: Secondary | ICD-10-CM | POA: Diagnosis not present

## 2019-07-13 DIAGNOSIS — E785 Hyperlipidemia, unspecified: Secondary | ICD-10-CM | POA: Diagnosis not present

## 2019-07-13 DIAGNOSIS — D649 Anemia, unspecified: Secondary | ICD-10-CM | POA: Diagnosis not present

## 2019-07-13 DIAGNOSIS — I25118 Atherosclerotic heart disease of native coronary artery with other forms of angina pectoris: Secondary | ICD-10-CM | POA: Diagnosis not present

## 2019-07-13 DIAGNOSIS — I110 Hypertensive heart disease with heart failure: Secondary | ICD-10-CM | POA: Diagnosis not present

## 2019-07-22 DIAGNOSIS — I480 Paroxysmal atrial fibrillation: Secondary | ICD-10-CM | POA: Diagnosis not present

## 2019-07-22 DIAGNOSIS — D649 Anemia, unspecified: Secondary | ICD-10-CM | POA: Diagnosis not present

## 2019-07-22 DIAGNOSIS — I110 Hypertensive heart disease with heart failure: Secondary | ICD-10-CM | POA: Diagnosis not present

## 2019-07-22 DIAGNOSIS — I25118 Atherosclerotic heart disease of native coronary artery with other forms of angina pectoris: Secondary | ICD-10-CM | POA: Diagnosis not present

## 2019-07-22 DIAGNOSIS — E785 Hyperlipidemia, unspecified: Secondary | ICD-10-CM | POA: Diagnosis not present

## 2019-07-22 DIAGNOSIS — I5033 Acute on chronic diastolic (congestive) heart failure: Secondary | ICD-10-CM | POA: Diagnosis not present

## 2019-07-26 ENCOUNTER — Other Ambulatory Visit: Payer: Self-pay | Admitting: Family Medicine

## 2019-07-26 NOTE — Telephone Encounter (Signed)
Electronic refill request. Quetiapine Last office visit:   02/19/18 Acute Tower Last Filled:    30 tablet 2 03/23/2019  Please advise.

## 2019-07-27 NOTE — Telephone Encounter (Signed)
Sent. Thanks.   

## 2019-08-04 DIAGNOSIS — E785 Hyperlipidemia, unspecified: Secondary | ICD-10-CM | POA: Diagnosis not present

## 2019-08-04 DIAGNOSIS — D649 Anemia, unspecified: Secondary | ICD-10-CM | POA: Diagnosis not present

## 2019-08-04 DIAGNOSIS — I25118 Atherosclerotic heart disease of native coronary artery with other forms of angina pectoris: Secondary | ICD-10-CM | POA: Diagnosis not present

## 2019-08-04 DIAGNOSIS — I110 Hypertensive heart disease with heart failure: Secondary | ICD-10-CM | POA: Diagnosis not present

## 2019-08-04 DIAGNOSIS — I5033 Acute on chronic diastolic (congestive) heart failure: Secondary | ICD-10-CM | POA: Diagnosis not present

## 2019-08-04 DIAGNOSIS — I480 Paroxysmal atrial fibrillation: Secondary | ICD-10-CM | POA: Diagnosis not present

## 2019-08-25 ENCOUNTER — Other Ambulatory Visit: Payer: Self-pay | Admitting: Family Medicine

## 2019-08-26 NOTE — Telephone Encounter (Signed)
Electronic refill request. Imdur Last office visit:   02/19/18 Acute - Tower Last Filled:    90 tablet 1 03/02/2019  Please advise.   ? Needs appointment.

## 2019-08-27 NOTE — Telephone Encounter (Signed)
Sent. Thanks.   Would continue as is.

## 2019-08-31 ENCOUNTER — Other Ambulatory Visit: Payer: Self-pay | Admitting: Family Medicine

## 2019-09-10 DIAGNOSIS — E785 Hyperlipidemia, unspecified: Secondary | ICD-10-CM | POA: Diagnosis not present

## 2019-09-10 DIAGNOSIS — E039 Hypothyroidism, unspecified: Secondary | ICD-10-CM | POA: Diagnosis not present

## 2019-09-10 DIAGNOSIS — Z951 Presence of aortocoronary bypass graft: Secondary | ICD-10-CM | POA: Diagnosis not present

## 2019-09-10 DIAGNOSIS — I25118 Atherosclerotic heart disease of native coronary artery with other forms of angina pectoris: Secondary | ICD-10-CM | POA: Diagnosis not present

## 2019-09-10 DIAGNOSIS — Z466 Encounter for fitting and adjustment of urinary device: Secondary | ICD-10-CM | POA: Diagnosis not present

## 2019-09-10 DIAGNOSIS — I69818 Other symptoms and signs involving cognitive functions following other cerebrovascular disease: Secondary | ICD-10-CM | POA: Diagnosis not present

## 2019-09-10 DIAGNOSIS — K219 Gastro-esophageal reflux disease without esophagitis: Secondary | ICD-10-CM | POA: Diagnosis not present

## 2019-09-10 DIAGNOSIS — Z9981 Dependence on supplemental oxygen: Secondary | ICD-10-CM | POA: Diagnosis not present

## 2019-09-10 DIAGNOSIS — F0150 Vascular dementia without behavioral disturbance: Secondary | ICD-10-CM | POA: Diagnosis not present

## 2019-09-10 DIAGNOSIS — Z87440 Personal history of urinary (tract) infections: Secondary | ICD-10-CM | POA: Diagnosis not present

## 2019-09-10 DIAGNOSIS — Z681 Body mass index (BMI) 19 or less, adult: Secondary | ICD-10-CM | POA: Diagnosis not present

## 2019-09-10 DIAGNOSIS — D485 Neoplasm of uncertain behavior of skin: Secondary | ICD-10-CM | POA: Diagnosis not present

## 2019-09-10 DIAGNOSIS — R32 Unspecified urinary incontinence: Secondary | ICD-10-CM | POA: Diagnosis not present

## 2019-09-10 DIAGNOSIS — I480 Paroxysmal atrial fibrillation: Secondary | ICD-10-CM | POA: Diagnosis not present

## 2019-09-10 DIAGNOSIS — I5033 Acute on chronic diastolic (congestive) heart failure: Secondary | ICD-10-CM | POA: Diagnosis not present

## 2019-09-10 DIAGNOSIS — D649 Anemia, unspecified: Secondary | ICD-10-CM | POA: Diagnosis not present

## 2019-09-10 DIAGNOSIS — R627 Adult failure to thrive: Secondary | ICD-10-CM | POA: Diagnosis not present

## 2019-09-10 DIAGNOSIS — I69891 Dysphagia following other cerebrovascular disease: Secondary | ICD-10-CM | POA: Diagnosis not present

## 2019-09-10 DIAGNOSIS — R1310 Dysphagia, unspecified: Secondary | ICD-10-CM | POA: Diagnosis not present

## 2019-09-10 DIAGNOSIS — I110 Hypertensive heart disease with heart failure: Secondary | ICD-10-CM | POA: Diagnosis not present

## 2019-09-10 DIAGNOSIS — R159 Full incontinence of feces: Secondary | ICD-10-CM | POA: Diagnosis not present

## 2019-09-16 DIAGNOSIS — I5033 Acute on chronic diastolic (congestive) heart failure: Secondary | ICD-10-CM | POA: Diagnosis not present

## 2019-09-16 DIAGNOSIS — I480 Paroxysmal atrial fibrillation: Secondary | ICD-10-CM | POA: Diagnosis not present

## 2019-09-16 DIAGNOSIS — I110 Hypertensive heart disease with heart failure: Secondary | ICD-10-CM | POA: Diagnosis not present

## 2019-09-16 DIAGNOSIS — D649 Anemia, unspecified: Secondary | ICD-10-CM | POA: Diagnosis not present

## 2019-09-16 DIAGNOSIS — E785 Hyperlipidemia, unspecified: Secondary | ICD-10-CM | POA: Diagnosis not present

## 2019-09-16 DIAGNOSIS — I25118 Atherosclerotic heart disease of native coronary artery with other forms of angina pectoris: Secondary | ICD-10-CM | POA: Diagnosis not present

## 2019-09-21 DIAGNOSIS — I5033 Acute on chronic diastolic (congestive) heart failure: Secondary | ICD-10-CM | POA: Diagnosis not present

## 2019-09-21 DIAGNOSIS — I480 Paroxysmal atrial fibrillation: Secondary | ICD-10-CM | POA: Diagnosis not present

## 2019-09-21 DIAGNOSIS — D649 Anemia, unspecified: Secondary | ICD-10-CM | POA: Diagnosis not present

## 2019-09-21 DIAGNOSIS — I25118 Atherosclerotic heart disease of native coronary artery with other forms of angina pectoris: Secondary | ICD-10-CM | POA: Diagnosis not present

## 2019-09-21 DIAGNOSIS — I110 Hypertensive heart disease with heart failure: Secondary | ICD-10-CM | POA: Diagnosis not present

## 2019-09-21 DIAGNOSIS — E785 Hyperlipidemia, unspecified: Secondary | ICD-10-CM | POA: Diagnosis not present

## 2019-09-29 DIAGNOSIS — D649 Anemia, unspecified: Secondary | ICD-10-CM | POA: Diagnosis not present

## 2019-09-29 DIAGNOSIS — I25118 Atherosclerotic heart disease of native coronary artery with other forms of angina pectoris: Secondary | ICD-10-CM | POA: Diagnosis not present

## 2019-09-29 DIAGNOSIS — E785 Hyperlipidemia, unspecified: Secondary | ICD-10-CM | POA: Diagnosis not present

## 2019-09-29 DIAGNOSIS — I5033 Acute on chronic diastolic (congestive) heart failure: Secondary | ICD-10-CM | POA: Diagnosis not present

## 2019-09-29 DIAGNOSIS — I110 Hypertensive heart disease with heart failure: Secondary | ICD-10-CM | POA: Diagnosis not present

## 2019-09-29 DIAGNOSIS — I480 Paroxysmal atrial fibrillation: Secondary | ICD-10-CM | POA: Diagnosis not present

## 2019-10-01 ENCOUNTER — Other Ambulatory Visit: Payer: Self-pay | Admitting: Family Medicine

## 2019-10-04 NOTE — Telephone Encounter (Signed)
Electronic refill request. Digoxin Last office visit:   02/19/2018 Last Filled:     90 tablet 1 03/02/2019  Please advise.

## 2019-10-05 DIAGNOSIS — I480 Paroxysmal atrial fibrillation: Secondary | ICD-10-CM | POA: Diagnosis not present

## 2019-10-05 DIAGNOSIS — I5033 Acute on chronic diastolic (congestive) heart failure: Secondary | ICD-10-CM | POA: Diagnosis not present

## 2019-10-05 DIAGNOSIS — E785 Hyperlipidemia, unspecified: Secondary | ICD-10-CM | POA: Diagnosis not present

## 2019-10-05 DIAGNOSIS — I25118 Atherosclerotic heart disease of native coronary artery with other forms of angina pectoris: Secondary | ICD-10-CM | POA: Diagnosis not present

## 2019-10-05 DIAGNOSIS — I110 Hypertensive heart disease with heart failure: Secondary | ICD-10-CM | POA: Diagnosis not present

## 2019-10-05 DIAGNOSIS — D649 Anemia, unspecified: Secondary | ICD-10-CM | POA: Diagnosis not present

## 2019-10-05 NOTE — Telephone Encounter (Signed)
Sent. Thanks.   

## 2019-10-07 DIAGNOSIS — I5033 Acute on chronic diastolic (congestive) heart failure: Secondary | ICD-10-CM | POA: Diagnosis not present

## 2019-10-07 DIAGNOSIS — I480 Paroxysmal atrial fibrillation: Secondary | ICD-10-CM | POA: Diagnosis not present

## 2019-10-07 DIAGNOSIS — E785 Hyperlipidemia, unspecified: Secondary | ICD-10-CM | POA: Diagnosis not present

## 2019-10-07 DIAGNOSIS — I25118 Atherosclerotic heart disease of native coronary artery with other forms of angina pectoris: Secondary | ICD-10-CM | POA: Diagnosis not present

## 2019-10-07 DIAGNOSIS — D649 Anemia, unspecified: Secondary | ICD-10-CM | POA: Diagnosis not present

## 2019-10-07 DIAGNOSIS — I110 Hypertensive heart disease with heart failure: Secondary | ICD-10-CM | POA: Diagnosis not present

## 2019-10-11 DIAGNOSIS — Z466 Encounter for fitting and adjustment of urinary device: Secondary | ICD-10-CM | POA: Diagnosis not present

## 2019-10-11 DIAGNOSIS — I480 Paroxysmal atrial fibrillation: Secondary | ICD-10-CM | POA: Diagnosis not present

## 2019-10-11 DIAGNOSIS — K219 Gastro-esophageal reflux disease without esophagitis: Secondary | ICD-10-CM | POA: Diagnosis not present

## 2019-10-11 DIAGNOSIS — I25118 Atherosclerotic heart disease of native coronary artery with other forms of angina pectoris: Secondary | ICD-10-CM | POA: Diagnosis not present

## 2019-10-11 DIAGNOSIS — E785 Hyperlipidemia, unspecified: Secondary | ICD-10-CM | POA: Diagnosis not present

## 2019-10-11 DIAGNOSIS — F0150 Vascular dementia without behavioral disturbance: Secondary | ICD-10-CM | POA: Diagnosis not present

## 2019-10-11 DIAGNOSIS — R159 Full incontinence of feces: Secondary | ICD-10-CM | POA: Diagnosis not present

## 2019-10-11 DIAGNOSIS — R32 Unspecified urinary incontinence: Secondary | ICD-10-CM | POA: Diagnosis not present

## 2019-10-11 DIAGNOSIS — E039 Hypothyroidism, unspecified: Secondary | ICD-10-CM | POA: Diagnosis not present

## 2019-10-11 DIAGNOSIS — D485 Neoplasm of uncertain behavior of skin: Secondary | ICD-10-CM | POA: Diagnosis not present

## 2019-10-11 DIAGNOSIS — Z951 Presence of aortocoronary bypass graft: Secondary | ICD-10-CM | POA: Diagnosis not present

## 2019-10-11 DIAGNOSIS — Z87440 Personal history of urinary (tract) infections: Secondary | ICD-10-CM | POA: Diagnosis not present

## 2019-10-11 DIAGNOSIS — R441 Visual hallucinations: Secondary | ICD-10-CM | POA: Diagnosis not present

## 2019-10-11 DIAGNOSIS — I5033 Acute on chronic diastolic (congestive) heart failure: Secondary | ICD-10-CM | POA: Diagnosis not present

## 2019-10-11 DIAGNOSIS — I69822 Dysarthria following other cerebrovascular disease: Secondary | ICD-10-CM | POA: Diagnosis not present

## 2019-10-11 DIAGNOSIS — I69818 Other symptoms and signs involving cognitive functions following other cerebrovascular disease: Secondary | ICD-10-CM | POA: Diagnosis not present

## 2019-10-11 DIAGNOSIS — Z9981 Dependence on supplemental oxygen: Secondary | ICD-10-CM | POA: Diagnosis not present

## 2019-10-11 DIAGNOSIS — R319 Hematuria, unspecified: Secondary | ICD-10-CM | POA: Diagnosis not present

## 2019-10-11 DIAGNOSIS — R627 Adult failure to thrive: Secondary | ICD-10-CM | POA: Diagnosis not present

## 2019-10-11 DIAGNOSIS — D649 Anemia, unspecified: Secondary | ICD-10-CM | POA: Diagnosis not present

## 2019-10-11 DIAGNOSIS — I69891 Dysphagia following other cerebrovascular disease: Secondary | ICD-10-CM | POA: Diagnosis not present

## 2019-10-11 DIAGNOSIS — Z681 Body mass index (BMI) 19 or less, adult: Secondary | ICD-10-CM | POA: Diagnosis not present

## 2019-10-11 DIAGNOSIS — I110 Hypertensive heart disease with heart failure: Secondary | ICD-10-CM | POA: Diagnosis not present

## 2019-10-12 DIAGNOSIS — I110 Hypertensive heart disease with heart failure: Secondary | ICD-10-CM | POA: Diagnosis not present

## 2019-10-12 DIAGNOSIS — I5033 Acute on chronic diastolic (congestive) heart failure: Secondary | ICD-10-CM | POA: Diagnosis not present

## 2019-10-12 DIAGNOSIS — I25118 Atherosclerotic heart disease of native coronary artery with other forms of angina pectoris: Secondary | ICD-10-CM | POA: Diagnosis not present

## 2019-10-12 DIAGNOSIS — D649 Anemia, unspecified: Secondary | ICD-10-CM | POA: Diagnosis not present

## 2019-10-12 DIAGNOSIS — I480 Paroxysmal atrial fibrillation: Secondary | ICD-10-CM | POA: Diagnosis not present

## 2019-10-12 DIAGNOSIS — E785 Hyperlipidemia, unspecified: Secondary | ICD-10-CM | POA: Diagnosis not present

## 2019-10-13 DIAGNOSIS — I25118 Atherosclerotic heart disease of native coronary artery with other forms of angina pectoris: Secondary | ICD-10-CM | POA: Diagnosis not present

## 2019-10-13 DIAGNOSIS — I480 Paroxysmal atrial fibrillation: Secondary | ICD-10-CM | POA: Diagnosis not present

## 2019-10-13 DIAGNOSIS — E785 Hyperlipidemia, unspecified: Secondary | ICD-10-CM | POA: Diagnosis not present

## 2019-10-13 DIAGNOSIS — D649 Anemia, unspecified: Secondary | ICD-10-CM | POA: Diagnosis not present

## 2019-10-13 DIAGNOSIS — I5033 Acute on chronic diastolic (congestive) heart failure: Secondary | ICD-10-CM | POA: Diagnosis not present

## 2019-10-13 DIAGNOSIS — I110 Hypertensive heart disease with heart failure: Secondary | ICD-10-CM | POA: Diagnosis not present

## 2019-10-14 DIAGNOSIS — I25118 Atherosclerotic heart disease of native coronary artery with other forms of angina pectoris: Secondary | ICD-10-CM | POA: Diagnosis not present

## 2019-10-14 DIAGNOSIS — E785 Hyperlipidemia, unspecified: Secondary | ICD-10-CM | POA: Diagnosis not present

## 2019-10-14 DIAGNOSIS — I480 Paroxysmal atrial fibrillation: Secondary | ICD-10-CM | POA: Diagnosis not present

## 2019-10-14 DIAGNOSIS — I5033 Acute on chronic diastolic (congestive) heart failure: Secondary | ICD-10-CM | POA: Diagnosis not present

## 2019-10-14 DIAGNOSIS — I110 Hypertensive heart disease with heart failure: Secondary | ICD-10-CM | POA: Diagnosis not present

## 2019-10-14 DIAGNOSIS — D649 Anemia, unspecified: Secondary | ICD-10-CM | POA: Diagnosis not present

## 2019-10-15 DIAGNOSIS — I25118 Atherosclerotic heart disease of native coronary artery with other forms of angina pectoris: Secondary | ICD-10-CM | POA: Diagnosis not present

## 2019-10-15 DIAGNOSIS — I5033 Acute on chronic diastolic (congestive) heart failure: Secondary | ICD-10-CM | POA: Diagnosis not present

## 2019-10-15 DIAGNOSIS — I480 Paroxysmal atrial fibrillation: Secondary | ICD-10-CM | POA: Diagnosis not present

## 2019-10-15 DIAGNOSIS — I110 Hypertensive heart disease with heart failure: Secondary | ICD-10-CM | POA: Diagnosis not present

## 2019-10-15 DIAGNOSIS — E785 Hyperlipidemia, unspecified: Secondary | ICD-10-CM | POA: Diagnosis not present

## 2019-10-15 DIAGNOSIS — D649 Anemia, unspecified: Secondary | ICD-10-CM | POA: Diagnosis not present

## 2019-10-16 DIAGNOSIS — I5033 Acute on chronic diastolic (congestive) heart failure: Secondary | ICD-10-CM | POA: Diagnosis not present

## 2019-10-16 DIAGNOSIS — I25118 Atherosclerotic heart disease of native coronary artery with other forms of angina pectoris: Secondary | ICD-10-CM | POA: Diagnosis not present

## 2019-10-16 DIAGNOSIS — E785 Hyperlipidemia, unspecified: Secondary | ICD-10-CM | POA: Diagnosis not present

## 2019-10-16 DIAGNOSIS — D649 Anemia, unspecified: Secondary | ICD-10-CM | POA: Diagnosis not present

## 2019-10-16 DIAGNOSIS — I480 Paroxysmal atrial fibrillation: Secondary | ICD-10-CM | POA: Diagnosis not present

## 2019-10-16 DIAGNOSIS — I110 Hypertensive heart disease with heart failure: Secondary | ICD-10-CM | POA: Diagnosis not present

## 2019-10-17 DIAGNOSIS — I480 Paroxysmal atrial fibrillation: Secondary | ICD-10-CM | POA: Diagnosis not present

## 2019-10-17 DIAGNOSIS — I110 Hypertensive heart disease with heart failure: Secondary | ICD-10-CM | POA: Diagnosis not present

## 2019-10-17 DIAGNOSIS — E785 Hyperlipidemia, unspecified: Secondary | ICD-10-CM | POA: Diagnosis not present

## 2019-10-17 DIAGNOSIS — D649 Anemia, unspecified: Secondary | ICD-10-CM | POA: Diagnosis not present

## 2019-10-17 DIAGNOSIS — I25118 Atherosclerotic heart disease of native coronary artery with other forms of angina pectoris: Secondary | ICD-10-CM | POA: Diagnosis not present

## 2019-10-17 DIAGNOSIS — I5033 Acute on chronic diastolic (congestive) heart failure: Secondary | ICD-10-CM | POA: Diagnosis not present

## 2019-10-18 DIAGNOSIS — I25118 Atherosclerotic heart disease of native coronary artery with other forms of angina pectoris: Secondary | ICD-10-CM | POA: Diagnosis not present

## 2019-10-18 DIAGNOSIS — E785 Hyperlipidemia, unspecified: Secondary | ICD-10-CM | POA: Diagnosis not present

## 2019-10-18 DIAGNOSIS — I110 Hypertensive heart disease with heart failure: Secondary | ICD-10-CM | POA: Diagnosis not present

## 2019-10-18 DIAGNOSIS — I5033 Acute on chronic diastolic (congestive) heart failure: Secondary | ICD-10-CM | POA: Diagnosis not present

## 2019-10-18 DIAGNOSIS — I480 Paroxysmal atrial fibrillation: Secondary | ICD-10-CM | POA: Diagnosis not present

## 2019-10-18 DIAGNOSIS — D649 Anemia, unspecified: Secondary | ICD-10-CM | POA: Diagnosis not present

## 2019-10-19 DIAGNOSIS — D649 Anemia, unspecified: Secondary | ICD-10-CM | POA: Diagnosis not present

## 2019-10-19 DIAGNOSIS — I480 Paroxysmal atrial fibrillation: Secondary | ICD-10-CM | POA: Diagnosis not present

## 2019-10-19 DIAGNOSIS — I5033 Acute on chronic diastolic (congestive) heart failure: Secondary | ICD-10-CM | POA: Diagnosis not present

## 2019-10-19 DIAGNOSIS — E785 Hyperlipidemia, unspecified: Secondary | ICD-10-CM | POA: Diagnosis not present

## 2019-10-19 DIAGNOSIS — I110 Hypertensive heart disease with heart failure: Secondary | ICD-10-CM | POA: Diagnosis not present

## 2019-10-19 DIAGNOSIS — I25118 Atherosclerotic heart disease of native coronary artery with other forms of angina pectoris: Secondary | ICD-10-CM | POA: Diagnosis not present

## 2019-10-22 ENCOUNTER — Other Ambulatory Visit: Payer: Self-pay | Admitting: Family Medicine

## 2019-10-22 DIAGNOSIS — D649 Anemia, unspecified: Secondary | ICD-10-CM | POA: Diagnosis not present

## 2019-10-22 DIAGNOSIS — I5033 Acute on chronic diastolic (congestive) heart failure: Secondary | ICD-10-CM | POA: Diagnosis not present

## 2019-10-22 DIAGNOSIS — I25118 Atherosclerotic heart disease of native coronary artery with other forms of angina pectoris: Secondary | ICD-10-CM | POA: Diagnosis not present

## 2019-10-22 DIAGNOSIS — I110 Hypertensive heart disease with heart failure: Secondary | ICD-10-CM | POA: Diagnosis not present

## 2019-10-22 DIAGNOSIS — E785 Hyperlipidemia, unspecified: Secondary | ICD-10-CM | POA: Diagnosis not present

## 2019-10-22 DIAGNOSIS — I480 Paroxysmal atrial fibrillation: Secondary | ICD-10-CM | POA: Diagnosis not present

## 2019-10-22 NOTE — Telephone Encounter (Signed)
Last filled on 07/27/2019 #30 with 2 refills.  LOV 02/19/2018 for acute visit with Dr Glori Bickers

## 2019-10-25 NOTE — Telephone Encounter (Signed)
Sent. Thanks.   

## 2019-10-26 DIAGNOSIS — I5033 Acute on chronic diastolic (congestive) heart failure: Secondary | ICD-10-CM | POA: Diagnosis not present

## 2019-10-26 DIAGNOSIS — E785 Hyperlipidemia, unspecified: Secondary | ICD-10-CM | POA: Diagnosis not present

## 2019-10-26 DIAGNOSIS — I25118 Atherosclerotic heart disease of native coronary artery with other forms of angina pectoris: Secondary | ICD-10-CM | POA: Diagnosis not present

## 2019-10-26 DIAGNOSIS — D649 Anemia, unspecified: Secondary | ICD-10-CM | POA: Diagnosis not present

## 2019-10-26 DIAGNOSIS — I480 Paroxysmal atrial fibrillation: Secondary | ICD-10-CM | POA: Diagnosis not present

## 2019-10-26 DIAGNOSIS — I110 Hypertensive heart disease with heart failure: Secondary | ICD-10-CM | POA: Diagnosis not present

## 2019-11-02 DIAGNOSIS — E785 Hyperlipidemia, unspecified: Secondary | ICD-10-CM | POA: Diagnosis not present

## 2019-11-02 DIAGNOSIS — I25118 Atherosclerotic heart disease of native coronary artery with other forms of angina pectoris: Secondary | ICD-10-CM | POA: Diagnosis not present

## 2019-11-02 DIAGNOSIS — I5033 Acute on chronic diastolic (congestive) heart failure: Secondary | ICD-10-CM | POA: Diagnosis not present

## 2019-11-02 DIAGNOSIS — I110 Hypertensive heart disease with heart failure: Secondary | ICD-10-CM | POA: Diagnosis not present

## 2019-11-02 DIAGNOSIS — I480 Paroxysmal atrial fibrillation: Secondary | ICD-10-CM | POA: Diagnosis not present

## 2019-11-02 DIAGNOSIS — D649 Anemia, unspecified: Secondary | ICD-10-CM | POA: Diagnosis not present

## 2019-11-08 DIAGNOSIS — Z951 Presence of aortocoronary bypass graft: Secondary | ICD-10-CM | POA: Diagnosis not present

## 2019-11-08 DIAGNOSIS — R32 Unspecified urinary incontinence: Secondary | ICD-10-CM | POA: Diagnosis not present

## 2019-11-08 DIAGNOSIS — Z9981 Dependence on supplemental oxygen: Secondary | ICD-10-CM | POA: Diagnosis not present

## 2019-11-08 DIAGNOSIS — I69891 Dysphagia following other cerebrovascular disease: Secondary | ICD-10-CM | POA: Diagnosis not present

## 2019-11-08 DIAGNOSIS — I5033 Acute on chronic diastolic (congestive) heart failure: Secondary | ICD-10-CM | POA: Diagnosis not present

## 2019-11-08 DIAGNOSIS — D649 Anemia, unspecified: Secondary | ICD-10-CM | POA: Diagnosis not present

## 2019-11-08 DIAGNOSIS — I25118 Atherosclerotic heart disease of native coronary artery with other forms of angina pectoris: Secondary | ICD-10-CM | POA: Diagnosis not present

## 2019-11-08 DIAGNOSIS — R319 Hematuria, unspecified: Secondary | ICD-10-CM | POA: Diagnosis not present

## 2019-11-08 DIAGNOSIS — K219 Gastro-esophageal reflux disease without esophagitis: Secondary | ICD-10-CM | POA: Diagnosis not present

## 2019-11-08 DIAGNOSIS — Z87440 Personal history of urinary (tract) infections: Secondary | ICD-10-CM | POA: Diagnosis not present

## 2019-11-08 DIAGNOSIS — E039 Hypothyroidism, unspecified: Secondary | ICD-10-CM | POA: Diagnosis not present

## 2019-11-08 DIAGNOSIS — Z681 Body mass index (BMI) 19 or less, adult: Secondary | ICD-10-CM | POA: Diagnosis not present

## 2019-11-08 DIAGNOSIS — I480 Paroxysmal atrial fibrillation: Secondary | ICD-10-CM | POA: Diagnosis not present

## 2019-11-08 DIAGNOSIS — I110 Hypertensive heart disease with heart failure: Secondary | ICD-10-CM | POA: Diagnosis not present

## 2019-11-08 DIAGNOSIS — R441 Visual hallucinations: Secondary | ICD-10-CM | POA: Diagnosis not present

## 2019-11-08 DIAGNOSIS — I69822 Dysarthria following other cerebrovascular disease: Secondary | ICD-10-CM | POA: Diagnosis not present

## 2019-11-08 DIAGNOSIS — F0150 Vascular dementia without behavioral disturbance: Secondary | ICD-10-CM | POA: Diagnosis not present

## 2019-11-08 DIAGNOSIS — R627 Adult failure to thrive: Secondary | ICD-10-CM | POA: Diagnosis not present

## 2019-11-08 DIAGNOSIS — E785 Hyperlipidemia, unspecified: Secondary | ICD-10-CM | POA: Diagnosis not present

## 2019-11-08 DIAGNOSIS — I69818 Other symptoms and signs involving cognitive functions following other cerebrovascular disease: Secondary | ICD-10-CM | POA: Diagnosis not present

## 2019-11-08 DIAGNOSIS — R159 Full incontinence of feces: Secondary | ICD-10-CM | POA: Diagnosis not present

## 2019-11-08 DIAGNOSIS — D485 Neoplasm of uncertain behavior of skin: Secondary | ICD-10-CM | POA: Diagnosis not present

## 2019-11-08 DIAGNOSIS — Z466 Encounter for fitting and adjustment of urinary device: Secondary | ICD-10-CM | POA: Diagnosis not present

## 2019-11-10 DIAGNOSIS — I110 Hypertensive heart disease with heart failure: Secondary | ICD-10-CM | POA: Diagnosis not present

## 2019-11-10 DIAGNOSIS — I25118 Atherosclerotic heart disease of native coronary artery with other forms of angina pectoris: Secondary | ICD-10-CM | POA: Diagnosis not present

## 2019-11-10 DIAGNOSIS — E785 Hyperlipidemia, unspecified: Secondary | ICD-10-CM | POA: Diagnosis not present

## 2019-11-10 DIAGNOSIS — D649 Anemia, unspecified: Secondary | ICD-10-CM | POA: Diagnosis not present

## 2019-11-10 DIAGNOSIS — I5033 Acute on chronic diastolic (congestive) heart failure: Secondary | ICD-10-CM | POA: Diagnosis not present

## 2019-11-10 DIAGNOSIS — I480 Paroxysmal atrial fibrillation: Secondary | ICD-10-CM | POA: Diagnosis not present

## 2019-11-12 DIAGNOSIS — I110 Hypertensive heart disease with heart failure: Secondary | ICD-10-CM | POA: Diagnosis not present

## 2019-11-12 DIAGNOSIS — I5033 Acute on chronic diastolic (congestive) heart failure: Secondary | ICD-10-CM | POA: Diagnosis not present

## 2019-11-12 DIAGNOSIS — I25118 Atherosclerotic heart disease of native coronary artery with other forms of angina pectoris: Secondary | ICD-10-CM | POA: Diagnosis not present

## 2019-11-12 DIAGNOSIS — I480 Paroxysmal atrial fibrillation: Secondary | ICD-10-CM | POA: Diagnosis not present

## 2019-11-12 DIAGNOSIS — D649 Anemia, unspecified: Secondary | ICD-10-CM | POA: Diagnosis not present

## 2019-11-12 DIAGNOSIS — E785 Hyperlipidemia, unspecified: Secondary | ICD-10-CM | POA: Diagnosis not present

## 2019-11-17 DIAGNOSIS — I25118 Atherosclerotic heart disease of native coronary artery with other forms of angina pectoris: Secondary | ICD-10-CM | POA: Diagnosis not present

## 2019-11-17 DIAGNOSIS — D649 Anemia, unspecified: Secondary | ICD-10-CM | POA: Diagnosis not present

## 2019-11-17 DIAGNOSIS — I5033 Acute on chronic diastolic (congestive) heart failure: Secondary | ICD-10-CM | POA: Diagnosis not present

## 2019-11-17 DIAGNOSIS — I110 Hypertensive heart disease with heart failure: Secondary | ICD-10-CM | POA: Diagnosis not present

## 2019-11-17 DIAGNOSIS — E785 Hyperlipidemia, unspecified: Secondary | ICD-10-CM | POA: Diagnosis not present

## 2019-11-17 DIAGNOSIS — I480 Paroxysmal atrial fibrillation: Secondary | ICD-10-CM | POA: Diagnosis not present

## 2019-11-24 DIAGNOSIS — E785 Hyperlipidemia, unspecified: Secondary | ICD-10-CM | POA: Diagnosis not present

## 2019-11-24 DIAGNOSIS — D649 Anemia, unspecified: Secondary | ICD-10-CM | POA: Diagnosis not present

## 2019-11-24 DIAGNOSIS — I5033 Acute on chronic diastolic (congestive) heart failure: Secondary | ICD-10-CM | POA: Diagnosis not present

## 2019-11-24 DIAGNOSIS — I480 Paroxysmal atrial fibrillation: Secondary | ICD-10-CM | POA: Diagnosis not present

## 2019-11-24 DIAGNOSIS — I25118 Atherosclerotic heart disease of native coronary artery with other forms of angina pectoris: Secondary | ICD-10-CM | POA: Diagnosis not present

## 2019-11-24 DIAGNOSIS — I110 Hypertensive heart disease with heart failure: Secondary | ICD-10-CM | POA: Diagnosis not present

## 2019-11-26 DIAGNOSIS — I110 Hypertensive heart disease with heart failure: Secondary | ICD-10-CM | POA: Diagnosis not present

## 2019-11-26 DIAGNOSIS — E785 Hyperlipidemia, unspecified: Secondary | ICD-10-CM | POA: Diagnosis not present

## 2019-11-26 DIAGNOSIS — D649 Anemia, unspecified: Secondary | ICD-10-CM | POA: Diagnosis not present

## 2019-11-26 DIAGNOSIS — I25118 Atherosclerotic heart disease of native coronary artery with other forms of angina pectoris: Secondary | ICD-10-CM | POA: Diagnosis not present

## 2019-11-26 DIAGNOSIS — I5033 Acute on chronic diastolic (congestive) heart failure: Secondary | ICD-10-CM | POA: Diagnosis not present

## 2019-11-26 DIAGNOSIS — I480 Paroxysmal atrial fibrillation: Secondary | ICD-10-CM | POA: Diagnosis not present

## 2019-12-02 DIAGNOSIS — D649 Anemia, unspecified: Secondary | ICD-10-CM | POA: Diagnosis not present

## 2019-12-02 DIAGNOSIS — I25118 Atherosclerotic heart disease of native coronary artery with other forms of angina pectoris: Secondary | ICD-10-CM | POA: Diagnosis not present

## 2019-12-02 DIAGNOSIS — I5033 Acute on chronic diastolic (congestive) heart failure: Secondary | ICD-10-CM | POA: Diagnosis not present

## 2019-12-02 DIAGNOSIS — I480 Paroxysmal atrial fibrillation: Secondary | ICD-10-CM | POA: Diagnosis not present

## 2019-12-02 DIAGNOSIS — E785 Hyperlipidemia, unspecified: Secondary | ICD-10-CM | POA: Diagnosis not present

## 2019-12-02 DIAGNOSIS — I110 Hypertensive heart disease with heart failure: Secondary | ICD-10-CM | POA: Diagnosis not present

## 2019-12-06 DIAGNOSIS — D649 Anemia, unspecified: Secondary | ICD-10-CM | POA: Diagnosis not present

## 2019-12-06 DIAGNOSIS — I110 Hypertensive heart disease with heart failure: Secondary | ICD-10-CM | POA: Diagnosis not present

## 2019-12-06 DIAGNOSIS — E785 Hyperlipidemia, unspecified: Secondary | ICD-10-CM | POA: Diagnosis not present

## 2019-12-06 DIAGNOSIS — I25118 Atherosclerotic heart disease of native coronary artery with other forms of angina pectoris: Secondary | ICD-10-CM | POA: Diagnosis not present

## 2019-12-06 DIAGNOSIS — I5033 Acute on chronic diastolic (congestive) heart failure: Secondary | ICD-10-CM | POA: Diagnosis not present

## 2019-12-06 DIAGNOSIS — I480 Paroxysmal atrial fibrillation: Secondary | ICD-10-CM | POA: Diagnosis not present

## 2019-12-09 DIAGNOSIS — E039 Hypothyroidism, unspecified: Secondary | ICD-10-CM | POA: Diagnosis not present

## 2019-12-09 DIAGNOSIS — E785 Hyperlipidemia, unspecified: Secondary | ICD-10-CM | POA: Diagnosis not present

## 2019-12-09 DIAGNOSIS — I69818 Other symptoms and signs involving cognitive functions following other cerebrovascular disease: Secondary | ICD-10-CM | POA: Diagnosis not present

## 2019-12-09 DIAGNOSIS — F0150 Vascular dementia without behavioral disturbance: Secondary | ICD-10-CM | POA: Diagnosis not present

## 2019-12-09 DIAGNOSIS — I5033 Acute on chronic diastolic (congestive) heart failure: Secondary | ICD-10-CM | POA: Diagnosis not present

## 2019-12-09 DIAGNOSIS — Z9981 Dependence on supplemental oxygen: Secondary | ICD-10-CM | POA: Diagnosis not present

## 2019-12-09 DIAGNOSIS — R319 Hematuria, unspecified: Secondary | ICD-10-CM | POA: Diagnosis not present

## 2019-12-09 DIAGNOSIS — I25118 Atherosclerotic heart disease of native coronary artery with other forms of angina pectoris: Secondary | ICD-10-CM | POA: Diagnosis not present

## 2019-12-09 DIAGNOSIS — I480 Paroxysmal atrial fibrillation: Secondary | ICD-10-CM | POA: Diagnosis not present

## 2019-12-09 DIAGNOSIS — R441 Visual hallucinations: Secondary | ICD-10-CM | POA: Diagnosis not present

## 2019-12-09 DIAGNOSIS — Z87440 Personal history of urinary (tract) infections: Secondary | ICD-10-CM | POA: Diagnosis not present

## 2019-12-09 DIAGNOSIS — D649 Anemia, unspecified: Secondary | ICD-10-CM | POA: Diagnosis not present

## 2019-12-09 DIAGNOSIS — Z681 Body mass index (BMI) 19 or less, adult: Secondary | ICD-10-CM | POA: Diagnosis not present

## 2019-12-09 DIAGNOSIS — R159 Full incontinence of feces: Secondary | ICD-10-CM | POA: Diagnosis not present

## 2019-12-09 DIAGNOSIS — I69891 Dysphagia following other cerebrovascular disease: Secondary | ICD-10-CM | POA: Diagnosis not present

## 2019-12-09 DIAGNOSIS — Z466 Encounter for fitting and adjustment of urinary device: Secondary | ICD-10-CM | POA: Diagnosis not present

## 2019-12-09 DIAGNOSIS — K219 Gastro-esophageal reflux disease without esophagitis: Secondary | ICD-10-CM | POA: Diagnosis not present

## 2019-12-09 DIAGNOSIS — I110 Hypertensive heart disease with heart failure: Secondary | ICD-10-CM | POA: Diagnosis not present

## 2019-12-09 DIAGNOSIS — Z951 Presence of aortocoronary bypass graft: Secondary | ICD-10-CM | POA: Diagnosis not present

## 2019-12-09 DIAGNOSIS — L89152 Pressure ulcer of sacral region, stage 2: Secondary | ICD-10-CM | POA: Diagnosis not present

## 2019-12-09 DIAGNOSIS — I69822 Dysarthria following other cerebrovascular disease: Secondary | ICD-10-CM | POA: Diagnosis not present

## 2019-12-09 DIAGNOSIS — R32 Unspecified urinary incontinence: Secondary | ICD-10-CM | POA: Diagnosis not present

## 2019-12-09 DIAGNOSIS — R627 Adult failure to thrive: Secondary | ICD-10-CM | POA: Diagnosis not present

## 2019-12-09 DIAGNOSIS — D485 Neoplasm of uncertain behavior of skin: Secondary | ICD-10-CM | POA: Diagnosis not present

## 2019-12-14 DIAGNOSIS — I5033 Acute on chronic diastolic (congestive) heart failure: Secondary | ICD-10-CM | POA: Diagnosis not present

## 2019-12-14 DIAGNOSIS — I480 Paroxysmal atrial fibrillation: Secondary | ICD-10-CM | POA: Diagnosis not present

## 2019-12-14 DIAGNOSIS — I110 Hypertensive heart disease with heart failure: Secondary | ICD-10-CM | POA: Diagnosis not present

## 2019-12-14 DIAGNOSIS — E785 Hyperlipidemia, unspecified: Secondary | ICD-10-CM | POA: Diagnosis not present

## 2019-12-14 DIAGNOSIS — D649 Anemia, unspecified: Secondary | ICD-10-CM | POA: Diagnosis not present

## 2019-12-14 DIAGNOSIS — I25118 Atherosclerotic heart disease of native coronary artery with other forms of angina pectoris: Secondary | ICD-10-CM | POA: Diagnosis not present

## 2019-12-23 DIAGNOSIS — D649 Anemia, unspecified: Secondary | ICD-10-CM | POA: Diagnosis not present

## 2019-12-23 DIAGNOSIS — I480 Paroxysmal atrial fibrillation: Secondary | ICD-10-CM | POA: Diagnosis not present

## 2019-12-23 DIAGNOSIS — I110 Hypertensive heart disease with heart failure: Secondary | ICD-10-CM | POA: Diagnosis not present

## 2019-12-23 DIAGNOSIS — I25118 Atherosclerotic heart disease of native coronary artery with other forms of angina pectoris: Secondary | ICD-10-CM | POA: Diagnosis not present

## 2019-12-23 DIAGNOSIS — I5033 Acute on chronic diastolic (congestive) heart failure: Secondary | ICD-10-CM | POA: Diagnosis not present

## 2019-12-23 DIAGNOSIS — E785 Hyperlipidemia, unspecified: Secondary | ICD-10-CM | POA: Diagnosis not present

## 2019-12-24 DIAGNOSIS — D649 Anemia, unspecified: Secondary | ICD-10-CM | POA: Diagnosis not present

## 2019-12-24 DIAGNOSIS — I480 Paroxysmal atrial fibrillation: Secondary | ICD-10-CM | POA: Diagnosis not present

## 2019-12-24 DIAGNOSIS — I25118 Atherosclerotic heart disease of native coronary artery with other forms of angina pectoris: Secondary | ICD-10-CM | POA: Diagnosis not present

## 2019-12-24 DIAGNOSIS — I5033 Acute on chronic diastolic (congestive) heart failure: Secondary | ICD-10-CM | POA: Diagnosis not present

## 2019-12-24 DIAGNOSIS — E785 Hyperlipidemia, unspecified: Secondary | ICD-10-CM | POA: Diagnosis not present

## 2019-12-24 DIAGNOSIS — I110 Hypertensive heart disease with heart failure: Secondary | ICD-10-CM | POA: Diagnosis not present

## 2019-12-30 DIAGNOSIS — I110 Hypertensive heart disease with heart failure: Secondary | ICD-10-CM | POA: Diagnosis not present

## 2019-12-30 DIAGNOSIS — E785 Hyperlipidemia, unspecified: Secondary | ICD-10-CM | POA: Diagnosis not present

## 2019-12-30 DIAGNOSIS — I480 Paroxysmal atrial fibrillation: Secondary | ICD-10-CM | POA: Diagnosis not present

## 2019-12-30 DIAGNOSIS — I25118 Atherosclerotic heart disease of native coronary artery with other forms of angina pectoris: Secondary | ICD-10-CM | POA: Diagnosis not present

## 2019-12-30 DIAGNOSIS — I5033 Acute on chronic diastolic (congestive) heart failure: Secondary | ICD-10-CM | POA: Diagnosis not present

## 2019-12-30 DIAGNOSIS — D649 Anemia, unspecified: Secondary | ICD-10-CM | POA: Diagnosis not present

## 2020-01-06 DIAGNOSIS — I5033 Acute on chronic diastolic (congestive) heart failure: Secondary | ICD-10-CM | POA: Diagnosis not present

## 2020-01-06 DIAGNOSIS — E785 Hyperlipidemia, unspecified: Secondary | ICD-10-CM | POA: Diagnosis not present

## 2020-01-06 DIAGNOSIS — D649 Anemia, unspecified: Secondary | ICD-10-CM | POA: Diagnosis not present

## 2020-01-06 DIAGNOSIS — I110 Hypertensive heart disease with heart failure: Secondary | ICD-10-CM | POA: Diagnosis not present

## 2020-01-06 DIAGNOSIS — I25118 Atherosclerotic heart disease of native coronary artery with other forms of angina pectoris: Secondary | ICD-10-CM | POA: Diagnosis not present

## 2020-01-06 DIAGNOSIS — I480 Paroxysmal atrial fibrillation: Secondary | ICD-10-CM | POA: Diagnosis not present

## 2020-01-08 DIAGNOSIS — Z951 Presence of aortocoronary bypass graft: Secondary | ICD-10-CM | POA: Diagnosis not present

## 2020-01-08 DIAGNOSIS — Z87440 Personal history of urinary (tract) infections: Secondary | ICD-10-CM | POA: Diagnosis not present

## 2020-01-08 DIAGNOSIS — I5033 Acute on chronic diastolic (congestive) heart failure: Secondary | ICD-10-CM | POA: Diagnosis not present

## 2020-01-08 DIAGNOSIS — R627 Adult failure to thrive: Secondary | ICD-10-CM | POA: Diagnosis not present

## 2020-01-08 DIAGNOSIS — Z681 Body mass index (BMI) 19 or less, adult: Secondary | ICD-10-CM | POA: Diagnosis not present

## 2020-01-08 DIAGNOSIS — R441 Visual hallucinations: Secondary | ICD-10-CM | POA: Diagnosis not present

## 2020-01-08 DIAGNOSIS — R159 Full incontinence of feces: Secondary | ICD-10-CM | POA: Diagnosis not present

## 2020-01-08 DIAGNOSIS — K219 Gastro-esophageal reflux disease without esophagitis: Secondary | ICD-10-CM | POA: Diagnosis not present

## 2020-01-08 DIAGNOSIS — E039 Hypothyroidism, unspecified: Secondary | ICD-10-CM | POA: Diagnosis not present

## 2020-01-08 DIAGNOSIS — L89152 Pressure ulcer of sacral region, stage 2: Secondary | ICD-10-CM | POA: Diagnosis not present

## 2020-01-08 DIAGNOSIS — I25118 Atherosclerotic heart disease of native coronary artery with other forms of angina pectoris: Secondary | ICD-10-CM | POA: Diagnosis not present

## 2020-01-08 DIAGNOSIS — D485 Neoplasm of uncertain behavior of skin: Secondary | ICD-10-CM | POA: Diagnosis not present

## 2020-01-08 DIAGNOSIS — R32 Unspecified urinary incontinence: Secondary | ICD-10-CM | POA: Diagnosis not present

## 2020-01-08 DIAGNOSIS — I480 Paroxysmal atrial fibrillation: Secondary | ICD-10-CM | POA: Diagnosis not present

## 2020-01-08 DIAGNOSIS — E785 Hyperlipidemia, unspecified: Secondary | ICD-10-CM | POA: Diagnosis not present

## 2020-01-08 DIAGNOSIS — I69891 Dysphagia following other cerebrovascular disease: Secondary | ICD-10-CM | POA: Diagnosis not present

## 2020-01-08 DIAGNOSIS — I69822 Dysarthria following other cerebrovascular disease: Secondary | ICD-10-CM | POA: Diagnosis not present

## 2020-01-08 DIAGNOSIS — R319 Hematuria, unspecified: Secondary | ICD-10-CM | POA: Diagnosis not present

## 2020-01-08 DIAGNOSIS — F0150 Vascular dementia without behavioral disturbance: Secondary | ICD-10-CM | POA: Diagnosis not present

## 2020-01-08 DIAGNOSIS — I69818 Other symptoms and signs involving cognitive functions following other cerebrovascular disease: Secondary | ICD-10-CM | POA: Diagnosis not present

## 2020-01-08 DIAGNOSIS — D649 Anemia, unspecified: Secondary | ICD-10-CM | POA: Diagnosis not present

## 2020-01-08 DIAGNOSIS — Z9981 Dependence on supplemental oxygen: Secondary | ICD-10-CM | POA: Diagnosis not present

## 2020-01-08 DIAGNOSIS — Z466 Encounter for fitting and adjustment of urinary device: Secondary | ICD-10-CM | POA: Diagnosis not present

## 2020-01-08 DIAGNOSIS — I110 Hypertensive heart disease with heart failure: Secondary | ICD-10-CM | POA: Diagnosis not present

## 2020-01-10 DIAGNOSIS — I480 Paroxysmal atrial fibrillation: Secondary | ICD-10-CM | POA: Diagnosis not present

## 2020-01-10 DIAGNOSIS — E785 Hyperlipidemia, unspecified: Secondary | ICD-10-CM | POA: Diagnosis not present

## 2020-01-10 DIAGNOSIS — I110 Hypertensive heart disease with heart failure: Secondary | ICD-10-CM | POA: Diagnosis not present

## 2020-01-10 DIAGNOSIS — I5033 Acute on chronic diastolic (congestive) heart failure: Secondary | ICD-10-CM | POA: Diagnosis not present

## 2020-01-10 DIAGNOSIS — I25118 Atherosclerotic heart disease of native coronary artery with other forms of angina pectoris: Secondary | ICD-10-CM | POA: Diagnosis not present

## 2020-01-10 DIAGNOSIS — D649 Anemia, unspecified: Secondary | ICD-10-CM | POA: Diagnosis not present

## 2020-01-11 DIAGNOSIS — I110 Hypertensive heart disease with heart failure: Secondary | ICD-10-CM | POA: Diagnosis not present

## 2020-01-11 DIAGNOSIS — I5033 Acute on chronic diastolic (congestive) heart failure: Secondary | ICD-10-CM | POA: Diagnosis not present

## 2020-01-11 DIAGNOSIS — E785 Hyperlipidemia, unspecified: Secondary | ICD-10-CM | POA: Diagnosis not present

## 2020-01-11 DIAGNOSIS — I480 Paroxysmal atrial fibrillation: Secondary | ICD-10-CM | POA: Diagnosis not present

## 2020-01-11 DIAGNOSIS — D649 Anemia, unspecified: Secondary | ICD-10-CM | POA: Diagnosis not present

## 2020-01-11 DIAGNOSIS — I25118 Atherosclerotic heart disease of native coronary artery with other forms of angina pectoris: Secondary | ICD-10-CM | POA: Diagnosis not present

## 2020-01-12 DIAGNOSIS — I5033 Acute on chronic diastolic (congestive) heart failure: Secondary | ICD-10-CM | POA: Diagnosis not present

## 2020-01-12 DIAGNOSIS — I480 Paroxysmal atrial fibrillation: Secondary | ICD-10-CM | POA: Diagnosis not present

## 2020-01-12 DIAGNOSIS — E785 Hyperlipidemia, unspecified: Secondary | ICD-10-CM | POA: Diagnosis not present

## 2020-01-12 DIAGNOSIS — I110 Hypertensive heart disease with heart failure: Secondary | ICD-10-CM | POA: Diagnosis not present

## 2020-01-12 DIAGNOSIS — D649 Anemia, unspecified: Secondary | ICD-10-CM | POA: Diagnosis not present

## 2020-01-12 DIAGNOSIS — I25118 Atherosclerotic heart disease of native coronary artery with other forms of angina pectoris: Secondary | ICD-10-CM | POA: Diagnosis not present

## 2020-01-13 DIAGNOSIS — I25118 Atherosclerotic heart disease of native coronary artery with other forms of angina pectoris: Secondary | ICD-10-CM | POA: Diagnosis not present

## 2020-01-13 DIAGNOSIS — I480 Paroxysmal atrial fibrillation: Secondary | ICD-10-CM | POA: Diagnosis not present

## 2020-01-13 DIAGNOSIS — I110 Hypertensive heart disease with heart failure: Secondary | ICD-10-CM | POA: Diagnosis not present

## 2020-01-13 DIAGNOSIS — I5033 Acute on chronic diastolic (congestive) heart failure: Secondary | ICD-10-CM | POA: Diagnosis not present

## 2020-01-13 DIAGNOSIS — D649 Anemia, unspecified: Secondary | ICD-10-CM | POA: Diagnosis not present

## 2020-01-13 DIAGNOSIS — E785 Hyperlipidemia, unspecified: Secondary | ICD-10-CM | POA: Diagnosis not present

## 2020-01-14 DIAGNOSIS — I5033 Acute on chronic diastolic (congestive) heart failure: Secondary | ICD-10-CM | POA: Diagnosis not present

## 2020-01-14 DIAGNOSIS — I480 Paroxysmal atrial fibrillation: Secondary | ICD-10-CM | POA: Diagnosis not present

## 2020-01-14 DIAGNOSIS — I25118 Atherosclerotic heart disease of native coronary artery with other forms of angina pectoris: Secondary | ICD-10-CM | POA: Diagnosis not present

## 2020-01-14 DIAGNOSIS — E785 Hyperlipidemia, unspecified: Secondary | ICD-10-CM | POA: Diagnosis not present

## 2020-01-14 DIAGNOSIS — L600 Ingrowing nail: Secondary | ICD-10-CM | POA: Diagnosis not present

## 2020-01-14 DIAGNOSIS — M79675 Pain in left toe(s): Secondary | ICD-10-CM | POA: Diagnosis not present

## 2020-01-14 DIAGNOSIS — E119 Type 2 diabetes mellitus without complications: Secondary | ICD-10-CM | POA: Diagnosis not present

## 2020-01-14 DIAGNOSIS — D649 Anemia, unspecified: Secondary | ICD-10-CM | POA: Diagnosis not present

## 2020-01-14 DIAGNOSIS — I110 Hypertensive heart disease with heart failure: Secondary | ICD-10-CM | POA: Diagnosis not present

## 2020-01-14 DIAGNOSIS — M79674 Pain in right toe(s): Secondary | ICD-10-CM | POA: Diagnosis not present

## 2020-01-14 DIAGNOSIS — B351 Tinea unguium: Secondary | ICD-10-CM | POA: Diagnosis not present

## 2020-01-15 DIAGNOSIS — D649 Anemia, unspecified: Secondary | ICD-10-CM | POA: Diagnosis not present

## 2020-01-15 DIAGNOSIS — I480 Paroxysmal atrial fibrillation: Secondary | ICD-10-CM | POA: Diagnosis not present

## 2020-01-15 DIAGNOSIS — E785 Hyperlipidemia, unspecified: Secondary | ICD-10-CM | POA: Diagnosis not present

## 2020-01-15 DIAGNOSIS — I25118 Atherosclerotic heart disease of native coronary artery with other forms of angina pectoris: Secondary | ICD-10-CM | POA: Diagnosis not present

## 2020-01-15 DIAGNOSIS — I5033 Acute on chronic diastolic (congestive) heart failure: Secondary | ICD-10-CM | POA: Diagnosis not present

## 2020-01-15 DIAGNOSIS — I110 Hypertensive heart disease with heart failure: Secondary | ICD-10-CM | POA: Diagnosis not present

## 2020-01-16 DIAGNOSIS — I110 Hypertensive heart disease with heart failure: Secondary | ICD-10-CM | POA: Diagnosis not present

## 2020-01-16 DIAGNOSIS — D649 Anemia, unspecified: Secondary | ICD-10-CM | POA: Diagnosis not present

## 2020-01-16 DIAGNOSIS — I25118 Atherosclerotic heart disease of native coronary artery with other forms of angina pectoris: Secondary | ICD-10-CM | POA: Diagnosis not present

## 2020-01-16 DIAGNOSIS — E785 Hyperlipidemia, unspecified: Secondary | ICD-10-CM | POA: Diagnosis not present

## 2020-01-16 DIAGNOSIS — I5033 Acute on chronic diastolic (congestive) heart failure: Secondary | ICD-10-CM | POA: Diagnosis not present

## 2020-01-16 DIAGNOSIS — I480 Paroxysmal atrial fibrillation: Secondary | ICD-10-CM | POA: Diagnosis not present

## 2020-01-17 DIAGNOSIS — I480 Paroxysmal atrial fibrillation: Secondary | ICD-10-CM | POA: Diagnosis not present

## 2020-01-17 DIAGNOSIS — I5033 Acute on chronic diastolic (congestive) heart failure: Secondary | ICD-10-CM | POA: Diagnosis not present

## 2020-01-17 DIAGNOSIS — D649 Anemia, unspecified: Secondary | ICD-10-CM | POA: Diagnosis not present

## 2020-01-17 DIAGNOSIS — I25118 Atherosclerotic heart disease of native coronary artery with other forms of angina pectoris: Secondary | ICD-10-CM | POA: Diagnosis not present

## 2020-01-17 DIAGNOSIS — I110 Hypertensive heart disease with heart failure: Secondary | ICD-10-CM | POA: Diagnosis not present

## 2020-01-17 DIAGNOSIS — E785 Hyperlipidemia, unspecified: Secondary | ICD-10-CM | POA: Diagnosis not present

## 2020-01-18 DIAGNOSIS — E785 Hyperlipidemia, unspecified: Secondary | ICD-10-CM | POA: Diagnosis not present

## 2020-01-18 DIAGNOSIS — D649 Anemia, unspecified: Secondary | ICD-10-CM | POA: Diagnosis not present

## 2020-01-18 DIAGNOSIS — I25118 Atherosclerotic heart disease of native coronary artery with other forms of angina pectoris: Secondary | ICD-10-CM | POA: Diagnosis not present

## 2020-01-18 DIAGNOSIS — I480 Paroxysmal atrial fibrillation: Secondary | ICD-10-CM | POA: Diagnosis not present

## 2020-01-18 DIAGNOSIS — I5033 Acute on chronic diastolic (congestive) heart failure: Secondary | ICD-10-CM | POA: Diagnosis not present

## 2020-01-18 DIAGNOSIS — I110 Hypertensive heart disease with heart failure: Secondary | ICD-10-CM | POA: Diagnosis not present

## 2020-01-21 DIAGNOSIS — D649 Anemia, unspecified: Secondary | ICD-10-CM | POA: Diagnosis not present

## 2020-01-21 DIAGNOSIS — I110 Hypertensive heart disease with heart failure: Secondary | ICD-10-CM | POA: Diagnosis not present

## 2020-01-21 DIAGNOSIS — I25118 Atherosclerotic heart disease of native coronary artery with other forms of angina pectoris: Secondary | ICD-10-CM | POA: Diagnosis not present

## 2020-01-21 DIAGNOSIS — I5033 Acute on chronic diastolic (congestive) heart failure: Secondary | ICD-10-CM | POA: Diagnosis not present

## 2020-01-21 DIAGNOSIS — E785 Hyperlipidemia, unspecified: Secondary | ICD-10-CM | POA: Diagnosis not present

## 2020-01-21 DIAGNOSIS — I480 Paroxysmal atrial fibrillation: Secondary | ICD-10-CM | POA: Diagnosis not present

## 2020-01-24 ENCOUNTER — Other Ambulatory Visit: Payer: Self-pay | Admitting: Family Medicine

## 2020-01-24 DIAGNOSIS — I25118 Atherosclerotic heart disease of native coronary artery with other forms of angina pectoris: Secondary | ICD-10-CM | POA: Diagnosis not present

## 2020-01-24 DIAGNOSIS — I5033 Acute on chronic diastolic (congestive) heart failure: Secondary | ICD-10-CM | POA: Diagnosis not present

## 2020-01-24 DIAGNOSIS — E785 Hyperlipidemia, unspecified: Secondary | ICD-10-CM | POA: Diagnosis not present

## 2020-01-24 DIAGNOSIS — I480 Paroxysmal atrial fibrillation: Secondary | ICD-10-CM | POA: Diagnosis not present

## 2020-01-24 DIAGNOSIS — D649 Anemia, unspecified: Secondary | ICD-10-CM | POA: Diagnosis not present

## 2020-01-24 DIAGNOSIS — I110 Hypertensive heart disease with heart failure: Secondary | ICD-10-CM | POA: Diagnosis not present

## 2020-01-25 NOTE — Telephone Encounter (Signed)
Electronic refill request. Quetiapine Last office visit:   02/19/2018 Last Filled:    30 tablet 2 10/25/2019  Please advise.

## 2020-01-26 DIAGNOSIS — E785 Hyperlipidemia, unspecified: Secondary | ICD-10-CM | POA: Diagnosis not present

## 2020-01-26 DIAGNOSIS — D649 Anemia, unspecified: Secondary | ICD-10-CM | POA: Diagnosis not present

## 2020-01-26 DIAGNOSIS — I25118 Atherosclerotic heart disease of native coronary artery with other forms of angina pectoris: Secondary | ICD-10-CM | POA: Diagnosis not present

## 2020-01-26 DIAGNOSIS — I5033 Acute on chronic diastolic (congestive) heart failure: Secondary | ICD-10-CM | POA: Diagnosis not present

## 2020-01-26 DIAGNOSIS — I110 Hypertensive heart disease with heart failure: Secondary | ICD-10-CM | POA: Diagnosis not present

## 2020-01-26 DIAGNOSIS — I480 Paroxysmal atrial fibrillation: Secondary | ICD-10-CM | POA: Diagnosis not present

## 2020-01-26 NOTE — Telephone Encounter (Signed)
Sent. Thanks.   

## 2020-01-27 DIAGNOSIS — I110 Hypertensive heart disease with heart failure: Secondary | ICD-10-CM | POA: Diagnosis not present

## 2020-01-27 DIAGNOSIS — I5033 Acute on chronic diastolic (congestive) heart failure: Secondary | ICD-10-CM | POA: Diagnosis not present

## 2020-01-27 DIAGNOSIS — I480 Paroxysmal atrial fibrillation: Secondary | ICD-10-CM | POA: Diagnosis not present

## 2020-01-27 DIAGNOSIS — D649 Anemia, unspecified: Secondary | ICD-10-CM | POA: Diagnosis not present

## 2020-01-27 DIAGNOSIS — E785 Hyperlipidemia, unspecified: Secondary | ICD-10-CM | POA: Diagnosis not present

## 2020-01-27 DIAGNOSIS — I25118 Atherosclerotic heart disease of native coronary artery with other forms of angina pectoris: Secondary | ICD-10-CM | POA: Diagnosis not present

## 2020-02-03 DIAGNOSIS — I5033 Acute on chronic diastolic (congestive) heart failure: Secondary | ICD-10-CM | POA: Diagnosis not present

## 2020-02-03 DIAGNOSIS — D649 Anemia, unspecified: Secondary | ICD-10-CM | POA: Diagnosis not present

## 2020-02-03 DIAGNOSIS — E785 Hyperlipidemia, unspecified: Secondary | ICD-10-CM | POA: Diagnosis not present

## 2020-02-03 DIAGNOSIS — I480 Paroxysmal atrial fibrillation: Secondary | ICD-10-CM | POA: Diagnosis not present

## 2020-02-03 DIAGNOSIS — I25118 Atherosclerotic heart disease of native coronary artery with other forms of angina pectoris: Secondary | ICD-10-CM | POA: Diagnosis not present

## 2020-02-03 DIAGNOSIS — I110 Hypertensive heart disease with heart failure: Secondary | ICD-10-CM | POA: Diagnosis not present

## 2020-02-25 ENCOUNTER — Other Ambulatory Visit: Payer: Self-pay | Admitting: *Deleted

## 2020-02-25 MED ORDER — OXYBUTYNIN CHLORIDE 5 MG PO TABS: ORAL_TABLET | ORAL | 12 refills | Status: AC

## 2020-03-06 ENCOUNTER — Other Ambulatory Visit: Payer: Self-pay | Admitting: Family Medicine

## 2020-03-06 NOTE — Telephone Encounter (Signed)
Refill request Colace Last office visit:   02/19/2018 Last Filled:    180 capsule 1 03/02/2019   IMDUR Last office visit:   02/19/2018 Last Filled:    90 tablet 1 08/27/2019   Losartan Last office visit:   02/19/2018 Last Filled:   15 tablet 12 08/31/2019   Please advise because of lapse of time being seen in the office.

## 2020-03-07 NOTE — Telephone Encounter (Signed)
Please check with hospice to see if she still able to take these medications by mouth and let me know.  Please get update on her status.  Thanks.

## 2020-03-07 NOTE — Telephone Encounter (Signed)
Yes, still taking po meds.

## 2020-03-08 NOTE — Telephone Encounter (Signed)
Sent. Thanks.   

## 2020-03-09 DIAGNOSIS — I69818 Other symptoms and signs involving cognitive functions following other cerebrovascular disease: Secondary | ICD-10-CM | POA: Diagnosis not present

## 2020-03-09 DIAGNOSIS — D485 Neoplasm of uncertain behavior of skin: Secondary | ICD-10-CM | POA: Diagnosis not present

## 2020-03-09 DIAGNOSIS — I110 Hypertensive heart disease with heart failure: Secondary | ICD-10-CM | POA: Diagnosis not present

## 2020-03-09 DIAGNOSIS — I480 Paroxysmal atrial fibrillation: Secondary | ICD-10-CM | POA: Diagnosis not present

## 2020-03-09 DIAGNOSIS — Z87440 Personal history of urinary (tract) infections: Secondary | ICD-10-CM | POA: Diagnosis not present

## 2020-03-09 DIAGNOSIS — L89152 Pressure ulcer of sacral region, stage 2: Secondary | ICD-10-CM | POA: Diagnosis not present

## 2020-03-09 DIAGNOSIS — R32 Unspecified urinary incontinence: Secondary | ICD-10-CM | POA: Diagnosis not present

## 2020-03-09 DIAGNOSIS — Z466 Encounter for fitting and adjustment of urinary device: Secondary | ICD-10-CM | POA: Diagnosis not present

## 2020-03-09 DIAGNOSIS — R319 Hematuria, unspecified: Secondary | ICD-10-CM | POA: Diagnosis not present

## 2020-03-09 DIAGNOSIS — R159 Full incontinence of feces: Secondary | ICD-10-CM | POA: Diagnosis not present

## 2020-03-09 DIAGNOSIS — K219 Gastro-esophageal reflux disease without esophagitis: Secondary | ICD-10-CM | POA: Diagnosis not present

## 2020-03-09 DIAGNOSIS — R441 Visual hallucinations: Secondary | ICD-10-CM | POA: Diagnosis not present

## 2020-03-09 DIAGNOSIS — D649 Anemia, unspecified: Secondary | ICD-10-CM | POA: Diagnosis not present

## 2020-03-09 DIAGNOSIS — E039 Hypothyroidism, unspecified: Secondary | ICD-10-CM | POA: Diagnosis not present

## 2020-03-09 DIAGNOSIS — Z951 Presence of aortocoronary bypass graft: Secondary | ICD-10-CM | POA: Diagnosis not present

## 2020-03-09 DIAGNOSIS — I25118 Atherosclerotic heart disease of native coronary artery with other forms of angina pectoris: Secondary | ICD-10-CM | POA: Diagnosis not present

## 2020-03-09 DIAGNOSIS — F0150 Vascular dementia without behavioral disturbance: Secondary | ICD-10-CM | POA: Diagnosis not present

## 2020-03-09 DIAGNOSIS — R627 Adult failure to thrive: Secondary | ICD-10-CM | POA: Diagnosis not present

## 2020-03-09 DIAGNOSIS — I5033 Acute on chronic diastolic (congestive) heart failure: Secondary | ICD-10-CM | POA: Diagnosis not present

## 2020-03-09 DIAGNOSIS — E785 Hyperlipidemia, unspecified: Secondary | ICD-10-CM | POA: Diagnosis not present

## 2020-03-09 DIAGNOSIS — Z9981 Dependence on supplemental oxygen: Secondary | ICD-10-CM | POA: Diagnosis not present

## 2020-03-09 DIAGNOSIS — I69822 Dysarthria following other cerebrovascular disease: Secondary | ICD-10-CM | POA: Diagnosis not present

## 2020-03-09 DIAGNOSIS — Z681 Body mass index (BMI) 19 or less, adult: Secondary | ICD-10-CM | POA: Diagnosis not present

## 2020-03-09 DIAGNOSIS — I69891 Dysphagia following other cerebrovascular disease: Secondary | ICD-10-CM | POA: Diagnosis not present

## 2020-03-13 ENCOUNTER — Telehealth: Payer: Self-pay | Admitting: Family Medicine

## 2020-03-13 DIAGNOSIS — I5033 Acute on chronic diastolic (congestive) heart failure: Secondary | ICD-10-CM | POA: Diagnosis not present

## 2020-03-13 DIAGNOSIS — E785 Hyperlipidemia, unspecified: Secondary | ICD-10-CM | POA: Diagnosis not present

## 2020-03-13 DIAGNOSIS — I110 Hypertensive heart disease with heart failure: Secondary | ICD-10-CM | POA: Diagnosis not present

## 2020-03-13 DIAGNOSIS — I480 Paroxysmal atrial fibrillation: Secondary | ICD-10-CM | POA: Diagnosis not present

## 2020-03-13 DIAGNOSIS — I25118 Atherosclerotic heart disease of native coronary artery with other forms of angina pectoris: Secondary | ICD-10-CM | POA: Diagnosis not present

## 2020-03-13 DIAGNOSIS — D649 Anemia, unspecified: Secondary | ICD-10-CM | POA: Diagnosis not present

## 2020-03-13 NOTE — Telephone Encounter (Signed)
Hospice patient.  Please check with family and let them know that I had been thinking about Jill Howell in the meantime.  Please let me know if I can do anything.  Thanks.

## 2020-03-14 NOTE — Telephone Encounter (Addendum)
Left detailed message on voicemail of son [name]).

## 2020-03-15 DIAGNOSIS — I5033 Acute on chronic diastolic (congestive) heart failure: Secondary | ICD-10-CM | POA: Diagnosis not present

## 2020-03-15 DIAGNOSIS — I110 Hypertensive heart disease with heart failure: Secondary | ICD-10-CM | POA: Diagnosis not present

## 2020-03-15 DIAGNOSIS — I480 Paroxysmal atrial fibrillation: Secondary | ICD-10-CM | POA: Diagnosis not present

## 2020-03-15 DIAGNOSIS — D649 Anemia, unspecified: Secondary | ICD-10-CM | POA: Diagnosis not present

## 2020-03-15 DIAGNOSIS — I25118 Atherosclerotic heart disease of native coronary artery with other forms of angina pectoris: Secondary | ICD-10-CM | POA: Diagnosis not present

## 2020-03-15 DIAGNOSIS — E785 Hyperlipidemia, unspecified: Secondary | ICD-10-CM | POA: Diagnosis not present

## 2020-03-15 NOTE — Telephone Encounter (Signed)
He said she has had 15 mini stokes and has popped right out of them. He don't think its going to be long now. He appreciate the call and he wanted to report that to you.

## 2020-03-15 NOTE — Telephone Encounter (Signed)
Noted.  Thanks for the update on this kind lady.  I appreciate the help of all involved.

## 2020-03-17 DIAGNOSIS — I480 Paroxysmal atrial fibrillation: Secondary | ICD-10-CM | POA: Diagnosis not present

## 2020-03-17 DIAGNOSIS — D649 Anemia, unspecified: Secondary | ICD-10-CM | POA: Diagnosis not present

## 2020-03-17 DIAGNOSIS — I110 Hypertensive heart disease with heart failure: Secondary | ICD-10-CM | POA: Diagnosis not present

## 2020-03-17 DIAGNOSIS — E785 Hyperlipidemia, unspecified: Secondary | ICD-10-CM | POA: Diagnosis not present

## 2020-03-17 DIAGNOSIS — I25118 Atherosclerotic heart disease of native coronary artery with other forms of angina pectoris: Secondary | ICD-10-CM | POA: Diagnosis not present

## 2020-03-17 DIAGNOSIS — I5033 Acute on chronic diastolic (congestive) heart failure: Secondary | ICD-10-CM | POA: Diagnosis not present

## 2020-03-21 DIAGNOSIS — I25118 Atherosclerotic heart disease of native coronary artery with other forms of angina pectoris: Secondary | ICD-10-CM | POA: Diagnosis not present

## 2020-03-21 DIAGNOSIS — I110 Hypertensive heart disease with heart failure: Secondary | ICD-10-CM | POA: Diagnosis not present

## 2020-03-21 DIAGNOSIS — E785 Hyperlipidemia, unspecified: Secondary | ICD-10-CM | POA: Diagnosis not present

## 2020-03-21 DIAGNOSIS — I480 Paroxysmal atrial fibrillation: Secondary | ICD-10-CM | POA: Diagnosis not present

## 2020-03-21 DIAGNOSIS — I5033 Acute on chronic diastolic (congestive) heart failure: Secondary | ICD-10-CM | POA: Diagnosis not present

## 2020-03-21 DIAGNOSIS — D649 Anemia, unspecified: Secondary | ICD-10-CM | POA: Diagnosis not present

## 2020-03-23 DIAGNOSIS — I480 Paroxysmal atrial fibrillation: Secondary | ICD-10-CM | POA: Diagnosis not present

## 2020-03-23 DIAGNOSIS — I110 Hypertensive heart disease with heart failure: Secondary | ICD-10-CM | POA: Diagnosis not present

## 2020-03-23 DIAGNOSIS — E785 Hyperlipidemia, unspecified: Secondary | ICD-10-CM | POA: Diagnosis not present

## 2020-03-23 DIAGNOSIS — D649 Anemia, unspecified: Secondary | ICD-10-CM | POA: Diagnosis not present

## 2020-03-23 DIAGNOSIS — I25118 Atherosclerotic heart disease of native coronary artery with other forms of angina pectoris: Secondary | ICD-10-CM | POA: Diagnosis not present

## 2020-03-23 DIAGNOSIS — I5033 Acute on chronic diastolic (congestive) heart failure: Secondary | ICD-10-CM | POA: Diagnosis not present

## 2020-03-27 DIAGNOSIS — I5033 Acute on chronic diastolic (congestive) heart failure: Secondary | ICD-10-CM | POA: Diagnosis not present

## 2020-03-27 DIAGNOSIS — I25118 Atherosclerotic heart disease of native coronary artery with other forms of angina pectoris: Secondary | ICD-10-CM | POA: Diagnosis not present

## 2020-03-27 DIAGNOSIS — I480 Paroxysmal atrial fibrillation: Secondary | ICD-10-CM | POA: Diagnosis not present

## 2020-03-27 DIAGNOSIS — D649 Anemia, unspecified: Secondary | ICD-10-CM | POA: Diagnosis not present

## 2020-03-27 DIAGNOSIS — I110 Hypertensive heart disease with heart failure: Secondary | ICD-10-CM | POA: Diagnosis not present

## 2020-03-27 DIAGNOSIS — E785 Hyperlipidemia, unspecified: Secondary | ICD-10-CM | POA: Diagnosis not present

## 2020-03-28 DIAGNOSIS — I110 Hypertensive heart disease with heart failure: Secondary | ICD-10-CM | POA: Diagnosis not present

## 2020-03-28 DIAGNOSIS — I25118 Atherosclerotic heart disease of native coronary artery with other forms of angina pectoris: Secondary | ICD-10-CM | POA: Diagnosis not present

## 2020-03-28 DIAGNOSIS — I480 Paroxysmal atrial fibrillation: Secondary | ICD-10-CM | POA: Diagnosis not present

## 2020-03-28 DIAGNOSIS — I5033 Acute on chronic diastolic (congestive) heart failure: Secondary | ICD-10-CM | POA: Diagnosis not present

## 2020-03-28 DIAGNOSIS — D649 Anemia, unspecified: Secondary | ICD-10-CM | POA: Diagnosis not present

## 2020-03-28 DIAGNOSIS — E785 Hyperlipidemia, unspecified: Secondary | ICD-10-CM | POA: Diagnosis not present

## 2020-03-31 DIAGNOSIS — D649 Anemia, unspecified: Secondary | ICD-10-CM | POA: Diagnosis not present

## 2020-03-31 DIAGNOSIS — I110 Hypertensive heart disease with heart failure: Secondary | ICD-10-CM | POA: Diagnosis not present

## 2020-03-31 DIAGNOSIS — I480 Paroxysmal atrial fibrillation: Secondary | ICD-10-CM | POA: Diagnosis not present

## 2020-03-31 DIAGNOSIS — I5033 Acute on chronic diastolic (congestive) heart failure: Secondary | ICD-10-CM | POA: Diagnosis not present

## 2020-03-31 DIAGNOSIS — I25118 Atherosclerotic heart disease of native coronary artery with other forms of angina pectoris: Secondary | ICD-10-CM | POA: Diagnosis not present

## 2020-03-31 DIAGNOSIS — E785 Hyperlipidemia, unspecified: Secondary | ICD-10-CM | POA: Diagnosis not present

## 2020-04-03 DIAGNOSIS — I480 Paroxysmal atrial fibrillation: Secondary | ICD-10-CM | POA: Diagnosis not present

## 2020-04-03 DIAGNOSIS — I110 Hypertensive heart disease with heart failure: Secondary | ICD-10-CM | POA: Diagnosis not present

## 2020-04-03 DIAGNOSIS — D649 Anemia, unspecified: Secondary | ICD-10-CM | POA: Diagnosis not present

## 2020-04-03 DIAGNOSIS — I5033 Acute on chronic diastolic (congestive) heart failure: Secondary | ICD-10-CM | POA: Diagnosis not present

## 2020-04-03 DIAGNOSIS — E785 Hyperlipidemia, unspecified: Secondary | ICD-10-CM | POA: Diagnosis not present

## 2020-04-03 DIAGNOSIS — I25118 Atherosclerotic heart disease of native coronary artery with other forms of angina pectoris: Secondary | ICD-10-CM | POA: Diagnosis not present

## 2020-04-05 ENCOUNTER — Other Ambulatory Visit: Payer: Self-pay | Admitting: Family Medicine

## 2020-04-05 NOTE — Telephone Encounter (Signed)
Sent. Thanks.   

## 2020-04-05 NOTE — Telephone Encounter (Signed)
Last refill 08/31/19 #15/12 Hospice care

## 2020-04-09 DIAGNOSIS — I5033 Acute on chronic diastolic (congestive) heart failure: Secondary | ICD-10-CM | POA: Diagnosis not present

## 2020-04-09 DIAGNOSIS — R627 Adult failure to thrive: Secondary | ICD-10-CM | POA: Diagnosis not present

## 2020-04-09 DIAGNOSIS — R441 Visual hallucinations: Secondary | ICD-10-CM | POA: Diagnosis not present

## 2020-04-09 DIAGNOSIS — I110 Hypertensive heart disease with heart failure: Secondary | ICD-10-CM | POA: Diagnosis not present

## 2020-04-09 DIAGNOSIS — D485 Neoplasm of uncertain behavior of skin: Secondary | ICD-10-CM | POA: Diagnosis not present

## 2020-04-09 DIAGNOSIS — E039 Hypothyroidism, unspecified: Secondary | ICD-10-CM | POA: Diagnosis not present

## 2020-04-09 DIAGNOSIS — I25118 Atherosclerotic heart disease of native coronary artery with other forms of angina pectoris: Secondary | ICD-10-CM | POA: Diagnosis not present

## 2020-04-09 DIAGNOSIS — Z951 Presence of aortocoronary bypass graft: Secondary | ICD-10-CM | POA: Diagnosis not present

## 2020-04-09 DIAGNOSIS — E785 Hyperlipidemia, unspecified: Secondary | ICD-10-CM | POA: Diagnosis not present

## 2020-04-09 DIAGNOSIS — Z87440 Personal history of urinary (tract) infections: Secondary | ICD-10-CM | POA: Diagnosis not present

## 2020-04-09 DIAGNOSIS — E43 Unspecified severe protein-calorie malnutrition: Secondary | ICD-10-CM | POA: Diagnosis not present

## 2020-04-09 DIAGNOSIS — D649 Anemia, unspecified: Secondary | ICD-10-CM | POA: Diagnosis not present

## 2020-04-09 DIAGNOSIS — I69891 Dysphagia following other cerebrovascular disease: Secondary | ICD-10-CM | POA: Diagnosis not present

## 2020-04-09 DIAGNOSIS — L89152 Pressure ulcer of sacral region, stage 2: Secondary | ICD-10-CM | POA: Diagnosis not present

## 2020-04-09 DIAGNOSIS — Z9981 Dependence on supplemental oxygen: Secondary | ICD-10-CM | POA: Diagnosis not present

## 2020-04-09 DIAGNOSIS — Z681 Body mass index (BMI) 19 or less, adult: Secondary | ICD-10-CM | POA: Diagnosis not present

## 2020-04-09 DIAGNOSIS — K219 Gastro-esophageal reflux disease without esophagitis: Secondary | ICD-10-CM | POA: Diagnosis not present

## 2020-04-09 DIAGNOSIS — Z466 Encounter for fitting and adjustment of urinary device: Secondary | ICD-10-CM | POA: Diagnosis not present

## 2020-04-09 DIAGNOSIS — I69822 Dysarthria following other cerebrovascular disease: Secondary | ICD-10-CM | POA: Diagnosis not present

## 2020-04-09 DIAGNOSIS — F0150 Vascular dementia without behavioral disturbance: Secondary | ICD-10-CM | POA: Diagnosis not present

## 2020-04-09 DIAGNOSIS — R319 Hematuria, unspecified: Secondary | ICD-10-CM | POA: Diagnosis not present

## 2020-04-09 DIAGNOSIS — I69818 Other symptoms and signs involving cognitive functions following other cerebrovascular disease: Secondary | ICD-10-CM | POA: Diagnosis not present

## 2020-04-09 DIAGNOSIS — R32 Unspecified urinary incontinence: Secondary | ICD-10-CM | POA: Diagnosis not present

## 2020-04-09 DIAGNOSIS — I480 Paroxysmal atrial fibrillation: Secondary | ICD-10-CM | POA: Diagnosis not present

## 2020-04-09 DIAGNOSIS — R159 Full incontinence of feces: Secondary | ICD-10-CM | POA: Diagnosis not present

## 2020-04-11 DIAGNOSIS — I110 Hypertensive heart disease with heart failure: Secondary | ICD-10-CM | POA: Diagnosis not present

## 2020-04-11 DIAGNOSIS — I5033 Acute on chronic diastolic (congestive) heart failure: Secondary | ICD-10-CM | POA: Diagnosis not present

## 2020-04-11 DIAGNOSIS — I480 Paroxysmal atrial fibrillation: Secondary | ICD-10-CM | POA: Diagnosis not present

## 2020-04-11 DIAGNOSIS — I25118 Atherosclerotic heart disease of native coronary artery with other forms of angina pectoris: Secondary | ICD-10-CM | POA: Diagnosis not present

## 2020-04-11 DIAGNOSIS — D649 Anemia, unspecified: Secondary | ICD-10-CM | POA: Diagnosis not present

## 2020-04-11 DIAGNOSIS — E785 Hyperlipidemia, unspecified: Secondary | ICD-10-CM | POA: Diagnosis not present

## 2020-04-12 ENCOUNTER — Other Ambulatory Visit: Payer: Self-pay | Admitting: Family Medicine

## 2020-04-12 DIAGNOSIS — I480 Paroxysmal atrial fibrillation: Secondary | ICD-10-CM | POA: Diagnosis not present

## 2020-04-12 DIAGNOSIS — I5033 Acute on chronic diastolic (congestive) heart failure: Secondary | ICD-10-CM | POA: Diagnosis not present

## 2020-04-12 DIAGNOSIS — I25118 Atherosclerotic heart disease of native coronary artery with other forms of angina pectoris: Secondary | ICD-10-CM | POA: Diagnosis not present

## 2020-04-12 DIAGNOSIS — D649 Anemia, unspecified: Secondary | ICD-10-CM | POA: Diagnosis not present

## 2020-04-12 DIAGNOSIS — I110 Hypertensive heart disease with heart failure: Secondary | ICD-10-CM | POA: Diagnosis not present

## 2020-04-12 DIAGNOSIS — E785 Hyperlipidemia, unspecified: Secondary | ICD-10-CM | POA: Diagnosis not present

## 2020-04-12 NOTE — Telephone Encounter (Signed)
Sent. Thanks.   

## 2020-04-12 NOTE — Telephone Encounter (Signed)
Last office visit 02/19/18 acute Last refill 10/05/19 #60/1

## 2020-04-18 DIAGNOSIS — E785 Hyperlipidemia, unspecified: Secondary | ICD-10-CM | POA: Diagnosis not present

## 2020-04-18 DIAGNOSIS — D649 Anemia, unspecified: Secondary | ICD-10-CM | POA: Diagnosis not present

## 2020-04-18 DIAGNOSIS — I5033 Acute on chronic diastolic (congestive) heart failure: Secondary | ICD-10-CM | POA: Diagnosis not present

## 2020-04-18 DIAGNOSIS — I25118 Atherosclerotic heart disease of native coronary artery with other forms of angina pectoris: Secondary | ICD-10-CM | POA: Diagnosis not present

## 2020-04-18 DIAGNOSIS — I480 Paroxysmal atrial fibrillation: Secondary | ICD-10-CM | POA: Diagnosis not present

## 2020-04-18 DIAGNOSIS — I110 Hypertensive heart disease with heart failure: Secondary | ICD-10-CM | POA: Diagnosis not present

## 2020-04-25 DIAGNOSIS — D649 Anemia, unspecified: Secondary | ICD-10-CM | POA: Diagnosis not present

## 2020-04-25 DIAGNOSIS — I480 Paroxysmal atrial fibrillation: Secondary | ICD-10-CM | POA: Diagnosis not present

## 2020-04-25 DIAGNOSIS — I5033 Acute on chronic diastolic (congestive) heart failure: Secondary | ICD-10-CM | POA: Diagnosis not present

## 2020-04-25 DIAGNOSIS — I110 Hypertensive heart disease with heart failure: Secondary | ICD-10-CM | POA: Diagnosis not present

## 2020-04-25 DIAGNOSIS — I25118 Atherosclerotic heart disease of native coronary artery with other forms of angina pectoris: Secondary | ICD-10-CM | POA: Diagnosis not present

## 2020-04-25 DIAGNOSIS — E785 Hyperlipidemia, unspecified: Secondary | ICD-10-CM | POA: Diagnosis not present

## 2020-05-01 DIAGNOSIS — I480 Paroxysmal atrial fibrillation: Secondary | ICD-10-CM | POA: Diagnosis not present

## 2020-05-01 DIAGNOSIS — D649 Anemia, unspecified: Secondary | ICD-10-CM | POA: Diagnosis not present

## 2020-05-01 DIAGNOSIS — E785 Hyperlipidemia, unspecified: Secondary | ICD-10-CM | POA: Diagnosis not present

## 2020-05-01 DIAGNOSIS — I25118 Atherosclerotic heart disease of native coronary artery with other forms of angina pectoris: Secondary | ICD-10-CM | POA: Diagnosis not present

## 2020-05-01 DIAGNOSIS — I110 Hypertensive heart disease with heart failure: Secondary | ICD-10-CM | POA: Diagnosis not present

## 2020-05-01 DIAGNOSIS — I5033 Acute on chronic diastolic (congestive) heart failure: Secondary | ICD-10-CM | POA: Diagnosis not present

## 2020-05-10 DIAGNOSIS — I69822 Dysarthria following other cerebrovascular disease: Secondary | ICD-10-CM | POA: Diagnosis not present

## 2020-05-10 DIAGNOSIS — I69818 Other symptoms and signs involving cognitive functions following other cerebrovascular disease: Secondary | ICD-10-CM | POA: Diagnosis not present

## 2020-05-10 DIAGNOSIS — Z681 Body mass index (BMI) 19 or less, adult: Secondary | ICD-10-CM | POA: Diagnosis not present

## 2020-05-10 DIAGNOSIS — L89152 Pressure ulcer of sacral region, stage 2: Secondary | ICD-10-CM | POA: Diagnosis not present

## 2020-05-10 DIAGNOSIS — R441 Visual hallucinations: Secondary | ICD-10-CM | POA: Diagnosis not present

## 2020-05-10 DIAGNOSIS — E785 Hyperlipidemia, unspecified: Secondary | ICD-10-CM | POA: Diagnosis not present

## 2020-05-10 DIAGNOSIS — K219 Gastro-esophageal reflux disease without esophagitis: Secondary | ICD-10-CM | POA: Diagnosis not present

## 2020-05-10 DIAGNOSIS — R627 Adult failure to thrive: Secondary | ICD-10-CM | POA: Diagnosis not present

## 2020-05-10 DIAGNOSIS — D485 Neoplasm of uncertain behavior of skin: Secondary | ICD-10-CM | POA: Diagnosis not present

## 2020-05-10 DIAGNOSIS — I5033 Acute on chronic diastolic (congestive) heart failure: Secondary | ICD-10-CM | POA: Diagnosis not present

## 2020-05-10 DIAGNOSIS — R32 Unspecified urinary incontinence: Secondary | ICD-10-CM | POA: Diagnosis not present

## 2020-05-10 DIAGNOSIS — R159 Full incontinence of feces: Secondary | ICD-10-CM | POA: Diagnosis not present

## 2020-05-10 DIAGNOSIS — Z9981 Dependence on supplemental oxygen: Secondary | ICD-10-CM | POA: Diagnosis not present

## 2020-05-10 DIAGNOSIS — D649 Anemia, unspecified: Secondary | ICD-10-CM | POA: Diagnosis not present

## 2020-05-10 DIAGNOSIS — R319 Hematuria, unspecified: Secondary | ICD-10-CM | POA: Diagnosis not present

## 2020-05-10 DIAGNOSIS — E43 Unspecified severe protein-calorie malnutrition: Secondary | ICD-10-CM | POA: Diagnosis not present

## 2020-05-10 DIAGNOSIS — E039 Hypothyroidism, unspecified: Secondary | ICD-10-CM | POA: Diagnosis not present

## 2020-05-10 DIAGNOSIS — F0150 Vascular dementia without behavioral disturbance: Secondary | ICD-10-CM | POA: Diagnosis not present

## 2020-05-10 DIAGNOSIS — I69891 Dysphagia following other cerebrovascular disease: Secondary | ICD-10-CM | POA: Diagnosis not present

## 2020-05-10 DIAGNOSIS — Z466 Encounter for fitting and adjustment of urinary device: Secondary | ICD-10-CM | POA: Diagnosis not present

## 2020-05-10 DIAGNOSIS — I480 Paroxysmal atrial fibrillation: Secondary | ICD-10-CM | POA: Diagnosis not present

## 2020-05-10 DIAGNOSIS — Z87440 Personal history of urinary (tract) infections: Secondary | ICD-10-CM | POA: Diagnosis not present

## 2020-05-10 DIAGNOSIS — I110 Hypertensive heart disease with heart failure: Secondary | ICD-10-CM | POA: Diagnosis not present

## 2020-05-10 DIAGNOSIS — Z951 Presence of aortocoronary bypass graft: Secondary | ICD-10-CM | POA: Diagnosis not present

## 2020-05-10 DIAGNOSIS — I25118 Atherosclerotic heart disease of native coronary artery with other forms of angina pectoris: Secondary | ICD-10-CM | POA: Diagnosis not present

## 2020-05-11 DIAGNOSIS — D649 Anemia, unspecified: Secondary | ICD-10-CM | POA: Diagnosis not present

## 2020-05-11 DIAGNOSIS — I110 Hypertensive heart disease with heart failure: Secondary | ICD-10-CM | POA: Diagnosis not present

## 2020-05-11 DIAGNOSIS — I25118 Atherosclerotic heart disease of native coronary artery with other forms of angina pectoris: Secondary | ICD-10-CM | POA: Diagnosis not present

## 2020-05-11 DIAGNOSIS — I5033 Acute on chronic diastolic (congestive) heart failure: Secondary | ICD-10-CM | POA: Diagnosis not present

## 2020-05-11 DIAGNOSIS — I480 Paroxysmal atrial fibrillation: Secondary | ICD-10-CM | POA: Diagnosis not present

## 2020-05-11 DIAGNOSIS — E785 Hyperlipidemia, unspecified: Secondary | ICD-10-CM | POA: Diagnosis not present

## 2020-05-12 DIAGNOSIS — I110 Hypertensive heart disease with heart failure: Secondary | ICD-10-CM | POA: Diagnosis not present

## 2020-05-12 DIAGNOSIS — D649 Anemia, unspecified: Secondary | ICD-10-CM | POA: Diagnosis not present

## 2020-05-12 DIAGNOSIS — I480 Paroxysmal atrial fibrillation: Secondary | ICD-10-CM | POA: Diagnosis not present

## 2020-05-12 DIAGNOSIS — E785 Hyperlipidemia, unspecified: Secondary | ICD-10-CM | POA: Diagnosis not present

## 2020-05-12 DIAGNOSIS — I25118 Atherosclerotic heart disease of native coronary artery with other forms of angina pectoris: Secondary | ICD-10-CM | POA: Diagnosis not present

## 2020-05-12 DIAGNOSIS — I5033 Acute on chronic diastolic (congestive) heart failure: Secondary | ICD-10-CM | POA: Diagnosis not present

## 2020-05-13 DIAGNOSIS — D649 Anemia, unspecified: Secondary | ICD-10-CM | POA: Diagnosis not present

## 2020-05-13 DIAGNOSIS — E785 Hyperlipidemia, unspecified: Secondary | ICD-10-CM | POA: Diagnosis not present

## 2020-05-13 DIAGNOSIS — I110 Hypertensive heart disease with heart failure: Secondary | ICD-10-CM | POA: Diagnosis not present

## 2020-05-13 DIAGNOSIS — I480 Paroxysmal atrial fibrillation: Secondary | ICD-10-CM | POA: Diagnosis not present

## 2020-05-13 DIAGNOSIS — I25118 Atherosclerotic heart disease of native coronary artery with other forms of angina pectoris: Secondary | ICD-10-CM | POA: Diagnosis not present

## 2020-05-13 DIAGNOSIS — I5033 Acute on chronic diastolic (congestive) heart failure: Secondary | ICD-10-CM | POA: Diagnosis not present

## 2020-05-14 DIAGNOSIS — I5033 Acute on chronic diastolic (congestive) heart failure: Secondary | ICD-10-CM | POA: Diagnosis not present

## 2020-05-14 DIAGNOSIS — D649 Anemia, unspecified: Secondary | ICD-10-CM | POA: Diagnosis not present

## 2020-05-14 DIAGNOSIS — E785 Hyperlipidemia, unspecified: Secondary | ICD-10-CM | POA: Diagnosis not present

## 2020-05-14 DIAGNOSIS — I25118 Atherosclerotic heart disease of native coronary artery with other forms of angina pectoris: Secondary | ICD-10-CM | POA: Diagnosis not present

## 2020-05-14 DIAGNOSIS — I480 Paroxysmal atrial fibrillation: Secondary | ICD-10-CM | POA: Diagnosis not present

## 2020-05-14 DIAGNOSIS — I110 Hypertensive heart disease with heart failure: Secondary | ICD-10-CM | POA: Diagnosis not present

## 2020-05-15 DIAGNOSIS — E785 Hyperlipidemia, unspecified: Secondary | ICD-10-CM | POA: Diagnosis not present

## 2020-05-15 DIAGNOSIS — I5033 Acute on chronic diastolic (congestive) heart failure: Secondary | ICD-10-CM | POA: Diagnosis not present

## 2020-05-15 DIAGNOSIS — I110 Hypertensive heart disease with heart failure: Secondary | ICD-10-CM | POA: Diagnosis not present

## 2020-05-15 DIAGNOSIS — I480 Paroxysmal atrial fibrillation: Secondary | ICD-10-CM | POA: Diagnosis not present

## 2020-05-15 DIAGNOSIS — D649 Anemia, unspecified: Secondary | ICD-10-CM | POA: Diagnosis not present

## 2020-05-15 DIAGNOSIS — I25118 Atherosclerotic heart disease of native coronary artery with other forms of angina pectoris: Secondary | ICD-10-CM | POA: Diagnosis not present

## 2020-05-16 DIAGNOSIS — I5033 Acute on chronic diastolic (congestive) heart failure: Secondary | ICD-10-CM | POA: Diagnosis not present

## 2020-05-16 DIAGNOSIS — D649 Anemia, unspecified: Secondary | ICD-10-CM | POA: Diagnosis not present

## 2020-05-16 DIAGNOSIS — I25118 Atherosclerotic heart disease of native coronary artery with other forms of angina pectoris: Secondary | ICD-10-CM | POA: Diagnosis not present

## 2020-05-16 DIAGNOSIS — E785 Hyperlipidemia, unspecified: Secondary | ICD-10-CM | POA: Diagnosis not present

## 2020-05-16 DIAGNOSIS — I480 Paroxysmal atrial fibrillation: Secondary | ICD-10-CM | POA: Diagnosis not present

## 2020-05-16 DIAGNOSIS — I110 Hypertensive heart disease with heart failure: Secondary | ICD-10-CM | POA: Diagnosis not present

## 2020-05-17 DIAGNOSIS — I480 Paroxysmal atrial fibrillation: Secondary | ICD-10-CM | POA: Diagnosis not present

## 2020-05-17 DIAGNOSIS — I110 Hypertensive heart disease with heart failure: Secondary | ICD-10-CM | POA: Diagnosis not present

## 2020-05-17 DIAGNOSIS — I25118 Atherosclerotic heart disease of native coronary artery with other forms of angina pectoris: Secondary | ICD-10-CM | POA: Diagnosis not present

## 2020-05-17 DIAGNOSIS — I5033 Acute on chronic diastolic (congestive) heart failure: Secondary | ICD-10-CM | POA: Diagnosis not present

## 2020-05-17 DIAGNOSIS — E785 Hyperlipidemia, unspecified: Secondary | ICD-10-CM | POA: Diagnosis not present

## 2020-05-17 DIAGNOSIS — D649 Anemia, unspecified: Secondary | ICD-10-CM | POA: Diagnosis not present

## 2020-05-18 DIAGNOSIS — I5033 Acute on chronic diastolic (congestive) heart failure: Secondary | ICD-10-CM | POA: Diagnosis not present

## 2020-05-18 DIAGNOSIS — I25118 Atherosclerotic heart disease of native coronary artery with other forms of angina pectoris: Secondary | ICD-10-CM | POA: Diagnosis not present

## 2020-05-18 DIAGNOSIS — I110 Hypertensive heart disease with heart failure: Secondary | ICD-10-CM | POA: Diagnosis not present

## 2020-05-18 DIAGNOSIS — I480 Paroxysmal atrial fibrillation: Secondary | ICD-10-CM | POA: Diagnosis not present

## 2020-05-18 DIAGNOSIS — E785 Hyperlipidemia, unspecified: Secondary | ICD-10-CM | POA: Diagnosis not present

## 2020-05-18 DIAGNOSIS — D649 Anemia, unspecified: Secondary | ICD-10-CM | POA: Diagnosis not present

## 2020-05-29 DIAGNOSIS — E785 Hyperlipidemia, unspecified: Secondary | ICD-10-CM | POA: Diagnosis not present

## 2020-05-29 DIAGNOSIS — I5033 Acute on chronic diastolic (congestive) heart failure: Secondary | ICD-10-CM | POA: Diagnosis not present

## 2020-05-29 DIAGNOSIS — I110 Hypertensive heart disease with heart failure: Secondary | ICD-10-CM | POA: Diagnosis not present

## 2020-05-29 DIAGNOSIS — I25118 Atherosclerotic heart disease of native coronary artery with other forms of angina pectoris: Secondary | ICD-10-CM | POA: Diagnosis not present

## 2020-05-29 DIAGNOSIS — D649 Anemia, unspecified: Secondary | ICD-10-CM | POA: Diagnosis not present

## 2020-05-29 DIAGNOSIS — I480 Paroxysmal atrial fibrillation: Secondary | ICD-10-CM | POA: Diagnosis not present

## 2020-05-31 DIAGNOSIS — I110 Hypertensive heart disease with heart failure: Secondary | ICD-10-CM | POA: Diagnosis not present

## 2020-05-31 DIAGNOSIS — D649 Anemia, unspecified: Secondary | ICD-10-CM | POA: Diagnosis not present

## 2020-05-31 DIAGNOSIS — I5033 Acute on chronic diastolic (congestive) heart failure: Secondary | ICD-10-CM | POA: Diagnosis not present

## 2020-05-31 DIAGNOSIS — I25118 Atherosclerotic heart disease of native coronary artery with other forms of angina pectoris: Secondary | ICD-10-CM | POA: Diagnosis not present

## 2020-05-31 DIAGNOSIS — E785 Hyperlipidemia, unspecified: Secondary | ICD-10-CM | POA: Diagnosis not present

## 2020-05-31 DIAGNOSIS — I480 Paroxysmal atrial fibrillation: Secondary | ICD-10-CM | POA: Diagnosis not present

## 2020-06-09 DIAGNOSIS — Z951 Presence of aortocoronary bypass graft: Secondary | ICD-10-CM | POA: Diagnosis not present

## 2020-06-09 DIAGNOSIS — E43 Unspecified severe protein-calorie malnutrition: Secondary | ICD-10-CM | POA: Diagnosis not present

## 2020-06-09 DIAGNOSIS — R441 Visual hallucinations: Secondary | ICD-10-CM | POA: Diagnosis not present

## 2020-06-09 DIAGNOSIS — F0150 Vascular dementia without behavioral disturbance: Secondary | ICD-10-CM | POA: Diagnosis not present

## 2020-06-09 DIAGNOSIS — E039 Hypothyroidism, unspecified: Secondary | ICD-10-CM | POA: Diagnosis not present

## 2020-06-09 DIAGNOSIS — D485 Neoplasm of uncertain behavior of skin: Secondary | ICD-10-CM | POA: Diagnosis not present

## 2020-06-09 DIAGNOSIS — R319 Hematuria, unspecified: Secondary | ICD-10-CM | POA: Diagnosis not present

## 2020-06-09 DIAGNOSIS — R627 Adult failure to thrive: Secondary | ICD-10-CM | POA: Diagnosis not present

## 2020-06-09 DIAGNOSIS — I480 Paroxysmal atrial fibrillation: Secondary | ICD-10-CM | POA: Diagnosis not present

## 2020-06-09 DIAGNOSIS — Z9981 Dependence on supplemental oxygen: Secondary | ICD-10-CM | POA: Diagnosis not present

## 2020-06-09 DIAGNOSIS — E785 Hyperlipidemia, unspecified: Secondary | ICD-10-CM | POA: Diagnosis not present

## 2020-06-09 DIAGNOSIS — R64 Cachexia: Secondary | ICD-10-CM | POA: Diagnosis not present

## 2020-06-09 DIAGNOSIS — I110 Hypertensive heart disease with heart failure: Secondary | ICD-10-CM | POA: Diagnosis not present

## 2020-06-09 DIAGNOSIS — L89152 Pressure ulcer of sacral region, stage 2: Secondary | ICD-10-CM | POA: Diagnosis not present

## 2020-06-09 DIAGNOSIS — Z681 Body mass index (BMI) 19 or less, adult: Secondary | ICD-10-CM | POA: Diagnosis not present

## 2020-06-09 DIAGNOSIS — Z87440 Personal history of urinary (tract) infections: Secondary | ICD-10-CM | POA: Diagnosis not present

## 2020-06-09 DIAGNOSIS — I69822 Dysarthria following other cerebrovascular disease: Secondary | ICD-10-CM | POA: Diagnosis not present

## 2020-06-09 DIAGNOSIS — R159 Full incontinence of feces: Secondary | ICD-10-CM | POA: Diagnosis not present

## 2020-06-09 DIAGNOSIS — I69818 Other symptoms and signs involving cognitive functions following other cerebrovascular disease: Secondary | ICD-10-CM | POA: Diagnosis not present

## 2020-06-09 DIAGNOSIS — R32 Unspecified urinary incontinence: Secondary | ICD-10-CM | POA: Diagnosis not present

## 2020-06-09 DIAGNOSIS — I69891 Dysphagia following other cerebrovascular disease: Secondary | ICD-10-CM | POA: Diagnosis not present

## 2020-06-09 DIAGNOSIS — I25118 Atherosclerotic heart disease of native coronary artery with other forms of angina pectoris: Secondary | ICD-10-CM | POA: Diagnosis not present

## 2020-06-09 DIAGNOSIS — Z466 Encounter for fitting and adjustment of urinary device: Secondary | ICD-10-CM | POA: Diagnosis not present

## 2020-06-09 DIAGNOSIS — D649 Anemia, unspecified: Secondary | ICD-10-CM | POA: Diagnosis not present

## 2020-06-09 DIAGNOSIS — I5033 Acute on chronic diastolic (congestive) heart failure: Secondary | ICD-10-CM | POA: Diagnosis not present

## 2020-06-15 DIAGNOSIS — E785 Hyperlipidemia, unspecified: Secondary | ICD-10-CM | POA: Diagnosis not present

## 2020-06-15 DIAGNOSIS — I25118 Atherosclerotic heart disease of native coronary artery with other forms of angina pectoris: Secondary | ICD-10-CM | POA: Diagnosis not present

## 2020-06-15 DIAGNOSIS — I5033 Acute on chronic diastolic (congestive) heart failure: Secondary | ICD-10-CM | POA: Diagnosis not present

## 2020-06-15 DIAGNOSIS — D649 Anemia, unspecified: Secondary | ICD-10-CM | POA: Diagnosis not present

## 2020-06-15 DIAGNOSIS — I480 Paroxysmal atrial fibrillation: Secondary | ICD-10-CM | POA: Diagnosis not present

## 2020-06-15 DIAGNOSIS — I110 Hypertensive heart disease with heart failure: Secondary | ICD-10-CM | POA: Diagnosis not present

## 2020-06-19 ENCOUNTER — Other Ambulatory Visit: Payer: Self-pay | Admitting: Family Medicine

## 2020-06-19 DIAGNOSIS — D649 Anemia, unspecified: Secondary | ICD-10-CM | POA: Diagnosis not present

## 2020-06-19 DIAGNOSIS — I110 Hypertensive heart disease with heart failure: Secondary | ICD-10-CM | POA: Diagnosis not present

## 2020-06-19 DIAGNOSIS — I5033 Acute on chronic diastolic (congestive) heart failure: Secondary | ICD-10-CM | POA: Diagnosis not present

## 2020-06-19 DIAGNOSIS — I480 Paroxysmal atrial fibrillation: Secondary | ICD-10-CM | POA: Diagnosis not present

## 2020-06-19 DIAGNOSIS — I25118 Atherosclerotic heart disease of native coronary artery with other forms of angina pectoris: Secondary | ICD-10-CM | POA: Diagnosis not present

## 2020-06-19 DIAGNOSIS — E785 Hyperlipidemia, unspecified: Secondary | ICD-10-CM | POA: Diagnosis not present

## 2020-06-20 NOTE — Telephone Encounter (Signed)
Refill request Seroquel Last office visit 02/19/18 acute Last refill 01/26/20 #30/2

## 2020-06-20 NOTE — Telephone Encounter (Signed)
Sent. Thanks.   

## 2020-06-21 DIAGNOSIS — D649 Anemia, unspecified: Secondary | ICD-10-CM | POA: Diagnosis not present

## 2020-06-21 DIAGNOSIS — E785 Hyperlipidemia, unspecified: Secondary | ICD-10-CM | POA: Diagnosis not present

## 2020-06-21 DIAGNOSIS — I480 Paroxysmal atrial fibrillation: Secondary | ICD-10-CM | POA: Diagnosis not present

## 2020-06-21 DIAGNOSIS — I110 Hypertensive heart disease with heart failure: Secondary | ICD-10-CM | POA: Diagnosis not present

## 2020-06-21 DIAGNOSIS — I25118 Atherosclerotic heart disease of native coronary artery with other forms of angina pectoris: Secondary | ICD-10-CM | POA: Diagnosis not present

## 2020-06-21 DIAGNOSIS — I5033 Acute on chronic diastolic (congestive) heart failure: Secondary | ICD-10-CM | POA: Diagnosis not present

## 2020-06-23 ENCOUNTER — Telehealth: Payer: Self-pay | Admitting: *Deleted

## 2020-06-23 NOTE — Telephone Encounter (Signed)
Spoke to [employer] and was advised that patient has issues with swallowing but that has been going on for a while. Mitzie stated that the family is giving her fluids with a spoon and sippy cup and it appears that she is getting an adequate amount of fluids. Mitzie stated that she does have cloudy urine and sedimentation in her urine. Mitzie stated that the son refuses to treat his mom for a UTI. Mitzie stated that she does not have a fever, but does have a stage 2 sore on her coccyx that is uncomfortable but they are continuing to turn her on a regular basis. Mitzie stated that she will make the medication changes and will update Dr. Damita Dunnings next week on patient's blood pressure.

## 2020-06-23 NOTE — Telephone Encounter (Signed)
Any fever, cough, UTI symptoms or other signs of infection? How is pt feeling otherwise? Is she drinking fluids adequately? How's pulse?  Would cut losartan and isosorbide in half, update [place] with effect on blood pressures.

## 2020-06-23 NOTE — Telephone Encounter (Signed)
Mitzie nurse with [facility] called stating that she has concerns about the patient's blood pressure because it is beginning to drop. Mitzie stated that patient is on several blood pressure medications. Mitzie stated that patient is on Isosorbide, Atenolol and Digoxin. Mitzie wants to know if she should back off on some of the blood pressure medication? Mitzie stated that patients blood pressure before yesterday has been in the 150's. Mitzie stated yesterday blood pressure was 114/45 and today 108/37. Mitzie requested a call back.

## 2020-06-26 NOTE — Telephone Encounter (Signed)
Noted. Thanks.

## 2020-06-27 ENCOUNTER — Telehealth: Payer: Self-pay | Admitting: *Deleted

## 2020-06-27 ENCOUNTER — Other Ambulatory Visit: Payer: Self-pay | Admitting: Family Medicine

## 2020-06-27 MED ORDER — LOSARTAN POTASSIUM 25 MG PO TABS: 12.5000 mg | ORAL_TABLET | Freq: Every day | ORAL | Status: AC

## 2020-06-27 MED ORDER — ISOSORBIDE MONONITRATE ER 30 MG PO TB24: 15.0000 mg | ORAL_TABLET | Freq: Every day | ORAL | Status: AC

## 2020-06-27 NOTE — Telephone Encounter (Signed)
I would keep as is.  I updated med list.  Thanks.

## 2020-06-27 NOTE — Telephone Encounter (Signed)
Left detailed message on voicemail.  

## 2020-06-27 NOTE — Telephone Encounter (Signed)
Mitzi with [facility] left a voicemail stating that she had called last week about patient's blood pressure. Mitzie stated that Dr. Danise Mina had recommended that they cut her Isosorbide and Losartan to a 1/2 pill for 4 days.  Mitzie stated that they decreased the medications as instructed. Mirtzie stated that the patient's blood pressure readings were Saturday 122/49, Sunday 140/62, Monday 160/64 and today 144/58. Mitzie wants to know if they should increase the dose to the original dose or keep it the way that it is now?

## 2020-06-28 DIAGNOSIS — I5033 Acute on chronic diastolic (congestive) heart failure: Secondary | ICD-10-CM | POA: Diagnosis not present

## 2020-06-28 DIAGNOSIS — E785 Hyperlipidemia, unspecified: Secondary | ICD-10-CM | POA: Diagnosis not present

## 2020-06-28 DIAGNOSIS — I480 Paroxysmal atrial fibrillation: Secondary | ICD-10-CM | POA: Diagnosis not present

## 2020-06-28 DIAGNOSIS — I25118 Atherosclerotic heart disease of native coronary artery with other forms of angina pectoris: Secondary | ICD-10-CM | POA: Diagnosis not present

## 2020-06-28 DIAGNOSIS — D649 Anemia, unspecified: Secondary | ICD-10-CM | POA: Diagnosis not present

## 2020-06-28 DIAGNOSIS — I110 Hypertensive heart disease with heart failure: Secondary | ICD-10-CM | POA: Diagnosis not present

## 2020-06-28 NOTE — Telephone Encounter (Signed)
Patient [facility]  Ok to refill levothyroxine?

## 2020-06-28 NOTE — Telephone Encounter (Signed)
Would continue as long as she is still able to take her pills.  Prescription sent.  Thanks.

## 2020-07-03 DIAGNOSIS — D649 Anemia, unspecified: Secondary | ICD-10-CM | POA: Diagnosis not present

## 2020-07-03 DIAGNOSIS — I5033 Acute on chronic diastolic (congestive) heart failure: Secondary | ICD-10-CM | POA: Diagnosis not present

## 2020-07-03 DIAGNOSIS — I25118 Atherosclerotic heart disease of native coronary artery with other forms of angina pectoris: Secondary | ICD-10-CM | POA: Diagnosis not present

## 2020-07-03 DIAGNOSIS — I480 Paroxysmal atrial fibrillation: Secondary | ICD-10-CM | POA: Diagnosis not present

## 2020-07-03 DIAGNOSIS — I110 Hypertensive heart disease with heart failure: Secondary | ICD-10-CM | POA: Diagnosis not present

## 2020-07-03 DIAGNOSIS — E785 Hyperlipidemia, unspecified: Secondary | ICD-10-CM | POA: Diagnosis not present

## 2020-07-04 DIAGNOSIS — I5033 Acute on chronic diastolic (congestive) heart failure: Secondary | ICD-10-CM | POA: Diagnosis not present

## 2020-07-04 DIAGNOSIS — E785 Hyperlipidemia, unspecified: Secondary | ICD-10-CM | POA: Diagnosis not present

## 2020-07-04 DIAGNOSIS — I110 Hypertensive heart disease with heart failure: Secondary | ICD-10-CM | POA: Diagnosis not present

## 2020-07-04 DIAGNOSIS — I480 Paroxysmal atrial fibrillation: Secondary | ICD-10-CM | POA: Diagnosis not present

## 2020-07-04 DIAGNOSIS — I25118 Atherosclerotic heart disease of native coronary artery with other forms of angina pectoris: Secondary | ICD-10-CM | POA: Diagnosis not present

## 2020-07-04 DIAGNOSIS — D649 Anemia, unspecified: Secondary | ICD-10-CM | POA: Diagnosis not present

## 2020-07-06 DIAGNOSIS — D649 Anemia, unspecified: Secondary | ICD-10-CM | POA: Diagnosis not present

## 2020-07-06 DIAGNOSIS — E785 Hyperlipidemia, unspecified: Secondary | ICD-10-CM | POA: Diagnosis not present

## 2020-07-06 DIAGNOSIS — I5033 Acute on chronic diastolic (congestive) heart failure: Secondary | ICD-10-CM | POA: Diagnosis not present

## 2020-07-06 DIAGNOSIS — I480 Paroxysmal atrial fibrillation: Secondary | ICD-10-CM | POA: Diagnosis not present

## 2020-07-06 DIAGNOSIS — I110 Hypertensive heart disease with heart failure: Secondary | ICD-10-CM | POA: Diagnosis not present

## 2020-07-06 DIAGNOSIS — I25118 Atherosclerotic heart disease of native coronary artery with other forms of angina pectoris: Secondary | ICD-10-CM | POA: Diagnosis not present

## 2020-07-07 ENCOUNTER — Telehealth: Payer: Self-pay | Admitting: Family Medicine

## 2020-07-07 DIAGNOSIS — I25118 Atherosclerotic heart disease of native coronary artery with other forms of angina pectoris: Secondary | ICD-10-CM | POA: Diagnosis not present

## 2020-07-07 DIAGNOSIS — I5033 Acute on chronic diastolic (congestive) heart failure: Secondary | ICD-10-CM | POA: Diagnosis not present

## 2020-07-07 DIAGNOSIS — I110 Hypertensive heart disease with heart failure: Secondary | ICD-10-CM | POA: Diagnosis not present

## 2020-07-07 DIAGNOSIS — E785 Hyperlipidemia, unspecified: Secondary | ICD-10-CM | POA: Diagnosis not present

## 2020-07-07 DIAGNOSIS — D649 Anemia, unspecified: Secondary | ICD-10-CM | POA: Diagnosis not present

## 2020-07-07 DIAGNOSIS — I480 Paroxysmal atrial fibrillation: Secondary | ICD-10-CM | POA: Diagnosis not present

## 2020-07-07 NOTE — Telephone Encounter (Signed)
Pt nurse Ivin Booty called to let Dr. Damita Dunnings that she passed at 3:07 pm.

## 2020-07-07 NOTE — Telephone Encounter (Signed)
Please process the chart.  I called and LMOVM for son, w/o confidential info, to give my condolences and let them know that I would be thinking about them.  I thank all involved.

## 2020-07-10 DEATH — deceased
